# Patient Record
Sex: Female | Born: 1935
Health system: Southern US, Community
[De-identification: ages and names within clinical notes are randomized; demographics above are authoritative.]

## PROBLEM LIST (undated history)

## (undated) ENCOUNTER — Emergency Department (HOSPITAL_COMMUNITY): Admission: EM | Payer: Medicare Other | Source: Home / Self Care

## (undated) DIAGNOSIS — M810 Age-related osteoporosis without current pathological fracture: Secondary | ICD-10-CM

## (undated) DIAGNOSIS — I4891 Unspecified atrial fibrillation: Secondary | ICD-10-CM

## (undated) DIAGNOSIS — C50919 Malignant neoplasm of unspecified site of unspecified female breast: Secondary | ICD-10-CM

## (undated) DIAGNOSIS — R011 Cardiac murmur, unspecified: Secondary | ICD-10-CM

## (undated) DIAGNOSIS — I10 Essential (primary) hypertension: Secondary | ICD-10-CM

## (undated) DIAGNOSIS — Z9289 Personal history of other medical treatment: Secondary | ICD-10-CM

## (undated) DIAGNOSIS — M1990 Unspecified osteoarthritis, unspecified site: Secondary | ICD-10-CM

## (undated) DIAGNOSIS — J449 Chronic obstructive pulmonary disease, unspecified: Secondary | ICD-10-CM

## (undated) DIAGNOSIS — M4302 Spondylolysis, cervical region: Secondary | ICD-10-CM

## (undated) DIAGNOSIS — I341 Nonrheumatic mitral (valve) prolapse: Secondary | ICD-10-CM

## (undated) DIAGNOSIS — IMO0002 Reserved for concepts with insufficient information to code with codable children: Secondary | ICD-10-CM

## (undated) DIAGNOSIS — M419 Scoliosis, unspecified: Secondary | ICD-10-CM

## (undated) DIAGNOSIS — M545 Low back pain, unspecified: Secondary | ICD-10-CM

## (undated) DIAGNOSIS — E785 Hyperlipidemia, unspecified: Secondary | ICD-10-CM

## (undated) DIAGNOSIS — R0602 Shortness of breath: Secondary | ICD-10-CM

## (undated) DIAGNOSIS — G8929 Other chronic pain: Secondary | ICD-10-CM

## (undated) HISTORY — PX: HERNIA REPAIR: SHX51

## (undated) HISTORY — PX: BREAST BIOPSY: SHX20

## (undated) SURGERY — Surgical Case
Anesthesia: *Unknown

---

## 1998-09-23 HISTORY — PX: CATARACT EXTRACTION W/ INTRAOCULAR LENS IMPLANT: SHX1309

## 1998-11-23 HISTORY — PX: CARDIAC CATHETERIZATION: SHX172

## 1998-12-14 ENCOUNTER — Encounter: Payer: Self-pay | Admitting: Cardiovascular Disease

## 1998-12-14 ENCOUNTER — Inpatient Hospital Stay (HOSPITAL_COMMUNITY): Admission: AD | Admit: 1998-12-14 | Discharge: 1998-12-16 | Payer: Self-pay | Admitting: Cardiovascular Disease

## 2002-02-25 ENCOUNTER — Emergency Department (HOSPITAL_COMMUNITY): Admission: EM | Admit: 2002-02-25 | Discharge: 2002-02-26 | Payer: Self-pay | Admitting: Emergency Medicine

## 2004-03-26 ENCOUNTER — Encounter: Admission: RE | Admit: 2004-03-26 | Discharge: 2004-03-26 | Payer: Self-pay | Admitting: Internal Medicine

## 2004-03-29 ENCOUNTER — Emergency Department (HOSPITAL_COMMUNITY): Admission: EM | Admit: 2004-03-29 | Discharge: 2004-03-29 | Payer: Self-pay | Admitting: Unknown Physician Specialty

## 2004-04-20 ENCOUNTER — Encounter: Admission: RE | Admit: 2004-04-20 | Discharge: 2004-04-20 | Payer: Self-pay | Admitting: Orthopaedic Surgery

## 2004-05-04 ENCOUNTER — Encounter: Admission: RE | Admit: 2004-05-04 | Discharge: 2004-05-04 | Payer: Self-pay | Admitting: Orthopaedic Surgery

## 2004-05-05 ENCOUNTER — Encounter: Admission: RE | Admit: 2004-05-05 | Discharge: 2004-05-05 | Payer: Self-pay | Admitting: Orthopaedic Surgery

## 2004-05-29 ENCOUNTER — Encounter: Admission: RE | Admit: 2004-05-29 | Discharge: 2004-05-29 | Payer: Self-pay | Admitting: Orthopaedic Surgery

## 2004-05-31 ENCOUNTER — Encounter: Admission: RE | Admit: 2004-05-31 | Discharge: 2004-06-23 | Payer: Self-pay | Admitting: Orthopaedic Surgery

## 2004-06-13 ENCOUNTER — Encounter: Admission: RE | Admit: 2004-06-13 | Discharge: 2004-06-13 | Payer: Self-pay | Admitting: Orthopaedic Surgery

## 2004-07-05 ENCOUNTER — Encounter: Admission: RE | Admit: 2004-07-05 | Discharge: 2004-07-05 | Payer: Self-pay | Admitting: Orthopaedic Surgery

## 2005-02-16 ENCOUNTER — Encounter: Admission: RE | Admit: 2005-02-16 | Discharge: 2005-02-16 | Payer: Self-pay | Admitting: Internal Medicine

## 2005-03-07 ENCOUNTER — Encounter (INDEPENDENT_AMBULATORY_CARE_PROVIDER_SITE_OTHER): Payer: Self-pay | Admitting: Diagnostic Radiology

## 2005-03-07 ENCOUNTER — Encounter (INDEPENDENT_AMBULATORY_CARE_PROVIDER_SITE_OTHER): Payer: Self-pay | Admitting: *Deleted

## 2005-03-07 ENCOUNTER — Encounter: Admission: RE | Admit: 2005-03-07 | Discharge: 2005-03-07 | Payer: Self-pay | Admitting: Internal Medicine

## 2005-03-21 ENCOUNTER — Encounter: Admission: RE | Admit: 2005-03-21 | Discharge: 2005-03-21 | Payer: Self-pay | Admitting: Surgery

## 2005-03-23 ENCOUNTER — Ambulatory Visit: Payer: Self-pay | Admitting: Oncology

## 2005-05-10 ENCOUNTER — Ambulatory Visit (HOSPITAL_COMMUNITY): Admission: RE | Admit: 2005-05-10 | Discharge: 2005-05-10 | Payer: Self-pay | Admitting: Oncology

## 2005-05-16 ENCOUNTER — Ambulatory Visit: Payer: Self-pay | Admitting: Oncology

## 2005-05-17 LAB — COMPREHENSIVE METABOLIC PANEL
AST: 18 U/L (ref 0–37)
Albumin: 4.4 g/dL (ref 3.5–5.2)
Alkaline Phosphatase: 51 U/L (ref 39–117)
BUN: 18 mg/dL (ref 6–23)
Creatinine, Ser: 0.9 mg/dL (ref 0.4–1.2)
Glucose, Bld: 97 mg/dL (ref 70–99)
Potassium: 4.4 mEq/L (ref 3.5–5.3)
Total Bilirubin: 0.5 mg/dL (ref 0.3–1.2)

## 2005-05-17 LAB — CBC WITH DIFFERENTIAL/PLATELET
Basophils Absolute: 0.1 10*3/uL (ref 0.0–0.1)
EOS%: 5.9 % (ref 0.0–7.0)
Eosinophils Absolute: 0.3 10*3/uL (ref 0.0–0.5)
HCT: 37.5 % (ref 34.8–46.6)
HGB: 12.7 g/dL (ref 11.6–15.9)
LYMPH%: 21.1 % (ref 14.0–48.0)
MCH: 33.8 pg (ref 26.0–34.0)
MCV: 99.8 fL (ref 81.0–101.0)
MONO%: 9.8 % (ref 0.0–13.0)
NEUT#: 3.1 10*3/uL (ref 1.5–6.5)
NEUT%: 61.9 % (ref 39.6–76.8)
Platelets: 270 10*3/uL (ref 145–400)

## 2005-05-17 LAB — CANCER ANTIGEN 27.29: CA 27.29: 9 U/mL (ref 0–39)

## 2005-07-10 ENCOUNTER — Ambulatory Visit: Payer: Self-pay | Admitting: Oncology

## 2005-07-12 LAB — CBC WITH DIFFERENTIAL/PLATELET
BASO%: 0.8 % (ref 0.0–2.0)
Eosinophils Absolute: 0.2 10*3/uL (ref 0.0–0.5)
MCHC: 34.5 g/dL (ref 32.0–36.0)
MONO#: 0.5 10*3/uL (ref 0.1–0.9)
NEUT#: 3.3 10*3/uL (ref 1.5–6.5)
RBC: 3.68 10*6/uL — ABNORMAL LOW (ref 3.70–5.32)
RDW: 13.1 % (ref 11.3–14.5)
WBC: 5.1 10*3/uL (ref 3.9–10.0)
lymph#: 0.9 10*3/uL (ref 0.9–3.3)

## 2005-07-12 LAB — COMPREHENSIVE METABOLIC PANEL
ALT: 12 U/L (ref 0–40)
Albumin: 4.5 g/dL (ref 3.5–5.2)
CO2: 30 mEq/L (ref 19–32)
Calcium: 9.6 mg/dL (ref 8.4–10.5)
Chloride: 100 mEq/L (ref 96–112)
Glucose, Bld: 95 mg/dL (ref 70–99)
Potassium: 4.5 mEq/L (ref 3.5–5.3)
Sodium: 138 mEq/L (ref 135–145)
Total Bilirubin: 0.6 mg/dL (ref 0.3–1.2)
Total Protein: 6.8 g/dL (ref 6.0–8.3)

## 2005-07-12 LAB — LACTATE DEHYDROGENASE: LDH: 159 U/L (ref 94–250)

## 2005-07-19 ENCOUNTER — Encounter: Admission: RE | Admit: 2005-07-19 | Discharge: 2005-07-19 | Payer: Self-pay | Admitting: General Surgery

## 2005-10-09 ENCOUNTER — Ambulatory Visit: Payer: Self-pay | Admitting: Oncology

## 2005-10-11 LAB — COMPREHENSIVE METABOLIC PANEL
Albumin: 4.7 g/dL (ref 3.5–5.2)
Alkaline Phosphatase: 53 U/L (ref 39–117)
BUN: 16 mg/dL (ref 6–23)
CO2: 29 mEq/L (ref 19–32)
Glucose, Bld: 102 mg/dL — ABNORMAL HIGH (ref 70–99)
Potassium: 4.6 mEq/L (ref 3.5–5.3)
Sodium: 138 mEq/L (ref 135–145)
Total Bilirubin: 0.5 mg/dL (ref 0.3–1.2)
Total Protein: 6.8 g/dL (ref 6.0–8.3)

## 2005-10-11 LAB — CBC WITH DIFFERENTIAL/PLATELET
BASO%: 1.1 % (ref 0.0–2.0)
EOS%: 5.9 % (ref 0.0–7.0)
HCT: 37.3 % (ref 34.8–46.6)
LYMPH%: 19.4 % (ref 14.0–48.0)
MCH: 34.2 pg — ABNORMAL HIGH (ref 26.0–34.0)
MCHC: 34.3 g/dL (ref 32.0–36.0)
MONO#: 0.4 10*3/uL (ref 0.1–0.9)
MONO%: 8.2 % (ref 0.0–13.0)
NEUT%: 65.4 % (ref 39.6–76.8)
Platelets: 283 10*3/uL (ref 145–400)
RBC: 3.74 10*6/uL (ref 3.70–5.32)
WBC: 5.4 10*3/uL (ref 3.9–10.0)

## 2005-10-11 LAB — CANCER ANTIGEN 27.29: CA 27.29: <4 U/mL (ref 0–39)

## 2005-10-11 LAB — LACTATE DEHYDROGENASE: LDH: 155 U/L (ref 94–250)

## 2006-01-01 ENCOUNTER — Ambulatory Visit: Payer: Self-pay | Admitting: Oncology

## 2006-01-03 LAB — CBC WITH DIFFERENTIAL/PLATELET
Basophils Absolute: 0 10*3/uL (ref 0.0–0.1)
Eosinophils Absolute: 0.1 10*3/uL (ref 0.0–0.5)
HGB: 12.4 g/dL (ref 11.6–15.9)
NEUT#: 3.7 10*3/uL (ref 1.5–6.5)
RBC: 3.69 10*6/uL — ABNORMAL LOW (ref 3.70–5.32)
RDW: 13 % (ref 11.3–14.5)
WBC: 5.8 10*3/uL (ref 3.9–10.0)
lymph#: 1.4 10*3/uL (ref 0.9–3.3)

## 2006-01-03 LAB — COMPREHENSIVE METABOLIC PANEL
AST: 18 U/L (ref 0–37)
Albumin: 4.7 g/dL (ref 3.5–5.2)
Alkaline Phosphatase: 68 U/L (ref 39–117)
BUN: 18 mg/dL (ref 6–23)
Calcium: 9.4 mg/dL (ref 8.4–10.5)
Chloride: 102 mEq/L (ref 96–112)
Glucose, Bld: 91 mg/dL (ref 70–99)
Potassium: 4.4 mEq/L (ref 3.5–5.3)
Sodium: 140 mEq/L (ref 135–145)
Total Protein: 6.7 g/dL (ref 6.0–8.3)

## 2006-02-18 ENCOUNTER — Encounter: Admission: RE | Admit: 2006-02-18 | Discharge: 2006-02-18 | Payer: Self-pay | Admitting: Oncology

## 2006-04-01 ENCOUNTER — Ambulatory Visit: Payer: Self-pay | Admitting: Oncology

## 2006-04-03 LAB — CBC WITH DIFFERENTIAL/PLATELET
BASO%: 0.7 % (ref 0.0–2.0)
Eosinophils Absolute: 0 10*3/uL (ref 0.0–0.5)
HCT: 36.2 % (ref 34.8–46.6)
MCHC: 34.7 g/dL (ref 32.0–36.0)
MONO#: 0.5 10*3/uL (ref 0.1–0.9)
NEUT#: 3 10*3/uL (ref 1.5–6.5)
NEUT%: 66.3 % (ref 39.6–76.8)
Platelets: 212 10*3/uL (ref 145–400)
WBC: 4.5 10*3/uL (ref 3.9–10.0)
lymph#: 0.9 10*3/uL (ref 0.9–3.3)

## 2006-04-05 LAB — COMPREHENSIVE METABOLIC PANEL
ALT: 13 U/L (ref 0–35)
CO2: 30 mEq/L (ref 19–32)
Calcium: 9.3 mg/dL (ref 8.4–10.5)
Chloride: 103 mEq/L (ref 96–112)
Creatinine, Ser: 0.85 mg/dL (ref 0.40–1.20)
Glucose, Bld: 93 mg/dL (ref 70–99)
Sodium: 142 mEq/L (ref 135–145)
Total Protein: 7 g/dL (ref 6.0–8.3)

## 2006-08-05 ENCOUNTER — Ambulatory Visit: Payer: Self-pay | Admitting: Oncology

## 2006-08-07 LAB — CBC WITH DIFFERENTIAL/PLATELET
BASO%: 0.3 % (ref 0.0–2.0)
Basophils Absolute: 0 10*3/uL (ref 0.0–0.1)
EOS%: 1.5 % (ref 0.0–7.0)
HCT: 36.6 % (ref 34.8–46.6)
LYMPH%: 20.6 % (ref 14.0–48.0)
MCH: 34.3 pg — ABNORMAL HIGH (ref 26.0–34.0)
MCHC: 35.3 g/dL (ref 32.0–36.0)
NEUT%: 70 % (ref 39.6–76.8)
Platelets: 219 10*3/uL (ref 145–400)

## 2006-08-07 LAB — LACTATE DEHYDROGENASE: LDH: 167 U/L (ref 94–250)

## 2006-08-07 LAB — COMPREHENSIVE METABOLIC PANEL
AST: 17 U/L (ref 0–37)
Alkaline Phosphatase: 55 U/L (ref 39–117)
BUN: 14 mg/dL (ref 6–23)
Calcium: 9.6 mg/dL (ref 8.4–10.5)
Chloride: 96 mEq/L (ref 96–112)
Creatinine, Ser: 0.85 mg/dL (ref 0.40–1.20)
Glucose, Bld: 91 mg/dL (ref 70–99)

## 2006-09-24 ENCOUNTER — Encounter: Admission: RE | Admit: 2006-09-24 | Discharge: 2006-09-24 | Payer: Self-pay | Admitting: Internal Medicine

## 2006-11-27 ENCOUNTER — Ambulatory Visit: Payer: Self-pay | Admitting: Oncology

## 2006-12-09 ENCOUNTER — Inpatient Hospital Stay (HOSPITAL_COMMUNITY): Admission: EM | Admit: 2006-12-09 | Discharge: 2006-12-16 | Payer: Self-pay | Admitting: Emergency Medicine

## 2006-12-10 ENCOUNTER — Encounter (INDEPENDENT_AMBULATORY_CARE_PROVIDER_SITE_OTHER): Payer: Self-pay | Admitting: Gastroenterology

## 2006-12-26 LAB — COMPREHENSIVE METABOLIC PANEL
ALT: 10 U/L (ref 0–35)
AST: 16 U/L (ref 0–37)
Albumin: 4.3 g/dL (ref 3.5–5.2)
Calcium: 10 mg/dL (ref 8.4–10.5)
Chloride: 100 mEq/L (ref 96–112)
Potassium: 4.1 mEq/L (ref 3.5–5.3)
Total Protein: 6.8 g/dL (ref 6.0–8.3)

## 2006-12-26 LAB — CBC WITH DIFFERENTIAL/PLATELET
BASO%: 0.4 % (ref 0.0–2.0)
EOS%: 1.7 % (ref 0.0–7.0)
HGB: 12.3 g/dL (ref 11.6–15.9)
MCH: 33.7 pg (ref 26.0–34.0)
MCHC: 34.4 g/dL (ref 32.0–36.0)
MONO#: 0.5 10*3/uL (ref 0.1–0.9)
RDW: 14.1 % (ref 11.3–14.5)
WBC: 6.1 10*3/uL (ref 3.9–10.0)
lymph#: 0.9 10*3/uL (ref 0.9–3.3)

## 2007-02-20 ENCOUNTER — Encounter: Admission: RE | Admit: 2007-02-20 | Discharge: 2007-02-20 | Payer: Self-pay | Admitting: Oncology

## 2007-02-25 ENCOUNTER — Ambulatory Visit: Payer: Self-pay | Admitting: Oncology

## 2007-02-27 LAB — CBC WITH DIFFERENTIAL/PLATELET
Basophils Absolute: 0.1 10*3/uL (ref 0.0–0.1)
EOS%: 2.7 % (ref 0.0–7.0)
HCT: 37.5 % (ref 34.8–46.6)
HGB: 13.1 g/dL (ref 11.6–15.9)
MCH: 33.8 pg (ref 26.0–34.0)
MCV: 96.6 fL (ref 81.0–101.0)
MONO%: 10.2 % (ref 0.0–13.0)
NEUT%: 62.8 % (ref 39.6–76.8)
Platelets: 238 10*3/uL (ref 145–400)
lymph#: 1.3 10*3/uL (ref 0.9–3.3)

## 2007-02-28 LAB — COMPREHENSIVE METABOLIC PANEL
ALT: 9 U/L (ref 0–35)
AST: 16 U/L (ref 0–37)
Albumin: 5 g/dL (ref 3.5–5.2)
Alkaline Phosphatase: 71 U/L (ref 39–117)
Potassium: 3.9 mEq/L (ref 3.5–5.3)
Sodium: 136 mEq/L (ref 135–145)
Total Protein: 7.3 g/dL (ref 6.0–8.3)

## 2007-02-28 LAB — VITAMIN D 25 HYDROXY (VIT D DEFICIENCY, FRACTURES): Vit D, 25-Hydroxy: 40 ng/mL (ref 30–89)

## 2007-05-19 ENCOUNTER — Ambulatory Visit: Payer: Self-pay | Admitting: Oncology

## 2007-05-21 LAB — CBC WITH DIFFERENTIAL/PLATELET
EOS%: 4.9 % (ref 0.0–7.0)
Eosinophils Absolute: 0.3 10*3/uL (ref 0.0–0.5)
MCH: 33.4 pg (ref 26.0–34.0)
MCV: 95.8 fL (ref 81.0–101.0)
MONO%: 10.4 % (ref 0.0–13.0)
NEUT#: 3.1 10*3/uL (ref 1.5–6.5)
RBC: 3.65 10*6/uL — ABNORMAL LOW (ref 3.70–5.32)
RDW: 13.5 % (ref 11.3–14.5)

## 2007-05-22 LAB — COMPREHENSIVE METABOLIC PANEL
ALT: 11 U/L (ref 0–35)
AST: 18 U/L (ref 0–37)
Albumin: 4.3 g/dL (ref 3.5–5.2)
Alkaline Phosphatase: 76 U/L (ref 39–117)
Potassium: 4.2 mEq/L (ref 3.5–5.3)
Sodium: 139 mEq/L (ref 135–145)
Total Protein: 6.8 g/dL (ref 6.0–8.3)

## 2007-10-10 ENCOUNTER — Ambulatory Visit: Payer: Self-pay | Admitting: Oncology

## 2007-10-14 LAB — CBC WITH DIFFERENTIAL/PLATELET
BASO%: 0.6 % (ref 0.0–2.0)
EOS%: 3.6 % (ref 0.0–7.0)
HCT: 38.9 % (ref 34.8–46.6)
LYMPH%: 17.7 % (ref 14.0–48.0)
MCH: 34.7 pg — ABNORMAL HIGH (ref 26.0–34.0)
MCHC: 34.4 g/dL (ref 32.0–36.0)
MONO#: 0.4 10*3/uL (ref 0.1–0.9)
NEUT%: 70.3 % (ref 39.6–76.8)
Platelets: 225 10*3/uL (ref 145–400)
RBC: 3.86 10*6/uL (ref 3.70–5.32)
WBC: 5.7 10*3/uL (ref 3.9–10.0)

## 2007-10-15 LAB — COMPREHENSIVE METABOLIC PANEL
ALT: 13 U/L (ref 0–35)
AST: 17 U/L (ref 0–37)
Alkaline Phosphatase: 79 U/L (ref 39–117)
CO2: 28 mEq/L (ref 19–32)
Creatinine, Ser: 0.85 mg/dL (ref 0.40–1.20)
Sodium: 138 mEq/L (ref 135–145)
Total Bilirubin: 0.8 mg/dL (ref 0.3–1.2)
Total Protein: 7.3 g/dL (ref 6.0–8.3)

## 2008-02-23 ENCOUNTER — Ambulatory Visit: Payer: Self-pay | Admitting: Oncology

## 2008-02-25 LAB — CBC WITH DIFFERENTIAL/PLATELET
Basophils Absolute: 0 10*3/uL (ref 0.0–0.1)
Eosinophils Absolute: 0.3 10*3/uL (ref 0.0–0.5)
HCT: 36.6 % (ref 34.8–46.6)
HGB: 12.6 g/dL (ref 11.6–15.9)
MONO#: 0.4 10*3/uL (ref 0.1–0.9)
NEUT#: 3.4 10*3/uL (ref 1.5–6.5)
NEUT%: 67.1 % (ref 39.6–76.8)
RDW: 13.2 % (ref 11.3–14.5)
WBC: 5 10*3/uL (ref 3.9–10.0)
lymph#: 0.9 10*3/uL (ref 0.9–3.3)

## 2008-02-26 ENCOUNTER — Encounter: Admission: RE | Admit: 2008-02-26 | Discharge: 2008-02-26 | Payer: Self-pay | Admitting: Oncology

## 2008-02-26 LAB — COMPREHENSIVE METABOLIC PANEL
ALT: 14 U/L (ref 0–35)
AST: 18 U/L (ref 0–37)
Albumin: 4.6 g/dL (ref 3.5–5.2)
BUN: 21 mg/dL (ref 6–23)
CO2: 26 mEq/L (ref 19–32)
Calcium: 9.9 mg/dL (ref 8.4–10.5)
Chloride: 100 mEq/L (ref 96–112)
Creatinine, Ser: 0.97 mg/dL (ref 0.40–1.20)
Potassium: 4 mEq/L (ref 3.5–5.3)

## 2008-02-26 LAB — VITAMIN D 25 HYDROXY (VIT D DEFICIENCY, FRACTURES): Vit D, 25-Hydroxy: 29 ng/mL — ABNORMAL LOW (ref 30–89)

## 2008-02-26 LAB — CANCER ANTIGEN 27.29: CA 27.29: 15 U/mL (ref 0–39)

## 2008-09-08 ENCOUNTER — Ambulatory Visit: Payer: Self-pay | Admitting: Oncology

## 2008-09-10 LAB — CBC WITH DIFFERENTIAL/PLATELET
Basophils Absolute: 0.1 10*3/uL (ref 0.0–0.1)
EOS%: 6.4 % (ref 0.0–7.0)
HGB: 12.1 g/dL (ref 11.6–15.9)
LYMPH%: 19.6 % (ref 14.0–49.7)
MCH: 34.7 pg — ABNORMAL HIGH (ref 25.1–34.0)
MCV: 102.2 fL — ABNORMAL HIGH (ref 79.5–101.0)
MONO%: 9.8 % (ref 0.0–14.0)
Platelets: 181 10*3/uL (ref 145–400)
RDW: 13.1 % (ref 11.2–14.5)

## 2008-09-14 LAB — COMPREHENSIVE METABOLIC PANEL
AST: 22 U/L (ref 0–37)
Albumin: 4.6 g/dL (ref 3.5–5.2)
Alkaline Phosphatase: 64 U/L (ref 39–117)
BUN: 17 mg/dL (ref 6–23)
Creatinine, Ser: 1.01 mg/dL (ref 0.40–1.20)
Potassium: 3.7 mEq/L (ref 3.5–5.3)
Total Bilirubin: 0.6 mg/dL (ref 0.3–1.2)

## 2008-09-14 LAB — VITAMIN D 25 HYDROXY (VIT D DEFICIENCY, FRACTURES): Vit D, 25-Hydroxy: 18 ng/mL — ABNORMAL LOW (ref 30–89)

## 2008-09-14 LAB — CANCER ANTIGEN 27.29: CA 27.29: 13 U/mL (ref 0–39)

## 2009-02-28 ENCOUNTER — Encounter: Admission: RE | Admit: 2009-02-28 | Discharge: 2009-02-28 | Payer: Self-pay | Admitting: Oncology

## 2009-03-15 ENCOUNTER — Ambulatory Visit: Payer: Self-pay | Admitting: Oncology

## 2009-03-17 LAB — CBC WITH DIFFERENTIAL/PLATELET
BASO%: 0.9 % (ref 0.0–2.0)
Eosinophils Absolute: 0.4 10*3/uL (ref 0.0–0.5)
MCHC: 34 g/dL (ref 31.5–36.0)
MONO#: 0.5 10*3/uL (ref 0.1–0.9)
NEUT#: 3.6 10*3/uL (ref 1.5–6.5)
RBC: 3.67 10*6/uL — ABNORMAL LOW (ref 3.70–5.45)
RDW: 13.4 % (ref 11.2–14.5)
WBC: 5.6 10*3/uL (ref 3.9–10.3)
lymph#: 1.1 10*3/uL (ref 0.9–3.3)

## 2009-03-17 LAB — COMPREHENSIVE METABOLIC PANEL
ALT: 14 U/L (ref 0–35)
Albumin: 4.9 g/dL (ref 3.5–5.2)
CO2: 28 mEq/L (ref 19–32)
Chloride: 98 mEq/L (ref 96–112)
Glucose, Bld: 99 mg/dL (ref 70–99)
Potassium: 4.4 mEq/L (ref 3.5–5.3)
Sodium: 136 mEq/L (ref 135–145)
Total Bilirubin: 0.7 mg/dL (ref 0.3–1.2)
Total Protein: 7.3 g/dL (ref 6.0–8.3)

## 2009-03-17 LAB — LACTATE DEHYDROGENASE: LDH: 165 U/L (ref 94–250)

## 2009-04-20 ENCOUNTER — Emergency Department (HOSPITAL_COMMUNITY): Admission: EM | Admit: 2009-04-20 | Discharge: 2009-04-20 | Payer: Self-pay | Admitting: Emergency Medicine

## 2009-09-22 ENCOUNTER — Ambulatory Visit: Payer: Self-pay | Admitting: Oncology

## 2009-09-27 LAB — COMPREHENSIVE METABOLIC PANEL
AST: 22 U/L (ref 0–37)
Albumin: 4.5 g/dL (ref 3.5–5.2)
BUN: 15 mg/dL (ref 6–23)
Calcium: 9.9 mg/dL (ref 8.4–10.5)
Chloride: 92 mEq/L — ABNORMAL LOW (ref 96–112)
Creatinine, Ser: 1.02 mg/dL (ref 0.40–1.20)
Glucose, Bld: 93 mg/dL (ref 70–99)
Potassium: 3.9 mEq/L (ref 3.5–5.3)

## 2009-09-27 LAB — CBC WITH DIFFERENTIAL/PLATELET
Basophils Absolute: 0 10*3/uL (ref 0.0–0.1)
EOS%: 2 % (ref 0.0–7.0)
Eosinophils Absolute: 0.1 10*3/uL (ref 0.0–0.5)
HCT: 38.5 % (ref 34.8–46.6)
HGB: 13 g/dL (ref 11.6–15.9)
MCH: 34 pg (ref 25.1–34.0)
MCV: 100.7 fL (ref 79.5–101.0)
MONO%: 9.1 % (ref 0.0–14.0)
NEUT#: 4.4 10*3/uL (ref 1.5–6.5)
NEUT%: 70 % (ref 38.4–76.8)
RDW: 14 % (ref 11.2–14.5)
lymph#: 1.2 10*3/uL (ref 0.9–3.3)

## 2009-09-27 LAB — VITAMIN D 25 HYDROXY (VIT D DEFICIENCY, FRACTURES): Vit D, 25-Hydroxy: 50 ng/mL (ref 30–89)

## 2009-10-03 ENCOUNTER — Encounter: Admission: RE | Admit: 2009-10-03 | Discharge: 2009-10-03 | Payer: Self-pay | Admitting: Oncology

## 2010-01-10 ENCOUNTER — Observation Stay (HOSPITAL_COMMUNITY)
Admission: EM | Admit: 2010-01-10 | Discharge: 2010-01-11 | Payer: Self-pay | Source: Home / Self Care | Attending: Internal Medicine | Admitting: Internal Medicine

## 2010-02-11 ENCOUNTER — Other Ambulatory Visit: Payer: Self-pay | Admitting: Oncology

## 2010-02-11 DIAGNOSIS — Z9889 Other specified postprocedural states: Secondary | ICD-10-CM

## 2010-02-11 DIAGNOSIS — N6320 Unspecified lump in the left breast, unspecified quadrant: Secondary | ICD-10-CM

## 2010-03-01 ENCOUNTER — Other Ambulatory Visit: Payer: Self-pay

## 2010-03-01 ENCOUNTER — Ambulatory Visit
Admission: RE | Admit: 2010-03-01 | Discharge: 2010-03-01 | Disposition: A | Discharge status: Home / Self Care | Payer: Medicare Other | Source: Ambulatory Visit | Attending: Oncology | Admitting: Oncology

## 2010-03-01 DIAGNOSIS — N6320 Unspecified lump in the left breast, unspecified quadrant: Secondary | ICD-10-CM

## 2010-03-01 DIAGNOSIS — Z9889 Other specified postprocedural states: Secondary | ICD-10-CM

## 2010-04-03 LAB — URINE MICROSCOPIC-ADD ON

## 2010-04-03 LAB — DIFFERENTIAL
Basophils Absolute: 0 10*3/uL (ref 0.0–0.1)
Lymphocytes Relative: 13 % (ref 12–46)
Neutro Abs: 4.7 10*3/uL (ref 1.7–7.7)

## 2010-04-03 LAB — CK TOTAL AND CKMB (NOT AT ARMC)
CK, MB: 2.1 ng/mL (ref 0.3–4.0)
Total CK: 54 U/L (ref 7–177)

## 2010-04-03 LAB — BASIC METABOLIC PANEL
Calcium: 10.8 mg/dL — ABNORMAL HIGH (ref 8.4–10.5)
Calcium: 8.9 mg/dL (ref 8.4–10.5)
Chloride: 89 mEq/L — ABNORMAL LOW (ref 96–112)
Creatinine, Ser: 0.89 mg/dL (ref 0.4–1.2)
Creatinine, Ser: 0.92 mg/dL (ref 0.4–1.2)
Sodium: 131 mEq/L — ABNORMAL LOW (ref 135–145)

## 2010-04-03 LAB — URINALYSIS, ROUTINE W REFLEX MICROSCOPIC
Bilirubin Urine: NEGATIVE
Glucose, UA: NEGATIVE mg/dL
Protein, ur: NEGATIVE mg/dL

## 2010-04-03 LAB — CBC
MCV: 99.7 fL (ref 78.0–100.0)
Platelets: 166 10*3/uL (ref 150–400)
Platelets: 213 10*3/uL (ref 150–400)
RDW: 12.3 % (ref 11.5–15.5)
RDW: 12.3 % (ref 11.5–15.5)
WBC: 4.3 10*3/uL (ref 4.0–10.5)
WBC: 6.1 10*3/uL (ref 4.0–10.5)

## 2010-04-03 LAB — TROPONIN I

## 2010-04-14 ENCOUNTER — Other Ambulatory Visit: Payer: Self-pay | Admitting: Oncology

## 2010-04-14 ENCOUNTER — Encounter (HOSPITAL_BASED_OUTPATIENT_CLINIC_OR_DEPARTMENT_OTHER): Payer: Medicare Other | Admitting: Oncology

## 2010-04-14 DIAGNOSIS — Z853 Personal history of malignant neoplasm of breast: Secondary | ICD-10-CM

## 2010-04-14 DIAGNOSIS — C50419 Malignant neoplasm of upper-outer quadrant of unspecified female breast: Secondary | ICD-10-CM

## 2010-04-14 LAB — CBC WITH DIFFERENTIAL/PLATELET
BASO%: 0.5 % (ref 0.0–2.0)
Basophils Absolute: 0 10*3/uL (ref 0.0–0.1)
EOS%: 2.5 % (ref 0.0–7.0)
HGB: 12.7 g/dL (ref 11.6–15.9)
MCH: 33.5 pg (ref 25.1–34.0)
NEUT#: 4.6 10*3/uL (ref 1.5–6.5)
RDW: 13.6 % (ref 11.2–14.5)
lymph#: 1.3 10*3/uL (ref 0.9–3.3)

## 2010-04-15 LAB — COMPREHENSIVE METABOLIC PANEL
ALT: 14 U/L (ref 0–35)
AST: 24 U/L (ref 0–37)
Albumin: 4.7 g/dL (ref 3.5–5.2)
BUN: 15 mg/dL (ref 6–23)
Calcium: 10.1 mg/dL (ref 8.4–10.5)
Chloride: 96 mEq/L (ref 96–112)
Potassium: 3.7 mEq/L (ref 3.5–5.3)
Sodium: 137 mEq/L (ref 135–145)
Total Protein: 7.3 g/dL (ref 6.0–8.3)

## 2010-04-16 LAB — CBC
Hemoglobin: 13.1 g/dL (ref 12.0–15.0)
MCHC: 34 g/dL (ref 30.0–36.0)
RDW: 13.4 % (ref 11.5–15.5)

## 2010-04-16 LAB — DIFFERENTIAL
Lymphs Abs: 0.9 10*3/uL (ref 0.7–4.0)
Monocytes Relative: 9 % (ref 3–12)
Neutro Abs: 9 10*3/uL — ABNORMAL HIGH (ref 1.7–7.7)
Neutrophils Relative %: 82 % — ABNORMAL HIGH (ref 43–77)

## 2010-04-16 LAB — URINALYSIS, ROUTINE W REFLEX MICROSCOPIC
Glucose, UA: NEGATIVE mg/dL
Specific Gravity, Urine: 1.012 (ref 1.005–1.030)
pH: 7.5 (ref 5.0–8.0)

## 2010-04-16 LAB — COMPREHENSIVE METABOLIC PANEL
ALT: 17 U/L (ref 0–35)
BUN: 13 mg/dL (ref 6–23)
Calcium: 9.7 mg/dL (ref 8.4–10.5)
Glucose, Bld: 125 mg/dL — ABNORMAL HIGH (ref 70–99)
Sodium: 132 mEq/L — ABNORMAL LOW (ref 135–145)
Total Protein: 7.5 g/dL (ref 6.0–8.3)

## 2010-04-16 LAB — CULTURE, BLOOD (ROUTINE X 2)
Culture: NO GROWTH
Culture: NO GROWTH

## 2010-04-16 LAB — URINE CULTURE

## 2010-04-16 LAB — URINE MICROSCOPIC-ADD ON

## 2010-06-06 NOTE — H&P (Signed)
Holly Weaver, GLASBY NO.:  [id number]   MEDICAL RECORD NO.:  [id number]          PATIENT TYPE:  EMS   LOCATION:  ED                           FACILITY:  [facility]   PHYSICIAN:  Janalyn Rouse, M.D.  DATE OF BIRTH:  1923-09-27   DATE OF ADMISSION:  12/09/2006  DATE OF DISCHARGE:                              HISTORY & PHYSICAL   CHIEF COMPLAINT:  Bright red blood per rectum.   HISTORY OF PRESENT ILLNESS:  Holly Weaver is an 77 year old white female  with a history of breast cancer on Femara therapy, osteoporosis with  multiple vertebral fractures resulting in chronic back pain who  presented to the emergency department today with a complaint of bright  red blood per rectum for less than 12 hours.  The patient states that  she has had increasing back pain recently due to compression fractures  and has been treated with increasing Darvocet usage.  This morning,  after awakening, she had crampy lower abdominal pain and constipation.  She took milk of magnesia with resulting hard stool for which she had to  strain.  This was followed by an even larger loose stool and then  several loose bowel movements with bloody clots throughout the day.  She  called our office and presented to the emergency department where she  was found to have heme-positive stool with bright red blood.  She does  not have any prior history of GI bleed and has not had any recent  significant GI issues.  Of note, she has never had a colonoscopy despite  her family history of colon cancer due to her recent issues including  her back pain and breast cancer.  She has not felt that she could lay  flat for this exam.   REVIEW OF SYSTEMS:  All systems were reviewed with the patient and were  negative except in the HPI with the following exceptions:  She has lost  a good bit of weight recently, but this has stabilized since September  of 2008.  Prior to that, she lost about 12 pounds.  No fevers, chills  or  sweats.   PAST MEDICAL HISTORY:  1. Osteoporosis with multiple compression fractures including T11,      T12, and L1 in 2006 followed by T11 vertebroplasty.  A recent T10      compression fracture (September of 2008).  2. Chronic back pain secondary to #1.  3. Hypertension.  4. Renal artery stenosis.  5. Hyperlipidemia.  6. Breast cancer (February of 2007) on Femara therapy.  Has not had      surgery or radiation.  7. Moderate pulmonary hypertension.  8. Nonobstructive coronary artery disease (Cath in 2000).   MEDICATIONS:  1. Altace 20 mg daily.  2. Lipitor 20 mg daily.  3. Metoprolol 50 mg b.i.d.  4. Hydrochlorothiazide 25 mg daily.  5. Prevacid 30 mg daily.  6. Forte 20 micrograms daily.  7. Ergocalciferol 50,000 units weekly.  8. Femara 2.5 mg daily.  9. Norvasc 5 mg daily.  10.Celebrex 200 mg daily.  11.Darvocet-N 100 p.r.n.   ALLERGIES:  1. PENICILLIN.  2. CODEINE.  3. SOMA.  4. STADOL.   SOCIAL HISTORY:  She is divorced and lives alone.  Her daughter helps  significantly at the house.  She is retired from a job as Interior and spatial designer since 1986.  No tobacco, alcohol or drug use.   FAMILY HISTORY:  Mother died of colon cancer at 42.  She also had a  sister with lung cancer.   PHYSICAL EXAMINATION:  VITAL SIGNS:  Temperature 96.6, blood pressure  114/63, pulse 78, respirations 18, oxygen saturation 93% on room air.  GENERAL:  Pleasant, in no acute distress.  HEENT:  Oropharynx is moist.  No scleral icterus.  NECK:  Supple without lymphadenopathy, JVD or carotid bruits.  HEART:  Regular rate and rhythm with occasional ectopy.  LUNGS:  Clear to auscultation bilaterally.  ABDOMEN:  Soft, nondistended with normoactive bowel sounds.  She does  have left lower quadrant tenderness to palpation.  No rebound or  guarding.  RECTAL:  Small hemorrhoids which are not bleeding.  She has grossly  bloody stool of jelly consistency on gloved finger exam proximal to the   rectum.  No fissure seen.  NEUROLOGIC:  Alert and oriented x4.   LABORATORY DATA:  CBC shows a white count of 14.0, hemoglobin 14.3,  platelets 232.  BMET significant for sodium 135, potassium 3.5, chloride  96, bicarbonate 29, BUN 25, creatinine 1.0, glucose 161.  Urinalysis  shows 3-6 white blood cells.  Liver function tests were normal.   STUDIES:  Acute abdominal series were personally reviewed.  She has no  acute cardiopulmonary disease with no significant abnormalities.   ASSESSMENT AND PLAN:  1. Lower gastrointestinal bleed - the findings on exam are concerning      for lower gastrointestinal bleed (diverticulosis or polyp being the      most likely.  Less likely hemorrhoidal due to constipation and hard      stool, as the bleeding source seems to be proximal to the rectum.      Doubt ischemia).  Will admit for serial hemoglobins, hematocrits      and gentle hydration.  GI consult to consider inpatient evaluation      with a flexible sigmoidoscopy versus colonoscopy versus outpatient      colonoscopy if her hemoglobin remains stable.  I discussed this      with Dr. Cristina Gong who plans to see her in the morning or sooner if      her condition changes.  2. Chronic back pain - will continue Darvocet-N 100.  Hold Celebrex      secondary to #1.  Will help with constipation symptoms once her      symptoms have stabilized.  3. Breast cancer - continue Femara.  4. Hypertension - will continue her home medications with a decrease      in her Altace dose for now.  Will hold for      blood pressure less than 100/50.  Monitor blood pressure closely      bleeding.  5. Fluids, electrolytes, and nutrition - full liquid diet due to      possible evaluation tomorrow.  6. Deep vein thrombosis prophylaxis - SCDs.      Janalyn Rouse, M.D.  Electronically Signed     WS/MEDQ  D:  12/09/2006  T:  12/10/2006  Job:  [id number]   cc:   Delfino Lovett A. Rollene Fare, M.D.  Fax: [place].  Rolla Flatten., M.D.  Fax: [phone]

## 2010-06-06 NOTE — Consult Note (Signed)
Holly Weaver, Holly Weaver NO.:  [id number]   MEDICAL RECORD NO.:  [id number]          PATIENT TYPE:  INP   LOCATION:  80                         FACILITY:  [facility]   PHYSICIAN:  Holly Weaver, M.D.   DATE OF BIRTH:  08/27/1923   DATE OF CONSULTATION:  DATE OF DISCHARGE:                                 CONSULTATION   GASTROENTEROLOGY CONSULTATION   Dr. Brigitte Pulse asked me see this 77 year old female, because of rectal  bleeding.   Holly Weaver is known to my partner, Dr. Oletta Lamas, who apparently did an  office sigmoidoscopy on her many years ago.  She has never had a full  colonoscopy.  There is a family history of colon cancer in her younger  brother.  With that background, she has never had any rectal bleeding in  the past, but yesterday, was straining at stool due to constipation-  related to chronic pain medications, because of osteoporotic back pain.  She passed numerous pellet-like hard stools that were nonbloody, then  had subsequent to that another bowel movement, and finally on the third  bowel movement (having taken some Milk of Magnesia antecedently), had a  blow out of liquid stool that was brown in color but admixed with  blood.  Since then, she has had a several additional bloody bowel  movements, the most recent having been at 9:30 last night.  She was  brought into the hospital but has not had further bleeding through the  night.  Her baseline hemoglobin is 14, and on admission here it was  stable at 14.3, but overnight with IV hydration it has fallen to 12.1.  BUN on admission was 25 and has fallen to 18.  The patient did not have  perianal pain, in association with this rectal bleeding.   PAST MEDICAL HISTORY:  MULTIPLE MEDICATION ALLERGIES INCLUDING SOMA,  CODEINE, PERCOCET, STADOL AND PROCARDIA.   Holly Weaver:  Include Norvasc, Femara, Lopressor,  Protonix, Altace and morphine.  Apparently, she uses Darvocet at home  for pain  control, as well as Celebrex.   OPERATIONS:  Hernia repair.   MEDICAL ILLNESSES:  Include osteoporosis with multiple compression  fractures, status post vertebroplasty, history of breast cancer on  Femara, chronic back pain, and scoliosis.   FAMILY HISTORY:  Pertinent for colon cancer in her brother.   SOCIAL HISTORY:  Divorced, lives alone, previously worked at [employer]  Daughter, Holly Weaver, is at the bedside.   REVIEW OF SYSTEMS:  Tendency toward constipation, as noted above.  In  the past, had used fiber supplements, but eventually had been able to  get by without them.  Her appetite has been somewhat poor recently,  although she states she is hungry today.   PHYSICAL EXAM:  GENERAL:  A pleasant, chipper rather petite, elderly  Caucasian female, in no acute distress, anicteric.  No frank pallor.  CHEST:  Clear.  HEART:  Normal.  ABDOMEN:  Minimally distended and moderately tympanitic, soft and a  little bit touchy, but not frankly tender.  RECTAL:  Exam was not repeated; last night, by Dr. Brigitte Pulse, it showed  [facility]  blood.   LABS:  See above.  White count on admission was 14,000, this morning it  is 13,000, 92 polys, 3 lymphs, platelets normal at 202,000.  INR normal  at 1.0.  Liver chemistries normal.   IMPRESSION:  The overall picture, primarily by history, is most  compatible with rectal outlet bleeding of hemorrhoidal origin, in  association with chronic constipation, related to medications and  relative immobility.   PLAN:  1. Sigmoidoscopic evaluation today.  Petra Kuba, purpose and risks      reviewed.  2. Consideration for long-term maintenance therapy with MiraLax, to      help prevent constipation.           ______________________________  Holly Weaver, M.D.     RB/MEDQ  D:  12/10/2006  T:  12/10/2006  Job:  [id number]   cc:   Holly Weaver, M.D.  Fax: [phone]

## 2010-06-06 NOTE — Discharge Summary (Signed)
NAMEPRANAVI, Holly Weaver           ACCOUNT NO.:  [id number]   MEDICAL RECORD NO.:  [id number]          PATIENT TYPE:  INP   LOCATION:  [id number]                         [facility]ACILITY:  [facility]   PHYSICIAN:  Janalyn Rouse, M.D.  DATE OF BIRTH:  03-Aug-1923   DATE OF ADMISSION:  12/09/2006  DATE OF DISCHARGE:  12/16/2006                               DISCHARGE SUMMARY   DISCHARGE DIAGNOSES:  1. Lower gastrointestinal bleed secondary to ischemic colitis.  2. Abdominal pain secondary to #1.  3. Anemia secondary to #1.  4. Hypertension.  5. Chronic constipation.  6. Breast cancer on marrow therapy (diagnosed February 2007) with no      surgery or radiation.  7. Osteoporosis with a history of multiple vertebral compression      fractures, status post T11 vertebroplasty (May 2006).  8. History of renal artery stenosis.  9. Hyperlipidemia.  10.Moderate pulmonary hypertension.  11.Nonobstructive coronary artery disease (cardiac catheterization      2000).   DISCHARGE MEDICATIONS:  1. Norvasc 5 mg daily.  2. Altace 5 mg twice daily, which is a lower dose.  3. Femara 2.5 mg daily.  4. Lopressor 50 mg b.i.d.  5. Darvocet N 100 q.6 h p.r.n. pain.  6. Lipitor 20 mg daily.  7. Prevacid 30 mg daily.  8. Forteo 20 mcg daily.  9. Ergocalciferol 50,000 units weekly.  10.Levsin 0.125 mg q.6 h p.r.n. cramps.  11.GlycoLax 17 g daily as needed for constipation.   HOSPITAL PROCEDURES:  1. Flexible sigmoidoscopy (December 10, 2006 by Dr. Cristina Gong):      Findings consistent with segmental colitis of the descending colon      consistent with moderately severe ischemic colitis.  2. CT angiogram of the abdomen and pelvis (November 20):  No      significant mesenteric artery occlusive disease by CT angiography.      Arteries are well depicted and demonstrate only mild roughly 30%      stenosis at the origin of both the celiac axis and superior      mesenteric artery.  Inferior mesenteric artery shows no  significant      disease and is open.  Moderate range bilateral renal artery      stenosis, which are not critical by CT angiography.  Evidence of      colitis of the descending colon with diffuse thickening and      surrounding inflammatory change, but no associated visible focal      abscess, perforation or obstruction.  Moderate free fluid in the      pelvis with inflammatory changes of the descending colon and to the      sigmoid colon.Marland Kitchen   HOSPITAL CONSULT:  Gastroenterology, Dr. Ronald Lobo.   HISTORY OF PRESENT ILLNESS:  For full details, please see dictated  history and physical.  Briefly, Holly Weaver is a very pleasant 77-year-  old white female with a history of breast cancer on Femara therapy,  osteoporosis resulting in chronic back pain due to multiple compression  fractures, who presented to the emergency department on November 17 with  bright red blood per rectum for  less than 12 hours.  She has been  experiencing increasing back pain and has been using more Darvocet.  She  had crampy lower abdominal pain and constipation on the morning of  presentation followed by a large bowel movement with bright red blood  per rectum.  She also noted some bloody clots.  She presented to the  emergency department where her vital signs were stable with a blood  pressure of 114/68 and a heart rate of 78.  Hemoglobin was 14.3.  Rectal  exam showed jelly consistency bloody stool proximal to the rectum and  she was admitted for further management.   HOSPITAL COURSE:  The patient was admitted to a medical bed.  Serial  hemoglobins were stable.  She was gently hydrated.  Gastroenterology  consult was obtained and the patient was seen by Dr. Cristina Gong on November  18.  He performed an unprepped flexible sigmoidoscopy on the afternoon  of November 18 that showed findings consistent with segmental ischemic  colitis.  The patient was treated supportively for this and had waxing  and waning  abdominal pain over the next several days.  Her hemoglobin  remained stable.  A CT of the abdomen and pelvis was obtained to rule  out a focal stenosis with results as above.  With ongoing supportive  therapy, gentle hydration, and adjustment of her blood pressure  medications, the patient's abdominal pain gradually improved to the  point that she was tolerating p.o.'s.  She was felt stable for discharge  home on November 24.  She did not have any significant fever, peritoneal  signs on exam, or leukocytosis to suggest any complications from her  bowel ischemia.   DISCHARGE LABS:  On the day of discharge CBC shows a white count of 6.4,  hemoglobin 10.7, platelets 256.  BMET significant for sodium 139,  potassium 4.1, chloride 102, bicarb 32, BUN 7, creatinine 0.8, glucose  101.   DISCHARGE INSTRUCTIONS:  The patient was instructed to call if she has  increasing abdominal pain, bleeding, or fever.   DISCHARGE DIET:  Low-residue diet.  Advance as tolerated to a heart  healthy diet.   HOSPITAL FOLLOW-UP:  She will follow up with Dr. Cristina Gong on December 5  at 3:45.  She will follow up with Dr. Brigitte Pulse in 7-10 days for repeat CBC.  She was instructed to stop taking her Celebrex, aspirin, and  hydrochlorothiazide.   DISPOSITION:  To home.      Janalyn Rouse, M.D.  Electronically Signed     WS/MEDQ  D:  12/27/2006  T:  12/27/2006  Job:  [id number]

## 2010-06-06 NOTE — Op Note (Signed)
Holly Weaver, Holly Weaver           ACCOUNT NO.:  [id number]   MEDICAL RECORD NO.:  [id number]          PATIENT TYPE:  INP   LOCATION:  88                         [facility]ACILITY:  [facility]   PHYSICIAN:  Ronald Lobo, M.D.   DATE OF BIRTH:  05/03/1923   DATE OF PROCEDURE:  12/10/2006  DATE OF DISCHARGE:                               OPERATIVE REPORT   PROCEDURE:  Colonoscopy (limited) with biopsies.   INDICATIONS:  An 77 year old female who presented to the hospital with  rectal bleeding yesterday and has remained stable since.  There was an  element of diarrhea present but that was after taking some milk of  magnesia.   FINDINGS:  Ischemic colitis of the descending colon.   PROCEDURE:  The nature, purpose, risks of flexible sigmoidoscopy were  discussed with the patient who provided written consent and was brought  from her hospital room to the endoscopy unit following a Fleet's enema  prep.  Sedation for this procedure was fentanyl 25 mcg and Versed 3 mg  IV without arrhythmias or clinical instability.  The Pentax pediatric  video colonoscope was inserted and advanced to the distal transverse  colon and pullback was then performed.   On the way in, serosanguineous fluid was noted in the colonic lumen.  The mucosa of the rectum in the distal rectosigmoid looked normal but  beginning at 25 cm, there was circumferential colitis characterized by  complete loss of vascularity, mucosal edema, dusky appearing mucosa, and  superficial mucosal necrosis with exudate.  These abnormalities extended  in a confluent fashion up to the region of the splenic flexure, with the  distal transverse colon looking normal.  Biopsies from the inflamed  segment were obtained.   I did not see any polyps, masses, pseudomembranes or diverticular  disease on this exam.  Retroflexion in the rectum was normal.   The patient tolerated the procedure well and there no apparent  complications.  I elected to advance  the scope beyond the sigmoid area  because I wanted to get above the area of abnormality if possible, on  the other hand, I elected not to continue advancing the scope beyond  that point to avoid significant mechanical stress on an inflamed and  possibly weakened colonic wall to reduce the risk of complications,  especially since the diagnostic objective of the procedure had been  achieved.   IMPRESSION:  Segmental colitis of the region of the descending colon  consistent with moderately severe ischemic colitis as described above.   PLAN:  1. Await pathology results.  2. Continue supportive care.  I do not feel that antibiotic therapy is      necessary unless the patient becomes febrile      or clinically toxic.  3. Continue to monitor the white count and the patient's symptoms and      exam, with consideration for surgical consultation if she worsens      clinically.           ______________________________  Ronald Lobo, M.D.     RB/MEDQ  D:  12/10/2006  T:  12/11/2006  Job:  [id number]  cc:   Jeneen Rinks L. Rolla Flatten., M.D.  [phone]   Janalyn Rouse, M.D.  Fax: [phone]

## 2010-10-31 LAB — BASIC METABOLIC PANEL
BUN: 18
BUN: 5 — ABNORMAL LOW
BUN: 6
BUN: 8
CO2: 26
CO2: 28
CO2: 32
Calcium: 8.1 — ABNORMAL LOW
Calcium: 8.3 — ABNORMAL LOW
Calcium: 8.9
Calcium: 9.1
Chloride: 101
Chloride: 107
Creatinine, Ser: 0.63
Creatinine, Ser: 0.7
Creatinine, Ser: 0.71
Creatinine, Ser: 0.72
GFR calc Af Amer: >60
GFR calc Af Amer: >60
GFR calc Af Amer: >60
GFR calc non Af Amer: >60
GFR calc non Af Amer: >60
GFR calc non Af Amer: >60
Glucose, Bld: 100 — ABNORMAL HIGH
Glucose, Bld: 100 — ABNORMAL HIGH
Glucose, Bld: 101 — ABNORMAL HIGH
Glucose, Bld: 147 — ABNORMAL HIGH
Potassium: 3.2 — ABNORMAL LOW
Potassium: 3.6
Potassium: 3.7
Potassium: 3.9
Sodium: 134 — ABNORMAL LOW
Sodium: 137
Sodium: 139
Sodium: 139
Sodium: 141

## 2010-10-31 LAB — COMPREHENSIVE METABOLIC PANEL
Albumin: 4.3
Alkaline Phosphatase: 60
BUN: 25 — ABNORMAL HIGH
Chloride: 96
Creatinine, Ser: 0.99
Glucose, Bld: 161 — ABNORMAL HIGH
Total Bilirubin: 1.1
Total Protein: 6.5

## 2010-10-31 LAB — CBC
HCT: 28.7 — ABNORMAL LOW
HCT: 29.8 — ABNORMAL LOW
HCT: 30.4 — ABNORMAL LOW
HCT: 34.5 — ABNORMAL LOW
HCT: 40.9
Hemoglobin: 10.2 — ABNORMAL LOW
Hemoglobin: 10.3 — ABNORMAL LOW
Hemoglobin: 10.7 — ABNORMAL LOW
Hemoglobin: 12.1
Hemoglobin: 14.3
MCHC: 35
MCHC: 35.1
MCHC: 35.4
MCV: 98.1
MCV: 98.4
MCV: 99.2
Platelets: 149 — ABNORMAL LOW
Platelets: 151
Platelets: 201
Platelets: 232
Platelets: 256
RBC: 2.92 — ABNORMAL LOW
RBC: 2.99 — ABNORMAL LOW
RBC: 3.01 — ABNORMAL LOW
RDW: 13.5
RDW: 13.5
RDW: 13.8
RDW: 14
RDW: 14.4
WBC: 12.5 — ABNORMAL HIGH
WBC: 7.7
WBC: 7.9

## 2010-10-31 LAB — DIFFERENTIAL
Basophils Absolute: 0
Basophils Relative: 0
Lymphocytes Relative: 3 — ABNORMAL LOW
Monocytes Absolute: 0.7
Neutro Abs: 12.9 — ABNORMAL HIGH
Neutrophils Relative %: 92 — ABNORMAL HIGH

## 2010-10-31 LAB — OCCULT BLOOD X 1 CARD TO LAB, STOOL: Fecal Occult Bld: POSITIVE

## 2010-10-31 LAB — URINALYSIS, ROUTINE W REFLEX MICROSCOPIC
Glucose, UA: NEGATIVE
Nitrite: NEGATIVE
Urobilinogen, UA: 1

## 2010-10-31 LAB — ABO/RH: ABO/RH(D): A POS

## 2010-10-31 LAB — HEMOGLOBIN AND HEMATOCRIT, BLOOD: HCT: 31.6 — ABNORMAL LOW

## 2010-10-31 LAB — PROTIME-INR: Prothrombin Time: 13.5

## 2010-10-31 LAB — TYPE AND SCREEN: Antibody Screen: NEGATIVE

## 2011-02-06 ENCOUNTER — Other Ambulatory Visit: Payer: Self-pay | Admitting: Oncology

## 2011-02-06 DIAGNOSIS — C50919 Malignant neoplasm of unspecified site of unspecified female breast: Secondary | ICD-10-CM

## 2011-03-05 ENCOUNTER — Ambulatory Visit
Admission: RE | Admit: 2011-03-05 | Discharge: 2011-03-05 | Disposition: A | Discharge status: Home / Self Care | Payer: Medicare Other | Source: Ambulatory Visit | Attending: Oncology | Admitting: Oncology

## 2011-03-05 DIAGNOSIS — Z853 Personal history of malignant neoplasm of breast: Secondary | ICD-10-CM

## 2011-03-05 DIAGNOSIS — C50419 Malignant neoplasm of upper-outer quadrant of unspecified female breast: Secondary | ICD-10-CM | POA: Diagnosis not present

## 2011-03-15 ENCOUNTER — Telehealth: Payer: Self-pay | Admitting: *Deleted

## 2011-03-15 ENCOUNTER — Ambulatory Visit (HOSPITAL_BASED_OUTPATIENT_CLINIC_OR_DEPARTMENT_OTHER): Payer: Medicare Other | Admitting: Lab

## 2011-03-15 ENCOUNTER — Ambulatory Visit (HOSPITAL_BASED_OUTPATIENT_CLINIC_OR_DEPARTMENT_OTHER): Payer: Medicare Other | Admitting: Oncology

## 2011-03-15 VITALS — BP 147/65 | HR 75 | Temp 97.6°F | Ht 61.0 in | Wt 119.7 lb

## 2011-03-15 DIAGNOSIS — C50919 Malignant neoplasm of unspecified site of unspecified female breast: Secondary | ICD-10-CM

## 2011-03-15 DIAGNOSIS — C50419 Malignant neoplasm of upper-outer quadrant of unspecified female breast: Secondary | ICD-10-CM

## 2011-03-15 DIAGNOSIS — E559 Vitamin D deficiency, unspecified: Secondary | ICD-10-CM

## 2011-03-15 DIAGNOSIS — M810 Age-related osteoporosis without current pathological fracture: Secondary | ICD-10-CM | POA: Diagnosis not present

## 2011-03-15 LAB — CBC WITH DIFFERENTIAL/PLATELET
Basophils Absolute: 0.1 10*3/uL (ref 0.0–0.1)
EOS%: 1.7 % (ref 0.0–7.0)
HCT: 38.6 % (ref 34.8–46.6)
HGB: 13.1 g/dL (ref 11.6–15.9)
MCH: 34 pg (ref 25.1–34.0)
MCV: 100.4 fL (ref 79.5–101.0)
NEUT%: 75.2 % (ref 38.4–76.8)
lymph#: 1 10*3/uL (ref 0.9–3.3)

## 2011-03-15 NOTE — Progress Notes (Signed)
Hematology and Oncology Follow Up Visit  Holly Weaver [id number] 1923/03/18 78 y.o. 03/15/2011 12:03 PM PCP [name] Principle Diagnosis: Locally advanced breast cancer on Femara since March 07, no surgery  Interim History:  There have been no intercurrent illness, hospitalizations or medication changes. She continues to do well on Femara. She has not had any surgical followup. Despite this she is content with going ahead with the Femara without any surgical intervention. She is on a large number of other medications vitamins and changes in her antihypertensive medications. Overall performance status is good and she feels well.  Medications: I have reviewed the patient's current medications.  Allergies: No Known Allergies  Past Medical History, Surgical history, Social history, and Family History were reviewed and updated.  Review of Systems: Constitutional:  Negative for fever, chills, night sweats, anorexia, weight loss, pain. Cardiovascular: no chest pain or dyspnea on exertion Respiratory: no cough, shortness of breath, or wheezing Neurological: negative Dermatological: negative ENT: negative Skin Gastrointestinal: negative Genito-Urinary: negative Hematological and Lymphatic: negative Breast: negative Musculoskeletal: negative Remaining ROS negative.  Physical Exam: Blood pressure 147/65, pulse 75, temperature 97.6 F (36.4 C), height 5\' 1"  (1.549 m), weight 119 lb 11.2 oz (54.296 kg). ECOG:  General appearance: alert, cooperative and appears stated age Head: Normocephalic, without obvious abnormality, atraumatic Neck: no adenopathy, no carotid bruit, no JVD, supple, symmetrical, trachea midline and thyroid not enlarged, symmetric, no tenderness/mass/nodules Lymph nodes: Cervical, supraclavicular, and axillary nodes normal. Cardiac : regular rate and rhythm, no murmurs or gallops Pulmonary:clear to auscultation bilaterally and normal percussion  bilaterally Breasts: inspection negative, no nipple discharge or bleeding, no masses or nodularity palpable Abdomen:soft, non-tender; bowel sounds normal; no masses,  no organomegaly Extremities negative Neuro: alert, oriented, normal speech, no focal findings or movement disorder noted  Lab Results: Lab Results  Component Value Date   WBC 6.4 03/15/2011   HGB 13.1 03/15/2011   HCT 38.6 03/15/2011   MCV 100.4 03/15/2011   PLT 179 03/15/2011     Chemistry      Component Value Date/Time   NA 137 04/14/2010 1103   NA 137 04/14/2010 1103   K 3.7 04/14/2010 1103   K 3.7 04/14/2010 1103   CL 96 04/14/2010 1103   CL 96 04/14/2010 1103   CO2 26 04/14/2010 1103   CO2 26 04/14/2010 1103   BUN 15 04/14/2010 1103   BUN 15 04/14/2010 1103   CREATININE 1.10 04/14/2010 1103   CREATININE 1.10 04/14/2010 1103      Component Value Date/Time   CALCIUM 10.1 04/14/2010 1103   CALCIUM 10.1 04/14/2010 1103   ALKPHOS 63 04/14/2010 1103   ALKPHOS 63 04/14/2010 1103   AST 24 04/14/2010 1103   AST 24 04/14/2010 1103   ALT 14 04/14/2010 1103   ALT 14 04/14/2010 1103   BILITOT 1.0 04/14/2010 1103   BILITOT 1.0 04/14/2010 1103      .pathology. Radiological Studies: chest X-ray n/a Mammogram 03/05/11- stable spiculated density seen in the breast which is stable and has not changed. Bone density n/a  Impression and Plan: 78 year old woman history of breast cancer stable on Femara, no evidence of progression.. I will see her in 6 months time for followup. She seems to be doing well with this approach to treating her breast cancer.  More than 50% of the visit was spent in patient-related counselling   Eston Esters, MD 2/21/201312:03 PM

## 2011-03-15 NOTE — Telephone Encounter (Signed)
gave patient appointment for 08-2011 with labs the same day printed out calendar and gave to the patient

## 2011-03-16 ENCOUNTER — Other Ambulatory Visit: Payer: Medicare Other | Admitting: Lab

## 2011-03-16 LAB — COMPREHENSIVE METABOLIC PANEL
AST: 24 U/L (ref 0–37)
BUN: 22 mg/dL (ref 6–23)
Calcium: 10.7 mg/dL — ABNORMAL HIGH (ref 8.4–10.5)
Chloride: 97 mEq/L (ref 96–112)
Creatinine, Ser: 1.18 mg/dL — ABNORMAL HIGH (ref 0.50–1.10)

## 2011-03-16 LAB — VITAMIN D 25 HYDROXY (VIT D DEFICIENCY, FRACTURES): Vit D, 25-Hydroxy: 48 ng/mL (ref 30–89)

## 2011-03-26 ENCOUNTER — Telehealth: Payer: Self-pay | Admitting: Oncology

## 2011-03-26 NOTE — Telephone Encounter (Signed)
Pending appts for march deleted due to pt seen in feb and orders given for aug f/u.

## 2011-05-03 ENCOUNTER — Other Ambulatory Visit: Payer: Self-pay | Admitting: Oncology

## 2011-05-03 DIAGNOSIS — C50419 Malignant neoplasm of upper-outer quadrant of unspecified female breast: Secondary | ICD-10-CM

## 2011-05-31 DIAGNOSIS — E782 Mixed hyperlipidemia: Secondary | ICD-10-CM | POA: Diagnosis not present

## 2011-05-31 DIAGNOSIS — I2510 Atherosclerotic heart disease of native coronary artery without angina pectoris: Secondary | ICD-10-CM | POA: Diagnosis not present

## 2011-05-31 DIAGNOSIS — I10 Essential (primary) hypertension: Secondary | ICD-10-CM | POA: Diagnosis not present

## 2011-06-25 DIAGNOSIS — E785 Hyperlipidemia, unspecified: Secondary | ICD-10-CM | POA: Diagnosis not present

## 2011-06-25 DIAGNOSIS — M810 Age-related osteoporosis without current pathological fracture: Secondary | ICD-10-CM | POA: Diagnosis not present

## 2011-06-25 DIAGNOSIS — I2789 Other specified pulmonary heart diseases: Secondary | ICD-10-CM | POA: Diagnosis not present

## 2011-06-25 DIAGNOSIS — R7301 Impaired fasting glucose: Secondary | ICD-10-CM | POA: Diagnosis not present

## 2011-09-13 ENCOUNTER — Other Ambulatory Visit (HOSPITAL_BASED_OUTPATIENT_CLINIC_OR_DEPARTMENT_OTHER): Payer: Medicare Other | Admitting: Lab

## 2011-09-13 ENCOUNTER — Ambulatory Visit (HOSPITAL_BASED_OUTPATIENT_CLINIC_OR_DEPARTMENT_OTHER): Payer: Medicare Other | Admitting: Oncology

## 2011-09-13 VITALS — BP 115/77 | HR 123 | Temp 98.3°F | Resp 20 | Ht 61.0 in | Wt 120.5 lb

## 2011-09-13 DIAGNOSIS — C50919 Malignant neoplasm of unspecified site of unspecified female breast: Secondary | ICD-10-CM | POA: Diagnosis not present

## 2011-09-13 DIAGNOSIS — E559 Vitamin D deficiency, unspecified: Secondary | ICD-10-CM

## 2011-09-13 LAB — CBC WITH DIFFERENTIAL/PLATELET
BASO%: 1.3 % (ref 0.0–2.0)
EOS%: 2.5 % (ref 0.0–7.0)
HCT: 41.4 % (ref 34.8–46.6)
LYMPH%: 20.3 % (ref 14.0–49.7)
MCH: 34.4 pg — ABNORMAL HIGH (ref 25.1–34.0)
MCHC: 34.3 g/dL (ref 31.5–36.0)
MONO%: 8.8 % (ref 0.0–14.0)
NEUT%: 67.1 % (ref 38.4–76.8)
Platelets: 206 10*3/uL (ref 145–400)
RBC: 4.13 10*6/uL (ref 3.70–5.45)

## 2011-09-13 NOTE — Progress Notes (Signed)
Hematology and Oncology Follow Up Visit  Holly Weaver [id number] 1923-07-18 78 y.o. 09/13/2011 2:17 PM PCP [name] Principle Diagnosis: Locally advanced breast cancer on Femara since March 07, no surgery  Interim History:  There have been no intercurrent illness, hospitalizations or medication changes. She continues to do well on Femara. She has not had any surgical followup. Despite this she is content with going ahead with the Femara without any surgical intervention. She is on a large number of other medications vitamins and changes in her antihypertensive medications. Overall performance status is good and she feels well.  Medications: I have reviewed the patient's current medications.  Allergies: No Known Allergies  Past Medical History, Surgical history, Social history, and Family History were reviewed and updated.  Review of Systems: Constitutional:  Negative for fever, chills, night sweats, anorexia, weight loss, pain. Cardiovascular: no chest pain or dyspnea on exertion Respiratory: no cough, shortness of breath, or wheezing Neurological: negative Dermatological: negative ENT: negative Skin Gastrointestinal: negative Genito-Urinary: negative Hematological and Lymphatic: negative Breast: negative Musculoskeletal: negative Remaining ROS negative.  Physical Exam: Blood pressure 115/77, pulse 123, temperature 98.3 F (36.8 C), temperature source Oral, resp. rate 20, height 5\' 1"  (1.549 m), weight 120 lb 8 oz (54.658 kg). ECOG:  General appearance: alert, cooperative and appears stated age Head: Normocephalic, without obvious abnormality, atraumatic Neck: no adenopathy, no carotid bruit, no JVD, supple, symmetrical, trachea midline and thyroid not enlarged, symmetric, no tenderness/mass/nodules Lymph nodes: Cervical, supraclavicular, and axillary nodes normal. Cardiac : regular rate and rhythm, no murmurs or gallops Pulmonary:clear to auscultation bilaterally  and normal percussion bilaterally Breasts: inspection negative, no nipple discharge or bleeding, no masses or nodularity palpable Abdomen:soft, non-tender; bowel sounds normal; no masses,  no organomegaly Extremities negative Neuro: alert, oriented, normal speech, no focal findings or movement disorder noted  Lab Results: Lab Results  Component Value Date   WBC 5.1 09/13/2011   HGB 14.2 09/13/2011   HCT 41.4 09/13/2011   MCV 100.2 09/13/2011   PLT 206 09/13/2011     Chemistry      Component Value Date/Time   NA 136 03/15/2011 1101   K 3.7 03/15/2011 1101   CL 97 03/15/2011 1101   CO2 29 03/15/2011 1101   BUN 22 03/15/2011 1101   CREATININE 1.18* 03/15/2011 1101      Component Value Date/Time   CALCIUM 10.7* 03/15/2011 1101   ALKPHOS 48 03/15/2011 1101   AST 24 03/15/2011 1101   ALT 15 03/15/2011 1101   BILITOT 0.8 03/15/2011 1101      .pathology. Radiological Studies: chest X-ray n/a Mammogram 03/05/11- stable spiculated density seen in the breast which is stable and has not changed. Bone density n/a  Impression and Plan: 78 year old woman history of breast cancer stable on Femara, no evidence of progression.. I will see her in 6 months time for followup. She seems to be doing well with this approach to treating her breast cancer.  More than 50% of the visit was spent in patient-related counselling   Eston Esters, MD 8/22/20132:17 PM

## 2011-09-13 NOTE — Patient Instructions (Signed)
YOU ARE DOING WELL, WE WILL SEE YOU IN 6 MONTHS WITH A FOLLOW UP MAMMOGRAM AND ULTRASOUND OF THE LT BREAST.

## 2011-09-14 ENCOUNTER — Telehealth: Payer: Self-pay | Admitting: *Deleted

## 2011-09-14 LAB — COMPREHENSIVE METABOLIC PANEL
ALT: 31 U/L (ref 0–35)
AST: 35 U/L (ref 0–37)
Alkaline Phosphatase: 48 U/L (ref 39–117)
CO2: 31 mEq/L (ref 19–32)
Creatinine, Ser: 1.25 mg/dL — ABNORMAL HIGH (ref 0.50–1.10)
Total Bilirubin: 1.1 mg/dL (ref 0.3–1.2)

## 2011-09-14 LAB — VITAMIN D 25 HYDROXY (VIT D DEFICIENCY, FRACTURES): Vit D, 25-Hydroxy: 51 ng/mL (ref 30–89)

## 2011-09-14 NOTE — Telephone Encounter (Signed)
03-18-2012 gave patient appointment for 03-18-2012 starting at 2:00pm

## 2011-10-23 DIAGNOSIS — H2510 Age-related nuclear cataract, unspecified eye: Secondary | ICD-10-CM | POA: Diagnosis not present

## 2011-10-23 DIAGNOSIS — H0230 Blepharochalasis unspecified eye, unspecified eyelid: Secondary | ICD-10-CM | POA: Diagnosis not present

## 2011-10-23 DIAGNOSIS — Z961 Presence of intraocular lens: Secondary | ICD-10-CM | POA: Diagnosis not present

## 2011-10-23 DIAGNOSIS — H04129 Dry eye syndrome of unspecified lacrimal gland: Secondary | ICD-10-CM | POA: Diagnosis not present

## 2011-11-01 ENCOUNTER — Other Ambulatory Visit: Payer: Self-pay | Admitting: Oncology

## 2011-12-12 DIAGNOSIS — I495 Sick sinus syndrome: Secondary | ICD-10-CM | POA: Diagnosis not present

## 2011-12-12 DIAGNOSIS — I4891 Unspecified atrial fibrillation: Secondary | ICD-10-CM | POA: Diagnosis not present

## 2011-12-12 DIAGNOSIS — I2510 Atherosclerotic heart disease of native coronary artery without angina pectoris: Secondary | ICD-10-CM | POA: Diagnosis not present

## 2011-12-12 DIAGNOSIS — R5381 Other malaise: Secondary | ICD-10-CM | POA: Diagnosis not present

## 2011-12-12 DIAGNOSIS — I10 Essential (primary) hypertension: Secondary | ICD-10-CM | POA: Diagnosis not present

## 2011-12-12 DIAGNOSIS — R6889 Other general symptoms and signs: Secondary | ICD-10-CM | POA: Diagnosis not present

## 2011-12-14 ENCOUNTER — Other Ambulatory Visit (HOSPITAL_COMMUNITY): Payer: Self-pay | Admitting: Cardiovascular Disease

## 2011-12-14 DIAGNOSIS — I4891 Unspecified atrial fibrillation: Secondary | ICD-10-CM

## 2011-12-19 ENCOUNTER — Ambulatory Visit (HOSPITAL_COMMUNITY): Payer: Medicare Other

## 2011-12-26 DIAGNOSIS — R7301 Impaired fasting glucose: Secondary | ICD-10-CM | POA: Diagnosis not present

## 2011-12-26 DIAGNOSIS — E785 Hyperlipidemia, unspecified: Secondary | ICD-10-CM | POA: Diagnosis not present

## 2011-12-26 DIAGNOSIS — I10 Essential (primary) hypertension: Secondary | ICD-10-CM | POA: Diagnosis not present

## 2011-12-26 DIAGNOSIS — Z23 Encounter for immunization: Secondary | ICD-10-CM | POA: Diagnosis not present

## 2011-12-26 DIAGNOSIS — M159 Polyosteoarthritis, unspecified: Secondary | ICD-10-CM | POA: Diagnosis not present

## 2011-12-26 DIAGNOSIS — I2510 Atherosclerotic heart disease of native coronary artery without angina pectoris: Secondary | ICD-10-CM | POA: Diagnosis not present

## 2011-12-26 DIAGNOSIS — I4891 Unspecified atrial fibrillation: Secondary | ICD-10-CM | POA: Diagnosis not present

## 2012-01-01 ENCOUNTER — Ambulatory Visit (HOSPITAL_COMMUNITY)
Admission: RE | Admit: 2012-01-01 | Discharge: 2012-01-01 | Disposition: A | Discharge status: Home / Self Care | Payer: Medicare Other | Source: Ambulatory Visit | Attending: Cardiovascular Disease | Admitting: Cardiovascular Disease

## 2012-01-01 DIAGNOSIS — I359 Nonrheumatic aortic valve disorder, unspecified: Secondary | ICD-10-CM | POA: Diagnosis not present

## 2012-01-01 DIAGNOSIS — I10 Essential (primary) hypertension: Secondary | ICD-10-CM | POA: Insufficient documentation

## 2012-01-01 DIAGNOSIS — Z7901 Long term (current) use of anticoagulants: Secondary | ICD-10-CM | POA: Diagnosis not present

## 2012-01-01 DIAGNOSIS — I059 Rheumatic mitral valve disease, unspecified: Secondary | ICD-10-CM | POA: Diagnosis not present

## 2012-01-01 DIAGNOSIS — I4891 Unspecified atrial fibrillation: Secondary | ICD-10-CM | POA: Diagnosis not present

## 2012-01-01 DIAGNOSIS — I2510 Atherosclerotic heart disease of native coronary artery without angina pectoris: Secondary | ICD-10-CM | POA: Insufficient documentation

## 2012-01-01 DIAGNOSIS — I517 Cardiomegaly: Secondary | ICD-10-CM | POA: Insufficient documentation

## 2012-01-01 NOTE — Progress Notes (Signed)
[facility]   2D echo completed 01/01/2012.   Jamison Neighbor, RDCS

## 2012-01-08 DIAGNOSIS — I4891 Unspecified atrial fibrillation: Secondary | ICD-10-CM | POA: Diagnosis not present

## 2012-01-08 DIAGNOSIS — I2510 Atherosclerotic heart disease of native coronary artery without angina pectoris: Secondary | ICD-10-CM | POA: Diagnosis not present

## 2012-01-08 DIAGNOSIS — I495 Sick sinus syndrome: Secondary | ICD-10-CM | POA: Diagnosis not present

## 2012-01-08 DIAGNOSIS — Z7901 Long term (current) use of anticoagulants: Secondary | ICD-10-CM | POA: Diagnosis not present

## 2012-01-08 DIAGNOSIS — I10 Essential (primary) hypertension: Secondary | ICD-10-CM | POA: Diagnosis not present

## 2012-01-22 DIAGNOSIS — I4891 Unspecified atrial fibrillation: Secondary | ICD-10-CM | POA: Diagnosis not present

## 2012-01-22 DIAGNOSIS — Z7901 Long term (current) use of anticoagulants: Secondary | ICD-10-CM | POA: Diagnosis not present

## 2012-02-12 DIAGNOSIS — R6889 Other general symptoms and signs: Secondary | ICD-10-CM | POA: Diagnosis not present

## 2012-02-12 DIAGNOSIS — I495 Sick sinus syndrome: Secondary | ICD-10-CM | POA: Diagnosis not present

## 2012-02-12 DIAGNOSIS — I4891 Unspecified atrial fibrillation: Secondary | ICD-10-CM | POA: Diagnosis not present

## 2012-02-12 DIAGNOSIS — R5383 Other fatigue: Secondary | ICD-10-CM | POA: Diagnosis not present

## 2012-02-12 DIAGNOSIS — I10 Essential (primary) hypertension: Secondary | ICD-10-CM | POA: Diagnosis not present

## 2012-02-12 DIAGNOSIS — R5381 Other malaise: Secondary | ICD-10-CM | POA: Diagnosis not present

## 2012-02-22 ENCOUNTER — Encounter: Payer: Self-pay | Admitting: Oncology

## 2012-02-22 ENCOUNTER — Telehealth: Payer: Self-pay | Admitting: *Deleted

## 2012-02-22 NOTE — Telephone Encounter (Signed)
Called and spoke with patient about rescheduling her appt. Confirmed appt. With Ivan Croft, NP on 03/18/12 at 215.  Then will become Dr.Magrinat.

## 2012-02-27 DIAGNOSIS — Z7901 Long term (current) use of anticoagulants: Secondary | ICD-10-CM | POA: Diagnosis not present

## 2012-02-27 DIAGNOSIS — I4891 Unspecified atrial fibrillation: Secondary | ICD-10-CM | POA: Diagnosis not present

## 2012-02-27 DIAGNOSIS — Z79899 Other long term (current) drug therapy: Secondary | ICD-10-CM | POA: Diagnosis not present

## 2012-03-12 DIAGNOSIS — Z7901 Long term (current) use of anticoagulants: Secondary | ICD-10-CM | POA: Diagnosis not present

## 2012-03-12 DIAGNOSIS — I4891 Unspecified atrial fibrillation: Secondary | ICD-10-CM | POA: Diagnosis not present

## 2012-03-18 ENCOUNTER — Telehealth: Payer: Self-pay | Admitting: Oncology

## 2012-03-18 ENCOUNTER — Ambulatory Visit (HOSPITAL_BASED_OUTPATIENT_CLINIC_OR_DEPARTMENT_OTHER): Payer: Medicare Other | Admitting: Nurse Practitioner

## 2012-03-18 ENCOUNTER — Other Ambulatory Visit: Payer: Medicare Other | Admitting: Lab

## 2012-03-18 ENCOUNTER — Ambulatory Visit: Payer: Medicare Other | Admitting: Oncology

## 2012-03-18 VITALS — BP 126/67 | HR 65 | Temp 97.4°F | Resp 20 | Ht 61.0 in | Wt 116.1 lb

## 2012-03-18 DIAGNOSIS — C50912 Malignant neoplasm of unspecified site of left female breast: Secondary | ICD-10-CM

## 2012-03-18 DIAGNOSIS — C50919 Malignant neoplasm of unspecified site of unspecified female breast: Secondary | ICD-10-CM

## 2012-03-18 NOTE — Progress Notes (Signed)
[place], MD  DIAGNOSIS: Locally advanced LEFT breast cancer on Femara since March 07, no surgery. Original biopsy was 03/07/2005 showing invasive mammary carcinoma, ER + 95%, PR+ 13%, Her2 neu non-amplified   PAST THERAPY:  She was deemed to not be a good surgical candidate due to numerous comorbidities; she has been managed with Femara since her original diagnosis in March 2007.   CURRENT THERAPY: Femara 2.5 mg  INTERVAL HISTORY: Holly Weaver 79 y.o. female returns for routine follow up of her known left breast cancer which has been managed for 7 years now solely with hormonal therapy. The cancer is not palpable any more and has remained stable on annual mammograms.   MEDICAL HISTORY: osteoporosis, arthritis, hypertension, mitral valve prolapsehypercholesterolemia with mild coronary disease,  scoliosis, SSS, and most recently atrial fibrillation - she also has remote history of incident of pulmonary hypertension  SURGICAL HISTORY: hernia repair and remote retinal surgery  MEDICATIONS: Current Outpatient Prescriptions  Medication Sig Dispense Refill  . acetaminophen (TYLENOL) 325 MG tablet Take 650 mg by mouth every 4 (four) hours as needed.      Marland Kitchen alendronate (FOSAMAX) 70 MG tablet Take 70 mg by mouth every 7 (seven) days. Take with a full glass of water on an empty stomach.      . ALPRAZolam (XANAX) 0.5 MG tablet Take 0.5 mg by mouth every 8 (eight) hours as needed for sleep or anxiety.      Marland Kitchen amiodarone (PACERONE) 200 MG tablet Take 200 mg by mouth daily.      Marland Kitchen amLODipine (NORVASC) 5 MG tablet Take 5 mg by mouth daily.      Marland Kitchen atorvastatin (LIPITOR) 40 MG tablet Take 40 mg by mouth daily.      . calcium-vitamin D (OSCAL WITH D) 500-200 MG-UNIT per tablet Take 1 tablet by mouth daily.      Marland Kitchen letrozole (FEMARA) 2.5 MG tablet TAKE 1 TABLET BY MOUTH EVERY DAY  90 tablet  1  . metoprolol (LOPRESSOR) 50 MG tablet Take 75 mg by  mouth 2 (two) times daily.      Marland Kitchen olmesartan-hydrochlorothiazide (BENICAR HCT) 20-12.5 MG per tablet Take 1 tablet by mouth daily. Pt. Not sure of dose      . omeprazole (PRILOSEC) 20 MG capsule Take 20 mg by mouth daily.      . ondansetron (ZOFRAN) 4 MG tablet Take 4 mg by mouth every 4 (four) hours as needed.      . polyethylene glycol (MIRALAX / GLYCOLAX) packet Take 17 g by mouth daily.      . traMADol (ULTRAM) 50 MG tablet Take 50 mg by mouth every 8 (eight) hours as needed.      . Vitamin D, Ergocalciferol, (DRISDOL) 50000 UNITS CAPS Take 50,000 Units by mouth every 7 (seven) days.      Marland Kitchen warfarin (COUMADIN) 2.5 MG tablet Take 2.5 mg by mouth daily.      . celecoxib (CELEBREX) 200 MG capsule Take 200 mg by mouth daily.       No current facility-administered medications for this visit.    ALLERGIES:  has No Known Allergies.  REVIEW OF SYSTEMS: General: denies unexplained weight loss, fatigue, night sweats, fevers, chills HEENT: denies headaches, blurred vision, dizziness, loss of balance Cardiac: denies chest pain, pressure or palpitations Lungs: denies wheezing, shortness of breath or productive cough Abd: denies nausea, vomiting, constipation, diarrhea, indigestion, blood in stool or urine Extremities: arthritic pain  noted  The rest of the 14-point review of system was negative.   Filed Vitals:   03/18/12 1406  BP: 126/67  Pulse: 65  Temp: 97.4 F (36.3 C)  Resp: 20   Wt Readings from Last 3 Encounters:  03/18/12 116 lb 1.6 oz (52.663 kg)  09/13/11 120 lb 8 oz (54.658 kg)  03/15/11 119 lb 11.2 oz (54.296 kg)   ECOG Performance status: 1  PHYSICAL EXAMINATION: 79 yr old thin, frail- appearing caucasian female who appears her stated age   General:  Underweight female in no acute distress.   Eyes:  no scleral icterus.  EOMI ENT:  There were no oropharyngeal lesions.  Neck was without thyromegaly.  Lymphatics:  Negative cervical, supraclavicular, axillary, or inguinal  adenopathy.  Respiratory: lungs were clear bilaterally without wheezing or crackles.  Cardiovascular:  Regular rate and rhythm, S1/S2, without murmur, rub or gallop.  There was no pedal edema.   Breast: no nipple retraction, no nipple discharge, no unusual masses or thickening, negative axillae - no mass noted in either breast GI:  abdomen was soft, flat, nontender, nondistended, without organomegaly.  Musculoskeletal:  no spinal tenderness of palpation of vertebral spine.   Skin exam was without echymosis, petichae.   Neuro exam was nonfocal.  Cranial nerves II- XII grossly intact Patient was able to get on and off exam table with assistance.  Patient was alerted and oriented.  Attention was good.   Language was appropriate.  Mood was normal without depression.  Speech was not pressured.  Thought content was not tangential.     LABORATORY/RADIOLOGY DATA:  Lab Results  Component Value Date   WBC 5.1 09/13/2011   HGB 14.2 09/13/2011   HCT 41.4 09/13/2011   PLT 206 09/13/2011   GLUCOSE 101* 09/13/2011   ALT 31 09/13/2011   AST 35 09/13/2011   NA 136 09/13/2011   K 3.5 09/13/2011   CL 96 09/13/2011   CREATININE 1.25* 09/13/2011   BUN 19 09/13/2011   CO2 31 09/13/2011   INR 1.0 12/09/2006    ASSESSMENT AND PLAN: 79 yr old female with known stable left breast invasive ductal carcinoma, managed with Femara. She tolerates this well. She is requesting we extend her visits to annually as she sees "so many physicians all the time and this isn't changing." I certainly think this is a reasonable request. We will see her back in 1 year with labs at that time.  She will see either Dr. Jana Hakim or Launa Grill, NP-C at that time. She knows to contact us in the interim with any questions or concerns. She is due for her annual mammogram (which has been stable for past 7 years). We will schedule that for her today.     Burns Spain, AOCNP, NP-C

## 2012-03-18 NOTE — Telephone Encounter (Signed)
gv pt appt schedule for February 2015 and mammo/US for 3/7 @ [facility].

## 2012-03-21 DIAGNOSIS — I495 Sick sinus syndrome: Secondary | ICD-10-CM | POA: Diagnosis not present

## 2012-03-21 DIAGNOSIS — E782 Mixed hyperlipidemia: Secondary | ICD-10-CM | POA: Diagnosis not present

## 2012-03-21 DIAGNOSIS — I4891 Unspecified atrial fibrillation: Secondary | ICD-10-CM | POA: Diagnosis not present

## 2012-03-21 DIAGNOSIS — I10 Essential (primary) hypertension: Secondary | ICD-10-CM | POA: Diagnosis not present

## 2012-03-21 DIAGNOSIS — R5381 Other malaise: Secondary | ICD-10-CM | POA: Diagnosis not present

## 2012-03-28 ENCOUNTER — Other Ambulatory Visit: Payer: Medicare Other

## 2012-04-02 ENCOUNTER — Other Ambulatory Visit: Payer: Self-pay | Admitting: *Deleted

## 2012-04-02 MED ORDER — LETROZOLE 2.5 MG PO TABS: ORAL_TABLET | ORAL | Status: DC

## 2012-04-10 DIAGNOSIS — I4891 Unspecified atrial fibrillation: Secondary | ICD-10-CM | POA: Diagnosis not present

## 2012-04-10 DIAGNOSIS — R7301 Impaired fasting glucose: Secondary | ICD-10-CM | POA: Diagnosis not present

## 2012-04-10 DIAGNOSIS — M810 Age-related osteoporosis without current pathological fracture: Secondary | ICD-10-CM | POA: Diagnosis not present

## 2012-04-10 DIAGNOSIS — I10 Essential (primary) hypertension: Secondary | ICD-10-CM | POA: Diagnosis not present

## 2012-05-01 ENCOUNTER — Ambulatory Visit
Admission: RE | Admit: 2012-05-01 | Discharge: 2012-05-01 | Disposition: A | Discharge status: Home / Self Care | Payer: Medicare Other | Source: Ambulatory Visit | Attending: Oncology | Admitting: Oncology

## 2012-05-01 DIAGNOSIS — C50919 Malignant neoplasm of unspecified site of unspecified female breast: Secondary | ICD-10-CM

## 2012-05-01 DIAGNOSIS — E559 Vitamin D deficiency, unspecified: Secondary | ICD-10-CM

## 2012-05-10 ENCOUNTER — Encounter: Payer: Self-pay | Admitting: Pharmacist Clinician (PhC)/ Clinical Pharmacy Specialist

## 2012-05-10 DIAGNOSIS — I4891 Unspecified atrial fibrillation: Secondary | ICD-10-CM

## 2012-05-10 DIAGNOSIS — Z7901 Long term (current) use of anticoagulants: Secondary | ICD-10-CM | POA: Insufficient documentation

## 2012-05-10 DIAGNOSIS — I480 Paroxysmal atrial fibrillation: Secondary | ICD-10-CM | POA: Insufficient documentation

## 2012-05-13 DIAGNOSIS — Z7901 Long term (current) use of anticoagulants: Secondary | ICD-10-CM | POA: Diagnosis not present

## 2012-05-13 DIAGNOSIS — I4891 Unspecified atrial fibrillation: Secondary | ICD-10-CM | POA: Diagnosis not present

## 2012-06-12 DIAGNOSIS — Z7901 Long term (current) use of anticoagulants: Secondary | ICD-10-CM | POA: Diagnosis not present

## 2012-06-12 DIAGNOSIS — I4891 Unspecified atrial fibrillation: Secondary | ICD-10-CM | POA: Diagnosis not present

## 2012-07-06 ENCOUNTER — Encounter (HOSPITAL_COMMUNITY): Payer: Self-pay | Admitting: Emergency Medicine

## 2012-07-06 ENCOUNTER — Emergency Department (HOSPITAL_COMMUNITY): Payer: Medicare Other

## 2012-07-06 ENCOUNTER — Emergency Department (HOSPITAL_COMMUNITY)
Admission: EM | Admit: 2012-07-06 | Discharge: 2012-07-07 | Disposition: A | Discharge status: Home / Self Care | Payer: Medicare Other | Attending: Emergency Medicine | Admitting: Emergency Medicine

## 2012-07-06 DIAGNOSIS — S1093XA Contusion of unspecified part of neck, initial encounter: Secondary | ICD-10-CM | POA: Insufficient documentation

## 2012-07-06 DIAGNOSIS — W1809XA Striking against other object with subsequent fall, initial encounter: Secondary | ICD-10-CM | POA: Insufficient documentation

## 2012-07-06 DIAGNOSIS — Z79899 Other long term (current) drug therapy: Secondary | ICD-10-CM | POA: Diagnosis not present

## 2012-07-06 DIAGNOSIS — S0010XA Contusion of unspecified eyelid and periocular area, initial encounter: Secondary | ICD-10-CM | POA: Diagnosis not present

## 2012-07-06 DIAGNOSIS — Z853 Personal history of malignant neoplasm of breast: Secondary | ICD-10-CM | POA: Diagnosis not present

## 2012-07-06 DIAGNOSIS — R040 Epistaxis: Secondary | ICD-10-CM | POA: Diagnosis not present

## 2012-07-06 DIAGNOSIS — S0003XA Contusion of scalp, initial encounter: Secondary | ICD-10-CM | POA: Diagnosis not present

## 2012-07-06 DIAGNOSIS — S0990XA Unspecified injury of head, initial encounter: Secondary | ICD-10-CM | POA: Diagnosis not present

## 2012-07-06 DIAGNOSIS — Z8739 Personal history of other diseases of the musculoskeletal system and connective tissue: Secondary | ICD-10-CM | POA: Diagnosis not present

## 2012-07-06 DIAGNOSIS — Z7982 Long term (current) use of aspirin: Secondary | ICD-10-CM | POA: Insufficient documentation

## 2012-07-06 DIAGNOSIS — I10 Essential (primary) hypertension: Secondary | ICD-10-CM | POA: Insufficient documentation

## 2012-07-06 DIAGNOSIS — IMO0002 Reserved for concepts with insufficient information to code with codable children: Secondary | ICD-10-CM | POA: Diagnosis not present

## 2012-07-06 DIAGNOSIS — W19XXXA Unspecified fall, initial encounter: Secondary | ICD-10-CM

## 2012-07-06 DIAGNOSIS — Z7901 Long term (current) use of anticoagulants: Secondary | ICD-10-CM | POA: Insufficient documentation

## 2012-07-06 DIAGNOSIS — S0993XA Unspecified injury of face, initial encounter: Secondary | ICD-10-CM | POA: Diagnosis not present

## 2012-07-06 DIAGNOSIS — S298XXA Other specified injuries of thorax, initial encounter: Secondary | ICD-10-CM | POA: Diagnosis not present

## 2012-07-06 DIAGNOSIS — S0083XA Contusion of other part of head, initial encounter: Secondary | ICD-10-CM

## 2012-07-06 DIAGNOSIS — S0033XA Contusion of nose, initial encounter: Secondary | ICD-10-CM

## 2012-07-06 DIAGNOSIS — Y9301 Activity, walking, marching and hiking: Secondary | ICD-10-CM | POA: Insufficient documentation

## 2012-07-06 DIAGNOSIS — Y92009 Unspecified place in unspecified non-institutional (private) residence as the place of occurrence of the external cause: Secondary | ICD-10-CM | POA: Insufficient documentation

## 2012-07-06 DIAGNOSIS — M810 Age-related osteoporosis without current pathological fracture: Secondary | ICD-10-CM | POA: Insufficient documentation

## 2012-07-06 DIAGNOSIS — R079 Chest pain, unspecified: Secondary | ICD-10-CM | POA: Diagnosis not present

## 2012-07-06 DIAGNOSIS — I4891 Unspecified atrial fibrillation: Secondary | ICD-10-CM | POA: Insufficient documentation

## 2012-07-06 HISTORY — DX: Essential (primary) hypertension: I10

## 2012-07-06 HISTORY — DX: Malignant neoplasm of unspecified site of unspecified female breast: C50.919

## 2012-07-06 HISTORY — DX: Age-related osteoporosis without current pathological fracture: M81.0

## 2012-07-06 HISTORY — DX: Unspecified atrial fibrillation: I48.91

## 2012-07-06 HISTORY — DX: Scoliosis, unspecified: M41.9

## 2012-07-06 LAB — PROTIME-INR: INR: 2.9 — ABNORMAL HIGH (ref 0.00–1.49)

## 2012-07-06 NOTE — ED Notes (Signed)
Patient transported to CT 

## 2012-07-06 NOTE — ED Notes (Signed)
Pt states she was walking in her hallway and fell  Pt is c/o pain to her face and knees and states she has pain under her right shoulder blade  Pt has bruising and swelling noted to her nose and lower lip   Pt has swelling and bruising noted to her left hand  Pt states she is on coumadin  Pt denies LOC

## 2012-07-06 NOTE — ED Notes (Signed)
MD at bedside. 

## 2012-07-07 DIAGNOSIS — J3489 Other specified disorders of nose and nasal sinuses: Secondary | ICD-10-CM | POA: Diagnosis not present

## 2012-07-07 DIAGNOSIS — R040 Epistaxis: Secondary | ICD-10-CM | POA: Diagnosis not present

## 2012-07-07 DIAGNOSIS — S1093XA Contusion of unspecified part of neck, initial encounter: Secondary | ICD-10-CM | POA: Diagnosis not present

## 2012-07-07 NOTE — ED Provider Notes (Signed)
History     CSN: [id number]  Arrival date & time 07/06/12  2021   First MD Initiated Contact with Patient 07/06/12 2054      Chief Complaint  Patient presents with  . Fall    (Consider location/radiation/quality/duration/timing/severity/associated sxs/prior treatment) Patient is a 79 y.o. female presenting with fall. The history is provided by the patient.  Fall This is a new problem. Pertinent negatives include no chest pain, no abdominal pain, no headaches and no shortness of breath.   patient's mechanical fall. No loss consciousness. She hit her face. She has swelling to her face has had some minimal nose bleed. She has bilateral black eyes. She also states she has a little bit of pain in her right midback. No numbness or weakness. No confusion. No headache. No abdominal pain. No difficulty walking. No knee pain. Past Medical History  Diagnosis Date  . Breast cancer   . Hypertension   . Osteoporosis   . Scoliosis   . Atrial fibrillation     Past Surgical History  Procedure Laterality Date  . Hernia repair      Family History  Problem Relation Age of Onset  . Cancer Mother   . CAD Father   . Cancer Other     History  Substance Use Topics  . Smoking status: Never Smoker   . Smokeless tobacco: Not on file  . Alcohol Use: No    OB History   Grav Para Term Preterm Abortions TAB SAB Ect Mult Living                  Review of Systems  Constitutional: Negative for activity change and appetite change.  HENT: Positive for nosebleeds and facial swelling. Negative for neck pain, neck stiffness and voice change.   Eyes: Negative for pain.  Respiratory: Negative for chest tightness and shortness of breath.   Cardiovascular: Negative for chest pain and leg swelling.  Gastrointestinal: Negative for nausea, vomiting, abdominal pain and diarrhea.  Genitourinary: Negative for flank pain.  Musculoskeletal: Negative for back pain.  Skin: Positive for wound. Negative for  rash.  Neurological: Negative for weakness, numbness and headaches.  Hematological: Bruises/bleeds easily.  Psychiatric/Behavioral: Negative for behavioral problems.    Allergies  Codeine  Home Medications   Current Outpatient Rx  Name  Route  Sig  Dispense  Refill  . alendronate (FOSAMAX) 70 MG tablet   Oral   Take 70 mg by mouth every 7 (seven) days. Take with a full glass of water on an empty stomach.         . ALPRAZolam (XANAX) 0.5 MG tablet   Oral   Take 0.5 mg by mouth every 8 (eight) hours as needed for sleep or anxiety.         Marland Kitchen amiodarone (PACERONE) 200 MG tablet   Oral   Take 200 mg by mouth daily.         Marland Kitchen amLODipine (NORVASC) 5 MG tablet   Oral   Take 5 mg by mouth daily.         Marland Kitchen aspirin EC 81 MG tablet   Oral   Take 81 mg by mouth daily.         Marland Kitchen atorvastatin (LIPITOR) 40 MG tablet   Oral   Take 40 mg by mouth daily.         . calcium-vitamin D (OSCAL WITH D) 500-200 MG-UNIT per tablet   Oral   Take 1 tablet by mouth daily.         Marland Kitchen  letrozole (FEMARA) 2.5 MG tablet      TAKE 1 TABLET BY MOUTH EVERY DAY   90 tablet   3   . metoprolol (LOPRESSOR) 50 MG tablet   Oral   Take 50 mg by mouth 2 (two) times daily.          . Multiple Vitamin (MULTIVITAMIN WITH MINERALS) TABS   Oral   Take 1 tablet by mouth daily. Centrum 50+         . olmesartan-hydrochlorothiazide (BENICAR HCT) 20-12.5 MG per tablet   Oral   Take 1 tablet by mouth daily. Pt. Not sure of dose         . omeprazole (PRILOSEC) 20 MG capsule   Oral   Take 20 mg by mouth daily as needed (for acid reduction).          . ondansetron (ZOFRAN) 4 MG tablet   Oral   Take 4 mg by mouth every 4 (four) hours as needed.         . polyethylene glycol (MIRALAX / GLYCOLAX) packet   Oral   Take 17 g by mouth daily as needed (constipation).          . traMADol (ULTRAM) 50 MG tablet   Oral   Take 50 mg by mouth every 8 (eight) hours as needed.         .  Vitamin D, Ergocalciferol, (DRISDOL) 50000 UNITS CAPS   Oral   Take 50,000 Units by mouth every 7 (seven) days.         Marland Kitchen warfarin (COUMADIN) 2.5 MG tablet   Oral   Take 2.5-3.75 mg by mouth daily. Take 1.5 tabs on mon, and 1 tab on all other days           BP 141/51  Pulse 54  Temp(Src) 97.8 F (36.6 C) (Oral)  Resp 20  SpO2 99%  Physical Exam  Nursing note and vitals reviewed. Constitutional: She is oriented to person, place, and time. She appears well-developed and well-nourished.  HENT:  Head: Normocephalic.  Bilateral periorbital ecchymosis. Swelling of bridge of nose and body of nose. Septal hematoma on left. Trace of blood on left TM. No effusion  Eyes: EOM are normal. Pupils are equal, round, and reactive to light.  Neck: Normal range of motion. Neck supple.  Cardiovascular: Normal rate, regular rhythm and normal heart sounds.   No murmur heard. Pulmonary/Chest: Effort normal and breath sounds normal. No respiratory distress. She has no wheezes. She has no rales.  Abdominal: Soft. Bowel sounds are normal. She exhibits no distension. There is no tenderness. There is no rebound and no guarding.  Musculoskeletal: Normal range of motion.  Neurological: She is alert and oriented to person, place, and time. No cranial nerve deficit.  Skin: Skin is warm and dry.  Psychiatric: She has a normal mood and affect. Her speech is normal.    ED Course  Procedures (including critical care time)  Labs Reviewed  PROTIME-INR - Abnormal; Notable for the following:    Prothrombin Time 28.8 (*)    INR 2.90 (*)    All other components within normal limits   Dg Ribs Unilateral W/chest Right  07/06/2012   *RADIOLOGY REPORT*  Clinical Data: Fall tonight.  Pain under right scapula.  RIGHT RIBS AND CHEST - 3+ VIEW  Comparison: Chest radiograph 01/10/2010 and 04/20/2009  Findings: Cardiomegaly and slight prominence of the right hilum appears similar to the chest radiograph of 2011.  There  is a  moderate-sized hiatal hernia contour.  Thoracic aorta contour is stable.  The lungs are clear.  No airspace disease, effusion, or pneumothorax is identified.  The bones are diffusely osteopenic, and the patient has had to prior vertebroplasties near the thoracolumbar junction.  There is a compression fracture in a lower thoracic spine vertebral body (favored to be T10) that appears stable compared to prior chest radiograph.  There is a slight focal inward curvature of the lateral arc of the right seventh rib.  This appears similar to prior chest radiograph of 2011 and is favored to be a remote healed fracture.  No focal lucencies or cortical displacement is seen to definitely suggest an acute rib fracture.  IMPRESSION:  1.  No acute cardiopulmonary disease. Stable cardiomegaly. 2.  No definite acute right rib fracture is identified.  Probable remote right seventh rib fracture appears stable compared to prior chest radiograph.  The bones are osteopenic. 3.  At least three compression fractures are visualized as described above, two of which have had prior vertebroplasty therapy. 4.  Moderate hiatal hernia.   Original Report Authenticated By: Curlene Dolphin, M.D.   Ct Head Wo Contrast  07/06/2012   *RADIOLOGY REPORT*  Clinical Data:  History of trauma from a fall.  CT HEAD WITHOUT CONTRAST CT MAXILLOFACIAL WITHOUT CONTRAST CT CERVICAL SPINE WITHOUT CONTRAST  Technique:  Multidetector CT imaging of the head, cervical spine, and maxillofacial structures were performed using the standard protocol without intravenous contrast. Multiplanar CT image reconstructions of the cervical spine and maxillofacial structures were also generated.  Comparison:   None  CT HEAD  Findings: Mild cerebral and cerebellar atrophy.  Mild physiologic calcifications in the basal ganglia bilaterally (left greater than right).  Patchy and confluent areas of decreased attenuation throughout the deep and periventricular white matter of the  cerebral hemispheres bilaterally, compatible with chronic microvascular ischemic disease.  Tiny focus of calcification in the right parietal lobe (image 14 of series 17), presumably related to prior infection or trauma.  No acute displaced skull fractures are identified.  No acute intracranial abnormality.  Specifically, no evidence of acute post-traumatic intracranial hemorrhage, no definite regions of acute/subacute cerebral ischemia, no focal mass, mass effect, hydrocephalus or abnormal intra or extra-axial fluid collections. Mastoids are well pneumatized.  There is multi focal mucosal thickening throughout the left ethmoid and sphenoid sinuses, with the posterior left sphenoid sinus completely opacified with high attenuation material (likely inspissated secretions).  IMPRESSION: 1.  No acute displaced skull fractures or acute intracranial abnormalities. 2. Mild cerebral and cerebellar atrophy with chronic microvascular ischemic changes in the cerebral white matter, as above. 3.  Paranasal sinus disease, as above.  CT MAXILLOFACIAL  Findings:  Paranasal sinus disease as discussed above.  No acute displaced facial bone fractures.  Pterygoid plates are intact. Mandibular condyles are located bilaterally.  Advanced degenerative changes are noted in the right mandibular head and condylar process which appears fragmented (this is likely chronic).  There is an area of severe soft tissue swelling overlying the bridge of the nose predominately on the right side, and in particular in the anterior aspect of the nose where there is a 2.5 x 1.6 cm collection of high attenuation material which likely represents a hematoma.  IMPRESSION: 1.  Large hematoma in the distal aspect of the nose, with some soft tissue swelling overlying the nasal bridge, particularly on the right side.  No underlying acute displaced facial bone fractures are identified. 2. Sclerosis and fragmentation of the right mandibular  head and condylar process  which appears to be chronic and degenerative. 3.  Paranasal sinus disease as previously described.  CT CERVICAL SPINE  Findings:   No definite acute displaced fractures of the cervical spine.  There are multilevel advanced degenerative changes, which results in some reversal of the normal cervical lordosis, centered at the level of C5, however, this is favored to be chronic. Specifically, there is advanced degenerative disc disease at multiple levels, most severe at C4-C5, C5-C6 and C6-C7.  Chronic compression fracture of C5 with approximately 20% loss of anterior vertebral body height.  3 mm of anterolisthesis of C3 upon C4.  6 mm of anterolisthesis of C4 on C5.  A 3 mm retrolisthesis of C5 upon C6 and 3 mm of retrolisthesis of C6 upon C7.  Prevertebral soft tissues are normal.  Multilevel facet arthropathy is also noted.  Advanced degenerative changes at the atlantoaxial joint. Visualized portions of the thorax around.  IMPRESSION: 1.  No definite evidence of significant acute traumatic injury to the cervical spine. 2.  Severe multilevel degenerative disc disease and cervical spondylosis with chronic malalignment of the cervical spine, as above.   Original Report Authenticated By: Vinnie Langton, M.D.   Ct Cervical Spine Wo Contrast  07/06/2012   *RADIOLOGY REPORT*  Clinical Data:  History of trauma from a fall.  CT HEAD WITHOUT CONTRAST CT MAXILLOFACIAL WITHOUT CONTRAST CT CERVICAL SPINE WITHOUT CONTRAST  Technique:  Multidetector CT imaging of the head, cervical spine, and maxillofacial structures were performed using the standard protocol without intravenous contrast. Multiplanar CT image reconstructions of the cervical spine and maxillofacial structures were also generated.  Comparison:   None  CT HEAD  Findings: Mild cerebral and cerebellar atrophy.  Mild physiologic calcifications in the basal ganglia bilaterally (left greater than right).  Patchy and confluent areas of decreased attenuation throughout  the deep and periventricular white matter of the cerebral hemispheres bilaterally, compatible with chronic microvascular ischemic disease.  Tiny focus of calcification in the right parietal lobe (image 14 of series 17), presumably related to prior infection or trauma.  No acute displaced skull fractures are identified.  No acute intracranial abnormality.  Specifically, no evidence of acute post-traumatic intracranial hemorrhage, no definite regions of acute/subacute cerebral ischemia, no focal mass, mass effect, hydrocephalus or abnormal intra or extra-axial fluid collections. Mastoids are well pneumatized.  There is multi focal mucosal thickening throughout the left ethmoid and sphenoid sinuses, with the posterior left sphenoid sinus completely opacified with high attenuation material (likely inspissated secretions).  IMPRESSION: 1.  No acute displaced skull fractures or acute intracranial abnormalities. 2. Mild cerebral and cerebellar atrophy with chronic microvascular ischemic changes in the cerebral white matter, as above. 3.  Paranasal sinus disease, as above.  CT MAXILLOFACIAL  Findings:  Paranasal sinus disease as discussed above.  No acute displaced facial bone fractures.  Pterygoid plates are intact. Mandibular condyles are located bilaterally.  Advanced degenerative changes are noted in the right mandibular head and condylar process which appears fragmented (this is likely chronic).  There is an area of severe soft tissue swelling overlying the bridge of the nose predominately on the right side, and in particular in the anterior aspect of the nose where there is a 2.5 x 1.6 cm collection of high attenuation material which likely represents a hematoma.  IMPRESSION: 1.  Large hematoma in the distal aspect of the nose, with some soft tissue swelling overlying the nasal bridge, particularly on the right side.  No underlying acute  displaced facial bone fractures are identified. 2. Sclerosis and fragmentation of  the right mandibular head and condylar process which appears to be chronic and degenerative. 3.  Paranasal sinus disease as previously described.  CT CERVICAL SPINE  Findings:   No definite acute displaced fractures of the cervical spine.  There are multilevel advanced degenerative changes, which results in some reversal of the normal cervical lordosis, centered at the level of C5, however, this is favored to be chronic. Specifically, there is advanced degenerative disc disease at multiple levels, most severe at C4-C5, C5-C6 and C6-C7.  Chronic compression fracture of C5 with approximately 20% loss of anterior vertebral body height.  3 mm of anterolisthesis of C3 upon C4.  6 mm of anterolisthesis of C4 on C5.  A 3 mm retrolisthesis of C5 upon C6 and 3 mm of retrolisthesis of C6 upon C7.  Prevertebral soft tissues are normal.  Multilevel facet arthropathy is also noted.  Advanced degenerative changes at the atlantoaxial joint. Visualized portions of the thorax around.  IMPRESSION: 1.  No definite evidence of significant acute traumatic injury to the cervical spine. 2.  Severe multilevel degenerative disc disease and cervical spondylosis with chronic malalignment of the cervical spine, as above.   Original Report Authenticated By: Vinnie Langton, M.D.   Ct Maxillofacial Wo Cm  07/06/2012   *RADIOLOGY REPORT*  Clinical Data:  History of trauma from a fall.  CT HEAD WITHOUT CONTRAST CT MAXILLOFACIAL WITHOUT CONTRAST CT CERVICAL SPINE WITHOUT CONTRAST  Technique:  Multidetector CT imaging of the head, cervical spine, and maxillofacial structures were performed using the standard protocol without intravenous contrast. Multiplanar CT image reconstructions of the cervical spine and maxillofacial structures were also generated.  Comparison:   None  CT HEAD  Findings: Mild cerebral and cerebellar atrophy.  Mild physiologic calcifications in the basal ganglia bilaterally (left greater than right).  Patchy and confluent  areas of decreased attenuation throughout the deep and periventricular white matter of the cerebral hemispheres bilaterally, compatible with chronic microvascular ischemic disease.  Tiny focus of calcification in the right parietal lobe (image 14 of series 17), presumably related to prior infection or trauma.  No acute displaced skull fractures are identified.  No acute intracranial abnormality.  Specifically, no evidence of acute post-traumatic intracranial hemorrhage, no definite regions of acute/subacute cerebral ischemia, no focal mass, mass effect, hydrocephalus or abnormal intra or extra-axial fluid collections. Mastoids are well pneumatized.  There is multi focal mucosal thickening throughout the left ethmoid and sphenoid sinuses, with the posterior left sphenoid sinus completely opacified with high attenuation material (likely inspissated secretions).  IMPRESSION: 1.  No acute displaced skull fractures or acute intracranial abnormalities. 2. Mild cerebral and cerebellar atrophy with chronic microvascular ischemic changes in the cerebral white matter, as above. 3.  Paranasal sinus disease, as above.  CT MAXILLOFACIAL  Findings:  Paranasal sinus disease as discussed above.  No acute displaced facial bone fractures.  Pterygoid plates are intact. Mandibular condyles are located bilaterally.  Advanced degenerative changes are noted in the right mandibular head and condylar process which appears fragmented (this is likely chronic).  There is an area of severe soft tissue swelling overlying the bridge of the nose predominately on the right side, and in particular in the anterior aspect of the nose where there is a 2.5 x 1.6 cm collection of high attenuation material which likely represents a hematoma.  IMPRESSION: 1.  Large hematoma in the distal aspect of the nose, with some soft tissue swelling  overlying the nasal bridge, particularly on the right side.  No underlying acute displaced facial bone fractures are  identified. 2. Sclerosis and fragmentation of the right mandibular head and condylar process which appears to be chronic and degenerative. 3.  Paranasal sinus disease as previously described.  CT CERVICAL SPINE  Findings:   No definite acute displaced fractures of the cervical spine.  There are multilevel advanced degenerative changes, which results in some reversal of the normal cervical lordosis, centered at the level of C5, however, this is favored to be chronic. Specifically, there is advanced degenerative disc disease at multiple levels, most severe at C4-C5, C5-C6 and C6-C7.  Chronic compression fracture of C5 with approximately 20% loss of anterior vertebral body height.  3 mm of anterolisthesis of C3 upon C4.  6 mm of anterolisthesis of C4 on C5.  A 3 mm retrolisthesis of C5 upon C6 and 3 mm of retrolisthesis of C6 upon C7.  Prevertebral soft tissues are normal.  Multilevel facet arthropathy is also noted.  Advanced degenerative changes at the atlantoaxial joint. Visualized portions of the thorax around.  IMPRESSION: 1.  No definite evidence of significant acute traumatic injury to the cervical spine. 2.  Severe multilevel degenerative disc disease and cervical spondylosis with chronic malalignment of the cervical spine, as above.   Original Report Authenticated By: Vinnie Langton, M.D.     1. Fall, initial encounter   2. Nasal septal hematoma, initial encounter   3. Hematoma of face, initial encounter       MDM  Patient with fall on Coumadin. Swelling of nose. Septal hematoma was drained with a 11 blade. no fractures on CT. After discussion with ENT and he'll follow up with her in the office this week.    Jasper Riling. Alvino Chapel, MD 07/07/12 [id number]

## 2012-07-21 DIAGNOSIS — I10 Essential (primary) hypertension: Secondary | ICD-10-CM | POA: Diagnosis not present

## 2012-07-21 DIAGNOSIS — R0609 Other forms of dyspnea: Secondary | ICD-10-CM | POA: Diagnosis not present

## 2012-07-21 DIAGNOSIS — R609 Edema, unspecified: Secondary | ICD-10-CM | POA: Diagnosis not present

## 2012-07-21 DIAGNOSIS — Z1331 Encounter for screening for depression: Secondary | ICD-10-CM | POA: Diagnosis not present

## 2012-07-21 DIAGNOSIS — M810 Age-related osteoporosis without current pathological fracture: Secondary | ICD-10-CM | POA: Diagnosis not present

## 2012-07-21 DIAGNOSIS — R7301 Impaired fasting glucose: Secondary | ICD-10-CM | POA: Diagnosis not present

## 2012-07-21 DIAGNOSIS — T148XXA Other injury of unspecified body region, initial encounter: Secondary | ICD-10-CM | POA: Diagnosis not present

## 2012-07-21 DIAGNOSIS — J069 Acute upper respiratory infection, unspecified: Secondary | ICD-10-CM | POA: Diagnosis not present

## 2012-07-21 DIAGNOSIS — R0989 Other specified symptoms and signs involving the circulatory and respiratory systems: Secondary | ICD-10-CM | POA: Diagnosis not present

## 2012-07-21 DIAGNOSIS — I4891 Unspecified atrial fibrillation: Secondary | ICD-10-CM | POA: Diagnosis not present

## 2012-07-22 DIAGNOSIS — R040 Epistaxis: Secondary | ICD-10-CM | POA: Diagnosis not present

## 2012-07-22 DIAGNOSIS — J3489 Other specified disorders of nose and nasal sinuses: Secondary | ICD-10-CM | POA: Diagnosis not present

## 2012-07-24 DIAGNOSIS — T792XXA Traumatic secondary and recurrent hemorrhage and seroma, initial encounter: Secondary | ICD-10-CM | POA: Diagnosis not present

## 2012-07-24 DIAGNOSIS — J3489 Other specified disorders of nose and nasal sinuses: Secondary | ICD-10-CM | POA: Diagnosis not present

## 2012-07-28 DIAGNOSIS — I4891 Unspecified atrial fibrillation: Secondary | ICD-10-CM | POA: Diagnosis not present

## 2012-07-28 DIAGNOSIS — Z7901 Long term (current) use of anticoagulants: Secondary | ICD-10-CM | POA: Diagnosis not present

## 2012-08-11 DIAGNOSIS — R7989 Other specified abnormal findings of blood chemistry: Secondary | ICD-10-CM | POA: Diagnosis not present

## 2012-08-11 DIAGNOSIS — R609 Edema, unspecified: Secondary | ICD-10-CM | POA: Diagnosis not present

## 2012-08-11 DIAGNOSIS — Z7901 Long term (current) use of anticoagulants: Secondary | ICD-10-CM | POA: Diagnosis not present

## 2012-08-11 DIAGNOSIS — I4891 Unspecified atrial fibrillation: Secondary | ICD-10-CM | POA: Diagnosis not present

## 2012-08-19 DIAGNOSIS — R609 Edema, unspecified: Secondary | ICD-10-CM | POA: Diagnosis not present

## 2012-08-19 DIAGNOSIS — E871 Hypo-osmolality and hyponatremia: Secondary | ICD-10-CM | POA: Diagnosis not present

## 2012-08-19 DIAGNOSIS — Z6826 Body mass index (BMI) 26.0-26.9, adult: Secondary | ICD-10-CM | POA: Diagnosis not present

## 2012-08-19 DIAGNOSIS — I10 Essential (primary) hypertension: Secondary | ICD-10-CM | POA: Diagnosis not present

## 2012-09-03 DIAGNOSIS — M950 Acquired deformity of nose: Secondary | ICD-10-CM | POA: Diagnosis not present

## 2012-09-03 DIAGNOSIS — J3489 Other specified disorders of nose and nasal sinuses: Secondary | ICD-10-CM | POA: Diagnosis not present

## 2012-09-08 DIAGNOSIS — I4891 Unspecified atrial fibrillation: Secondary | ICD-10-CM | POA: Diagnosis not present

## 2012-09-08 DIAGNOSIS — Z7901 Long term (current) use of anticoagulants: Secondary | ICD-10-CM | POA: Diagnosis not present

## 2012-09-21 ENCOUNTER — Emergency Department (HOSPITAL_COMMUNITY): Payer: Medicare Other

## 2012-09-21 ENCOUNTER — Inpatient Hospital Stay (HOSPITAL_COMMUNITY): Payer: Medicare Other

## 2012-09-21 ENCOUNTER — Inpatient Hospital Stay (HOSPITAL_COMMUNITY): Payer: Medicare Other | Admitting: Certified Registered Nurse Anesthetist

## 2012-09-21 ENCOUNTER — Encounter (HOSPITAL_COMMUNITY): Payer: Self-pay | Admitting: Certified Registered Nurse Anesthetist

## 2012-09-21 ENCOUNTER — Inpatient Hospital Stay (HOSPITAL_COMMUNITY)
Admission: EM | Admit: 2012-09-21 | Discharge: 2012-09-24 | DRG: 481 | Disposition: A | Discharge status: Skilled Nursing Facility | Payer: Medicare Other | Attending: Internal Medicine | Admitting: Internal Medicine

## 2012-09-21 ENCOUNTER — Encounter (HOSPITAL_COMMUNITY): Payer: Self-pay | Admitting: Internal Medicine

## 2012-09-21 ENCOUNTER — Encounter (HOSPITAL_COMMUNITY)
Admission: EM | Disposition: A | Discharge status: Skilled Nursing Facility | Payer: Self-pay | Source: Home / Self Care | Attending: Internal Medicine

## 2012-09-21 DIAGNOSIS — I341 Nonrheumatic mitral (valve) prolapse: Secondary | ICD-10-CM | POA: Diagnosis present

## 2012-09-21 DIAGNOSIS — R262 Difficulty in walking, not elsewhere classified: Secondary | ICD-10-CM | POA: Diagnosis not present

## 2012-09-21 DIAGNOSIS — E785 Hyperlipidemia, unspecified: Secondary | ICD-10-CM | POA: Diagnosis present

## 2012-09-21 DIAGNOSIS — M4120 Other idiopathic scoliosis, site unspecified: Secondary | ICD-10-CM | POA: Diagnosis present

## 2012-09-21 DIAGNOSIS — IMO0002 Reserved for concepts with insufficient information to code with codable children: Secondary | ICD-10-CM | POA: Diagnosis not present

## 2012-09-21 DIAGNOSIS — M6281 Muscle weakness (generalized): Secondary | ICD-10-CM | POA: Diagnosis not present

## 2012-09-21 DIAGNOSIS — M549 Dorsalgia, unspecified: Secondary | ICD-10-CM | POA: Diagnosis present

## 2012-09-21 DIAGNOSIS — M810 Age-related osteoporosis without current pathological fracture: Secondary | ICD-10-CM | POA: Diagnosis present

## 2012-09-21 DIAGNOSIS — R279 Unspecified lack of coordination: Secondary | ICD-10-CM | POA: Diagnosis not present

## 2012-09-21 DIAGNOSIS — Z7982 Long term (current) use of aspirin: Secondary | ICD-10-CM | POA: Diagnosis not present

## 2012-09-21 DIAGNOSIS — M1990 Unspecified osteoarthritis, unspecified site: Secondary | ICD-10-CM | POA: Diagnosis present

## 2012-09-21 DIAGNOSIS — M4302 Spondylolysis, cervical region: Secondary | ICD-10-CM

## 2012-09-21 DIAGNOSIS — S72009A Fracture of unspecified part of neck of unspecified femur, initial encounter for closed fracture: Secondary | ICD-10-CM | POA: Diagnosis not present

## 2012-09-21 DIAGNOSIS — W06XXXA Fall from bed, initial encounter: Secondary | ICD-10-CM | POA: Diagnosis not present

## 2012-09-21 DIAGNOSIS — I059 Rheumatic mitral valve disease, unspecified: Secondary | ICD-10-CM | POA: Diagnosis present

## 2012-09-21 DIAGNOSIS — I495 Sick sinus syndrome: Secondary | ICD-10-CM | POA: Diagnosis not present

## 2012-09-21 DIAGNOSIS — I10 Essential (primary) hypertension: Secondary | ICD-10-CM | POA: Diagnosis present

## 2012-09-21 DIAGNOSIS — D62 Acute posthemorrhagic anemia: Secondary | ICD-10-CM | POA: Diagnosis not present

## 2012-09-21 DIAGNOSIS — T45515A Adverse effect of anticoagulants, initial encounter: Secondary | ICD-10-CM | POA: Diagnosis present

## 2012-09-21 DIAGNOSIS — Z9181 History of falling: Secondary | ICD-10-CM | POA: Diagnosis not present

## 2012-09-21 DIAGNOSIS — Z853 Personal history of malignant neoplasm of breast: Secondary | ICD-10-CM | POA: Diagnosis not present

## 2012-09-21 DIAGNOSIS — R791 Abnormal coagulation profile: Secondary | ICD-10-CM | POA: Diagnosis not present

## 2012-09-21 DIAGNOSIS — S72143A Displaced intertrochanteric fracture of unspecified femur, initial encounter for closed fracture: Secondary | ICD-10-CM | POA: Diagnosis not present

## 2012-09-21 DIAGNOSIS — F411 Generalized anxiety disorder: Secondary | ICD-10-CM | POA: Diagnosis not present

## 2012-09-21 DIAGNOSIS — E559 Vitamin D deficiency, unspecified: Secondary | ICD-10-CM

## 2012-09-21 DIAGNOSIS — Z7901 Long term (current) use of anticoagulants: Secondary | ICD-10-CM | POA: Diagnosis not present

## 2012-09-21 DIAGNOSIS — Z01818 Encounter for other preprocedural examination: Secondary | ICD-10-CM | POA: Diagnosis not present

## 2012-09-21 DIAGNOSIS — G8929 Other chronic pain: Secondary | ICD-10-CM | POA: Diagnosis present

## 2012-09-21 DIAGNOSIS — D72829 Elevated white blood cell count, unspecified: Secondary | ICD-10-CM | POA: Diagnosis not present

## 2012-09-21 DIAGNOSIS — I4891 Unspecified atrial fibrillation: Secondary | ICD-10-CM | POA: Diagnosis not present

## 2012-09-21 DIAGNOSIS — Q7649 Other congenital malformations of spine, not associated with scoliosis: Secondary | ICD-10-CM | POA: Diagnosis not present

## 2012-09-21 DIAGNOSIS — I2510 Atherosclerotic heart disease of native coronary artery without angina pectoris: Secondary | ICD-10-CM | POA: Diagnosis present

## 2012-09-21 DIAGNOSIS — S72002A Fracture of unspecified part of neck of left femur, initial encounter for closed fracture: Secondary | ICD-10-CM

## 2012-09-21 DIAGNOSIS — D68318 Other hemorrhagic disorder due to intrinsic circulating anticoagulants, antibodies, or inhibitors: Secondary | ICD-10-CM | POA: Diagnosis not present

## 2012-09-21 DIAGNOSIS — R1312 Dysphagia, oropharyngeal phase: Secondary | ICD-10-CM | POA: Diagnosis not present

## 2012-09-21 DIAGNOSIS — C50919 Malignant neoplasm of unspecified site of unspecified female breast: Secondary | ICD-10-CM

## 2012-09-21 DIAGNOSIS — T148XXA Other injury of unspecified body region, initial encounter: Secondary | ICD-10-CM | POA: Diagnosis not present

## 2012-09-21 DIAGNOSIS — R0902 Hypoxemia: Secondary | ICD-10-CM | POA: Diagnosis present

## 2012-09-21 DIAGNOSIS — J189 Pneumonia, unspecified organism: Secondary | ICD-10-CM | POA: Diagnosis not present

## 2012-09-21 DIAGNOSIS — I480 Paroxysmal atrial fibrillation: Secondary | ICD-10-CM | POA: Diagnosis present

## 2012-09-21 DIAGNOSIS — I498 Other specified cardiac arrhythmias: Secondary | ICD-10-CM | POA: Diagnosis not present

## 2012-09-21 DIAGNOSIS — E871 Hypo-osmolality and hyponatremia: Secondary | ICD-10-CM | POA: Diagnosis not present

## 2012-09-21 DIAGNOSIS — M25559 Pain in unspecified hip: Secondary | ICD-10-CM | POA: Diagnosis not present

## 2012-09-21 DIAGNOSIS — S73006A Unspecified dislocation of unspecified hip, initial encounter: Secondary | ICD-10-CM | POA: Diagnosis not present

## 2012-09-21 DIAGNOSIS — S72002K Fracture of unspecified part of neck of left femur, subsequent encounter for closed fracture with nonunion: Secondary | ICD-10-CM

## 2012-09-21 DIAGNOSIS — S72009D Fracture of unspecified part of neck of unspecified femur, subsequent encounter for closed fracture with routine healing: Secondary | ICD-10-CM | POA: Diagnosis not present

## 2012-09-21 DIAGNOSIS — M47812 Spondylosis without myelopathy or radiculopathy, cervical region: Secondary | ICD-10-CM | POA: Diagnosis not present

## 2012-09-21 HISTORY — DX: Spondylolysis, cervical region: M43.02

## 2012-09-21 HISTORY — DX: Hyperlipidemia, unspecified: E78.5

## 2012-09-21 HISTORY — DX: Unspecified osteoarthritis, unspecified site: M19.90

## 2012-09-21 HISTORY — PX: FEMUR IM NAIL: SHX1597

## 2012-09-21 HISTORY — DX: Reserved for concepts with insufficient information to code with codable children: IMO0002

## 2012-09-21 HISTORY — DX: Nonrheumatic mitral (valve) prolapse: I34.1

## 2012-09-21 LAB — URINALYSIS, ROUTINE W REFLEX MICROSCOPIC
Glucose, UA: NEGATIVE mg/dL
Ketones, ur: NEGATIVE mg/dL
Nitrite: NEGATIVE
Protein, ur: NEGATIVE mg/dL
pH: 7 (ref 5.0–8.0)

## 2012-09-21 LAB — CBC WITH DIFFERENTIAL/PLATELET
Eosinophils Absolute: 0.1 10*3/uL (ref 0.0–0.7)
Eosinophils Relative: 1 % (ref 0–5)
HCT: 34.1 % — ABNORMAL LOW (ref 36.0–46.0)
Lymphocytes Relative: 7 % — ABNORMAL LOW (ref 12–46)
Lymphs Abs: 0.9 10*3/uL (ref 0.7–4.0)
MCH: 34.1 pg — ABNORMAL HIGH (ref 26.0–34.0)
MCV: 100.3 fL — ABNORMAL HIGH (ref 78.0–100.0)
Monocytes Absolute: 0.9 10*3/uL (ref 0.1–1.0)
Monocytes Relative: 7 % (ref 3–12)
RBC: 3.4 MIL/uL — ABNORMAL LOW (ref 3.87–5.11)
WBC: 12.8 10*3/uL — ABNORMAL HIGH (ref 4.0–10.5)

## 2012-09-21 LAB — URINE MICROSCOPIC-ADD ON

## 2012-09-21 LAB — BASIC METABOLIC PANEL
CO2: 29 mEq/L (ref 19–32)
Calcium: 9 mg/dL (ref 8.4–10.5)
Chloride: 98 mEq/L (ref 96–112)
Glucose, Bld: 131 mg/dL — ABNORMAL HIGH (ref 70–99)
Sodium: 134 mEq/L — ABNORMAL LOW (ref 135–145)

## 2012-09-21 LAB — PROTIME-INR
INR: 2.02 — ABNORMAL HIGH (ref 0.00–1.49)
Prothrombin Time: 22.2 seconds — ABNORMAL HIGH (ref 11.6–15.2)

## 2012-09-21 LAB — SURGICAL PCR SCREEN
MRSA, PCR: NEGATIVE
Staphylococcus aureus: NEGATIVE

## 2012-09-21 SURGERY — INSERTION, INTRAMEDULLARY ROD, FEMUR
Anesthesia: General | Laterality: Left | Wound class: Clean

## 2012-09-21 MED ORDER — OXYCODONE-ACETAMINOPHEN 5-325 MG PO TABS
1.0000 | ORAL_TABLET | ORAL | Status: DC | PRN
  Filled 2012-09-21: qty 2

## 2012-09-21 MED ORDER — 0.9 % SODIUM CHLORIDE (POUR BTL) OPTIME
TOPICAL | Status: DC | PRN
  Administered 2012-09-21: 1000 mL

## 2012-09-21 MED ORDER — PROMETHAZINE HCL 25 MG/ML IJ SOLN: 6.2500 mg | INTRAMUSCULAR | Status: DC | PRN

## 2012-09-21 MED ORDER — ONDANSETRON HCL 4 MG/2ML IJ SOLN
4.0000 mg | Freq: Once | INTRAMUSCULAR | Status: AC
  Administered 2012-09-21: 4 mg via INTRAVENOUS
  Filled 2012-09-21: qty 2

## 2012-09-21 MED ORDER — EPHEDRINE SULFATE 50 MG/ML IJ SOLN
INTRAMUSCULAR | Status: DC | PRN
  Administered 2012-09-21: 10 mg via INTRAVENOUS

## 2012-09-21 MED ORDER — CEFAZOLIN SODIUM 1-5 GM-% IV SOLN
1.0000 g | Freq: Four times a day (QID) | INTRAVENOUS | Status: AC
  Administered 2012-09-21 – 2012-09-22 (×3): 1 g via INTRAVENOUS
  Filled 2012-09-21 (×3): qty 50

## 2012-09-21 MED ORDER — FENTANYL CITRATE 0.05 MG/ML IJ SOLN
INTRAMUSCULAR | Status: DC | PRN
  Administered 2012-09-21 (×2): 25 ug via INTRAVENOUS

## 2012-09-21 MED ORDER — ALPRAZOLAM 0.5 MG PO TABS
0.5000 mg | ORAL_TABLET | Freq: Three times a day (TID) | ORAL | Status: DC | PRN
  Administered 2012-09-22: 22:00:00 0.5 mg via ORAL
  Filled 2012-09-21: qty 1

## 2012-09-21 MED ORDER — PANTOPRAZOLE SODIUM 40 MG PO TBEC
40.0000 mg | DELAYED_RELEASE_TABLET | Freq: Every day | ORAL | Status: DC
  Administered 2012-09-22 – 2012-09-24 (×3): 40 mg via ORAL
  Filled 2012-09-21 (×3): qty 1

## 2012-09-21 MED ORDER — SODIUM CHLORIDE 0.9 % IV SOLN
INTRAVENOUS | Status: DC
  Administered 2012-09-21: 17:00:00 via INTRAVENOUS

## 2012-09-21 MED ORDER — WARFARIN SODIUM 4 MG PO TABS
4.0000 mg | ORAL_TABLET | Freq: Once | ORAL | Status: DC
  Filled 2012-09-21: qty 1

## 2012-09-21 MED ORDER — METOCLOPRAMIDE HCL 5 MG/ML IJ SOLN: 5.0000 mg | Freq: Three times a day (TID) | INTRAMUSCULAR | Status: DC | PRN

## 2012-09-21 MED ORDER — DEXAMETHASONE SODIUM PHOSPHATE 10 MG/ML IJ SOLN
INTRAMUSCULAR | Status: DC | PRN
  Administered 2012-09-21: 10 mg via INTRAVENOUS

## 2012-09-21 MED ORDER — METOCLOPRAMIDE HCL 10 MG PO TABS: 5.0000 mg | ORAL_TABLET | Freq: Three times a day (TID) | ORAL | Status: DC | PRN

## 2012-09-21 MED ORDER — FENTANYL CITRATE 0.05 MG/ML IJ SOLN
50.0000 ug | Freq: Once | INTRAMUSCULAR | Status: AC
  Administered 2012-09-21: 50 ug via INTRAVENOUS
  Filled 2012-09-21: qty 2

## 2012-09-21 MED ORDER — BIOTENE DRY MOUTH MT LIQD
15.0000 mL | Freq: Two times a day (BID) | OROMUCOSAL | Status: DC
  Administered 2012-09-21 – 2012-09-24 (×7): 15 mL via OROMUCOSAL

## 2012-09-21 MED ORDER — ASPIRIN EC 81 MG PO TBEC
81.0000 mg | DELAYED_RELEASE_TABLET | Freq: Every morning | ORAL | Status: DC
Start: 2012-09-22 — End: 2012-09-24
  Administered 2012-09-23 – 2012-09-24 (×2): 81 mg via ORAL
  Filled 2012-09-21 (×3): qty 1

## 2012-09-21 MED ORDER — OXYCODONE HCL 5 MG PO TABS
5.0000 mg | ORAL_TABLET | ORAL | Status: DC | PRN
  Administered 2012-09-23: 5 mg via ORAL
  Filled 2012-09-21: qty 1

## 2012-09-21 MED ORDER — VITAMIN K1 10 MG/ML IJ SOLN
5.0000 mg | Freq: Once | INTRAMUSCULAR | Status: DC
  Filled 2012-09-21: qty 0.5

## 2012-09-21 MED ORDER — FENTANYL CITRATE 0.05 MG/ML IJ SOLN: 25.0000 ug | INTRAMUSCULAR | Status: DC | PRN

## 2012-09-21 MED ORDER — ONDANSETRON HCL 4 MG/2ML IJ SOLN: 4.0000 mg | Freq: Three times a day (TID) | INTRAMUSCULAR | Status: AC | PRN

## 2012-09-21 MED ORDER — FERROUS SULFATE 325 (65 FE) MG PO TABS
325.0000 mg | ORAL_TABLET | Freq: Three times a day (TID) | ORAL | Status: DC
  Administered 2012-09-21 – 2012-09-24 (×8): 325 mg via ORAL
  Filled 2012-09-21 (×13): qty 1

## 2012-09-21 MED ORDER — AMIODARONE HCL 200 MG PO TABS
200.0000 mg | ORAL_TABLET | Freq: Every morning | ORAL | Status: DC
  Administered 2012-09-22 – 2012-09-24 (×3): 200 mg via ORAL
  Filled 2012-09-21 (×4): qty 1

## 2012-09-21 MED ORDER — ATORVASTATIN CALCIUM 40 MG PO TABS
40.0000 mg | ORAL_TABLET | Freq: Every evening | ORAL | Status: DC
  Administered 2012-09-21 – 2012-09-23 (×3): 40 mg via ORAL
  Filled 2012-09-21 (×4): qty 1

## 2012-09-21 MED ORDER — PHYTONADIONE 5 MG PO TABS
5.0000 mg | ORAL_TABLET | Freq: Once | ORAL | Status: AC
  Administered 2012-09-21: 14:00:00 5 mg via ORAL
  Filled 2012-09-21: qty 1

## 2012-09-21 MED ORDER — GLYCOPYRROLATE 0.2 MG/ML IJ SOLN
INTRAMUSCULAR | Status: DC | PRN
  Administered 2012-09-21: 0.2 mg via INTRAVENOUS

## 2012-09-21 MED ORDER — METOPROLOL TARTRATE 50 MG PO TABS
75.0000 mg | ORAL_TABLET | Freq: Two times a day (BID) | ORAL | Status: DC
  Administered 2012-09-21 – 2012-09-24 (×6): 75 mg via ORAL
  Filled 2012-09-21 (×8): qty 1

## 2012-09-21 MED ORDER — LACTATED RINGERS IV SOLN
INTRAVENOUS | Status: DC | PRN
  Administered 2012-09-21: 15:00:00 via INTRAVENOUS

## 2012-09-21 MED ORDER — FENTANYL CITRATE 0.05 MG/ML IJ SOLN
50.0000 ug | INTRAMUSCULAR | Status: AC | PRN
  Administered 2012-09-23 (×2): 50 ug via INTRAVENOUS

## 2012-09-21 MED ORDER — CEFAZOLIN SODIUM-DEXTROSE 2-3 GM-% IV SOLR
INTRAVENOUS | Status: AC
  Filled 2012-09-21: qty 50

## 2012-09-21 MED ORDER — ONDANSETRON HCL 4 MG/2ML IJ SOLN
INTRAMUSCULAR | Status: DC | PRN
  Administered 2012-09-21: 4 mg via INTRAVENOUS

## 2012-09-21 MED ORDER — ONDANSETRON HCL 4 MG/2ML IJ SOLN: 4.0000 mg | Freq: Four times a day (QID) | INTRAMUSCULAR | Status: DC | PRN

## 2012-09-21 MED ORDER — FENTANYL CITRATE 0.05 MG/ML IJ SOLN
50.0000 ug | INTRAMUSCULAR | Status: DC | PRN
  Administered 2012-09-21: 50 ug via INTRAVENOUS
  Filled 2012-09-21 (×3): qty 2

## 2012-09-21 MED ORDER — POLYETHYLENE GLYCOL 3350 17 G PO PACK
17.0000 g | PACK | Freq: Every morning | ORAL | Status: DC
  Administered 2012-09-22 – 2012-09-24 (×3): 17 g via ORAL
  Filled 2012-09-21 (×4): qty 1

## 2012-09-21 MED ORDER — WARFARIN - PHARMACIST DOSING INPATIENT: Freq: Every day | Status: DC

## 2012-09-21 MED ORDER — LIDOCAINE HCL (CARDIAC) 20 MG/ML IV SOLN
INTRAVENOUS | Status: DC | PRN
  Administered 2012-09-21: 100 mg via INTRAVENOUS

## 2012-09-21 MED ORDER — CEFAZOLIN SODIUM-DEXTROSE 2-3 GM-% IV SOLR
2.0000 g | INTRAVENOUS | Status: DC
  Filled 2012-09-21: qty 50

## 2012-09-21 MED ORDER — MORPHINE SULFATE 2 MG/ML IJ SOLN
0.5000 mg | INTRAMUSCULAR | Status: DC | PRN
  Administered 2012-09-21 – 2012-09-23 (×6): 0.5 mg via INTRAVENOUS
  Filled 2012-09-21 (×6): qty 1

## 2012-09-21 MED ORDER — PROPOFOL 10 MG/ML IV BOLUS
INTRAVENOUS | Status: DC | PRN
  Administered 2012-09-21: 80 mg via INTRAVENOUS

## 2012-09-21 MED ORDER — SUCCINYLCHOLINE CHLORIDE 20 MG/ML IJ SOLN
INTRAMUSCULAR | Status: DC | PRN
  Administered 2012-09-21: 70 mg via INTRAVENOUS

## 2012-09-21 MED ORDER — ONDANSETRON HCL 4 MG/2ML IJ SOLN
4.0000 mg | Freq: Four times a day (QID) | INTRAMUSCULAR | Status: DC | PRN
  Administered 2012-09-21: 4 mg via INTRAVENOUS
  Filled 2012-09-21: qty 2

## 2012-09-21 MED ORDER — TRAMADOL HCL 50 MG PO TABS
50.0000 mg | ORAL_TABLET | Freq: Three times a day (TID) | ORAL | Status: DC | PRN
  Administered 2012-09-22 – 2012-09-24 (×2): 50 mg via ORAL
  Filled 2012-09-21 (×2): qty 1

## 2012-09-21 MED ORDER — VITAMIN K1 10 MG/ML IJ SOLN
10.0000 mg | Freq: Once | INTRAVENOUS | Status: AC
  Administered 2012-09-21: 10 mg via INTRAVENOUS
  Filled 2012-09-21: qty 1

## 2012-09-21 MED ORDER — ONDANSETRON HCL 4 MG PO TABS: 4.0000 mg | ORAL_TABLET | Freq: Four times a day (QID) | ORAL | Status: DC | PRN

## 2012-09-21 SURGICAL SUPPLY — 38 items
BAG SPEC THK2 15X12 ZIP CLS (MISCELLANEOUS)
BAG ZIPLOCK 12X15 (MISCELLANEOUS) ×1 IMPLANT
BANDAGE ELASTIC 6 VELCRO ST LF (GAUZE/BANDAGES/DRESSINGS) ×1 IMPLANT
BIT DRILL CANN LG 4.3MM (BIT) IMPLANT
BLADE SURG 15 STRL LF DISP TIS (BLADE) ×1 IMPLANT
BLADE SURG 15 STRL SS (BLADE) ×2
BNDG COHESIVE 4X5 TAN STRL (GAUZE/BANDAGES/DRESSINGS) ×2 IMPLANT
BNDG COHESIVE 6X5 TAN STRL LF (GAUZE/BANDAGES/DRESSINGS) ×2 IMPLANT
CLOTH BEACON ORANGE TIMEOUT ST (SAFETY) ×2 IMPLANT
COVER SURGICAL LIGHT HANDLE (MISCELLANEOUS) ×1 IMPLANT
DRAPE LG THREE QUARTER DISP (DRAPES) ×4 IMPLANT
DRAPE STERI IOBAN 125X83 (DRAPES) ×2 IMPLANT
DRILL BIT CANN LG 4.3MM (BIT) ×2
DRSG ADAPTIC 3X8 NADH LF (GAUZE/BANDAGES/DRESSINGS) ×1 IMPLANT
DRSG MEPILEX BORDER 4X8 (GAUZE/BANDAGES/DRESSINGS) ×2 IMPLANT
DRSG PAD ABDOMINAL 8X10 ST (GAUZE/BANDAGES/DRESSINGS) ×1 IMPLANT
DURAPREP 26ML APPLICATOR (WOUND CARE) ×2 IMPLANT
ELECT REM PT RETURN 9FT ADLT (ELECTROSURGICAL)
ELECTRODE REM PT RTRN 9FT ADLT (ELECTROSURGICAL) ×1 IMPLANT
EVACUATOR 1/8 PVC DRAIN (DRAIN) ×1 IMPLANT
EVACUATOR SILICONE 100CC (DRAIN) ×1 IMPLANT
GLOVE BIOGEL PI IND STRL 8.5 (GLOVE) ×1 IMPLANT
GLOVE BIOGEL PI INDICATOR 8.5 (GLOVE) ×1
GLOVE SURG ORTHO 9.0 STRL STRW (GLOVE) ×2 IMPLANT
GUIDEPIN 3.2X17.5 THRD DISP (PIN) ×1 IMPLANT
KIT BASIN OR (CUSTOM PROCEDURE TRAY) ×2 IMPLANT
MANIFOLD NEPTUNE II (INSTRUMENTS) ×1 IMPLANT
NAIL HIP FRACT 130D 11X180 (Screw) ×1 IMPLANT
PACK GENERAL/GYN (CUSTOM PROCEDURE TRAY) ×2 IMPLANT
PAD CAST 4YDX4 CTTN HI CHSV (CAST SUPPLIES) ×2 IMPLANT
PADDING CAST COTTON 4X4 STRL (CAST SUPPLIES) ×4
POSITIONER SURGICAL ARM (MISCELLANEOUS) ×3 IMPLANT
SCREW BONE CORTICAL 5.0X3 (Screw) ×1 IMPLANT
SCREW LAG HIP NAIL 10.5X95 (Screw) ×1 IMPLANT
SPONGE GAUZE 4X4 12PLY (GAUZE/BANDAGES/DRESSINGS) ×1 IMPLANT
STAPLER VISISTAT 35W (STAPLE) ×2 IMPLANT
SUT VIC AB 2-0 CT1 27 (SUTURE) ×2
SUT VIC AB 2-0 CT1 TAPERPNT 27 (SUTURE) ×1 IMPLANT

## 2012-09-21 NOTE — Care Management Note (Signed)
    Page 1 of 1   09/21/2012     2:30:04 PM   CARE MANAGEMENT NOTE 09/21/2012  Patient:  Holly Weaver, Holly Weaver   Account Number:  [id number]  Date Initiated:  09/21/2012  Documentation initiated by:  Dessa Phi  Subjective/Objective Assessment:   79Y/O F ADMITTED W/FALL.L HIP FX.     Action/Plan:   FROM HOME.   Anticipated DC Date:  09/23/2012   Anticipated DC Plan:  [place]  CM consult      Choice offered to / List presented to:             Status of service:  In process, will continue to follow Medicare Important Message given?   (If response is "NO", the following Medicare IM given date fields will be blank) Date Medicare IM given:   Date Additional Medicare IM given:    Discharge Disposition:    Per UR Regulation:  Reviewed for med. necessity/level of care/duration of stay  If discussed at [place] of Stay Meetings, dates discussed:    Comments:  09/21/12 Jaimes Eckert RN,BSN NCM WEEKEND [place].WILL AWAIT PT/OT RECOMMENDATIONS POST SX.

## 2012-09-21 NOTE — ED Provider Notes (Addendum)
CSN: [id number]     Arrival date & time 09/21/12  0059 History   First MD Initiated Contact with Patient 09/21/12 0123     Chief Complaint  Patient presents with  . Fall  . Hip Pain    left hip   (Consider location/radiation/quality/duration/timing/severity/associated sxs/prior Treatment) HPI 79 yo female presents to the ER from home via EMS with complaint of fall, left hip pain and left lower leg deformity.  Pt reports she rolled off her bed in her sleep.  She also has bruising to her left upper arm.  Pt is on coumadin for afib, has h/o HTN, osteoporosis, scoliosis.  No prior orthopedic surgeries.  No head injury, no LOC  Past Medical History  Diagnosis Date  . Breast cancer   . Hypertension   . Osteoporosis   . Scoliosis   . Atrial fibrillation   . Compression fracture 09/21/2012  . Cervical spondylolysis 09/21/2012  . Osteoarthritis 09/21/2012  . Dyslipidemia 09/21/2012   Past Surgical History  Procedure Laterality Date  . Hernia repair     Family History  Problem Relation Age of Onset  . Cancer Mother   . CAD Father   . Cancer Other    History  Substance Use Topics  . Smoking status: Never Smoker   . Smokeless tobacco: Not on file  . Alcohol Use: No   OB History   Grav Para Term Preterm Abortions TAB SAB Ect Mult Living                 Review of Systems  All other systems reviewed and are negative.    Allergies  Codeine  Home Medications   Current Outpatient Rx  Name  Route  Sig  Dispense  Refill  . acetaminophen (TYLENOL) 325 MG tablet   Oral   Take 325 mg by mouth every 4 (four) hours as needed for pain.         Marland Kitchen alendronate (FOSAMAX) 70 MG tablet   Oral   Take 70 mg by mouth every 7 (seven) days. Take with a full glass of water on an empty stomach.         . ALPRAZolam (XANAX) 0.5 MG tablet   Oral   Take 0.5 mg by mouth every 8 (eight) hours as needed for anxiety.          Marland Kitchen amiodarone (PACERONE) 200 MG tablet   Oral   Take 200 mg by  mouth every morning.          Marland Kitchen amLODipine (NORVASC) 5 MG tablet   Oral   Take 5 mg by mouth every morning.          Marland Kitchen aspirin EC 81 MG tablet   Oral   Take 81 mg by mouth every morning.          Marland Kitchen atorvastatin (LIPITOR) 40 MG tablet   Oral   Take 40 mg by mouth every evening.          . calcium-vitamin D (OSCAL WITH D) 500-200 MG-UNIT per tablet   Oral   Take 1 tablet by mouth every morning.          Marland Kitchen letrozole (FEMARA) 2.5 MG tablet   Oral   Take 2.5 mg by mouth every morning.         . metoprolol (LOPRESSOR) 100 MG tablet   Oral   Take 100 mg by mouth 2 (two) times daily.         Marland Kitchen  Multiple Vitamin (MULTIVITAMIN WITH MINERALS) TABS   Oral   Take 1 tablet by mouth every morning. Centrum 50+         . olmesartan (BENICAR) 40 MG tablet   Oral   Take 40 mg by mouth daily.         Marland Kitchen omeprazole (PRILOSEC) 20 MG capsule   Oral   Take 20 mg by mouth daily as needed (for acid reduction).          . polyethylene glycol (MIRALAX / GLYCOLAX) packet   Oral   Take 17 g by mouth every morning.          . traMADol (ULTRAM) 50 MG tablet   Oral   Take 50 mg by mouth every 8 (eight) hours as needed for pain.          Marland Kitchen triamterene-hydrochlorothiazide (DYAZIDE) 37.5-25 MG per capsule   Oral   Take 1 capsule by mouth every morning.         . Vitamin D, Ergocalciferol, (DRISDOL) 50000 UNITS CAPS   Oral   Take 50,000 Units by mouth every 7 (seven) days.         Marland Kitchen warfarin (COUMADIN) 2.5 MG tablet   Oral   Take 2.5-3.75 mg by mouth every evening. Take 1.5 tabs on mon, and 1 tab on all other days          BP 157/119  Pulse 55  Temp(Src) 97.6 F (36.4 C) (Oral)  Resp 18  SpO2 99% Physical Exam  Nursing note and vitals reviewed. Constitutional: She is oriented to person, place, and time. She appears well-developed and well-nourished.  HENT:  Head: Normocephalic and atraumatic.  Nose: Nose normal.  Mouth/Throat: Oropharynx is clear and  moist.  Eyes: Conjunctivae and EOM are normal. Pupils are equal, round, and reactive to light.  Neck: Normal range of motion. Neck supple. No JVD present. No tracheal deviation present. No thyromegaly present.  Cardiovascular: Normal rate, regular rhythm, normal heart sounds and intact distal pulses.  Exam reveals no gallop and no friction rub.   No murmur heard. Pulmonary/Chest: Effort normal and breath sounds normal. No stridor. No respiratory distress. She has no wheezes. She has no rales. She exhibits no tenderness.  Abdominal: Soft. Bowel sounds are normal. She exhibits no distension and no mass. There is no tenderness. There is no rebound and no guarding.  Musculoskeletal: Normal range of motion. She exhibits no edema and no tenderness.  Left leg shortening, external rotation, pain with attempted ROM. Distally NVI.  Bruising noted to left upper lateral arm with abrasion, no active bleeding.  Normal ROM of elbow, shoulder  Lymphadenopathy:    She has no cervical adenopathy.  Neurological: She is oriented to person, place, and time. She has normal reflexes. No cranial nerve deficit. She exhibits normal muscle tone. Coordination normal.  Skin: Skin is dry. No rash noted. No erythema. No pallor.  Psychiatric: She has a normal mood and affect. Her behavior is normal. Judgment and thought content normal.    ED Course  Procedures (including critical care time) Labs Review Labs Reviewed  BASIC METABOLIC PANEL - Abnormal; Notable for the following:    Sodium 134 (*)    Glucose, Bld 131 (*)    GFR calc non Af Amer 53 (*)    GFR calc Af Amer 61 (*)    All other components within normal limits  CBC WITH DIFFERENTIAL - Abnormal; Notable for the following:    WBC 12.8 (*)  RBC 3.40 (*)    Hemoglobin 11.6 (*)    HCT 34.1 (*)    MCV 100.3 (*)    MCH 34.1 (*)    Neutrophils Relative % 85 (*)    Neutro Abs 10.9 (*)    Lymphocytes Relative 7 (*)    All other components within normal limits   PROTIME-INR - Abnormal; Notable for the following:    Prothrombin Time 30.5 (*)    INR 3.06 (*)    All other components within normal limits  URINALYSIS, ROUTINE W REFLEX MICROSCOPIC - Abnormal; Notable for the following:    APPearance CLOUDY (*)    Leukocytes, UA SMALL (*)    All other components within normal limits  URINE MICROSCOPIC-ADD ON  TYPE AND SCREEN   Imaging Review Dg Chest 1 View  09/21/2012   *RADIOLOGY REPORT*  Clinical Data: Fall with hip pain.  Preoperative chest x-ray.  CHEST - 1 VIEW  Comparison: 07/06/2012.  Findings: Chronic cardiomegaly.  Unchanged upper mediastinal contours.  Hiatal hernia, at least moderate in size.  Aortic atherosclerosis.  Hyperinflated lungs.  No acute infiltrate, edema, effusion, pneumothorax.  Osteopenia.  S-shaped scoliosis.  Two previous lower thoracic vertebral body compression fractures with bone augmentation.  IMPRESSION: 1. No evidence of acute cardiopulmonary disease. 2.  Chronic cardiomegaly. 3.  Probable COPD. 4.  Moderate hiatal hernia.   Original Report Authenticated By: Jorje Guild   Dg Hip Complete Left  09/21/2012   *RADIOLOGY REPORT*  Clinical Data: Fall with hip pain.  LEFT HIP - COMPLETE 2+ VIEW  Comparison: None.  Findings: Intertrochanteric left femur fracture with varus angulation.  The femoral head remains located.  As permitted by overlapping bowel gas, no acute pelvic ring fracture identified.  Severe osteopenia.  Lower lumbar degenerative disc disease.  IMPRESSION: Acute intertrochanteric left femur fracture with varus angulation.   Original Report Authenticated By: Jorje Guild    Date: 09/21/2012  Rate: 52  Rhythm: sinus bradycardia  QRS Axis: normal  Intervals: normal  ST/T Wave abnormalities: normal  Conduction Disutrbances:none  Narrative Interpretation:   Old EKG Reviewed: none available    MDM   1. Hip fracture, left, closed, initial encounter   2. Warfarin anticoagulation    79 yo female with left  hip fracture, elevated INR due to coumadin use.  D/w on call orthopedics, Dr Sharol Given, and also with hospitalist, Dr Posey Pronto.  Family and patient updated on findings and plan.        Kalman Drape, MD 09/21/12 ED:[id number]  Kalman Drape, MD 09/21/12 [phone]

## 2012-09-21 NOTE — ED Notes (Signed)
Patient is alert and oriented x3.  She is complaining of left hip pain post fall a few hours ago.

## 2012-09-21 NOTE — Op Note (Signed)
OPERATIVE REPORT  DATE OF SURGERY: 09/21/2012  PATIENT:  Holly Weaver,  78 y.o. female  PRE-OPERATIVE DIAGNOSIS:  left hip fracture intertrochanteric  POST-OPERATIVE DIAGNOSIS:  left hip fracture Intertrochanteric  PROCEDURE:  Procedure(s): INTRAMEDULLARY (IM) NAIL FEMORAL Biomet nail locked distally with a 34 mm screw with a proximal 95 mm barrel  SURGEON:  Surgeon(s): Newt Minion, MD  ANESTHESIA:   general  EBL:  Minimal ML  SPECIMEN:  No Specimen  TOURNIQUET:  * No tourniquets in log *  PROCEDURE DETAILS: Patient is an 79 year old woman who states that she fell out of bed onto her left hip sustaining a left intertrochanteric hip fracture. Patient was evaluated her INR was elevated 3 she received vitamin K this was brought down to 2 and she presents at this time for intramedullary nailing of the left hip fracture. Risks and benefits were discussed with the patient and her daughter including infection neurovascular injury pain DVT pulmonary embolus failure of fixation need for additional surgery. Patient daughter state they understand and wish to proceed at this time. Description of procedure patient brought to the operating room and underwent a general anesthetic. After adequate levels and anesthesia were obtained patient's left foot was placed in the boot traction and the right lower extremity was placed in the dorsolithotomy position the left hip was then prepped using DuraPrep and draped into a sterile field. A incision was made just proximal greater trochanter. A guidewire was inserted from the greater trochanter down the shaft this was then overdrilled the nail was inserted C-arm fluoroscopy verified reduction the guide pin was then placed center center in the anterior posterior position however the guidewire and slightly superior to do to impaction of the distal nail and the femoral canal. This was then overdrilled and a 95 mm barrel was inserted this was secured proximally.  Distally a 34 mm locking screw was placed. C-arm fluoroscopy verified reduction. The wounds were irrigated the subcutaneous is closed in 2-0 Vicryl the skin was closed using staples. Mepilex and ABDs dressing were applied patient was extubated taken to the PACU in stable condition.  PLAN OF CARE: Admit to inpatient   PATIENT DISPOSITION:  PACU - hemodynamically stable.   Newt Minion, MD 09/21/2012 3:58 PM

## 2012-09-21 NOTE — Progress Notes (Signed)
I have seen and assessed patient and agree with Dr Serita Grit assessment and plan.

## 2012-09-21 NOTE — Preoperative (Signed)
Beta Blockers   Reason not to administer Beta Blockers:Not Applicable Per request of Dr Rose-Pt to hold Beta Blocker this morning due to potential hemodynamic instability intra-op. Patient took Beta Blocker last night.

## 2012-09-21 NOTE — Consult Note (Signed)
Reason for Consult: Left intertrochanteric hip fracture Referring Physician: ED physician  [name] is an 79 y.o. female.  HPI: Patient is a 79 year old woman who states she fell out of bed last night on her left hip. Denies any head trauma or other injuries.  Past Medical History  Diagnosis Date  . Breast cancer   . Hypertension   . Osteoporosis   . Scoliosis   . Atrial fibrillation   . Compression fracture 09/21/2012  . Cervical spondylolysis 09/21/2012  . Osteoarthritis 09/21/2012  . Dyslipidemia 09/21/2012    Past Surgical History  Procedure Laterality Date  . Hernia repair      Family History  Problem Relation Age of Onset  . Cancer Mother   . CAD Father   . Cancer Other     Social History:  reports that she has never smoked. She does not have any smokeless tobacco history on file. She reports that she does not drink alcohol or use illicit drugs.  Allergies:  Allergies  Allergen Reactions  . Codeine Nausea And Vomiting    Medications: I have reviewed the patient's current medications.  Results for orders placed during the hospital encounter of 09/21/12 (from the past 48 hour(s))  URINALYSIS, ROUTINE W REFLEX MICROSCOPIC     Status: Abnormal   Collection Time    09/21/12  2:09 AM      Result Value Range   Color, Urine YELLOW  YELLOW   APPearance CLOUDY (*) CLEAR   Specific Gravity, Urine 1.016  1.005 - 1.030   pH 7.0  5.0 - 8.0   Glucose, UA NEGATIVE  NEGATIVE mg/dL   Hgb urine dipstick NEGATIVE  NEGATIVE   Bilirubin Urine NEGATIVE  NEGATIVE   Ketones, ur NEGATIVE  NEGATIVE mg/dL   Protein, ur NEGATIVE  NEGATIVE mg/dL   Urobilinogen, UA 1.0  0.0 - 1.0 mg/dL   Nitrite NEGATIVE  NEGATIVE   Leukocytes, UA SMALL (*) NEGATIVE  URINE MICROSCOPIC-ADD ON     Status: None   Collection Time    09/21/12  2:09 AM      Result Value Range   Squamous Epithelial / LPF RARE  RARE   WBC, UA 0-2  <3 WBC/hpf   RBC / HPF 0-2  <3 RBC/hpf   Bacteria, UA RARE  RARE    Urine-Other AMORPHOUS URATES/PHOSPHATES    BASIC METABOLIC PANEL     Status: Abnormal   Collection Time    09/21/12  2:33 AM      Result Value Range   Sodium 134 (*) 135 - 145 mEq/L   Potassium 3.9  3.5 - 5.1 mEq/L   Chloride 98  96 - 112 mEq/L   CO2 29  19 - 32 mEq/L   Glucose, Bld 131 (*) 70 - 99 mg/dL   BUN 14  6 - 23 mg/dL   Creatinine, Ser 0.94  0.50 - 1.10 mg/dL   Calcium 9.0  8.4 - 10.5 mg/dL   GFR calc non Af Amer 53 (*) >90 mL/min   GFR calc Af Amer 61 (*) >90 mL/min   Comment: (NOTE)     The eGFR has been calculated using the CKD EPI equation.     This calculation has not been validated in all clinical situations.     eGFR's persistently <90 mL/min signify possible Chronic Kidney     Disease.  CBC WITH DIFFERENTIAL     Status: Abnormal   Collection Time    09/21/12  2:33 AM  Result Value Range   WBC 12.8 (*) 4.0 - 10.5 K/uL   RBC 3.40 (*) 3.87 - 5.11 MIL/uL   Hemoglobin 11.6 (*) 12.0 - 15.0 g/dL   HCT 34.1 (*) 36.0 - 46.0 %   MCV 100.3 (*) 78.0 - 100.0 fL   MCH 34.1 (*) 26.0 - 34.0 pg   MCHC 34.0  30.0 - 36.0 g/dL   RDW 14.7  11.5 - 15.5 %   Platelets 177  150 - 400 K/uL   Neutrophils Relative % 85 (*) 43 - 77 %   Neutro Abs 10.9 (*) 1.7 - 7.7 K/uL   Lymphocytes Relative 7 (*) 12 - 46 %   Lymphs Abs 0.9  0.7 - 4.0 K/uL   Monocytes Relative 7  3 - 12 %   Monocytes Absolute 0.9  0.1 - 1.0 K/uL   Eosinophils Relative 1  0 - 5 %   Eosinophils Absolute 0.1  0.0 - 0.7 K/uL   Basophils Relative 0  0 - 1 %   Basophils Absolute 0.0  0.0 - 0.1 K/uL  PROTIME-INR     Status: Abnormal   Collection Time    09/21/12  2:33 AM      Result Value Range   Prothrombin Time 30.5 (*) 11.6 - 15.2 seconds   INR 3.06 (*) 0.00 - 1.49  TYPE AND SCREEN     Status: None   Collection Time    09/21/12  2:33 AM      Result Value Range   ABO/RH(Holly) A POS     Antibody Screen NEG     Sample Expiration 09/24/2012      Dg Chest 1 View  09/21/2012   *RADIOLOGY REPORT*  Clinical  Data: Fall with hip pain.  Preoperative chest x-ray.  CHEST - 1 VIEW  Comparison: 07/06/2012.  Findings: Chronic cardiomegaly.  Unchanged upper mediastinal contours.  Hiatal hernia, at least moderate in size.  Aortic atherosclerosis.  Hyperinflated lungs.  No acute infiltrate, edema, effusion, pneumothorax.  Osteopenia.  S-shaped scoliosis.  Two previous lower thoracic vertebral body compression fractures with bone augmentation.  IMPRESSION: 1. No evidence of acute cardiopulmonary disease. 2.  Chronic cardiomegaly. 3.  Probable COPD. 4.  Moderate hiatal hernia.   Original Report Authenticated By: Jorje Guild   Dg Hip Complete Left  09/21/2012   *RADIOLOGY REPORT*  Clinical Data: Fall with hip pain.  LEFT HIP - COMPLETE 2+ VIEW  Comparison: None.  Findings: Intertrochanteric left femur fracture with varus angulation.  The femoral head remains located.  As permitted by overlapping bowel gas, no acute pelvic ring fracture identified.  Severe osteopenia.  Lower lumbar degenerative disc disease.  IMPRESSION: Acute intertrochanteric left femur fracture with varus angulation.   Original Report Authenticated By: Jorje Guild    Review of Systems  All other systems reviewed and are negative.   Blood pressure 157/119, pulse 55, temperature 97.6 F (36.4 C), temperature source Oral, resp. rate 18, SpO2 99.00%. Physical Exam On examination patient's left lower extremity is shortened and externally rotated. She has a good dorsalis pedis pulse. Review of the radiographs shows an intertrochanteric left hip fracture. Assessment/Plan: Intertrochanteric left hip fracture.  Plan: Discussed with the patient and her daughter recommendation for internal fixation with an IM nail. Risks and benefits were discussed including infection neurovascular injury pain DVT need for additional surgery. Patient and her daughter state understand was pursued this time. Patient admission INR was 3.0 she has received vitamin K and  we'll recheck her  INR level this afternoon. Anticipate surgery this afternoon.  HollyMARCUS Weaver 09/21/2012, 7:16 AM

## 2012-09-21 NOTE — Transfer of Care (Signed)
Immediate Anesthesia Transfer of Care Note  Patient: Holly Weaver  Procedure(s) Performed: Procedure(s) (LRB): INTRAMEDULLARY (IM) NAIL FEMORAL (Left)  Patient Location: PACU  Anesthesia Type: General  Level of Consciousness: sedated, patient cooperative and responds to stimulaton  Airway & Oxygen Therapy: Patient Spontanous Breathing and Patient connected to face mask oxgen  Post-op Assessment: Report given to PACU RN and Post -op Vital signs reviewed and stable  Post vital signs: Reviewed and stable  Complications: No apparent anesthesia complications

## 2012-09-21 NOTE — Progress Notes (Addendum)
ANTICOAGULATION CONSULT NOTE - Initial Consult  Pharmacy Consult for Warfarin Indication: atrial fibrillation  Allergies  Allergen Reactions  . Codeine Nausea And Vomiting   Patient Measurements: Height: 5\' 5"  (165.1 cm) Weight: 114 lb 9.6 oz (51.982 kg) IBW/kg (Calculated) : 57  Vital Signs: Temp: 98.1 F (36.7 C) (08/31 1700) Temp src: Oral (08/31 1700) BP: 136/46 mmHg (08/31 1700) Pulse Rate: 63 (08/31 1700)  Labs:  Recent Labs  09/21/12 0233 09/21/12 1010  HGB 11.6*  --   HCT 34.1*  --   PLT 177  --   LABPROT 30.5* 22.2*  INR 3.06* 2.02*  CREATININE 0.94  --    Estimated Creatinine Clearance: 34.6 ml/min (by C-G formula based on Cr of 0.94).  Medical History: Past Medical History  Diagnosis Date  . Breast cancer   . Hypertension   . Osteoporosis   . Scoliosis   . Atrial fibrillation   . Compression fracture 09/21/2012  . Cervical spondylolysis 09/21/2012  . Osteoarthritis 09/21/2012  . Dyslipidemia 09/21/2012   Medications:  Scheduled:  . amiodarone  200 mg Oral q morning - 10a  . antiseptic oral rinse  15 mL Mouth Rinse BID  . [START ON 09/22/2012] aspirin EC  81 mg Oral q morning - 10a  . atorvastatin  40 mg Oral QPM  .  ceFAZolin (ANCEF) IV  1 g Intravenous Q6H  . ferrous sulfate  325 mg Oral TID PC  . metoprolol  75 mg Oral BID  . pantoprazole  40 mg Oral Daily  . polyethylene glycol  17 g Oral q morning - 10a   Assessment: 89 yoF on chronic Warfarin for Afib s/p Hip Fx repair. Admit INR was 3.06, received Vit K IV 10mg  x1, 5mg  po x1.  Warfarin dose 2.5mg  daily except 3.75mg  on Monday with the last dose 8/30  Resume Warfarin, dose per Pharmacy  Goal of Therapy:  INR 2-3   Plan:   Warfarin 4mg  today at 1800  Daily PT/INR  Minda Ditto PharmD Pager [phone] 09/21/2012, 5:29 PM

## 2012-09-21 NOTE — H&P (Signed)
[facility] History and Physical  Patient: Holly Weaver  [id number]  DOB: [birth year]  DOA: 09/21/2012  Referring physician: Dr Sharol Given PCP: Janalyn Rouse, MD  Specialists: Dr. Sharol Given  Chief Complaint: Fall  HPI: Holly Weaver is a 79 y.o. female with Past medical history of osteoporosis, osteoarthritis, compression fracture of lumbar spine, cervical spondylosis with chronic malalignment of the cervical spine, atrial fibrillation on Coumadin, hypertension. She presented today with the complaint of fall. She was sleeping at the edge of the bed, and she rolled over on the ground. When she tried to stand she couldn't because of the pain in the left hip and had another fall. After that she asked for help and was brought to the ER. She denied any complaint of chest pain, dizziness, headache, neck pain, focal neurological deficit, nausea, vomiting, abdominal pain, urinary symptoms, diarrhea, active bleeding. She mentions she was at her baseline when she went to sleep. She mentions there is no change in her medication. She denies any tongue bite or incontinence of bowel or bladder.  Review of Systems: as mentioned in the history of present illness.  A Comprehensive review of the other systems is negative.  Past Medical History  Diagnosis Date  . Breast cancer   . Hypertension   . Osteoporosis   . Scoliosis   . Atrial fibrillation   . Compression fracture 09/21/2012  . Cervical spondylolysis 09/21/2012  . Osteoarthritis 09/21/2012  . Dyslipidemia 09/21/2012   Past Surgical History  Procedure Laterality Date  . Hernia repair     Social History:  reports that she has never smoked. She does not have any smokeless tobacco history on file. She reports that she does not drink alcohol or use illicit drugs. Patient is coming from home. Independent for most of her  ADL.  Allergies  Allergen Reactions  . Codeine Nausea And Vomiting    Family History  Problem Relation Age of  Onset  . Cancer Mother   . CAD Father   . Cancer Other     Prior to Admission medications   Medication Sig Start Date End Date Taking? Authorizing Provider  acetaminophen (TYLENOL) 325 MG tablet Take 325 mg by mouth every 4 (four) hours as needed for pain.   Yes Historical Provider, MD  alendronate (FOSAMAX) 70 MG tablet Take 70 mg by mouth every 7 (seven) days. Take with a full glass of water on an empty stomach.   Yes Historical Provider, MD  ALPRAZolam Duanne Moron) 0.5 MG tablet Take 0.5 mg by mouth every 8 (eight) hours as needed for anxiety.    Yes Historical Provider, MD  amiodarone (PACERONE) 200 MG tablet Take 200 mg by mouth every morning.  12/06/11  Yes Kristin Alvstad, RPH-CPP  amLODipine (NORVASC) 5 MG tablet Take 5 mg by mouth every morning.    Yes Historical Provider, MD  aspirin EC 81 MG tablet Take 81 mg by mouth every morning.    Yes Historical Provider, MD  atorvastatin (LIPITOR) 40 MG tablet Take 40 mg by mouth every evening.    Yes Historical Provider, MD  calcium-vitamin D (OSCAL WITH D) 500-200 MG-UNIT per tablet Take 1 tablet by mouth every morning.    Yes Historical Provider, MD  letrozole (FEMARA) 2.5 MG tablet Take 2.5 mg by mouth every morning.   Yes Historical Provider, MD  metoprolol (LOPRESSOR) 100 MG tablet Take 100 mg by mouth 2 (two) times daily.   Yes Historical Provider, MD  Multiple Vitamin (MULTIVITAMIN WITH MINERALS) TABS  Take 1 tablet by mouth every morning. Centrum 50+   Yes Historical Provider, MD  olmesartan (BENICAR) 40 MG tablet Take 40 mg by mouth daily.   Yes Historical Provider, MD  omeprazole (PRILOSEC) 20 MG capsule Take 20 mg by mouth daily as needed (for acid reduction).    Yes Historical Provider, MD  polyethylene glycol (MIRALAX / GLYCOLAX) packet Take 17 g by mouth every morning.    Yes Historical Provider, MD  traMADol (ULTRAM) 50 MG tablet Take 50 mg by mouth every 8 (eight) hours as needed for pain.    Yes Historical Provider, MD   triamterene-hydrochlorothiazide (DYAZIDE) 37.5-25 MG per capsule Take 1 capsule by mouth every morning.   Yes Historical Provider, MD  Vitamin D, Ergocalciferol, (DRISDOL) 50000 UNITS CAPS Take 50,000 Units by mouth every 7 (seven) days.   Yes Historical Provider, MD  warfarin (COUMADIN) 2.5 MG tablet Take 2.5-3.75 mg by mouth every evening. Take 1.5 tabs on mon, and 1 tab on all other days 02/14/12  Yes Tommy Medal, RPH-CPP    Physical Exam: Filed Vitals:   09/21/12 0059 09/21/12 0100 09/21/12 0305  BP: 147/57  157/119  Pulse: 53  55  Temp: 97.6 F (36.4 C)    TempSrc: Oral    Resp: 15  18  SpO2: 91% 97% 99%    General: Alert, Awake and Oriented to Time, Place and Person. Appear in marked distress Eyes: PERRL ENT: Oral Mucosa clear moist. Neck: no JVD, no Carotid Bruits  Cardiovascular: S1 and S2 Present, systolic aortic Murmur, Peripheral Pulses Present Respiratory: Bilateral Air entry equal and Decreased, Clear to Auscultation,  no Crackles,no wheezes Abdomen: Bowel Sound Present, Soft and Non tender Skin: no Rash Extremities: Trace Pedal edema, no calf tenderness, left leg shortened, externally rotated, bruise present on the left arm and left hip.  Neurologic: Grossly Unremarkable.  Labs on Admission:  CBC:  Recent Labs Lab 09/21/12 0233  WBC 12.8*  NEUTROABS 10.9*  HGB 11.6*  HCT 34.1*  MCV 100.3*  PLT 177    CMP     Component Value Date/Time   NA 134* 09/21/2012 0233   K 3.9 09/21/2012 0233   CL 98 09/21/2012 0233   CO2 29 09/21/2012 0233   GLUCOSE 131* 09/21/2012 0233   BUN 14 09/21/2012 0233   CREATININE 0.94 09/21/2012 0233   CALCIUM 9.0 09/21/2012 0233   PROT 6.9 09/13/2011 1252   ALBUMIN 4.5 09/13/2011 1252   AST 35 09/13/2011 1252   ALT 31 09/13/2011 1252   ALKPHOS 48 09/13/2011 1252   BILITOT 1.1 09/13/2011 1252   GFRNONAA 53* 09/21/2012 0233   GFRAA 61* 09/21/2012 0233    No results found for this basename: LIPASE, AMYLASE,  in the last 168 hours No  results found for this basename: AMMONIA,  in the last 168 hours  Cardiac Enzymes: No results found for this basename: CKTOTAL, CKMB, CKMBINDEX, TROPONINI,  in the last 168 hours  BNP (last 3 results) No results found for this basename: PROBNP,  in the last 8760 hours  Radiological Exams on Admission: Dg Chest 1 View  09/21/2012   *RADIOLOGY REPORT*  Clinical Data: Fall with hip pain.  Preoperative chest x-ray.  CHEST - 1 VIEW  Comparison: 07/06/2012.  Findings: Chronic cardiomegaly.  Unchanged upper mediastinal contours.  Hiatal hernia, at least moderate in size.  Aortic atherosclerosis.  Hyperinflated lungs.  No acute infiltrate, edema, effusion, pneumothorax.  Osteopenia.  S-shaped scoliosis.  Two previous lower thoracic vertebral body  compression fractures with bone augmentation.  IMPRESSION: 1. No evidence of acute cardiopulmonary disease. 2.  Chronic cardiomegaly. 3.  Probable COPD. 4.  Moderate hiatal hernia.   Original Report Authenticated By: Jorje Guild   Dg Hip Complete Left  09/21/2012   *RADIOLOGY REPORT*  Clinical Data: Fall with hip pain.  LEFT HIP - COMPLETE 2+ VIEW  Comparison: None.  Findings: Intertrochanteric left femur fracture with varus angulation.  The femoral head remains located.  As permitted by overlapping bowel gas, no acute pelvic ring fracture identified.  Severe osteopenia.  Lower lumbar degenerative disc disease.  IMPRESSION: Acute intertrochanteric left femur fracture with varus angulation.   Original Report Authenticated By: Jorje Guild    EKG: Independently reviewed. nonspecific ST and T waves changes.  Assessment/Plan Principal Problem:   Closed left hip fracture Active Problems:   Atrial fibrillation   Long term (current) use of anticoagulants   Osteoarthritis   Cervical spondylolysis   Compression fracture   Osteoporosis   Dyslipidemia   Hypertension   1. Closed left hip fracture The patient has intertrochanteric fracture of the tip of the  left leg. She has history of osteoporosis and is on Fosamax.  Currently orthopedics has been consulted and will be evaluating the patient for possible surgery. At present I will keep the patient n.p.o.. IV hydration, and IV pain management with morphine.  2. Atrial fibrillation on Coumadin INR is 3. She does not appear to have any head trauma or neck trauma. She denies any focal neurological deficit. She has chronic back pain which does not appear to be new for her.  At present considering the potential surgery evidently was the INR with vitamin K, as the indication is only atrial fibrillation. I would hold off on using FFP as she is not actively bleeding, and timing of the surgery is not decided to. Continue monitoring on telemetry, she appears in sinus rhythm.  3. Hypertension Accelerated do to pain. Continue home antihypertensive, other than holding Benicar and diuretics  DVT Prophylaxis: mechanical compression device Nutrition: N.p.o.  Code Status: Full  Family Communication: Daughter was at bedside, all questions were answered.   Author: Berle Mull, MD [facility] Pager: [phone] 09/21/2012, 6:18 AM    If 7PM-7AM, please contact night-coverage [email or web address] Password [email or web address]

## 2012-09-21 NOTE — ED Notes (Signed)
Patient states that she was in bed asleep and rolled out of bed on the floor.  She attempted to get up and walk and fell again.  She has notable deformity of the left hip with shortening and outward rotation of the left leg.  Good sensation, warmth and pulses to the left leg.  Currently she rates her pain 10 of 10 with movement

## 2012-09-21 NOTE — Progress Notes (Signed)
Pt has small amount bright red bleeding to Left hip dressing.  Coumadin 4mg  ordered.  Called MD to verify to give dose.  Instructed by Dr. Erlinda Hong to hold dose tonight and resume tomorrow. Andre Lefort

## 2012-09-21 NOTE — Anesthesia Preprocedure Evaluation (Addendum)
Anesthesia Evaluation  Patient identified by MRN, date of birth, ID band Patient awake    Reviewed: Allergy & Precautions, H&P , NPO status , Patient's Chart, lab work & pertinent test results  Airway Mallampati: II TM Distance: >3 FB Neck ROM: Limited    Dental no notable dental hx.    Pulmonary neg pulmonary ROS,  breath sounds clear to auscultation  Pulmonary exam normal       Cardiovascular hypertension, Pt. on medications + dysrhythmias Atrial Fibrillation Rhythm:Irregular Rate:Normal  Left ventricle:  The cavity size was moderately reduced. Wall thickness was increased in a pattern of mild LVH. There was mild focal basal and mild concentric hypertrophy of the septum. Systolic function was normal. The estimated ejection fraction was in the range of 50% to 55%. Regional wall motion abnormalities cannot be excluded   Neuro/Psych negative neurological ROS  negative psych ROS   GI/Hepatic negative GI ROS, Neg liver ROS,   Endo/Other  negative endocrine ROS  Renal/GU negative Renal ROS  negative genitourinary   Musculoskeletal negative musculoskeletal ROS (+)   Abdominal   Peds negative pediatric ROS (+)  Hematology negative hematology ROS (+)   Anesthesia Other Findings   Reproductive/Obstetrics negative OB ROS                           Anesthesia Physical Anesthesia Plan  ASA: III and emergent  Anesthesia Plan: General   Post-op Pain Management:    Induction: Intravenous  Airway Management Planned: Oral ETT  Additional Equipment:   Intra-op Plan:   Post-operative Plan: Extubation in OR  Informed Consent: I have reviewed the patients History and Physical, chart, labs and discussed the procedure including the risks, benefits and alternatives for the proposed anesthesia with the patient or authorized representative who has indicated his/her understanding and acceptance.    Dental advisory given  Plan Discussed with: CRNA and Surgeon  Anesthesia Plan Comments: (Patient anti-coagulated. Transfuse 2 units FFP stat, will bring for surgery post transfusion)       Anesthesia Quick Evaluation

## 2012-09-22 ENCOUNTER — Inpatient Hospital Stay (HOSPITAL_COMMUNITY): Payer: Medicare Other

## 2012-09-22 DIAGNOSIS — I10 Essential (primary) hypertension: Secondary | ICD-10-CM

## 2012-09-22 DIAGNOSIS — I4891 Unspecified atrial fibrillation: Secondary | ICD-10-CM

## 2012-09-22 DIAGNOSIS — D62 Acute posthemorrhagic anemia: Secondary | ICD-10-CM | POA: Diagnosis not present

## 2012-09-22 DIAGNOSIS — S72009A Fracture of unspecified part of neck of unspecified femur, initial encounter for closed fracture: Secondary | ICD-10-CM

## 2012-09-22 LAB — URINALYSIS, ROUTINE W REFLEX MICROSCOPIC
Bilirubin Urine: NEGATIVE
Glucose, UA: 500 mg/dL — AB
Specific Gravity, Urine: 1.021 (ref 1.005–1.030)
Urobilinogen, UA: 0.2 mg/dL (ref 0.0–1.0)
pH: 6 (ref 5.0–8.0)

## 2012-09-22 LAB — CBC WITH DIFFERENTIAL/PLATELET
Basophils Absolute: 0 10*3/uL (ref 0.0–0.1)
Basophils Relative: 0 % (ref 0–1)
Basophils Relative: 0 % (ref 0–1)
Eosinophils Relative: 0 % (ref 0–5)
HCT: 17 % — ABNORMAL LOW (ref 36.0–46.0)
HCT: 18.2 % — ABNORMAL LOW (ref 36.0–46.0)
Hemoglobin: 5.9 g/dL — CL (ref 12.0–15.0)
Hemoglobin: 6.3 g/dL — CL (ref 12.0–15.0)
Lymphocytes Relative: 6 % — ABNORMAL LOW (ref 12–46)
Lymphs Abs: 0.6 10*3/uL — ABNORMAL LOW (ref 0.7–4.0)
MCHC: 34.7 g/dL (ref 30.0–36.0)
MCHC: 34.6 g/dL (ref 30.0–36.0)
MCV: 98.8 fL (ref 78.0–100.0)
Monocytes Absolute: 0.8 10*3/uL (ref 0.1–1.0)
Monocytes Absolute: 0.9 10*3/uL (ref 0.1–1.0)
Monocytes Relative: 7 % (ref 3–12)
Monocytes Relative: 7 % (ref 3–12)
Neutro Abs: 11.4 10*3/uL — ABNORMAL HIGH (ref 1.7–7.7)
Neutrophils Relative %: 89 % — ABNORMAL HIGH (ref 43–77)
RBC: 1.82 MIL/uL — ABNORMAL LOW (ref 3.87–5.11)
RDW: 14.3 % (ref 11.5–15.5)

## 2012-09-22 LAB — CBC
MCH: 33.7 pg (ref 26.0–34.0)
MCHC: 35.7 g/dL (ref 30.0–36.0)
MCV: 94.3 fL (ref 78.0–100.0)
Platelets: 126 10*3/uL — ABNORMAL LOW (ref 150–400)
RBC: 2.97 MIL/uL — ABNORMAL LOW (ref 3.87–5.11)
RDW: 16 % — ABNORMAL HIGH (ref 11.5–15.5)

## 2012-09-22 LAB — COMPREHENSIVE METABOLIC PANEL
ALT: 26 U/L (ref 0–35)
Albumin: 2.9 g/dL — ABNORMAL LOW (ref 3.5–5.2)
Alkaline Phosphatase: 45 U/L (ref 39–117)
BUN: 15 mg/dL (ref 6–23)
CO2: 26 mEq/L (ref 19–32)
Chloride: 93 mEq/L — ABNORMAL LOW (ref 96–112)
Glucose, Bld: 187 mg/dL — ABNORMAL HIGH (ref 70–99)
Total Protein: 5.2 g/dL — ABNORMAL LOW (ref 6.0–8.3)

## 2012-09-22 LAB — PREPARE FRESH FROZEN PLASMA: Unit division: 0

## 2012-09-22 LAB — URINE MICROSCOPIC-ADD ON

## 2012-09-22 LAB — OSMOLALITY, URINE: Osmolality, Ur: 482 mOsm/kg (ref 390–1090)

## 2012-09-22 MED ORDER — FUROSEMIDE 10 MG/ML IJ SOLN
20.0000 mg | Freq: Once | INTRAMUSCULAR | Status: AC
  Administered 2012-09-22: 14:00:00 20 mg via INTRAVENOUS
  Filled 2012-09-22: qty 2

## 2012-09-22 MED ORDER — ADULT MULTIVITAMIN W/MINERALS CH
1.0000 | ORAL_TABLET | Freq: Every day | ORAL | Status: DC
  Administered 2012-09-22 – 2012-09-24 (×3): 1 via ORAL
  Filled 2012-09-22 (×3): qty 1

## 2012-09-22 MED ORDER — SODIUM CHLORIDE 0.9 % IV BOLUS (SEPSIS)
500.0000 mL | Freq: Once | INTRAVENOUS | Status: AC
  Administered 2012-09-22: 500 mL via INTRAVENOUS

## 2012-09-22 MED ORDER — SODIUM CHLORIDE 0.9 % IV BOLUS (SEPSIS): 1000.0000 mL | Freq: Once | INTRAVENOUS | Status: DC

## 2012-09-22 MED ORDER — DIPHENHYDRAMINE HCL 25 MG PO CAPS
25.0000 mg | ORAL_CAPSULE | Freq: Once | ORAL | Status: AC
  Administered 2012-09-22: 11:00:00 25 mg via ORAL
  Filled 2012-09-22: qty 1

## 2012-09-22 MED ORDER — ENSURE COMPLETE PO LIQD
237.0000 mL | Freq: Two times a day (BID) | ORAL | Status: DC
  Administered 2012-09-22 – 2012-09-24 (×4): 237 mL via ORAL

## 2012-09-22 MED ORDER — WARFARIN SODIUM 4 MG PO TABS
4.0000 mg | ORAL_TABLET | Freq: Once | ORAL | Status: DC
  Filled 2012-09-22: qty 1

## 2012-09-22 MED ORDER — SODIUM CHLORIDE 0.9 % IV SOLN
INTRAVENOUS | Status: DC
  Administered 2012-09-22 – 2012-09-23 (×2): via INTRAVENOUS

## 2012-09-22 MED ORDER — ACETAMINOPHEN 325 MG PO TABS
650.0000 mg | ORAL_TABLET | Freq: Once | ORAL | Status: AC
  Administered 2012-09-22: 11:00:00 650 mg via ORAL
  Filled 2012-09-22: qty 2

## 2012-09-22 NOTE — Progress Notes (Signed)
CRITICAL VALUE ALERT  Critical value received:  Hemoglobin 5.9  Date of notification:  09/22/2012  Time of notification:  0834  Critical value read back:yes  Nurse who received alert:  Dorian Pod   MD notified (1st page):  Dr. Grandville Silos  Time of first page:  [phone]  MD notified (2nd page):  Time of second page:  Responding MD:  Dr. Grandville Silos  Time MD responded:  [phone]

## 2012-09-22 NOTE — Progress Notes (Signed)
INITIAL NUTRITION ASSESSMENT  DOCUMENTATION CODES Per approved criteria  -Not Applicable   INTERVENTION: Provide Ensure Complete BID Provide Gatorade once daily Provide Multivitamin with minerals daily  NUTRITION DIAGNOSIS: Inadequate oral intake related to poor appetite as evidenced by pt's report and 5% unintended wt loss.  Goal: Pt to meet >/= 90% of their estimated nutrition needs   Monitor:  Po intake Weight Labs  Reason for Assessment: Malnutrition Screening Tool, score of 2  79 y.o. female  Admitting Dx: Closed left hip fracture  ASSESSMENT: 79 y.o. female with past medical history of osteoporosis, osteoarthritis, compression fracture of lumbar spine, cervical spondylosis with chronic malalignment of the cervical spine, atrial fibrillation on Coumadin, hypertension. She presented today with the complaint of fall.  Pt reports that she has had a poor appetite since starting Coumadin over 1 year ago. She states she eats 3 meals but, eats very little. She also complains that food no longer tastes good. Pt states that she used to weigh 120 lbs and recently weighed 109 lbs. She states she drinks Boost/Ensure once per day some days. Encouraged pt to eat >50% of 3 meals daily and to drink Ensure twice daily to help pt increase nutrient and protein intake. Encouraged pt to incorporate protein-rich foods at each meal; reviewed protein-rich foods.  Height: Ht Readings from Last 1 Encounters:  09/21/12 5\' 5"  (1.651 m)    Weight: Wt Readings from Last 1 Encounters:  09/21/12 114 lb 9.6 oz (51.982 kg)    Ideal Body Weight: 125 lbs  % Ideal Body Weight: 91%  Wt Readings from Last 10 Encounters:  09/21/12 114 lb 9.6 oz (51.982 kg)  09/21/12 114 lb 9.6 oz (51.982 kg)  03/18/12 116 lb 1.6 oz (52.663 kg)  09/13/11 120 lb 8 oz (54.658 kg)  03/15/11 119 lb 11.2 oz (54.296 kg)    Usual Body Weight: 120 lbs  % Usual Body Weight: 95%  BMI:  Body mass index is 19.07  kg/(m^2).  Estimated Nutritional Needs: Kcal: 1300-1460 Protein: 55-65 grams Fluid: >/=1/5 L  Skin: +2 RLE and LLE edema; incision on left hip. Skin tear on left arm  Diet Order: General  EDUCATION NEEDS: -No education needs identified at this time   Intake/Output Summary (Last 24 hours) at 09/22/12 1330 Last data filed at 09/22/12 1120  Gross per 24 hour  Intake 2513.34 ml  Output   1580 ml  Net 933.34 ml    Last BM: PTA  Labs:   Recent Labs Lab 09/21/12 0233 09/22/12 0415  NA 134* 128*  K 3.9 4.0  CL 98 93*  CO2 29 26  BUN 14 15  CREATININE 0.94 0.80  CALCIUM 9.0 8.2*  GLUCOSE 131* 187*    CBG (last 3)  No results found for this basename: GLUCAP,  in the last 72 hours  Scheduled Meds: . amiodarone  200 mg Oral q morning - 10a  . antiseptic oral rinse  15 mL Mouth Rinse BID  . aspirin EC  81 mg Oral q morning - 10a  . atorvastatin  40 mg Oral QPM  . ferrous sulfate  325 mg Oral TID PC  . furosemide  20 mg Intravenous Once  . metoprolol  75 mg Oral BID  . pantoprazole  40 mg Oral Daily  . polyethylene glycol  17 g Oral q morning - 10a  . Warfarin - Pharmacist Dosing Inpatient   Does not apply q1800    Continuous Infusions: . sodium chloride 100 mL/hr at  09/22/12 [id number]    Past Medical History  Diagnosis Date  . Breast cancer   . Hypertension   . Osteoporosis   . Scoliosis   . Atrial fibrillation   . Compression fracture 09/21/2012  . Cervical spondylolysis 09/21/2012  . Osteoarthritis 09/21/2012  . Dyslipidemia 09/21/2012    Past Surgical History  Procedure Laterality Date  . Hernia repair      Pryor Ochoa RD, LDN Inpatient Clinical Dietitian Pager: [phone] After Hours Pager: [phone]

## 2012-09-22 NOTE — Progress Notes (Signed)
CRITICAL VALUE ALERT  Critical value received:  Hemoglobin 6.3  Date of notification:  09/22/2012  Time of notification:  0908  Critical value read back:yes  Nurse who received alert:  Faith Rogue, RN   MD notified (1st page): Dr. Grandville Silos   Time of first page:  0909  MD notified (2nd page):  Time of second page:  Responding MD:  Dr. Grandville Silos   Time MD responded:  [phone]

## 2012-09-22 NOTE — Progress Notes (Signed)
Patient ID: Holly Weaver, female   DOB: [birth year], 79 y.o.   MRN: [id number] Patient with acute blood loss  anemia most likely do to her elevated INR. Patient is comfortable from her hip standpoint. Okay for discharge to skilled nursing facility. I will followup in the office in 2 weeks.

## 2012-09-22 NOTE — Progress Notes (Addendum)
ANTICOAGULATION CONSULT NOTE - Follow-up Consult  Pharmacy Consult for Warfarin Indication: atrial fibrillation  Allergies  Allergen Reactions  . Codeine Nausea And Vomiting   Patient Measurements: Height: 5\' 5"  (165.1 cm) Weight: 114 lb 9.6 oz (51.982 kg) IBW/kg (Calculated) : 57  Vital Signs: Temp: 98 F (36.7 C) (09/01 0531) Temp src: Oral (09/01 0531) BP: 109/54 mmHg (09/01 0531) Pulse Rate: 60 (09/01 0531)  Labs:  Recent Labs  09/21/12 0233 09/21/12 1010 09/22/12 0415  HGB 11.6*  --   --   HCT 34.1*  --   --   PLT 177  --   --   LABPROT 30.5* 22.2* 14.9  INR 3.06* 2.02* 1.20  CREATININE 0.94  --  0.80   Estimated Creatinine Clearance: 40.7 ml/min (by C-G formula based on Cr of 0.8).  Medical History: Past Medical History  Diagnosis Date  . Breast cancer   . Hypertension   . Osteoporosis   . Scoliosis   . Atrial fibrillation   . Compression fracture 09/21/2012  . Cervical spondylolysis 09/21/2012  . Osteoarthritis 09/21/2012  . Dyslipidemia 09/21/2012   Medications:  Scheduled:  . amiodarone  200 mg Oral q morning - 10a  . antiseptic oral rinse  15 mL Mouth Rinse BID  . aspirin EC  81 mg Oral q morning - 10a  . atorvastatin  40 mg Oral QPM  . ferrous sulfate  325 mg Oral TID PC  . metoprolol  75 mg Oral BID  . pantoprazole  40 mg Oral Daily  . polyethylene glycol  17 g Oral q morning - 10a  . warfarin  4 mg Oral ONCE-1800  . Warfarin - Pharmacist Dosing Inpatient   Does not apply q1800   Assessment: 69 yoF on chronic warfarin for Afib s/p Hip Fx repair 8/31. Admit INR was 3.06, received Vit K IV 10mg  x1, 5mg  po x1, 2 units FFP prior to surgery.  Warfarin per pharmacy resumed post-op.    Home regimen: Warfarin 2.5mg  daily except 3.75mg  on Monday with the last dose 8/30  Warfarin 4mg  ordered 8/31 however dose not given per MD orders due to small amount of bright red bleeding to left hip dressing.  Warfarin ok to be resumed 9/1.  INR = 1.20  -subtherapeutic as expected after 2 doses vitamin K  CBC from 8/31: Hgb 11.6, plts 177  Pt also on amiodarone and asa 81mg .  Regular diet ordered.  SCDs documented "on."    Goal of Therapy:  INR 2-3   Plan:   Warfarin 4mg  today at 1800  Daily PT/INR  Ralene Bathe, PharmD 09/22/2012, 7:24 AM  Pager: [phone]  Addendum:  RN called pharmacy concerned about left arm hematoma presumably from when patient fell.  CBC from this morning also resulted as 5.9, then 6.3.  Spoke with Dr. Grandville Silos regarding warfarin and if dose should be held tonight.  MD not able to see patient immediately, but will evaluate this afternoon.   Plan: 1.  D/c'd order for tonight.  Currently no order in [facility].  2.  Await Dr. Biagio Borg decision regarding anticoagulation therapy tonight.    Ralene Bathe, PharmD 09/22/2012, 1:21 PM  Pager: [phone]

## 2012-09-22 NOTE — Progress Notes (Signed)
Clinical Social Work Department BRIEF PSYCHOSOCIAL ASSESSMENT 09/22/2012  Patient:  Holly Weaver, Holly Weaver     Account Number:  [id number]     Admit date:  09/21/2012  Clinical Social Worker:  Renold Genta  Date/Time:  09/22/2012 03:14 PM  Referred by:  Physician  Date Referred:  09/22/2012 Referred for  SNF Placement   Other Referral:   Interview type:  Patient Other interview type:    PSYCHOSOCIAL DATA Living Status:  ALONE Admitted from facility:   Level of care:   Primary support name:  Carlyle Lipa (daughter) h#: [phone] c#: [phone] Primary support relationship to patient:  CHILD, ADULT Degree of support available:   good    CURRENT CONCERNS Current Concerns  Post-Acute Placement   Other Concerns:    SOCIAL WORK ASSESSMENT / PLAN CSW received consult for SNF placement, note PT recommended SNF for patient @ discharge.   Assessment/plan status:  Information/Referral to [employer] Other assessment/ plan:   Information/referral to community resources:   CSW completed FL2 and faxed information out to [facility] - provided list of facilities, confirmed with Florentina Jenny @ [facility] that they would be able to offer a bed - per patient/daughter's request.    PATIENT'S/FAMILY'S RESPONSE TO PLAN OF CARE: Patient is agreeable with plan for SNF at discharge, she has not been to a SNF in the past. Anticipating discharge Wednesday.       Winfred Leeds, [facility] Clinical Social Worker cell #: [phone]

## 2012-09-22 NOTE — Progress Notes (Signed)
Clinical Social Work Department CLINICAL SOCIAL WORK PLACEMENT NOTE 09/22/2012  Patient:  MARTI, MCCALLIE  Account Number:  [id number] [place] date:  09/21/2012  Clinical Social Worker:  Renold Genta  Date/time:  09/22/2012 03:19 PM  Clinical Social Work is seeking post-discharge placement for this patient at the following level of care:   SKILLED NURSING   (*CSW will update this form in Epic as items are completed)   09/22/2012  Patient/family provided with [place] Department of Clinical Social Work's list of facilities offering this level of care within the geographic area requested by the patient (or if unable, by the patient's family).  09/22/2012  Patient/family informed of their freedom to choose among providers that offer the needed level of care, that participate in Medicare, Medicaid or managed care program needed by the patient, have an available bed and are willing to accept the patient.  09/22/2012  Patient/family informed of [facility]' ownership interest in [facility], as well as of the fact that they are under no obligation to receive care at this facility.  PASARR submitted to EDS on 09/22/2012 PASARR number received from EDS on 09/22/2012  FL2 transmitted to all facilities in geographic area requested by pt/family on  09/22/2012 FL2 transmitted to all facilities within larger geographic area on   Patient informed that his/her managed care company has contracts with or will negotiate with  certain facilities, including the following:     Patient/family informed of bed offers received:  09/22/2012 Patient chooses bed at [facility] Physician recommends and patient chooses bed at    Patient to be transferred to [facility] on   Patient to be transferred to facility by   The following physician request were entered in Epic:   Additional Comments: Winfred Leeds, [place] Worker cell #:  [phone]

## 2012-09-22 NOTE — Progress Notes (Signed)
[facility] PROGRESS NOTE  Holly Weaver [id number] DOB: 02-05-1923 DOA: 09/21/2012 PCP: Janalyn Rouse, MD  Assessment/Plan: #1 left hip fracture intertrochanteric Secondary to mechanical fall. Patient is status post IM nail per Dr. Sharol Given 09/21/2012. PT/OT. Per orthopedics.  #2 acute blood loss anemia Likely secondary to surgery an elevated INR. No overt GI bleed. Hemoglobin today is 6.3 from 11.6 on admission. Will transfuse 2 units packed red blood cells. Follow H&H. Monitor.  #3 atrial fibrillation On admission patient's INR was supratherapeutic. Per daughter this was patient's second fall. Patient's INR was reversed with FFP and vitamin K. Continue amiodarone and Lopressor for rate control. Will resume Coumadin in the morning. Patient will likely need to followup with cardiologist as outpatient to determine whether patient needs to continue to be on Coumadin long-term.  #4 hypertension Stable. Lopressor.  #5 prophylaxis SCDs for DVT prophylaxis. PPI for GI prophylaxis.  Code Status: Full Family Communication: Updated patient and daughter at bedside. Disposition Plan: SNF when medically stable   Consultants:  Orthopedics: Dr. Sharol Given 09/21/2012  Procedures:  Chest x-ray 09/22/2002 2, 09/22/2012  X-ray of the left hip 09/21/2012  IM nail and femoral 09/21/2012 per Dr. Sharol Given  2 units packed red blood cells 09/22/2012  2 units FFP 09/21/2012  Antibiotics:  None  HPI/Subjective: Patient with no complaints. Patient denies any overt bleeding.  Objective: Filed Vitals:   09/22/12 1320  BP: 146/56  Pulse: 67  Temp: 97.8 F (36.6 C)  Resp: 18    Intake/Output Summary (Last 24 hours) at 09/22/12 1359 Last data filed at 09/22/12 1120  Gross per 24 hour  Intake 2513.34 ml  Output   1580 ml  Net 933.34 ml   Filed Weights   09/21/12 0803  Weight: 51.982 kg (114 lb 9.6 oz)    Exam:   General:  NAD  Cardiovascular: RRR  Respiratory:  CTAB  Abdomen: sOFT/nt/nd/+bs  Musculoskeletal: No c/c/e  Data Reviewed: Basic Metabolic Panel:  Recent Labs Lab 09/21/12 0233 09/22/12 0415  NA 134* 128*  K 3.9 4.0  CL 98 93*  CO2 29 26  GLUCOSE 131* 187*  BUN 14 15  CREATININE 0.94 0.80  CALCIUM 9.0 8.2*   Liver Function Tests:  Recent Labs Lab 09/22/12 0415  AST 35  ALT 26  ALKPHOS 45  BILITOT 0.7  PROT 5.2*  ALBUMIN 2.9*   No results found for this basename: LIPASE, AMYLASE,  in the last 168 hours No results found for this basename: AMMONIA,  in the last 168 hours CBC:  Recent Labs Lab 09/21/12 0233 09/22/12 0805 09/22/12 0900  WBC 12.8* 11.1* 12.9*  NEUTROABS 10.9* 9.8* 11.4*  HGB 11.6* 5.9* 6.3*  HCT 34.1* 17.0* 18.2*  MCV 100.3* 98.8 100.0  PLT 177 128* 132*   Cardiac Enzymes: No results found for this basename: CKTOTAL, CKMB, CKMBINDEX, TROPONINI,  in the last 168 hours BNP (last 3 results) No results found for this basename: PROBNP,  in the last 8760 hours CBG: No results found for this basename: GLUCAP,  in the last 168 hours  Recent Results (from the past 240 hour(s))  SURGICAL PCR SCREEN     Status: None   Collection Time    09/21/12  2:07 PM      Result Value Range Status   MRSA, PCR NEGATIVE  NEGATIVE Final   Staphylococcus aureus NEGATIVE  NEGATIVE Final   Comment:            The Xpert SA Assay (FDA  approved for NASAL specimens     in patients over 77 years of age),     is one component of     a comprehensive surveillance     program.  Test performance has     been validated by [employer] for patients greater     than or equal to 77 year old.     It is not intended     to diagnose infection nor to     guide or monitor treatment.     Studies: Dg Chest 1 View  09/21/2012   *RADIOLOGY REPORT*  Clinical Data: Fall with hip pain.  Preoperative chest x-ray.  CHEST - 1 VIEW  Comparison: 07/06/2012.  Findings: Chronic cardiomegaly.  Unchanged upper mediastinal contours.   Hiatal hernia, at least moderate in size.  Aortic atherosclerosis.  Hyperinflated lungs.  No acute infiltrate, edema, effusion, pneumothorax.  Osteopenia.  S-shaped scoliosis.  Two previous lower thoracic vertebral body compression fractures with bone augmentation.  IMPRESSION: 1. No evidence of acute cardiopulmonary disease. 2.  Chronic cardiomegaly. 3.  Probable COPD. 4.  Moderate hiatal hernia.   Original Report Authenticated By: Jorje Guild   Dg Hip Complete Left  09/21/2012   *RADIOLOGY REPORT*  Clinical Data: Fall with hip pain.  LEFT HIP - COMPLETE 2+ VIEW  Comparison: None.  Findings: Intertrochanteric left femur fracture with varus angulation.  The femoral head remains located.  As permitted by overlapping bowel gas, no acute pelvic ring fracture identified.  Severe osteopenia.  Lower lumbar degenerative disc disease.  IMPRESSION: Acute intertrochanteric left femur fracture with varus angulation.   Original Report Authenticated By: Jorje Guild   Dg Chest Port 1 View  09/22/2012   *RADIOLOGY REPORT*  Clinical Data: Leukocytosis.  PORTABLE CHEST - 1 VIEW  Comparison: Portable chest 09/21/2012 and 01/10/2010.  Findings: The patient is rotated on the study.  Small focus airspace opacity is seen in the left lung base.  The right lung is clear.  There is cardiomegaly.  Hiatal hernia is identified.  IMPRESSION:  1.  Small focus of left basilar airspace opacity has an appearance most compatible with atelectasis rather than pneumonia. 2.  Cardiomegaly without edema. 3.  Hiatal hernia.   Original Report Authenticated By: Orlean Patten, M.D.   Dg C-arm 1-60 Min-no Report  09/21/2012   CLINICAL DATA: LEFT HIP FRACTURE   C-ARM 1-60 MINUTES  Fluoroscopy was utilized by the requesting physician.  No radiographic  interpretation.     Scheduled Meds: . amiodarone  200 mg Oral q morning - 10a  . antiseptic oral rinse  15 mL Mouth Rinse BID  . aspirin EC  81 mg Oral q morning - 10a  . atorvastatin  40  mg Oral QPM  . feeding supplement  237 mL Oral BID BM  . ferrous sulfate  325 mg Oral TID PC  . furosemide  20 mg Intravenous Once  . metoprolol  75 mg Oral BID  . multivitamin with minerals  1 tablet Oral Daily  . pantoprazole  40 mg Oral Daily  . polyethylene glycol  17 g Oral q morning - 10a  . Warfarin - Pharmacist Dosing Inpatient   Does not apply q1800   Continuous Infusions: . sodium chloride 100 mL/hr at 09/22/12 [id number]    Principal Problem:   Closed left hip fracture Active Problems:   Atrial fibrillation   Long term (current) use of anticoagulants   Osteoarthritis   Cervical spondylolysis  Compression fracture   Osteoporosis   Dyslipidemia   Hypertension   Acute blood loss anemia    Time spent: > 35 mins    [facility] Pager [phone]. If 7PM-7AM, please contact night-coverage at [email or web address], password [email or web address] 09/22/2012, 1:59 PM  LOS: 1 day

## 2012-09-22 NOTE — Progress Notes (Signed)
Subjective:  Patient reports pain as moderate.  No events  Objective:   VITALS:   Filed Vitals:   09/21/12 1700 09/21/12 2127 09/22/12 0219 09/22/12 0531  BP: 136/46 122/51 112/51 109/54  Pulse: 63 60 61 60  Temp: 98.1 F (36.7 C) 97.7 F (36.5 C) 98.2 F (36.8 C) 98 F (36.7 C)  TempSrc: Oral Oral Oral Oral  Resp: 18 19 19 18   Height:      Weight:      SpO2: 98% 100% 100% 100%    Neurologically intact Neurovascular intact Sensation intact distally Intact pulses distally Dorsiflexion/Plantar flexion intact Incision: dressing C/D/I and no drainage No cellulitis present Compartment soft   Lab Results  Component Value Date   WBC 12.9* 09/22/2012   HGB 6.3* 09/22/2012   HCT 18.2* 09/22/2012   MCV 100.0 09/22/2012   PLT 132* 09/22/2012     Assessment/Plan: 1 Day Post-Op   Principal Problem:   Closed left hip fracture Active Problems:   Atrial fibrillation   Long term (current) use of anticoagulants   Osteoarthritis   Cervical spondylolysis   Compression fracture   Osteoporosis   Dyslipidemia   Hypertension   Advance diet Up with therapy DVT ppx Pain control Discharge planning   Holly Weaver 09/22/2012, 10:13 AM

## 2012-09-22 NOTE — Progress Notes (Signed)
PHARMACY CONSULT NOTE - COUMADIN  See previous pharmacy notes for full details. Spoke with Dr. Grandville Silos, orders received to hold coumadin tonight and restart tomorrow.  Peggyann Juba, PharmD, BCPS 09/22/2012 6:03 PM

## 2012-09-22 NOTE — Progress Notes (Signed)
PT Cancellation Note  Patient Details Name: Holly Weaver MRN: [id number] DOB: 1923/08/18   Cancelled Treatment:    Reason Eval/Treat Not Completed: Medical issues which prohibited therapy (Hgb 6.3). Will follow.   Holly Weaver 09/22/2012, 12:00 PM [phone]

## 2012-09-23 ENCOUNTER — Encounter (HOSPITAL_COMMUNITY): Payer: Self-pay | Admitting: Cardiology

## 2012-09-23 DIAGNOSIS — I341 Nonrheumatic mitral (valve) prolapse: Secondary | ICD-10-CM

## 2012-09-23 DIAGNOSIS — Z7901 Long term (current) use of anticoagulants: Secondary | ICD-10-CM

## 2012-09-23 DIAGNOSIS — I2510 Atherosclerotic heart disease of native coronary artery without angina pectoris: Secondary | ICD-10-CM | POA: Diagnosis present

## 2012-09-23 HISTORY — DX: Nonrheumatic mitral (valve) prolapse: I34.1

## 2012-09-23 LAB — COMPREHENSIVE METABOLIC PANEL
ALT: 22 U/L (ref 0–35)
AST: 43 U/L — ABNORMAL HIGH (ref 0–37)
Albumin: 2.9 g/dL — ABNORMAL LOW (ref 3.5–5.2)
Alkaline Phosphatase: 48 U/L (ref 39–117)
CO2: 30 mEq/L (ref 19–32)
Chloride: 99 mEq/L (ref 96–112)
GFR calc non Af Amer: 62 mL/min — ABNORMAL LOW (ref >90)
Potassium: 4.1 mEq/L (ref 3.5–5.1)
Sodium: 133 mEq/L — ABNORMAL LOW (ref 135–145)
Total Bilirubin: 0.9 mg/dL (ref 0.3–1.2)

## 2012-09-23 LAB — TYPE AND SCREEN
Antibody Screen: NEGATIVE
Unit division: 0

## 2012-09-23 LAB — URINE CULTURE
Colony Count: NO GROWTH
Culture: NO GROWTH

## 2012-09-23 LAB — PROTIME-INR
INR: 1.07 (ref 0.00–1.49)
Prothrombin Time: 13.7 seconds (ref 11.6–15.2)

## 2012-09-23 LAB — CBC
HCT: 24.8 % — ABNORMAL LOW (ref 36.0–46.0)
MCHC: 35.5 g/dL (ref 30.0–36.0)
Platelets: 104 10*3/uL — ABNORMAL LOW (ref 150–400)
RDW: 16.5 % — ABNORMAL HIGH (ref 11.5–15.5)

## 2012-09-23 MED ORDER — ENSURE COMPLETE PO LIQD: 237.0000 mL | Freq: Two times a day (BID) | ORAL | Status: DC

## 2012-09-23 MED ORDER — LETROZOLE 2.5 MG PO TABS
2.5000 mg | ORAL_TABLET | Freq: Every morning | ORAL | Status: DC
  Administered 2012-09-23 – 2012-09-24 (×2): 2.5 mg via ORAL
  Filled 2012-09-23 (×2): qty 1

## 2012-09-23 MED ORDER — OXYCODONE-ACETAMINOPHEN 5-325 MG PO TABS: 1.0000 | ORAL_TABLET | ORAL | Status: DC | PRN

## 2012-09-23 MED ORDER — SODIUM CHLORIDE 0.9 % IJ SOLN
3.0000 mL | Freq: Two times a day (BID) | INTRAMUSCULAR | Status: DC
  Administered 2012-09-23 – 2012-09-24 (×3): 3 mL via INTRAVENOUS

## 2012-09-23 NOTE — Evaluation (Signed)
Physical Therapy Evaluation Patient Details Name: Holly Weaver MRN: [id number] DOB: 05/29/1923 Today's Date: 09/23/2012 Time: 1130-1200 PT Time Calculation (min): 30 min  PT Assessment / Plan / Recommendation History of Present Illness  Holly Weaver is a 79 y.o. female admitted 09/21/12 after fall at home.with Past medical history of osteoporosis, osteoarthritis, compression fracture of lumbar spine, cervical spondylosis with chronic malalignment of the cervical spine, atrial fibrillation on Coumadin, hypertension.S/P ORIFof L hip fracture on 09/21/12., post op ABLA, s/p 2 units of blood.  Clinical Impression  Pt had much difficulty attempting to stand at [facility]. Pt requiring 2 person assist for transfers. Pt will benefit from PT while in acute care. Recommend SNF.    PT Assessment  Patient needs continued PT services    Follow Up Recommendations  SNF    Does the patient have the potential to tolerate intense rehabilitation      Barriers to Discharge Decreased caregiver support      Equipment Recommendations  None recommended by PT    Recommendations for Other Services     Frequency Min 3X/week    Precautions / Restrictions Precautions Precautions: Fall Restrictions Weight Bearing Restrictions: Yes LLE Weight Bearing: Touchdown weight bearing   Pertinent Vitals/Pain sats low 90'2 0n RA. Replaced to 2 l. Holly Weaver  c/o L hip pain when moving. Had been premedicated. Ice applied.      Mobility  Bed Mobility Bed Mobility: Supine to Sit Supine to Sit: 2: Max assist Details for Bed Mobility Assistance: assistance for LLE support. bed pad used to slide pt to edge. Transfers Transfers: Sit to Stand;Stand to Sit;Stand Pivot Transfers Sit to Stand: 1: +2 Total assist;From bed;From chair/3-in-1;From elevated surface Sit to Stand: Patient Percentage: 30% Stand to Sit: To chair/3-in-1;With upper extremity assist;1: +2 Total assist Stand to Sit: Patient Percentage: 40% Stand  Pivot Transfers: 1: +2 Total assist Stand Pivot Transfers: Patient Percentage: 30% Details for Transfer Assistance: Pt had much difficulty utilizing the RW for standing. Cues for TDWB, pt was not really able to adhere as she could not stand erect. Ambulation/Gait Ambulation/Gait Assistance: Not tested (comment)    Exercises     PT Diagnosis: Difficulty walking;Generalized weakness;Acute pain  PT Problem List: Decreased strength;Decreased range of motion;Decreased activity tolerance;Decreased balance;Decreased mobility;Decreased knowledge of use of DME;Decreased safety awareness;Decreased skin integrity;Pain;Decreased knowledge of precautions PT Treatment Interventions: DME instruction;Functional mobility training;Therapeutic activities;Therapeutic exercise;Patient/family education     PT Goals(Current goals can be found in the care plan section) Acute Rehab PT Goals Patient Stated Goal: I want to get up. PT Goal Formulation: With patient Time For Goal Achievement: 10/07/12 Potential to Achieve Goals: Fair  Visit Information  Last PT Received On: 09/23/12 Assistance Needed: +2 History of Present Illness: Holly Weaver is a 79 y.o. female admitted 09/21/12 after fall at home.with Past medical history of osteoporosis, osteoarthritis, compression fracture of lumbar spine, cervical spondylosis with chronic malalignment of the cervical spine, atrial fibrillation on Coumadin, hypertension.S/P ORIF , post op ABLA, s/p 2 units of blood.       Prior Functioning  Home Living Family/patient expects to be discharged to:: Skilled nursing facility Living Arrangements: Alone    Cognition  Cognition Arousal/Alertness: Awake/alert Behavior During Therapy: Anxious Overall Cognitive Status: No family/caregiver present to determine baseline cognitive functioning Memory: Decreased recall of precautions    Extremity/Trunk Assessment Upper Extremity Assessment Upper Extremity Assessment:  Generalized weakness Lower Extremity Assessment Lower Extremity Assessment: LLE deficits/detail LLE Deficits / Details: requires assistance for  moving Leg to ege of bed Cervical / Trunk Assessment Cervical / Trunk Assessment:  (sits with trunk shifted to the Right.)   Balance Balance Balance Assessed: Yes Static Sitting Balance Static Sitting - Balance Support: Bilateral upper extremity supported Static Sitting - Level of Assistance: 5: Stand by assistance;4: Min assist Static Sitting - Comment/# of Minutes: at times pt would shift to the right and lean over the armrest, support provided to trunk.  End of Session PT - End of Session Equipment Utilized During Treatment: Gait belt Activity Tolerance: Patient limited by fatigue;Patient limited by pain Patient left: in chair;with call bell/phone within reach;with chair alarm set Nurse Communication: Mobility status;Need for lift equipment  GP     Claretha Cooper 09/23/2012, 1:24 PM Tresa Endo PT [phone]

## 2012-09-23 NOTE — Progress Notes (Signed)
ANTICOAGULATION CONSULT NOTE - Follow-up Consult  Pharmacy Consult for Warfarin Indication: atrial fibrillation  Allergies  Allergen Reactions  . Codeine Nausea And Vomiting   Patient Measurements: Height: 5\' 5"  (165.1 cm) Weight: 114 lb 9.6 oz (51.982 kg) IBW/kg (Calculated) : 57  Vital Signs: Temp: 98.7 F (37.1 C) (09/02 0438) Temp src: Oral (09/02 0438) BP: 138/61 mmHg (09/02 0438) Pulse Rate: 53 (09/02 0438)  Labs:  Recent Labs  09/21/12 0233 09/21/12 1010 09/22/12 0415  09/22/12 0900 09/22/12 1946 09/23/12 0404  HGB 11.6*  --   --   < > 6.3* 10.0* 8.8*  HCT 34.1*  --   --   < > 18.2* 28.0* 24.8*  PLT 177  --   --   < > 132* 126* 104*  LABPROT 30.5* 22.2* 14.9  --   --   --  13.7  INR 3.06* 2.02* 1.20  --   --   --  1.07  CREATININE 0.94  --  0.80  --   --   --  0.83  < > = values in this interval not displayed. Estimated Creatinine Clearance: 39.2 ml/min (by C-G formula based on Cr of 0.83).  Medications:  Scheduled:  . amiodarone  200 mg Oral q morning - 10a  . antiseptic oral rinse  15 mL Mouth Rinse BID  . aspirin EC  81 mg Oral q morning - 10a  . atorvastatin  40 mg Oral QPM  . feeding supplement  237 mL Oral BID BM  . ferrous sulfate  325 mg Oral TID PC  . metoprolol  75 mg Oral BID  . multivitamin with minerals  1 tablet Oral Daily  . pantoprazole  40 mg Oral Daily  . polyethylene glycol  17 g Oral q morning - 10a  . Warfarin - Pharmacist Dosing Inpatient   Does not apply q1800   Assessment: 58 yoF on chronic warfarin for Afib s/p Hip Fx repair 8/31. Admit INR was 3.06, received Vit K IV 10mg  x1, 5mg  po x1, 2 units FFP prior to surgery.  Warfarin per pharmacy resumed post-op.    Home regimen: Warfarin 2.5mg  daily except 3.75mg  on Monday with the last dose 8/30  Warfarin was ordered to resume 8/31, but was not given per MD d/t a small amount of bright red bleeding to left hip dressing.  Warfarin was again held on 9/1 due to Hgb decrease to 5.9 and  large hematoma on left arm.  INR = 1.07 -subtherapeutic as expected (2 doses vitamin K and warfarin held)  Hgb 8.8 (s/p transfusion 9/1) and Plt 104  Pt also on amiodarone and asa 81mg .  Regular diet ordered.  SCDs documented "on."    Goal of Therapy:  INR 2-3   Plan:   Awaiting cardiology assessment of long term anticoagulation plan.    No warfarin order yet today.  Daily PT/INR  Gretta Arab PharmD, BCPS Pager [phone] 09/23/2012 1:31 PM

## 2012-09-23 NOTE — Progress Notes (Signed)
[facility] PROGRESS NOTE  Holly Weaver [id number] DOB: 07-31-1923 DOA: 09/21/2012 PCP: Janalyn Rouse, MD  Assessment/Plan: #1 left hip fracture intertrochanteric Secondary to mechanical fall. Patient is status post IM nail per Dr. Sharol Given 09/21/2012. PT/OT. Per orthopedics. ASA 81 mg for dvt prophylaxis per ortho. F/U with ortho as outpatient in 2 weeks.  #2 acute blood loss anemia Likely secondary to surgery and elevated INR. No overt GI bleed. Hemoglobin today is 8.8 from 6.3 from 11.6 on admission. Patient is s/p 2 units packed red blood cells. Follow H&H. Monitor.  #3 atrial fibrillation On admission patient's INR was supratherapeutic. Per daughter this was patient's second fall. Patient's INR was reversed with FFP and vitamin K. Continue amiodarone and Lopressor for rate control. Patient has been seen by cardiology and patient deemed no longer a coumadin candidate secondary to falls and memory issues. Per cardiology ASA 81 mg daily for anticoagulation. Will not resume coumadin. Follow up with Dr Rollene Fare as outpatient. Cardiology ff and appreciate input and rxcs.  #4 hypertension Stable. Lopressor.  #5 prophylaxis SCDs  for DVT prophylaxis. PPI for GI prophylaxis.  Code Status: Full Family Communication: Updated patient and daughter at bedside. Disposition Plan: SNF when medically stable   Consultants:  Orthopedics: Dr. Sharol Given 09/21/2012  Cardiology'; Dr Debara Pickett 09/23/12  Procedures:  Chest x-ray 09/22/2002 2, 09/22/2012  X-ray of the left hip 09/21/2012  IM nail and femoral 09/21/2012 per Dr. Sharol Given  2 units packed red blood cells 09/22/2012  2 units FFP 09/21/2012  Antibiotics:  None  HPI/Subjective: Patient with no complaints. Patient denies any overt bleeding.  Objective: Filed Vitals:   09/23/12 1449  BP: 113/89  Pulse: 68  Temp: 98.2 F (36.8 C)  Resp: 18    Intake/Output Summary (Last 24 hours) at 09/23/12 1753 Last data filed at  09/23/12 1752  Gross per 24 hour  Intake 2631.67 ml  Output   2325 ml  Net 306.67 ml   Filed Weights   09/21/12 0803  Weight: 51.982 kg (114 lb 9.6 oz)    Exam:   General:  NAD  Cardiovascular: RRR  Respiratory: CTAB  Abdomen: sOFT/nt/nd/+bs  Musculoskeletal: No c/c/e  Data Reviewed: Basic Metabolic Panel:  Recent Labs Lab 09/21/12 0233 09/22/12 0415 09/23/12 0404  NA 134* 128* 133*  K 3.9 4.0 4.1  CL 98 93* 99  CO2 29 26 30   GLUCOSE 131* 187* 133*  BUN 14 15 14   CREATININE 0.94 0.80 0.83  CALCIUM 9.0 8.2* 7.9*   Liver Function Tests:  Recent Labs Lab 09/22/12 0415 09/23/12 0404  AST 35 43*  ALT 26 22  ALKPHOS 45 48  BILITOT 0.7 0.9  PROT 5.2* 5.2*  ALBUMIN 2.9* 2.9*   No results found for this basename: LIPASE, AMYLASE,  in the last 168 hours No results found for this basename: AMMONIA,  in the last 168 hours CBC:  Recent Labs Lab 09/21/12 0233 09/22/12 0805 09/22/12 0900 09/22/12 1946 09/23/12 0404  WBC 12.8* 11.1* 12.9* 16.3* 13.7*  NEUTROABS 10.9* 9.8* 11.4*  --   --   HGB 11.6* 5.9* 6.3* 10.0* 8.8*  HCT 34.1* 17.0* 18.2* 28.0* 24.8*  MCV 100.3* 98.8 100.0 94.3 93.6  PLT 177 128* 132* 126* 104*   Cardiac Enzymes: No results found for this basename: CKTOTAL, CKMB, CKMBINDEX, TROPONINI,  in the last 168 hours BNP (last 3 results) No results found for this basename: PROBNP,  in the last 8760 hours CBG: No results found for this basename:  GLUCAP,  in the last 168 hours  Recent Results (from the past 240 hour(s))  SURGICAL PCR SCREEN     Status: None   Collection Time    09/21/12  2:07 PM      Result Value Range Status   MRSA, PCR NEGATIVE  NEGATIVE Final   Staphylococcus aureus NEGATIVE  NEGATIVE Final   Comment:            The Xpert SA Assay (FDA     approved for NASAL specimens     in patients over 1 years of age),     is one component of     a comprehensive surveillance     program.  Test performance has     been  validated by [employer] for patients greater     than or equal to 7 year old.     It is not intended     to diagnose infection nor to     guide or monitor treatment.  URINE CULTURE     Status: None   Collection Time    09/22/12 10:12 AM      Result Value Range Status   Specimen Description URINE, CLEAN CATCH   Final   Special Requests NONE   Final   Culture  Setup Time     Final   Value: 09/22/2012 16:14     Performed at [facility]     Final   Value: NO GROWTH     Performed at [facility]   Culture     Final   Value: NO GROWTH     Performed at [facility]   Report Status 09/23/2012 FINAL   Final     Studies: Dg Chest Port 1 View  09/22/2012   *RADIOLOGY REPORT*  Clinical Data: Leukocytosis.  PORTABLE CHEST - 1 VIEW  Comparison: Portable chest 09/21/2012 and 01/10/2010.  Findings: The patient is rotated on the study.  Small focus airspace opacity is seen in the left lung base.  The right lung is clear.  There is cardiomegaly.  Hiatal hernia is identified.  IMPRESSION:  1.  Small focus of left basilar airspace opacity has an appearance most compatible with atelectasis rather than pneumonia. 2.  Cardiomegaly without edema. 3.  Hiatal hernia.   Original Report Authenticated By: Orlean Patten, M.D.    Scheduled Meds: . amiodarone  200 mg Oral q morning - 10a  . antiseptic oral rinse  15 mL Mouth Rinse BID  . aspirin EC  81 mg Oral q morning - 10a  . atorvastatin  40 mg Oral QPM  . feeding supplement  237 mL Oral BID BM  . ferrous sulfate  325 mg Oral TID PC  . letrozole  2.5 mg Oral q morning - 10a  . metoprolol  75 mg Oral BID  . multivitamin with minerals  1 tablet Oral Daily  . pantoprazole  40 mg Oral Daily  . polyethylene glycol  17 g Oral q morning - 10a  . sodium chloride  3 mL Intravenous Q12H   Continuous Infusions:    Principal Problem:   Closed left hip fracture Active Problems:   PAF (paroxysmal atrial  fibrillation), maintaining SR   Long term (current) use of anticoagulants   Osteoarthritis   Cervical spondylolysis   Compression fracture   Osteoporosis   Dyslipidemia   Hypertension   Acute blood loss anemia   CAD in native artery,  non obstructive by cath 2000   MVP (mitral valve prolapse)    Time spent: > 35 mins    [facility] Pager [phone]. If 7PM-7AM, please contact night-coverage at [email or web address], password [facility] 09/23/2012, 5:53 PM  LOS: 2 days

## 2012-09-23 NOTE — Consult Note (Signed)
Pt. Seen and examined. Agree with the NP/PA-C note as written.  Very pleasant 79 yo female patient of Dr. Rollene Fare with a history of PAF on warfarin and amiodarone. She has had very infrequent episodes of a-fib, but increasing problems with memory and 3 falls, the most recent included a femur fracture and significant bruising. She also had acute post-procedural blood loss anemia. I discussed the pro's/con's of ongoing warfarin therapy with her and her daughter and feel that the risks outweigh the benefits. I would therefore recommend low dose ASA 81 mg - the family realizes and accepts that this is less optimal (beneficial) for stroke reduction.  Follow-up with Dr. Rollene Fare or [facility] after rehabilitation in a few weeks to 1 month.  Pixie Casino, MD, [facility] Attending Cardiologist [facility]

## 2012-09-23 NOTE — Consult Note (Signed)
Reason for Consult: evaluate for continued coumadin use.   Referring Physician:  Dr. Patel [id number] DOUGLAS, MD Primary Cardiologist:Dr. London Sheer Holly Weaver is an 79 y.o. female.    Chief Complaint: admitted after fall with closed hip fracture of Lt leg.   HPI: 79 y.o. female with Past medical history of osteoporosis, osteoarthritis, compression fracture of lumbar spine, cervical spondylosis with chronic malalignment of the cervical spine, p.atrial fibrillation on Coumadin, MVP, hx CAD with nonobstructive disease 2000( awaiting office records) and hypertension.  [facility] with EF 50-55%, mild AR, mild to mod. MR, and mod-severe TR,   PA pk pressue 56mmHg.   Last nuc study 05/2002 without ischemia.  EF 80%., mild ant.wall thinning toward the apex.    She presented 09/21/12 with the complaint of fall. She was sleeping at the edge of the bed, and she rolled over onto the ground. When she tried to stand she couldn't because of the pain in the left hip and had another fall. After that she asked for help and was brought to the ER. She denied any complaint of chest pain, dizziness, headache, neck pain, focal neurological deficit, nausea, vomiting, abdominal pain, urinary symptoms, diarrhea, active bleeding. She mentioned she was at her baseline when she went to sleep. She mentioned there is no change in her medication.  She underwent IM nail to Lt femur.  Today is post op Day 2.  She developed acute anemia with drop of Hgb from 11.6 to 5.9.  Coumadin has been stopped.   She has been transfused 2 units of PRBCs.  Hgb up to 10, today at 8.8.  EKG on admit with SR no acute EKG changes.     Past Medical History  Diagnosis Date  . Breast cancer   . Hypertension   . Osteoporosis   . Scoliosis   . Atrial fibrillation   . Compression fracture 09/21/2012  . Cervical spondylolysis 09/21/2012  . Osteoarthritis 09/21/2012  . Dyslipidemia 09/21/2012  . MVP (mitral valve prolapse)  09/23/2012    Past Surgical History  Procedure Laterality Date  . Hernia repair      Family History  Problem Relation Age of Onset  . Cancer Mother   . CAD Father   . Cancer Other    Social History:  reports that she has never smoked. She does not have any smokeless tobacco history on file. She reports that she does not drink alcohol or use illicit drugs.  Allergies:  Allergies  Allergen Reactions  . Codeine Nausea And Vomiting    Medications Prior to Admission  Medication Sig Dispense Refill  . acetaminophen (TYLENOL) 325 MG tablet Take 325 mg by mouth every 4 (four) hours as needed for pain.      Marland Kitchen alendronate (FOSAMAX) 70 MG tablet Take 70 mg by mouth every 7 (seven) days. Take with a full glass of water on an empty stomach.      . ALPRAZolam (XANAX) 0.5 MG tablet Take 0.5 mg by mouth every 8 (eight) hours as needed for anxiety.       Marland Kitchen amiodarone (PACERONE) 200 MG tablet Take 200 mg by mouth every morning.       Marland Kitchen amLODipine (NORVASC) 5 MG tablet Take 5 mg by mouth every morning.       Marland Kitchen aspirin EC 81 MG tablet Take 81 mg by mouth every morning.       Marland Kitchen atorvastatin (LIPITOR) 40 MG tablet Take 40  mg by mouth every evening.       . calcium-vitamin D (OSCAL WITH D) 500-200 MG-UNIT per tablet Take 1 tablet by mouth every morning.       Marland Kitchen letrozole (FEMARA) 2.5 MG tablet Take 2.5 mg by mouth every morning.      . metoprolol (LOPRESSOR) 100 MG tablet Take 100 mg by mouth 2 (two) times daily.      . Multiple Vitamin (MULTIVITAMIN WITH MINERALS) TABS Take 1 tablet by mouth every morning. Centrum 50+      . olmesartan (BENICAR) 40 MG tablet Take 40 mg by mouth daily.      Marland Kitchen omeprazole (PRILOSEC) 20 MG capsule Take 20 mg by mouth daily as needed (for acid reduction).       . polyethylene glycol (MIRALAX / GLYCOLAX) packet Take 17 g by mouth every morning.       . traMADol (ULTRAM) 50 MG tablet Take 50 mg by mouth every 8 (eight) hours as needed for pain.       Marland Kitchen  triamterene-hydrochlorothiazide (DYAZIDE) 37.5-25 MG per capsule Take 1 capsule by mouth every morning.      . Vitamin D, Ergocalciferol, (DRISDOL) 50000 UNITS CAPS Take 50,000 Units by mouth every 7 (seven) days.      Marland Kitchen warfarin (COUMADIN) 2.5 MG tablet Take 2.5-3.75 mg by mouth every evening. Take 1.5 tabs on mon, and 1 tab on all other days        Results for orders placed during the hospital encounter of 09/21/12 (from the past 48 hour(s))  PROTIME-INR     Status: None   Collection Time    09/22/12  4:15 AM      Result Value Range   Prothrombin Time 14.9  11.6 - 15.2 seconds   INR 1.20  0.00 - 1.49  COMPREHENSIVE METABOLIC PANEL     Status: Abnormal   Collection Time    09/22/12  4:15 AM      Result Value Range   Sodium 128 (*) 135 - 145 mEq/L   Potassium 4.0  3.5 - 5.1 mEq/L   Chloride 93 (*) 96 - 112 mEq/L   CO2 26  19 - 32 mEq/L   Glucose, Bld 187 (*) 70 - 99 mg/dL   BUN 15  6 - 23 mg/dL   Creatinine, Ser 0.80  0.50 - 1.10 mg/dL   Calcium 8.2 (*) 8.4 - 10.5 mg/dL   Total Protein 5.2 (*) 6.0 - 8.3 g/dL   Albumin 2.9 (*) 3.5 - 5.2 g/dL   AST 35  0 - 37 U/L   ALT 26  0 - 35 U/L   Alkaline Phosphatase 45  39 - 117 U/L   Total Bilirubin 0.7  0.3 - 1.2 mg/dL   GFR calc non Af Amer 64 (*) >90 mL/min   GFR calc Af Amer 75 (*) >90 mL/min   Comment: (NOTE)     The eGFR has been calculated using the CKD EPI equation.     This calculation has not been validated in all clinical situations.     eGFR's persistently <90 mL/min signify possible Chronic Kidney     Disease.  CBC WITH DIFFERENTIAL     Status: Abnormal   Collection Time    09/22/12  8:05 AM      Result Value Range   WBC 11.1 (*) 4.0 - 10.5 K/uL   RBC 1.72 (*) 3.87 - 5.11 MIL/uL   Hemoglobin 5.9 (*) 12.0 - 15.0 g/dL  Comment: DELTA CHECK NOTED     REPEATED TO VERIFY     CRITICAL RESULT CALLED TO, READ BACK BY AND VERIFIED WITH:     E.REYNOLDS RN AT [id number] ON [id number] BY C.BONGEL   HCT 17.0 (*) 36.0 - 46.0 %   MCV 98.8   78.0 - 100.0 fL   MCH 34.3 (*) 26.0 - 34.0 pg   MCHC 34.7  30.0 - 36.0 g/dL   RDW 14.3  11.5 - 15.5 %   Platelets 128 (*) 150 - 400 K/uL   Comment: DELTA CHECK NOTED     SPECIMEN CHECKED FOR CLOTS     REPEATED TO VERIFY   Neutrophils Relative % 88 (*) 43 - 77 %   Neutro Abs 9.8 (*) 1.7 - 7.7 K/uL   Lymphocytes Relative 6 (*) 12 - 46 %   Lymphs Abs 0.6 (*) 0.7 - 4.0 K/uL   Monocytes Relative 7  3 - 12 %   Monocytes Absolute 0.8  0.1 - 1.0 K/uL   Eosinophils Relative 0  0 - 5 %   Eosinophils Absolute 0.0  0.0 - 0.7 K/uL   Basophils Relative 0  0 - 1 %   Basophils Absolute 0.0  0.0 - 0.1 K/uL  OSMOLALITY     Status: None   Collection Time    09/22/12  8:05 AM      Result Value Range   Osmolality 276  275 - 300 mOsm/kg   Comment: Performed at [facility]  CBC WITH DIFFERENTIAL     Status: Abnormal   Collection Time    09/22/12  9:00 AM      Result Value Range   WBC 12.9 (*) 4.0 - 10.5 K/uL   RBC 1.82 (*) 3.87 - 5.11 MIL/uL   Hemoglobin 6.3 (*) 12.0 - 15.0 g/dL   Comment: RESULTS VERIFIED VIA RECOLLECT     CRITICAL VALUE NOTED.  VALUE IS CONSISTENT WITH PREVIOUSLY REPORTED AND CALLED VALUE.   HCT 18.2 (*) 36.0 - 46.0 %   MCV 100.0  78.0 - 100.0 fL   MCH 34.6 (*) 26.0 - 34.0 pg   MCHC 34.6  30.0 - 36.0 g/dL   RDW 14.5  11.5 - 15.5 %   Platelets 132 (*) 150 - 400 K/uL   Neutrophils Relative % 89 (*) 43 - 77 %   Neutro Abs 11.4 (*) 1.7 - 7.7 K/uL   Lymphocytes Relative 5 (*) 12 - 46 %   Lymphs Abs 0.6 (*) 0.7 - 4.0 K/uL   Monocytes Relative 7  3 - 12 %   Monocytes Absolute 0.9  0.1 - 1.0 K/uL   Eosinophils Relative 0  0 - 5 %   Eosinophils Absolute 0.0  0.0 - 0.7 K/uL   Basophils Relative 0  0 - 1 %   Basophils Absolute 0.0  0.0 - 0.1 K/uL  PREPARE RBC (CROSSMATCH)     Status: None   Collection Time    09/22/12 10:00 AM      Result Value Range   Order Confirmation ORDER PROCESSED BY BLOOD BANK    SODIUM, URINE, RANDOM     Status: None   Collection Time     09/22/12 10:12 AM      Result Value Range   Sodium, Ur 39     Comment: Performed at [place], URINE, RANDOM     Status: None   Collection Time    09/22/12 10:12 AM  Result Value Range   Creatinine, Urine 88.1     Comment: Performed at [place], URINE     Status: None   Collection Time    09/22/12 10:12 AM      Result Value Range   Osmolality, Ur 482  390 - 1090 mOsm/kg   Comment: Performed at [place]     Status: Abnormal   Collection Time    09/22/12 10:12 AM      Result Value Range   Color, Urine YELLOW  YELLOW   APPearance CLEAR  CLEAR   Specific Gravity, Urine 1.021  1.005 - 1.030   pH 6.0  5.0 - 8.0   Glucose, UA 500 (*) NEGATIVE mg/dL   Hgb urine dipstick TRACE (*) NEGATIVE   Bilirubin Urine NEGATIVE  NEGATIVE   Ketones, ur NEGATIVE  NEGATIVE mg/dL   Protein, ur NEGATIVE  NEGATIVE mg/dL   Urobilinogen, UA 0.2  0.0 - 1.0 mg/dL   Nitrite NEGATIVE  NEGATIVE   Leukocytes, UA NEGATIVE  NEGATIVE  URINE CULTURE     Status: None   Collection Time    09/22/12 10:12 AM      Result Value Range   Specimen Description URINE, CLEAN CATCH     Special Requests NONE     Culture  Setup Time       Value: 09/22/2012 16:14     Performed at [facility]       Value: NO GROWTH     Performed at [facility]   Culture       Value: NO GROWTH     Performed at [facility]   Report Status 09/23/2012 FINAL    URINE MICROSCOPIC-ADD ON     Status: None   Collection Time    09/22/12 10:12 AM      Result Value Range   WBC, UA 0-2  <3 WBC/hpf   RBC / HPF 0-2  <3 RBC/hpf  CBC     Status: Abnormal   Collection Time    09/22/12  7:46 PM      Result Value Range   WBC 16.3 (*) 4.0 - 10.5 K/uL   RBC 2.97 (*) 3.87 - 5.11 MIL/uL   Hemoglobin 10.0 (*) 12.0 - 15.0 g/dL   Comment: DELTA CHECK NOTED     REPEATED TO VERIFY     POST TRANSFUSION SPECIMEN    HCT 28.0 (*) 36.0 - 46.0 %   MCV 94.3  78.0 - 100.0 fL   MCH 33.7  26.0 - 34.0 pg   MCHC 35.7  30.0 - 36.0 g/dL   RDW 16.0 (*) 11.5 - 15.5 %   Platelets 126 (*) 150 - 400 K/uL  PROTIME-INR     Status: None   Collection Time    09/23/12  4:04 AM      Result Value Range   Prothrombin Time 13.7  11.6 - 15.2 seconds   INR 1.07  0.00 - 1.49  COMPREHENSIVE METABOLIC PANEL     Status: Abnormal   Collection Time    09/23/12  4:04 AM      Result Value Range   Sodium 133 (*) 135 - 145 mEq/L   Potassium 4.1  3.5 - 5.1 mEq/L   Chloride 99  96 - 112 mEq/L   CO2 30  19 - 32 mEq/L   Glucose, Bld 133 (*) 70 - 99 mg/dL  BUN 14  6 - 23 mg/dL   Creatinine, Ser 0.83  0.50 - 1.10 mg/dL   Calcium 7.9 (*) 8.4 - 10.5 mg/dL   Total Protein 5.2 (*) 6.0 - 8.3 g/dL   Albumin 2.9 (*) 3.5 - 5.2 g/dL   AST 43 (*) 0 - 37 U/L   ALT 22  0 - 35 U/L   Alkaline Phosphatase 48  39 - 117 U/L   Total Bilirubin 0.9  0.3 - 1.2 mg/dL   GFR calc non Af Amer 62 (*) >90 mL/min   GFR calc Af Amer 71 (*) >90 mL/min   Comment: (NOTE)     The eGFR has been calculated using the CKD EPI equation.     This calculation has not been validated in all clinical situations.     eGFR's persistently <90 mL/min signify possible Chronic Kidney     Disease.  CBC     Status: Abnormal   Collection Time    09/23/12  4:04 AM      Result Value Range   WBC 13.7 (*) 4.0 - 10.5 K/uL   RBC 2.65 (*) 3.87 - 5.11 MIL/uL   Hemoglobin 8.8 (*) 12.0 - 15.0 g/dL   HCT 24.8 (*) 36.0 - 46.0 %   MCV 93.6  78.0 - 100.0 fL   MCH 33.2  26.0 - 34.0 pg   MCHC 35.5  30.0 - 36.0 g/dL   RDW 16.5 (*) 11.5 - 15.5 %   Platelets 104 (*) 150 - 400 K/uL   Comment: SPECIMEN CHECKED FOR CLOTS     REPEATED TO VERIFY     PLATELET COUNT CONFIRMED BY SMEAR   Dg Chest Port 1 View  09/22/2012   *RADIOLOGY REPORT*  Clinical Data: Leukocytosis.  PORTABLE CHEST - 1 VIEW  Comparison: Portable chest 09/21/2012 and 01/10/2010.  Findings: The patient is rotated on the  study.  Small focus airspace opacity is seen in the left lung base.  The right lung is clear.  There is cardiomegaly.  Hiatal hernia is identified.  IMPRESSION:  1.  Small focus of left basilar airspace opacity has an appearance most compatible with atelectasis rather than pneumonia. 2.  Cardiomegaly without edema. 3.  Hiatal hernia.   Original Report Authenticated By: Orlean Patten, M.D.   Dg C-arm 1-60 Min-no Report  09/21/2012   CLINICAL DATA: LEFT HIP FRACTURE   C-ARM 1-60 MINUTES  Fluoroscopy was utilized by the requesting physician.  No radiographic  interpretation.     ROS: General:no colds or fevers, no weight changes Skin:no rashes or ulcers HEENT:no blurred vision, no congestion, this is her second fall previous fall in June with injury to nose and black eye. CV:see HPI no chest pain. Had been having lower ext edema. PUL:see HPI, no SOB GI:no diarrhea constipation or melena, no indigestion GU:no hematuria, no dysuria MS:Lt hip pain, no claudication Neuro:no syncope, no lightheadedness Endo:no diabetes, no thyroid disease   Blood pressure 138/61, pulse 53, temperature 98.7 F (37.1 C), temperature source Oral, resp. rate 18, height 5\' 5"  (1.651 m), weight 114 lb 9.6 oz (51.982 kg), SpO2 99.00%. PE: General:Pleasant affect, NAD Skin:Warm and dry, brisk capillary refill HEENT:normocephalic, sclera clear, mucus membranes moist Neck:supple, no JVD, no bruits  Heart:S1S2 RRR with [id number] systolic murmur, no gallup, rub or click Lungs:clear ant. without rales, rhonchi, or wheezes [id number], non tender, + BS, do not palpate liver spleen or masses Ext:no lower ext edema, 1+ pedal pulses, 2+ radial pulses Neuro:alert and oriented, MAE,  follows commands, + facial symmetry    Assessment/Plan Principal Problem:   Closed left hip fracture Active Problems:   PAF (paroxysmal atrial fibrillation), maintaining SR   Long term (current) use of anticoagulants   Osteoarthritis   Cervical  spondylolysis   Compression fracture   Osteoporosis   Dyslipidemia   Hypertension   Acute blood loss anemia   CAD in native artery, non obstructive by cath 2000   MVP (mitral valve prolapse)  PLAN:  Post op Day 2 s/p IM nailing of Lt hip.  H/H improved after transfusion of 2 units PRBCs.  Coumadin held.  On Last visit with Dr. Rollene Fare he was concerned about her chronic coumadin and advancing age with OBS.  But until June she has had no falls.  This is her second fall this summer.  Dr. Debara Pickett to see.  prob stop coumadin due to risk and continue asprin.  Pt to go to [facility] for rehab.  Will schedule appt near end on month with Dr. Rollene Fare for follow up.  [place] Practitioner Certified [facility] Pager [phone] 09/23/2012, 2:57 PM

## 2012-09-24 ENCOUNTER — Encounter (HOSPITAL_COMMUNITY): Payer: Self-pay | Admitting: Orthopedic Surgery

## 2012-09-24 DIAGNOSIS — S72009D Fracture of unspecified part of neck of unspecified femur, subsequent encounter for closed fracture with routine healing: Secondary | ICD-10-CM | POA: Diagnosis not present

## 2012-09-24 DIAGNOSIS — S72009A Fracture of unspecified part of neck of unspecified femur, initial encounter for closed fracture: Secondary | ICD-10-CM | POA: Diagnosis not present

## 2012-09-24 DIAGNOSIS — R627 Adult failure to thrive: Secondary | ICD-10-CM | POA: Diagnosis not present

## 2012-09-24 DIAGNOSIS — D62 Acute posthemorrhagic anemia: Secondary | ICD-10-CM | POA: Diagnosis not present

## 2012-09-24 DIAGNOSIS — Z7982 Long term (current) use of aspirin: Secondary | ICD-10-CM | POA: Diagnosis not present

## 2012-09-24 DIAGNOSIS — N390 Urinary tract infection, site not specified: Secondary | ICD-10-CM | POA: Diagnosis not present

## 2012-09-24 DIAGNOSIS — E871 Hypo-osmolality and hyponatremia: Secondary | ICD-10-CM | POA: Diagnosis present

## 2012-09-24 DIAGNOSIS — M6281 Muscle weakness (generalized): Secondary | ICD-10-CM | POA: Diagnosis not present

## 2012-09-24 DIAGNOSIS — S72143A Displaced intertrochanteric fracture of unspecified femur, initial encounter for closed fracture: Secondary | ICD-10-CM | POA: Diagnosis not present

## 2012-09-24 DIAGNOSIS — J189 Pneumonia, unspecified organism: Secondary | ICD-10-CM | POA: Diagnosis not present

## 2012-09-24 DIAGNOSIS — M1990 Unspecified osteoarthritis, unspecified site: Secondary | ICD-10-CM | POA: Diagnosis present

## 2012-09-24 DIAGNOSIS — IMO0002 Reserved for concepts with insufficient information to code with codable children: Secondary | ICD-10-CM

## 2012-09-24 DIAGNOSIS — R1312 Dysphagia, oropharyngeal phase: Secondary | ICD-10-CM | POA: Diagnosis not present

## 2012-09-24 DIAGNOSIS — S72009S Fracture of unspecified part of neck of unspecified femur, sequela: Secondary | ICD-10-CM | POA: Diagnosis not present

## 2012-09-24 DIAGNOSIS — I059 Rheumatic mitral valve disease, unspecified: Secondary | ICD-10-CM | POA: Diagnosis present

## 2012-09-24 DIAGNOSIS — T8189XA Other complications of procedures, not elsewhere classified, initial encounter: Secondary | ICD-10-CM | POA: Diagnosis not present

## 2012-09-24 DIAGNOSIS — C50919 Malignant neoplasm of unspecified site of unspecified female breast: Secondary | ICD-10-CM | POA: Diagnosis present

## 2012-09-24 DIAGNOSIS — E873 Alkalosis: Secondary | ICD-10-CM | POA: Diagnosis not present

## 2012-09-24 DIAGNOSIS — Z79899 Other long term (current) drug therapy: Secondary | ICD-10-CM | POA: Diagnosis not present

## 2012-09-24 DIAGNOSIS — I509 Heart failure, unspecified: Secondary | ICD-10-CM | POA: Diagnosis present

## 2012-09-24 DIAGNOSIS — M47812 Spondylosis without myelopathy or radiculopathy, cervical region: Secondary | ICD-10-CM | POA: Diagnosis not present

## 2012-09-24 DIAGNOSIS — M810 Age-related osteoporosis without current pathological fracture: Secondary | ICD-10-CM | POA: Diagnosis not present

## 2012-09-24 DIAGNOSIS — R262 Difficulty in walking, not elsewhere classified: Secondary | ICD-10-CM | POA: Diagnosis not present

## 2012-09-24 DIAGNOSIS — D72829 Elevated white blood cell count, unspecified: Secondary | ICD-10-CM | POA: Diagnosis present

## 2012-09-24 DIAGNOSIS — R0902 Hypoxemia: Secondary | ICD-10-CM | POA: Diagnosis not present

## 2012-09-24 DIAGNOSIS — M25559 Pain in unspecified hip: Secondary | ICD-10-CM | POA: Diagnosis not present

## 2012-09-24 DIAGNOSIS — E559 Vitamin D deficiency, unspecified: Secondary | ICD-10-CM | POA: Diagnosis not present

## 2012-09-24 DIAGNOSIS — D500 Iron deficiency anemia secondary to blood loss (chronic): Secondary | ICD-10-CM | POA: Diagnosis not present

## 2012-09-24 DIAGNOSIS — A4902 Methicillin resistant Staphylococcus aureus infection, unspecified site: Secondary | ICD-10-CM | POA: Diagnosis present

## 2012-09-24 DIAGNOSIS — E43 Unspecified severe protein-calorie malnutrition: Secondary | ICD-10-CM | POA: Diagnosis not present

## 2012-09-24 DIAGNOSIS — J90 Pleural effusion, not elsewhere classified: Secondary | ICD-10-CM | POA: Diagnosis present

## 2012-09-24 DIAGNOSIS — T889XXS Complication of surgical and medical care, unspecified, sequela: Secondary | ICD-10-CM | POA: Diagnosis not present

## 2012-09-24 DIAGNOSIS — F411 Generalized anxiety disorder: Secondary | ICD-10-CM | POA: Diagnosis not present

## 2012-09-24 DIAGNOSIS — T8140XA Infection following a procedure, unspecified, initial encounter: Secondary | ICD-10-CM | POA: Diagnosis not present

## 2012-09-24 DIAGNOSIS — J438 Other emphysema: Secondary | ICD-10-CM | POA: Diagnosis not present

## 2012-09-24 DIAGNOSIS — D649 Anemia, unspecified: Secondary | ICD-10-CM | POA: Diagnosis not present

## 2012-09-24 DIAGNOSIS — E785 Hyperlipidemia, unspecified: Secondary | ICD-10-CM | POA: Diagnosis not present

## 2012-09-24 DIAGNOSIS — I2510 Atherosclerotic heart disease of native coronary artery without angina pectoris: Secondary | ICD-10-CM | POA: Diagnosis not present

## 2012-09-24 DIAGNOSIS — R279 Unspecified lack of coordination: Secondary | ICD-10-CM | POA: Diagnosis not present

## 2012-09-24 DIAGNOSIS — Z9181 History of falling: Secondary | ICD-10-CM | POA: Diagnosis not present

## 2012-09-24 DIAGNOSIS — I4891 Unspecified atrial fibrillation: Secondary | ICD-10-CM | POA: Diagnosis not present

## 2012-09-24 DIAGNOSIS — L02419 Cutaneous abscess of limb, unspecified: Secondary | ICD-10-CM | POA: Diagnosis present

## 2012-09-24 DIAGNOSIS — I10 Essential (primary) hypertension: Secondary | ICD-10-CM | POA: Diagnosis not present

## 2012-09-24 LAB — COMPREHENSIVE METABOLIC PANEL
AST: 41 U/L — ABNORMAL HIGH (ref 0–37)
Albumin: 2.6 g/dL — ABNORMAL LOW (ref 3.5–5.2)
Alkaline Phosphatase: 55 U/L (ref 39–117)
BUN: 14 mg/dL (ref 6–23)
Potassium: 3.9 mEq/L (ref 3.5–5.1)
Total Protein: 5.1 g/dL — ABNORMAL LOW (ref 6.0–8.3)

## 2012-09-24 LAB — CBC
HCT: 25.8 % — ABNORMAL LOW (ref 36.0–46.0)
MCV: 97.7 fL (ref 78.0–100.0)
RBC: 2.64 MIL/uL — ABNORMAL LOW (ref 3.87–5.11)
RDW: 17.1 % — ABNORMAL HIGH (ref 11.5–15.5)
WBC: 10.1 10*3/uL (ref 4.0–10.5)

## 2012-09-24 MED ORDER — TRAMADOL HCL 50 MG PO TABS: 50.0000 mg | ORAL_TABLET | Freq: Three times a day (TID) | ORAL | Status: DC | PRN

## 2012-09-24 MED ORDER — ALPRAZOLAM 0.5 MG PO TABS: 0.5000 mg | ORAL_TABLET | Freq: Three times a day (TID) | ORAL | Status: DC | PRN

## 2012-09-24 NOTE — Progress Notes (Signed)
Report called to [place] at [facility].  Ambulance has been called by social work for transport . Andre Lefort

## 2012-09-24 NOTE — Progress Notes (Signed)
Patient is set to discharge to [facility] today. Patient & daughter aware. Discharge packet in [place] with phone number for RN to call report. [facility] will be called for transport once daughter is at bedside.   Clinical Social Work Department CLINICAL SOCIAL WORK PLACEMENT NOTE 09/24/2012  Patient:  Holly Weaver, Holly Weaver  Account Number:  [id number] Admit date:  09/21/2012  Clinical Social Worker:  Renold Genta  Date/time:  09/22/2012 03:19 PM  Clinical Social Work is seeking post-discharge placement for this patient at the following level of care:   SKILLED NURSING   (*CSW will update this form in Epic as items are completed)   09/22/2012  Patient/family provided with [place] Department of Clinical Social Work's list of facilities offering this level of care within the geographic area requested by the patient (or if unable, by the patient's family).  09/22/2012  Patient/family informed of their freedom to choose among providers that offer the needed level of care, that participate in Medicare, Medicaid or managed care program needed by the patient, have an available bed and are willing to accept the patient.  09/22/2012  Patient/family informed of [facility]' ownership interest in [facility], as well as of the fact that they are under no obligation to receive care at this facility.  PASARR submitted to EDS on 09/22/2012 PASARR number received from EDS on 09/22/2012  FL2 transmitted to all facilities in geographic area requested by pt/family on  09/22/2012 FL2 transmitted to all facilities within larger geographic area on   Patient informed that his/her managed care company has contracts with or will negotiate with  certain facilities, including the following:     Patient/family informed of bed offers received:  09/22/2012 Patient chooses bed at [facility] Physician recommends and patient chooses bed at    Patient to be transferred to [facility] on   09/24/2012 Patient to be transferred to facility by [facility]  The following physician request were entered in Epic:   Additional Comments:   Winfred Leeds, [place] Worker cell #: [phone]

## 2012-09-24 NOTE — Discharge Summary (Signed)
Physician Discharge Summary  Holly Weaver [id number] DOB: 1923/08/01 DOA: 09/21/2012  PCP: Janalyn Rouse, MD  Admit date: 09/21/2012 Discharge date: 09/24/2012  Recommendations for Outpatient Follow-up:  1. Pt will need to follow up with PCP in 2 weeks post discharge 2. Please obtain BMP to evaluate electrolytes and kidney function 3. Please also check CBC to evaluate Hg and Hct levels   Discharge Diagnoses:  Principal Problem:   Closed left hip fracture Active Problems:   PAF (paroxysmal atrial fibrillation), maintaining SR   Long term (current) use of anticoagulants   Osteoarthritis   Cervical spondylolysis   Compression fracture   Osteoporosis   Dyslipidemia   Hypertension   Acute blood loss anemia   CAD in native artery, non obstructive by cath 2000   MVP (mitral valve prolapse) #1 left hip fracture intertrochanteric  Secondary to mechanical fall. Patient is status post IM nail per Dr. Sharol Given 09/21/2012.  Per orthopedics. ASA 81 mg for dvt prophylaxis per ortho. F/U with ortho (Dr. Sharol Given) as outpatient in 2 weeks.  -Continue physical therapy -continue tramadol prn pain #2 acute blood loss anemia  Likely secondary to surgery and elevated INR. No overt GI bleed. Hemoglobin on the day of discharge 8.6  -Hemoglobin nadir 5.9 on 09/22/2012 Patient is s/p 2 units packed red blood cells. Follow H&H. Monitor.  #3 atrial fibrillation  On admission patient's INR was supratherapeutic. Per daughter this was patient's second fall. Patient's INR was reversed with FFP and vitamin K. Continue amiodarone and Lopressor for rate control. Patient has been seen by cardiology and patient deemed no longer a coumadin candidate secondary to falls and memory issues. Per cardiology ASA 81 mg daily for anticoagulation. Will not resume coumadin. Follow up with Dr Rollene Fare as outpatient. Cardiology ff and appreciate input and rxcs.   #4 hypertension  Stable. -Continue metoprolol tartrate 75 mg  twice a day -Discontinue Dyazide due to hyponatremia -Patient was on amlodipine and Benicar prior to hospitalization--these medications will not be restarted as the patient's blood pressure has remained controlled on metoprolol tartrate -Her blood pressure will need to be monitored for continued adjustment or reinstitution of her amlodipine and or Benicar #5 prophylaxis  SCDs for DVT prophylaxis. PPI for GI prophylaxis.  Hypoxemia -likely atelectasis -Oxygen saturation 95-97% on 1 L -Wean oxygen as tolerated -Chest x-ray negative for infiltrate, suggestive of atelectasis Code Status: Full  Family Communication: Updated patient and daughter     Discharge Condition: Stable  Disposition: Skilled nursing facility Follow-up Information   Follow up with [name],[name], MD In 2 weeks.   Specialty:  Orthopedic Surgery   Contact information:   [place] [address] [phone]       Follow up with Rebecca Eaton, MD On 10/15/2012. (at 10:30 AM)    Specialty:  Cardiology   Contact information:   [address] [place] [address] [phone]       Diet: Cardiac Wt Readings from Last 3 Encounters:  09/21/12 51.982 kg (114 lb 9.6 oz)  09/21/12 51.982 kg (114 lb 9.6 oz)  03/18/12 52.663 kg (116 lb 1.6 oz)    History of present illness:  79 y.o. female with Past medical history of osteoporosis, osteoarthritis, compression fracture of lumbar spine, cervical spondylosis with chronic malalignment of the cervical spine, atrial fibrillation on Coumadin, hypertension.  She presented 09/21/12 with the complaint of fall. She was sleeping at the edge of the bed, and she rolled over on the  ground. When she tried to stand she couldn't because of the pain in the left hip and had another fall. After that she asked for help and was brought to the ER. She denied any complaint of chest pain, dizziness, headache, neck pain, focal neurological deficit, nausea,  vomiting, abdominal pain, urinary symptoms, diarrhea, active bleeding. She mentions she was at her baseline when she went to sleep. She mentions there is no change in her medication. She denies any tongue bite or incontinence of bowel or bladder.  X-ray of her left hip revealed acute intertrochanteric left femur fracture. Orthopedics, Dr. Sharol Given was consulted.     Consultants: Ortho-Dr. Sharol Given Cardio--Dr. Hilty  Discharge Exam: Filed Vitals:   09/24/12 0440  BP: 153/55  Pulse: 69  Temp: 98.3 F (36.8 C)  Resp: 20   Filed Vitals:   09/23/12 0438 09/23/12 1449 09/23/12 2126 09/24/12 0440  BP: 138/61 113/89 115/43 153/55  Pulse: 53 68 67 69  Temp: 98.7 F (37.1 C) 98.2 F (36.8 C) 98.8 F (37.1 C) 98.3 F (36.8 C)  TempSrc: Oral Oral Oral Oral  Resp: 16 18 20 20   Height:      Weight:      SpO2: 99% 98% 93% 94%   General: A&O x 3, NAD, pleasant, cooperative Cardiovascular: RRR, no rub, no gallop, no S3 Respiratory: CTAB, no wheeze, no rhonchi Abdomen:soft, nontender, nondistended, positive bowel sounds Extremities: No edema, No lymphangitis, no petechiae  Discharge Instructions      Discharge Orders   Future Appointments Provider Department Dept Phone   03/17/2013 2:30 PM [place] [phone]   03/17/2013 3:00 PM Vivien Rota, NP [facility] [phone]   Future Orders Complete By Expires   Diet - low sodium heart healthy  As directed    Increase activity slowly  As directed    Weight bearing as tolerated  As directed        Medication List    STOP taking these medications       amLODipine 5 MG tablet  Commonly known as:  NORVASC     olmesartan 40 MG tablet  Commonly known as:  BENICAR     olmesartan-hydrochlorothiazide 40-25 MG per tablet  Commonly known as:  BENICAR HCT     triamterene-hydrochlorothiazide 37.5-25 MG per capsule  Commonly known as:  DYAZIDE      warfarin 2.5 MG tablet  Commonly known as:  COUMADIN      TAKE these medications       acetaminophen 325 MG tablet  Commonly known as:  TYLENOL  Take 325 mg by mouth every 4 (four) hours as needed for pain.     alendronate 70 MG tablet  Commonly known as:  FOSAMAX  Take 70 mg by mouth every 7 (seven) days. Take with a full glass of water on an empty stomach.     ALPRAZolam 0.5 MG tablet  Commonly known as:  XANAX  Take 1 tablet (0.5 mg total) by mouth every 8 (eight) hours as needed for anxiety.     amiodarone 200 MG tablet  Commonly known as:  PACERONE  Take 200 mg by mouth every morning.     aspirin EC 81 MG tablet  Take 81 mg by mouth every morning.     atorvastatin 40 MG tablet  Commonly known as:  LIPITOR  Take 40 mg by mouth every evening.     calcium-vitamin D 500-200 MG-UNIT per  tablet  Commonly known as:  OSCAL WITH D  Take 1 tablet by mouth every morning.     feeding supplement Liqd  Take 237 mLs by mouth 2 (two) times daily between meals.     letrozole 2.5 MG tablet  Commonly known as:  FEMARA  Take 2.5 mg by mouth every morning.     metoprolol 100 MG tablet  Commonly known as:  LOPRESSOR  Take 100 mg by mouth 2 (two) times daily.     multivitamin with minerals Tabs tablet  Take 1 tablet by mouth every morning. Centrum 50+     omeprazole 20 MG capsule  Commonly known as:  PRILOSEC  Take 20 mg by mouth daily as needed (for acid reduction).     polyethylene glycol packet  Commonly known as:  MIRALAX / GLYCOLAX  Take 17 g by mouth every morning.     traMADol 50 MG tablet  Commonly known as:  ULTRAM  Take 1 tablet (50 mg total) by mouth every 8 (eight) hours as needed for pain.     Vitamin D (Ergocalciferol) 50000 UNITS Caps capsule  Commonly known as:  DRISDOL  Take 50,000 Units by mouth every 7 (seven) days.         The results of significant diagnostics from this hospitalization (including imaging, microbiology, ancillary and laboratory)  are listed below for reference.    Significant Diagnostic Studies: Dg Chest 1 View  09/21/2012   *RADIOLOGY REPORT*  Clinical Data: Fall with hip pain.  Preoperative chest x-ray.  CHEST - 1 VIEW  Comparison: 07/06/2012.  Findings: Chronic cardiomegaly.  Unchanged upper mediastinal contours.  Hiatal hernia, at least moderate in size.  Aortic atherosclerosis.  Hyperinflated lungs.  No acute infiltrate, edema, effusion, pneumothorax.  Osteopenia.  S-shaped scoliosis.  Two previous lower thoracic vertebral body compression fractures with bone augmentation.  IMPRESSION: 1. No evidence of acute cardiopulmonary disease. 2.  Chronic cardiomegaly. 3.  Probable COPD. 4.  Moderate hiatal hernia.   Original Report Authenticated By: Jorje Guild   Dg Hip Complete Left  09/21/2012   *RADIOLOGY REPORT*  Clinical Data: Fall with hip pain.  LEFT HIP - COMPLETE 2+ VIEW  Comparison: None.  Findings: Intertrochanteric left femur fracture with varus angulation.  The femoral head remains located.  As permitted by overlapping bowel gas, no acute pelvic ring fracture identified.  Severe osteopenia.  Lower lumbar degenerative disc disease.  IMPRESSION: Acute intertrochanteric left femur fracture with varus angulation.   Original Report Authenticated By: Jorje Guild   Dg Chest Port 1 View  09/22/2012   *RADIOLOGY REPORT*  Clinical Data: Leukocytosis.  PORTABLE CHEST - 1 VIEW  Comparison: Portable chest 09/21/2012 and 01/10/2010.  Findings: The patient is rotated on the study.  Small focus airspace opacity is seen in the left lung base.  The right lung is clear.  There is cardiomegaly.  Hiatal hernia is identified.  IMPRESSION:  1.  Small focus of left basilar airspace opacity has an appearance most compatible with atelectasis rather than pneumonia. 2.  Cardiomegaly without edema. 3.  Hiatal hernia.   Original Report Authenticated By: Orlean Patten, M.D.   Dg C-arm 1-60 Min-no Report  09/21/2012   CLINICAL DATA: LEFT HIP  FRACTURE   C-ARM 1-60 MINUTES  Fluoroscopy was utilized by the requesting physician.  No radiographic  interpretation.      Microbiology: Recent Results (from the past 240 hour(s))  SURGICAL PCR SCREEN     Status: None   Collection Time  09/21/12  2:07 PM      Result Value Range Status   MRSA, PCR NEGATIVE  NEGATIVE Final   Staphylococcus aureus NEGATIVE  NEGATIVE Final   Comment:            The Xpert SA Assay (FDA     approved for NASAL specimens     in patients over 27 years of age),     is one component of     a comprehensive surveillance     program.  Test performance has     been validated by [employer] for patients greater     than or equal to 55 year old.     It is not intended     to diagnose infection nor to     guide or monitor treatment.  URINE CULTURE     Status: None   Collection Time    09/22/12 10:12 AM      Result Value Range Status   Specimen Description URINE, CLEAN CATCH   Final   Special Requests NONE   Final   Culture  Setup Time     Final   Value: 09/22/2012 16:14     Performed at [facility]     Final   Value: NO GROWTH     Performed at [facility]   Culture     Final   Value: NO GROWTH     Performed at [facility]   Report Status 09/23/2012 FINAL   Final     Labs: Basic Metabolic Panel:  Recent Labs Lab 09/21/12 0233 09/22/12 0415 09/23/12 0404 09/24/12 0436  NA 134* 128* 133* 131*  K 3.9 4.0 4.1 3.9  CL 98 93* 99 98  CO2 29 26 30 31   GLUCOSE 131* 187* 133* 112*  BUN 14 15 14 14   CREATININE 0.94 0.80 0.83 0.69  CALCIUM 9.0 8.2* 7.9* 7.9*   Liver Function Tests:  Recent Labs Lab 09/22/12 0415 09/23/12 0404 09/24/12 0436  AST 35 43* 41*  ALT 26 22 25   ALKPHOS 45 48 55  BILITOT 0.7 0.9 0.9  PROT 5.2* 5.2* 5.1*  ALBUMIN 2.9* 2.9* 2.6*   No results found for this basename: LIPASE, AMYLASE,  in the last 168 hours No results found for this basename: AMMONIA,  in the last 168  hours CBC:  Recent Labs Lab 09/21/12 0233 09/22/12 0805 09/22/12 0900 09/22/12 1946 09/23/12 0404 09/24/12 0436  WBC 12.8* 11.1* 12.9* 16.3* 13.7* 10.1  NEUTROABS 10.9* 9.8* 11.4*  --   --   --   HGB 11.6* 5.9* 6.3* 10.0* 8.8* 8.6*  HCT 34.1* 17.0* 18.2* 28.0* 24.8* 25.8*  MCV 100.3* 98.8 100.0 94.3 93.6 97.7  PLT 177 128* 132* 126* 104* 123*   Cardiac Enzymes: No results found for this basename: CKTOTAL, CKMB, CKMBINDEX, TROPONINI,  in the last 168 hours BNP: No components found with this basename: POCBNP,  CBG: No results found for this basename: GLUCAP,  in the last 168 hours  Time coordinating discharge:  Greater than 30 minutes  Signed:  Nickolaus Bordelon, DO [facility] Pager: [phone] 09/24/2012, 10:01 AM

## 2012-09-24 NOTE — Anesthesia Postprocedure Evaluation (Signed)
  Anesthesia Post-op Note  Patient: Holly Weaver  Procedure(s) Performed: Procedure(s) (LRB): INTRAMEDULLARY (IM) NAIL FEMORAL (Left)  Patient Location: PACU  Anesthesia Type: General  Level of Consciousness: awake and alert   Airway and Oxygen Therapy: Patient Spontanous Breathing  Post-op Pain: mild  Post-op Assessment: Post-op Vital signs reviewed, Patient's Cardiovascular Status Stable, Respiratory Function Stable, Patent Airway and No signs of Nausea or vomiting  Last Vitals:  Filed Vitals:   09/24/12 1041  BP: 142/49  Pulse: 68  Temp: 36.7 C  Resp: 20    Post-op Vital Signs: stable   Complications: No apparent anesthesia complications

## 2012-09-24 NOTE — Plan of Care (Signed)
Problem: Phase III Progression Outcomes Goal: Anticoagulant follow-up in place Outcome: Not Applicable Date Met:  [id number] Pt on hold with anticoagulants per MD; are following at [facility]

## 2012-09-24 NOTE — Progress Notes (Signed)
Daughter, [name] at bedside. [facility] scheduled for transport (Service Request Id: [id number]).   Winfred Leeds, [facility] Clinical Social Worker cell #: [phone]

## 2012-09-25 ENCOUNTER — Encounter: Payer: Self-pay | Admitting: Internal Medicine

## 2012-09-25 ENCOUNTER — Non-Acute Institutional Stay (SKILLED_NURSING_FACILITY): Payer: Medicare Other | Admitting: Internal Medicine

## 2012-09-25 DIAGNOSIS — S72002S Fracture of unspecified part of neck of left femur, sequela: Secondary | ICD-10-CM

## 2012-09-25 DIAGNOSIS — K219 Gastro-esophageal reflux disease without esophagitis: Secondary | ICD-10-CM | POA: Insufficient documentation

## 2012-09-25 DIAGNOSIS — F411 Generalized anxiety disorder: Secondary | ICD-10-CM | POA: Diagnosis not present

## 2012-09-25 DIAGNOSIS — D62 Acute posthemorrhagic anemia: Secondary | ICD-10-CM | POA: Diagnosis not present

## 2012-09-25 DIAGNOSIS — I4891 Unspecified atrial fibrillation: Secondary | ICD-10-CM

## 2012-09-25 DIAGNOSIS — S72009S Fracture of unspecified part of neck of unspecified femur, sequela: Secondary | ICD-10-CM | POA: Diagnosis not present

## 2012-09-25 DIAGNOSIS — I480 Paroxysmal atrial fibrillation: Secondary | ICD-10-CM

## 2012-09-25 DIAGNOSIS — K5900 Constipation, unspecified: Secondary | ICD-10-CM

## 2012-09-25 DIAGNOSIS — M810 Age-related osteoporosis without current pathological fracture: Secondary | ICD-10-CM

## 2012-09-25 NOTE — Progress Notes (Signed)
Patient ID: Holly Weaver, female   DOB: 02/27/1923, 79 y.o.   MRN: [id number]  Holly Weaver place    PCP: Holly Rouse, MD  Code Status: full code  Allergies  Allergen Reactions  . Codeine Nausea And Vomiting    Chief Complaint: new admit  HPI:  79 y/o female patient is here post mechanical fall and left hip fracture for STR. She was in the hospital and underwent IM nailing by Dr Sharol Given on 09/21/12. Her pain is under control with tramadol. She has been weak and was dizzy this am and had drop in her o2 sat to mid 80s. She was put on oxygen and her o2 sat improved. She also had acute blood loss anemia in hospita and required 2 u prbc. Her coumadin was held there and she also received vitamin k and FFP. Her rate controlling agent for afib were continued. Goal is for her to be able to return home  Review of Systems:  Denies fever or chills Denies nausea or vomiting or abdominal pain Denies chest pain or SOB Denies blurry vision Denies headache Regular bowel movement No urinary complaints  Past Medical History  Diagnosis Date  . Breast cancer   . Hypertension   . Osteoporosis   . Scoliosis   . Atrial fibrillation   . Compression fracture 09/21/2012  . Cervical spondylolysis 09/21/2012  . Osteoarthritis 09/21/2012  . Dyslipidemia 09/21/2012  . MVP (mitral valve prolapse) 09/23/2012   Past Surgical History  Procedure Laterality Date  . Hernia repair    . Femur im nail Left 09/21/2012    Procedure: INTRAMEDULLARY (IM) NAIL FEMORAL;  Surgeon: Newt Minion, MD;  Location: [facility];  Service: Orthopedics;  Laterality: Left;   Social History:   reports that she has never smoked. She does not have any smokeless tobacco history on file. She reports that she does not drink alcohol or use illicit drugs.  Family History  Problem Relation Age of Onset  . Cancer Mother   . CAD Father   . Cancer Other     Medications: Patient's Medications  New Prescriptions   No medications on file   Previous Medications   ACETAMINOPHEN (TYLENOL) 325 MG TABLET    Take 325 mg by mouth every 4 (four) hours as needed for pain.   ALENDRONATE (FOSAMAX) 70 MG TABLET    Take 70 mg by mouth every 7 (seven) days. Take with a full glass of water on an empty stomach.   ALPRAZOLAM (XANAX) 0.5 MG TABLET    Take 1 tablet (0.5 mg total) by mouth every 8 (eight) hours as needed for anxiety.   AMIODARONE (PACERONE) 200 MG TABLET    Take 200 mg by mouth every morning.    ASPIRIN EC 81 MG TABLET    Take 81 mg by mouth every morning.    ATORVASTATIN (LIPITOR) 40 MG TABLET    Take 40 mg by mouth every evening.    CALCIUM-VITAMIN D (OSCAL WITH D) 500-200 MG-UNIT PER TABLET    Take 1 tablet by mouth every morning.    FEEDING SUPPLEMENT (ENSURE COMPLETE) LIQD    Take 237 mLs by mouth 2 (two) times daily between meals.   LETROZOLE (FEMARA) 2.5 MG TABLET    Take 2.5 mg by mouth every morning.   METOPROLOL (LOPRESSOR) 100 MG TABLET    Take 100 mg by mouth 2 (two) times daily.   MULTIPLE VITAMIN (MULTIVITAMIN WITH MINERALS) TABS    Take 1 tablet by mouth every  morning. Centrum 50+   OMEPRAZOLE (PRILOSEC) 20 MG CAPSULE    Take 20 mg by mouth daily as needed (for acid reduction).    POLYETHYLENE GLYCOL (MIRALAX / GLYCOLAX) PACKET    Take 17 g by mouth every morning.    TRAMADOL (ULTRAM) 50 MG TABLET    Take 1 tablet (50 mg total) by mouth every 8 (eight) hours as needed for pain.   VITAMIN D, ERGOCALCIFEROL, (DRISDOL) 50000 UNITS CAPS    Take 50,000 Units by mouth every 7 (seven) days.  Modified Medications   No medications on file  Discontinued Medications   No medications on file     Physical Exam: Filed Vitals:   09/25/12 1355  BP: 130/55  Pulse: 64  Temp: 98.7 F (37.1 C)  Resp: 16  SpO2: 94%   gen- elderly frail lady in NAD HEENT- mild pallor, no icterus, no LAD, MMM, PERRLA, eomi CVS- irregular heart rate, no new murmur, no rubs/ gallop respi- CTAB, no wheeze or rhonchi abdo- bs+, soft, non  tender Ext- able to move all 4, limited mobility in left hip, staples in place on left thigh, dressing clean, incision healing well, left arm ecchymosis and resolving bruise, good radial pulses Neuro- aao x 3, no focal deficit Psych- mood and affect appropriate  Labs reviewed: Basic Metabolic Panel:  Recent Labs  09/22/12 0415 09/23/12 0404 09/24/12 0436  NA 128* 133* 131*  K 4.0 4.1 3.9  CL 93* 99 98  CO2 26 30 31   GLUCOSE 187* 133* 112*  BUN 15 14 14   CREATININE 0.80 0.83 0.69  CALCIUM 8.2* 7.9* 7.9*   Liver Function Tests:  Recent Labs  09/22/12 0415 09/23/12 0404 09/24/12 0436  AST 35 43* 41*  ALT 26 22 25   ALKPHOS 45 48 55  BILITOT 0.7 0.9 0.9  PROT 5.2* 5.2* 5.1*  ALBUMIN 2.9* 2.9* 2.6*   CBC:  Recent Labs  09/21/12 0233 09/22/12 0805 09/22/12 0900 09/22/12 1946 09/23/12 0404 09/24/12 0436  WBC 12.8* 11.1* 12.9* 16.3* 13.7* 10.1  NEUTROABS 10.9* 9.8* 11.4*  --   --   --   HGB 11.6* 5.9* 6.3* 10.0* 8.8* 8.6*  HCT 34.1* 17.0* 18.2* 28.0* 24.8* 25.8*  MCV 100.3* 98.8 100.0 94.3 93.6 97.7  PLT 177 128* 132* 126* 104* 123*    Radiological Exams: Dg Chest 1 View  09/21/2012   *RADIOLOGY REPORT*  Clinical Data: Fall with hip pain.  Preoperative chest x-ray.  CHEST - 1 VIEW  Comparison: 07/06/2012.  Findings: Chronic cardiomegaly.  Unchanged upper mediastinal contours.  Hiatal hernia, at least moderate in size.  Aortic atherosclerosis.  Hyperinflated lungs.  No acute infiltrate, edema, effusion, pneumothorax.  Osteopenia.  S-shaped scoliosis.  Two previous lower thoracic vertebral body compression fractures with bone augmentation.  IMPRESSION: 1. No evidence of acute cardiopulmonary disease. 2.  Chronic cardiomegaly. 3.  Probable COPD. 4.  Moderate hiatal hernia.   Original Report Authenticated By: Jorje Guild   Dg Hip Complete Left  09/21/2012   *RADIOLOGY REPORT*  Clinical Data: Fall with hip pain.  LEFT HIP - COMPLETE 2+ VIEW  Comparison: None.   Findings: Intertrochanteric left femur fracture with varus angulation.  The femoral head remains located.  As permitted by overlapping bowel gas, no acute pelvic ring fracture identified.  Severe osteopenia.  Lower lumbar degenerative disc disease.  IMPRESSION: Acute intertrochanteric left femur fracture with varus angulation.   Original Report Authenticated By: Jorje Guild   Dg Chest [facility]  09/22/2012   *RADIOLOGY REPORT*  Clinical Data: Leukocytosis.  PORTABLE CHEST - 1 VIEW  Comparison: Portable chest 09/21/2012 and 01/10/2010.  Findings: The patient is rotated on the study.  Small focus airspace opacity is seen in the left lung base.  The right lung is clear.  There is cardiomegaly.  Hiatal hernia is identified.  IMPRESSION:  1.  Small focus of left basilar airspace opacity has an appearance most compatible with atelectasis rather than pneumonia. 2.  Cardiomegaly without edema. 3.  Hiatal hernia.   Original Report Authenticated By: Orlean Patten, M.D.       Assessment/Plan  Left hip fracture- s/p IM nailing, staples in place. Wound care to be continued. Continue tramadol for pain. Fall precautions. To work with PT and OT for gait training. To follow with Dr Sharol Given. Continue osteoporosis treatment. On baby aspirin for dvt prophylaxis  Hypoxemia- had in the hospital as well. Normal air entry on lung exam. Recent cxr showed atelectasis. Will have her on o2 for now. Check cbc to assess for bleed. If remains hypoxic, consider ct chest vs V/Q scan to assess further if clinically indicated to rule out PE  afib- rate controlled, continue amiodarone and lopressor. Continue aspirin for now  GERD- continue PPI, monitor cbc  Anemia- blood loss anemia and s/p 2 u prbc transfusion. Check cbc  Osteoporosis- continue fosamax, fall precautions. Also continue ca-vit d   Constipation- will continue miralax for now  Anxiety- continue alprazolam prn   Family/ staff Communication: reviewed care plan  with patient and nursing   Goals of care: to return home   Labs/tests ordered: cbc, cmp

## 2012-09-30 ENCOUNTER — Other Ambulatory Visit: Payer: Self-pay | Admitting: *Deleted

## 2012-09-30 MED ORDER — ALPRAZOLAM 0.25 MG PO TABS: ORAL_TABLET | ORAL | Status: DC

## 2012-10-07 ENCOUNTER — Non-Acute Institutional Stay (SKILLED_NURSING_FACILITY): Payer: Medicare Other | Admitting: Nurse Practitioner

## 2012-10-07 DIAGNOSIS — T889XXS Complication of surgical and medical care, unspecified, sequela: Secondary | ICD-10-CM | POA: Diagnosis not present

## 2012-10-07 DIAGNOSIS — S72009S Fracture of unspecified part of neck of unspecified femur, sequela: Secondary | ICD-10-CM

## 2012-10-07 DIAGNOSIS — T8131XS Disruption of external operation (surgical) wound, not elsewhere classified, sequela: Secondary | ICD-10-CM

## 2012-10-07 DIAGNOSIS — S72002S Fracture of unspecified part of neck of left femur, sequela: Secondary | ICD-10-CM

## 2012-10-08 ENCOUNTER — Inpatient Hospital Stay (HOSPITAL_COMMUNITY)
Admission: EM | Admit: 2012-10-08 | Discharge: 2012-10-14 | DRG: 862 | Disposition: A | Discharge status: Skilled Nursing Facility | Payer: Medicare Other | Attending: Internal Medicine | Admitting: Internal Medicine

## 2012-10-08 ENCOUNTER — Emergency Department (HOSPITAL_COMMUNITY): Payer: Medicare Other

## 2012-10-08 ENCOUNTER — Encounter (HOSPITAL_COMMUNITY): Payer: Self-pay | Admitting: *Deleted

## 2012-10-08 DIAGNOSIS — I480 Paroxysmal atrial fibrillation: Secondary | ICD-10-CM | POA: Diagnosis present

## 2012-10-08 DIAGNOSIS — D649 Anemia, unspecified: Secondary | ICD-10-CM | POA: Diagnosis not present

## 2012-10-08 DIAGNOSIS — D500 Iron deficiency anemia secondary to blood loss (chronic): Secondary | ICD-10-CM | POA: Diagnosis not present

## 2012-10-08 DIAGNOSIS — E43 Unspecified severe protein-calorie malnutrition: Secondary | ICD-10-CM | POA: Diagnosis not present

## 2012-10-08 DIAGNOSIS — E873 Alkalosis: Secondary | ICD-10-CM | POA: Diagnosis not present

## 2012-10-08 DIAGNOSIS — E871 Hypo-osmolality and hyponatremia: Secondary | ICD-10-CM | POA: Diagnosis present

## 2012-10-08 DIAGNOSIS — Z4789 Encounter for other orthopedic aftercare: Secondary | ICD-10-CM | POA: Diagnosis not present

## 2012-10-08 DIAGNOSIS — D72829 Elevated white blood cell count, unspecified: Secondary | ICD-10-CM | POA: Diagnosis present

## 2012-10-08 DIAGNOSIS — I4891 Unspecified atrial fibrillation: Secondary | ICD-10-CM | POA: Diagnosis present

## 2012-10-08 DIAGNOSIS — Z79899 Other long term (current) drug therapy: Secondary | ICD-10-CM

## 2012-10-08 DIAGNOSIS — I4901 Ventricular fibrillation: Secondary | ICD-10-CM | POA: Diagnosis not present

## 2012-10-08 DIAGNOSIS — R0600 Dyspnea, unspecified: Secondary | ICD-10-CM | POA: Diagnosis not present

## 2012-10-08 DIAGNOSIS — R627 Adult failure to thrive: Secondary | ICD-10-CM | POA: Diagnosis present

## 2012-10-08 DIAGNOSIS — A4902 Methicillin resistant Staphylococcus aureus infection, unspecified site: Secondary | ICD-10-CM | POA: Diagnosis present

## 2012-10-08 DIAGNOSIS — I509 Heart failure, unspecified: Secondary | ICD-10-CM | POA: Diagnosis present

## 2012-10-08 DIAGNOSIS — I341 Nonrheumatic mitral (valve) prolapse: Secondary | ICD-10-CM | POA: Diagnosis present

## 2012-10-08 DIAGNOSIS — T8189XA Other complications of procedures, not elsewhere classified, initial encounter: Secondary | ICD-10-CM | POA: Diagnosis not present

## 2012-10-08 DIAGNOSIS — J438 Other emphysema: Secondary | ICD-10-CM | POA: Diagnosis not present

## 2012-10-08 DIAGNOSIS — I10 Essential (primary) hypertension: Secondary | ICD-10-CM | POA: Diagnosis present

## 2012-10-08 DIAGNOSIS — J189 Pneumonia, unspecified organism: Secondary | ICD-10-CM | POA: Diagnosis present

## 2012-10-08 DIAGNOSIS — C50919 Malignant neoplasm of unspecified site of unspecified female breast: Secondary | ICD-10-CM | POA: Diagnosis present

## 2012-10-08 DIAGNOSIS — M810 Age-related osteoporosis without current pathological fracture: Secondary | ICD-10-CM | POA: Diagnosis present

## 2012-10-08 DIAGNOSIS — M1990 Unspecified osteoarthritis, unspecified site: Secondary | ICD-10-CM | POA: Diagnosis present

## 2012-10-08 DIAGNOSIS — L02419 Cutaneous abscess of limb, unspecified: Secondary | ICD-10-CM | POA: Diagnosis present

## 2012-10-08 DIAGNOSIS — N390 Urinary tract infection, site not specified: Secondary | ICD-10-CM | POA: Diagnosis present

## 2012-10-08 DIAGNOSIS — M25559 Pain in unspecified hip: Secondary | ICD-10-CM | POA: Diagnosis not present

## 2012-10-08 DIAGNOSIS — T8140XA Infection following a procedure, unspecified, initial encounter: Principal | ICD-10-CM | POA: Diagnosis present

## 2012-10-08 DIAGNOSIS — L0291 Cutaneous abscess, unspecified: Secondary | ICD-10-CM | POA: Diagnosis present

## 2012-10-08 DIAGNOSIS — I059 Rheumatic mitral valve disease, unspecified: Secondary | ICD-10-CM | POA: Diagnosis present

## 2012-10-08 DIAGNOSIS — Z5189 Encounter for other specified aftercare: Secondary | ICD-10-CM | POA: Diagnosis not present

## 2012-10-08 DIAGNOSIS — R0602 Shortness of breath: Secondary | ICD-10-CM | POA: Diagnosis not present

## 2012-10-08 DIAGNOSIS — E785 Hyperlipidemia, unspecified: Secondary | ICD-10-CM | POA: Diagnosis present

## 2012-10-08 DIAGNOSIS — E46 Unspecified protein-calorie malnutrition: Secondary | ICD-10-CM | POA: Diagnosis present

## 2012-10-08 DIAGNOSIS — Z7982 Long term (current) use of aspirin: Secondary | ICD-10-CM

## 2012-10-08 DIAGNOSIS — I2510 Atherosclerotic heart disease of native coronary artery without angina pectoris: Secondary | ICD-10-CM | POA: Diagnosis present

## 2012-10-08 DIAGNOSIS — S72009A Fracture of unspecified part of neck of unspecified femur, initial encounter for closed fracture: Secondary | ICD-10-CM | POA: Diagnosis not present

## 2012-10-08 DIAGNOSIS — M6281 Muscle weakness (generalized): Secondary | ICD-10-CM | POA: Diagnosis not present

## 2012-10-08 DIAGNOSIS — J90 Pleural effusion, not elsewhere classified: Secondary | ICD-10-CM | POA: Diagnosis present

## 2012-10-08 DIAGNOSIS — S72143A Displaced intertrochanteric fracture of unspecified femur, initial encounter for closed fracture: Secondary | ICD-10-CM | POA: Diagnosis not present

## 2012-10-08 DIAGNOSIS — IMO0002 Reserved for concepts with insufficient information to code with codable children: Secondary | ICD-10-CM | POA: Diagnosis not present

## 2012-10-08 DIAGNOSIS — Y831 Surgical operation with implant of artificial internal device as the cause of abnormal reaction of the patient, or of later complication, without mention of misadventure at the time of the procedure: Secondary | ICD-10-CM | POA: Diagnosis present

## 2012-10-08 HISTORY — DX: Personal history of other medical treatment: Z92.89

## 2012-10-08 HISTORY — DX: Shortness of breath: R06.02

## 2012-10-08 HISTORY — DX: Low back pain, unspecified: M54.50

## 2012-10-08 HISTORY — DX: Low back pain: M54.5

## 2012-10-08 HISTORY — DX: Other chronic pain: G89.29

## 2012-10-08 HISTORY — DX: Cardiac murmur, unspecified: R01.1

## 2012-10-08 HISTORY — DX: Unspecified osteoarthritis, unspecified site: M19.90

## 2012-10-08 LAB — BASIC METABOLIC PANEL
BUN: 18 mg/dL (ref 6–23)
CO2: 31 mEq/L (ref 19–32)
Glucose, Bld: 100 mg/dL — ABNORMAL HIGH (ref 70–99)
Potassium: 3.8 mEq/L (ref 3.5–5.1)
Sodium: 130 mEq/L — ABNORMAL LOW (ref 135–145)

## 2012-10-08 LAB — CBC WITH DIFFERENTIAL/PLATELET
Basophils Relative: 0 % (ref 0–1)
Eosinophils Absolute: 0 10*3/uL (ref 0.0–0.7)
Eosinophils Relative: 1 % (ref 0–5)
Hemoglobin: 11 g/dL — ABNORMAL LOW (ref 12.0–15.0)
Lymphocytes Relative: 15 % (ref 12–46)
Lymphs Abs: 1 10*3/uL (ref 0.7–4.0)
MCH: 34.7 pg — ABNORMAL HIGH (ref 26.0–34.0)
MCV: 101.9 fL — ABNORMAL HIGH (ref 78.0–100.0)
Monocytes Relative: 15 % — ABNORMAL HIGH (ref 3–12)
Neutrophils Relative %: 70 % (ref 43–77)
Platelets: 273 10*3/uL (ref 150–400)
RBC: 3.17 MIL/uL — ABNORMAL LOW (ref 3.87–5.11)
WBC: 7 10*3/uL (ref 4.0–10.5)

## 2012-10-08 LAB — URINE MICROSCOPIC-ADD ON

## 2012-10-08 LAB — URINALYSIS, ROUTINE W REFLEX MICROSCOPIC
Hgb urine dipstick: NEGATIVE
Specific Gravity, Urine: 1.026 (ref 1.005–1.030)
Urobilinogen, UA: 1 mg/dL (ref 0.0–1.0)
pH: 6 (ref 5.0–8.0)

## 2012-10-08 MED ORDER — ATORVASTATIN CALCIUM 40 MG PO TABS
40.0000 mg | ORAL_TABLET | Freq: Every evening | ORAL | Status: DC
  Administered 2012-10-09 – 2012-10-13 (×5): 40 mg via ORAL
  Filled 2012-10-08 (×7): qty 1

## 2012-10-08 MED ORDER — ADULT MULTIVITAMIN W/MINERALS CH
1.0000 | ORAL_TABLET | Freq: Every morning | ORAL | Status: DC
  Administered 2012-10-09 – 2012-10-14 (×6): 1 via ORAL
  Filled 2012-10-08 (×6): qty 1

## 2012-10-08 MED ORDER — SENNOSIDES-DOCUSATE SODIUM 8.6-50 MG PO TABS
1.0000 | ORAL_TABLET | Freq: Two times a day (BID) | ORAL | Status: DC
  Administered 2012-10-09 – 2012-10-14 (×10): 1 via ORAL
  Filled 2012-10-08 (×10): qty 1

## 2012-10-08 MED ORDER — SODIUM CHLORIDE 0.9 % IV BOLUS (SEPSIS)
500.0000 mL | Freq: Once | INTRAVENOUS | Status: AC
  Administered 2012-10-08: 500 mL via INTRAVENOUS

## 2012-10-08 MED ORDER — ENOXAPARIN SODIUM 40 MG/0.4ML ~~LOC~~ SOLN
40.0000 mg | SUBCUTANEOUS | Status: DC
  Administered 2012-10-09 – 2012-10-13 (×6): 40 mg via SUBCUTANEOUS
  Filled 2012-10-08 (×7): qty 0.4

## 2012-10-08 MED ORDER — POLYETHYLENE GLYCOL 3350 17 G PO PACK
17.0000 g | PACK | Freq: Every morning | ORAL | Status: DC
  Administered 2012-10-09 – 2012-10-14 (×4): 17 g via ORAL
  Filled 2012-10-08 (×6): qty 1

## 2012-10-08 MED ORDER — TRAZODONE HCL 50 MG PO TABS
50.0000 mg | ORAL_TABLET | Freq: Two times a day (BID) | ORAL | Status: DC | PRN
  Administered 2012-10-11 – 2012-10-12 (×3): 50 mg via ORAL
  Filled 2012-10-08 (×3): qty 1

## 2012-10-08 MED ORDER — ONDANSETRON HCL 4 MG PO TABS: 4.0000 mg | ORAL_TABLET | Freq: Four times a day (QID) | ORAL | Status: DC | PRN

## 2012-10-08 MED ORDER — AMIODARONE HCL 200 MG PO TABS
200.0000 mg | ORAL_TABLET | Freq: Every morning | ORAL | Status: DC
  Administered 2012-10-09 – 2012-10-14 (×6): 200 mg via ORAL
  Filled 2012-10-08 (×6): qty 1

## 2012-10-08 MED ORDER — ZOLPIDEM TARTRATE 5 MG PO TABS
5.0000 mg | ORAL_TABLET | Freq: Every evening | ORAL | Status: DC | PRN
  Administered 2012-10-09: 5 mg via ORAL
  Filled 2012-10-08: qty 1

## 2012-10-08 MED ORDER — PANTOPRAZOLE SODIUM 40 MG PO TBEC
40.0000 mg | DELAYED_RELEASE_TABLET | Freq: Every day | ORAL | Status: DC
  Administered 2012-10-09 – 2012-10-14 (×6): 40 mg via ORAL
  Filled 2012-10-08 (×6): qty 1

## 2012-10-08 MED ORDER — DEXTROSE 5 % IV SOLN
1.0000 g | Freq: Once | INTRAVENOUS | Status: AC
  Administered 2012-10-08: 1 g via INTRAVENOUS
  Filled 2012-10-08: qty 10

## 2012-10-08 MED ORDER — ACETAMINOPHEN 325 MG PO TABS
325.0000 mg | ORAL_TABLET | ORAL | Status: DC | PRN
  Administered 2012-10-14: 325 mg via ORAL
  Filled 2012-10-08: qty 1

## 2012-10-08 MED ORDER — FERROUS GLUCONATE 324 (38 FE) MG PO TABS
325.0000 mg | ORAL_TABLET | Freq: Two times a day (BID) | ORAL | Status: DC
  Administered 2012-10-09: 324 mg via ORAL
  Administered 2012-10-09 – 2012-10-10 (×3): 325 mg via ORAL
  Administered 2012-10-10: 324 mg via ORAL
  Administered 2012-10-11: 325 mg via ORAL
  Administered 2012-10-11 – 2012-10-12 (×2): 324 mg via ORAL
  Administered 2012-10-12: 325 mg via ORAL
  Administered 2012-10-13: 324 mg via ORAL
  Administered 2012-10-13 – 2012-10-14 (×2): 325 mg via ORAL
  Filled 2012-10-08 (×13): qty 1

## 2012-10-08 MED ORDER — ONDANSETRON HCL 4 MG/2ML IJ SOLN
4.0000 mg | Freq: Four times a day (QID) | INTRAMUSCULAR | Status: DC | PRN
  Administered 2012-10-12: 4 mg via INTRAVENOUS
  Filled 2012-10-08: qty 2

## 2012-10-08 MED ORDER — ALPRAZOLAM 0.25 MG PO TABS
0.2500 mg | ORAL_TABLET | Freq: Two times a day (BID) | ORAL | Status: DC | PRN
  Administered 2012-10-09: 0.25 mg via ORAL
  Filled 2012-10-08: qty 1

## 2012-10-08 MED ORDER — ENSURE COMPLETE PO LIQD
237.0000 mL | Freq: Two times a day (BID) | ORAL | Status: DC
  Administered 2012-10-09 – 2012-10-14 (×9): 237 mL via ORAL

## 2012-10-08 MED ORDER — ONDANSETRON HCL 4 MG/2ML IJ SOLN
4.0000 mg | Freq: Once | INTRAMUSCULAR | Status: AC
Start: 2012-10-08 — End: 2012-10-08
  Administered 2012-10-08: 4 mg via INTRAVENOUS
  Filled 2012-10-08: qty 2

## 2012-10-08 MED ORDER — VANCOMYCIN HCL IN DEXTROSE 750-5 MG/150ML-% IV SOLN
750.0000 mg | INTRAVENOUS | Status: DC
  Administered 2012-10-09 – 2012-10-10 (×3): 750 mg via INTRAVENOUS
  Filled 2012-10-08 (×4): qty 150

## 2012-10-08 MED ORDER — HYDROMORPHONE HCL PF 1 MG/ML IJ SOLN
1.0000 mg | INTRAMUSCULAR | Status: DC | PRN
  Administered 2012-10-08 – 2012-10-13 (×7): 1 mg via INTRAVENOUS
  Filled 2012-10-08 (×7): qty 1

## 2012-10-08 MED ORDER — VITAMIN D (ERGOCALCIFEROL) 1.25 MG (50000 UNIT) PO CAPS
50000.0000 [IU] | ORAL_CAPSULE | ORAL | Status: DC
  Administered 2012-10-13: 50000 [IU] via ORAL
  Filled 2012-10-08: qty 1

## 2012-10-08 MED ORDER — PROMETHAZINE HCL 25 MG PO TABS: 25.0000 mg | ORAL_TABLET | Freq: Four times a day (QID) | ORAL | Status: DC | PRN

## 2012-10-08 MED ORDER — LETROZOLE 2.5 MG PO TABS
2.5000 mg | ORAL_TABLET | Freq: Every morning | ORAL | Status: DC
  Administered 2012-10-09 – 2012-10-14 (×6): 2.5 mg via ORAL
  Filled 2012-10-08 (×6): qty 1

## 2012-10-08 MED ORDER — METOPROLOL TARTRATE 100 MG PO TABS
100.0000 mg | ORAL_TABLET | Freq: Two times a day (BID) | ORAL | Status: DC
  Administered 2012-10-09 – 2012-10-14 (×12): 100 mg via ORAL
  Filled 2012-10-08 (×14): qty 1

## 2012-10-08 MED ORDER — POTASSIUM CHLORIDE IN NACL 20-0.9 MEQ/L-% IV SOLN
INTRAVENOUS | Status: DC
  Administered 2012-10-08 – 2012-10-10 (×3): via INTRAVENOUS
  Filled 2012-10-08 (×4): qty 1000

## 2012-10-08 MED ORDER — MORPHINE SULFATE 4 MG/ML IJ SOLN
4.0000 mg | Freq: Once | INTRAMUSCULAR | Status: AC
  Administered 2012-10-08: 4 mg via INTRAVENOUS
  Filled 2012-10-08: qty 1

## 2012-10-08 NOTE — Progress Notes (Signed)
ANTIBIOTIC CONSULT NOTE - INITIAL  Pharmacy Consult for vancomycin Indication: wound infection (cellulitis vs abscess)  Allergies  Allergen Reactions  . Codeine Nausea And Vomiting    Patient Measurements: Height: 5\' 3"  (160 cm) Weight: 117 lb 8 oz (53.298 kg) IBW/kg (Calculated) : 52.4  Vital Signs: Temp: 98.2 F (36.8 C) (09/17 2315) Temp src: Oral (09/17 2315) BP: 169/61 mmHg (09/17 2315) Pulse Rate: 66 (09/17 2315)  Labs:  Recent Labs  10/08/12 1845  WBC 7.0  HGB 11.0*  PLT 273  CREATININE 0.77   Estimated Creatinine Clearance: 41 ml/min (by C-G formula based on Cr of 0.77).   Microbiology: Recent Results (from the past 720 hour(s))  SURGICAL PCR SCREEN     Status: None   Collection Time    09/21/12  2:07 PM      Result Value Range Status   MRSA, PCR NEGATIVE  NEGATIVE Final   Staphylococcus aureus NEGATIVE  NEGATIVE Final   Comment:            The Xpert SA Assay (FDA     approved for NASAL specimens     in patients over 74 years of age),     is one component of     a comprehensive surveillance     program.  Test performance has     been validated by [employer] for patients greater     than or equal to 21 year old.     It is not intended     to diagnose infection nor to     guide or monitor treatment.  URINE CULTURE     Status: None   Collection Time    09/22/12 10:12 AM      Result Value Range Status   Specimen Description URINE, CLEAN CATCH   Final   Special Requests NONE   Final   Culture  Setup Time     Final   Value: 09/22/2012 16:14     Performed at [facility]     Final   Value: NO GROWTH     Performed at [facility]   Culture     Final   Value: NO GROWTH     Performed at [facility]   Report Status 09/23/2012 FINAL   Final    Medical History: Past Medical History  Diagnosis Date  . Breast cancer   . Hypertension   . Osteoporosis   . Scoliosis   . Atrial fibrillation   .  Compression fracture 09/21/2012  . Cervical spondylolysis 09/21/2012  . Osteoarthritis 09/21/2012  . Dyslipidemia 09/21/2012  . MVP (mitral valve prolapse) 09/23/2012    Medications:  Prescriptions prior to admission  Medication Sig Dispense Refill  . acetaminophen (TYLENOL) 325 MG tablet Take 325 mg by mouth every 4 (four) hours as needed for pain.      Marland Kitchen alendronate (FOSAMAX) 70 MG tablet Take 70 mg by mouth every 7 (seven) days. Take with a full glass of water on an empty stomach.      . ALPRAZolam (XANAX) 0.25 MG tablet Take one tablet by mouth every 12 hours as needed for severe anxiety  60 tablet  5  . amiodarone (PACERONE) 200 MG tablet Take 200 mg by mouth every morning.       Marland Kitchen aspirin EC 81 MG tablet Take 81 mg by mouth every morning.       Marland Kitchen atorvastatin (LIPITOR) 40 MG  tablet Take 40 mg by mouth every evening.       . calcium-vitamin D (OSCAL WITH D) 500-200 MG-UNIT per tablet Take 1 tablet by mouth every morning.       . feeding supplement (ENSURE COMPLETE) LIQD Take 237 mLs by mouth 2 (two) times daily between meals.      . ferrous gluconate (FERGON) 325 MG tablet Take 325 mg by mouth 2 (two) times daily.      Marland Kitchen HYDROcodone-acetaminophen (NORCO) 7.5-325 MG per tablet Take 1 tablet by mouth every 6 (six) hours as needed for pain.      Marland Kitchen letrozole (FEMARA) 2.5 MG tablet Take 2.5 mg by mouth every morning.      Marland Kitchen levofloxacin (LEVAQUIN) 500 MG tablet Take 500 mg by mouth daily.      . metoprolol (LOPRESSOR) 100 MG tablet Take 100 mg by mouth 2 (two) times daily.      . Multiple Vitamin (MULTIVITAMIN WITH MINERALS) TABS Take 1 tablet by mouth every morning. Centrum 50+      . polyethylene glycol (MIRALAX / GLYCOLAX) packet Take 17 g by mouth every morning.       . promethazine (PHENERGAN) 25 MG tablet Take 25 mg by mouth every 6 (six) hours as needed for nausea.      Marland Kitchen senna-docusate (SENOKOT-S) 8.6-50 MG per tablet Take 1 tablet by mouth 2 (two) times daily.      . traMADol (ULTRAM)  50 MG tablet Take 1 tablet (50 mg total) by mouth every 8 (eight) hours as needed for pain.  50 tablet  0  . traZODone (DESYREL) 50 MG tablet Take 50 mg by mouth every 12 (twelve) hours as needed for sleep.      . Vitamin D, Ergocalciferol, (DRISDOL) 50000 UNITS CAPS Take 50,000 Units by mouth every 7 (seven) days.      Marland Kitchen omeprazole (PRILOSEC) 20 MG capsule Take 20 mg by mouth daily as needed (for acid reduction).        Scheduled:  . [START ON 10/09/2012] amiodarone  200 mg Oral q morning - 10a  . [START ON 10/09/2012] atorvastatin  40 mg Oral QPM  . enoxaparin (LOVENOX) injection  40 mg Subcutaneous Q24H  . [START ON 10/09/2012] feeding supplement  237 mL Oral BID BM  . ferrous gluconate  325 mg Oral BID  . [START ON 10/09/2012] letrozole  2.5 mg Oral q morning - 10a  . metoprolol  100 mg Oral BID  . [START ON 10/09/2012] multivitamin with minerals  1 tablet Oral q morning - 10a  . [START ON 10/09/2012] pantoprazole  40 mg Oral Daily  . [START ON 10/09/2012] polyethylene glycol  17 g Oral q morning - 10a  . senna-docusate  1 tablet Oral BID  . [START ON 10/09/2012] vancomycin  750 mg Intravenous Q24H  . [START ON 10/13/2012] Vitamin D (Ergocalciferol)  50,000 Units Oral Q Mon   Infusions:  . 0.9 % NaCl with KCl 20 mEq / L      Assessment: 79yo female sustained fall-->hip fx that required ORIF, was discharged to SNF where she was tx'd w/ ABX for bibasilar airspace dz and UTI, failure to thrive with increasing erythema of wound, concern for cellulitis vs abscess, to begin IV ABX.  Goal of Therapy:  Vancomycin trough 10-20 (will narrow if abscess r/o)  Plan:  Will begin vancomycin 750mg  IV Q24H and monitor CBC, Cx, levels prn.  Wynona Neat, PharmD, BCPS  10/08/2012,11:19 PM

## 2012-10-08 NOTE — ED Notes (Signed)
She is also c/o rt leg and back pain

## 2012-10-08 NOTE — ED Notes (Signed)
The pt reports that her lt hip is infected she reports that she also has pneumonia and she came from [facility] to be admitted for the same

## 2012-10-08 NOTE — ED Notes (Signed)
Pt fell two wks ago and had hip surgery on left hip. Went to [facility] for rehab. Pt developed a fever this am and staff noticed pus from surgical site. Called orthopedist and was told to bring pt to the ER for admission.

## 2012-10-08 NOTE — ED Notes (Signed)
Patient transported to X-ray 

## 2012-10-08 NOTE — H&P (Addendum)
Holly Weaver is an 79 y.o. female.   Chief Complaint: left hip pain HPI:  The patient is an 79 year old Caucasian woman who suffered a fall with left hip fracture on 09/21/2012 leading to open reduction with internal fixation and discharged to a skilled nursing facility on 09/24/2012. She had no immediate complications. Within a short period after arrival at the skilled nursing facility she had a chest x-ray showing bibasilar airspace disease and evidence of a urinary tract infection, and was started on antibiotics for this. Since her arrival to the skilled nursing facility she has had very poor food and fluid intake had low-grade nausea without vomiting, diarrhea, constipation, dysuria, or frequency. She has not had significant reduction of cough or shortness of breath or chest discomfort. She continues to have mild tenderness in the region of the left hip surgery. She was sent to her orthopedic surgeon today for evaluation of erythema at the incision site. Dr. Meridee Score noted that she did have evidence of a surgical site infection, as well as a failure to thrive that needed inpatient evaluation. She was sent to the emergency room for initial laboratory assessment. Labs are notable for leukocytosis with hyponatremia, and imaging studies show basilar airspace disease with minimal bacteriuria. She was started on Rocephin and is admitted for further evaluation.  Past Medical History  Diagnosis Date  . Breast cancer   . Hypertension   . Osteoporosis   . Scoliosis   . Atrial fibrillation   . Compression fracture 09/21/2012  . Cervical spondylolysis 09/21/2012  . Osteoarthritis 09/21/2012  . Dyslipidemia 09/21/2012  . MVP (mitral valve prolapse) 09/23/2012     (Not in a hospital admission)  ADDITIONAL HOME MEDICATIONS: No additional medications  PHYSICIANS INVOLVED IN CARE: Lang Snow (PCP), Meridee Score (ortho)   Past Surgical History  Procedure Laterality Date  . Hernia repair    . Femur  im nail Left 09/21/2012    Procedure: INTRAMEDULLARY (IM) NAIL FEMORAL;  Surgeon: Newt Minion, MD;  Location: [facility];  Service: Orthopedics;  Laterality: Left;    Family History  Problem Relation Age of Onset  . Cancer Mother   . CAD Father   . Cancer Other      Social History:  reports that she has never smoked. She does not have any smokeless tobacco history on file. She reports that she does not drink alcohol or use illicit drugs.  Allergies:  Allergies  Allergen Reactions  . Codeine Nausea And Vomiting     ROS: anemia, arthritis, cancer/tumor, heart murmur, high blood pressure and anorexia, osteoporosis, breast cancer, paroxysmal atrial fibrillation, chronic low back pain, hyperlipidemia  PHYSICAL EXAM: Blood pressure 167/55, pulse 63, temperature 98.4 F (36.9 C), temperature source Oral, resp. rate 16, SpO2 100.00%. In general, the patient is an elderly white woman who was in no apparent distress while lying at 10 elevation head of bed. HEENT exam was within normal limits, neck was supple without jugular venous distention or carotid bruit, chest was clear to auscultation, heart had a regular rate and rhythm with a midsystolic murmur 2/6 intensity to the axilla, abdomen had normal bowel sounds and no hepatosplenomegaly or tenderness, there was some diffuse tympany. Extremities have bilateral trace ankle edema with bilateral 1+ pedal pulses. The left lateral hip region had about a centimeter of erythema on either side of the incision line that was clean, dry, intact and without fluctuance or significant tenderness. She was alert and well oriented with normal affect and able  to move all extremities somewhat.  Results for orders placed during the hospital encounter of 10/08/12 (from the past 48 hour(s))  URINALYSIS, ROUTINE W REFLEX MICROSCOPIC     Status: Abnormal   Collection Time    10/08/12  6:20 PM      Result Value Range   Color, Urine AMBER (*) YELLOW   Comment:  BIOCHEMICALS MAY BE AFFECTED BY COLOR   APPearance HAZY (*) CLEAR   Specific Gravity, Urine 1.026  1.005 - 1.030   pH 6.0  5.0 - 8.0   Glucose, UA NEGATIVE  NEGATIVE mg/dL   Hgb urine dipstick NEGATIVE  NEGATIVE   Bilirubin Urine SMALL (*) NEGATIVE   Ketones, ur 15 (*) NEGATIVE mg/dL   Protein, ur NEGATIVE  NEGATIVE mg/dL   Urobilinogen, UA 1.0  0.0 - 1.0 mg/dL   Nitrite NEGATIVE  NEGATIVE   Leukocytes, UA MODERATE (*) NEGATIVE  URINE MICROSCOPIC-ADD ON     Status: Abnormal   Collection Time    10/08/12  6:20 PM      Result Value Range   Squamous Epithelial / LPF FEW (*) RARE   WBC, UA 7-10  <3 WBC/hpf   RBC / HPF 0-2  <3 RBC/hpf   Bacteria, UA FEW (*) RARE   Casts HYALINE CASTS (*) NEGATIVE   Crystals CA OXALATE CRYSTALS (*) NEGATIVE   Urine-Other MUCOUS PRESENT    CBC WITH DIFFERENTIAL     Status: Abnormal   Collection Time    10/08/12  6:45 PM      Result Value Range   WBC 7.0  4.0 - 10.5 K/uL   RBC 3.17 (*) 3.87 - 5.11 MIL/uL   Hemoglobin 11.0 (*) 12.0 - 15.0 g/dL   HCT 32.3 (*) 36.0 - 46.0 %   MCV 101.9 (*) 78.0 - 100.0 fL   MCH 34.7 (*) 26.0 - 34.0 pg   MCHC 34.1  30.0 - 36.0 g/dL   RDW 18.2 (*) 11.5 - 15.5 %   Platelets 273  150 - 400 K/uL   Neutrophils Relative % 70  43 - 77 %   Neutro Abs 4.9  1.7 - 7.7 K/uL   Lymphocytes Relative 15  12 - 46 %   Lymphs Abs 1.0  0.7 - 4.0 K/uL   Monocytes Relative 15 (*) 3 - 12 %   Monocytes Absolute 1.0  0.1 - 1.0 K/uL   Eosinophils Relative 1  0 - 5 %   Eosinophils Absolute 0.0  0.0 - 0.7 K/uL   Basophils Relative 0  0 - 1 %   Basophils Absolute 0.0  0.0 - 0.1 K/uL  BASIC METABOLIC PANEL     Status: Abnormal   Collection Time    10/08/12  6:45 PM      Result Value Range   Sodium 130 (*) 135 - 145 mEq/L   Potassium 3.8  3.5 - 5.1 mEq/L   Chloride 91 (*) 96 - 112 mEq/L   CO2 31  19 - 32 mEq/L   Glucose, Bld 100 (*) 70 - 99 mg/dL   BUN 18  6 - 23 mg/dL   Creatinine, Ser 0.77  0.50 - 1.10 mg/dL   Calcium 8.3 (*) 8.4 -  10.5 mg/dL   GFR calc non Af Amer 73 (*) >90 mL/min   GFR calc Af Amer 85 (*) >90 mL/min   Comment: (NOTE)     The eGFR has been calculated using the CKD EPI equation.     This calculation has  not been validated in all clinical situations.     eGFR's persistently <90 mL/min signify possible Chronic Kidney     Disease.   Dg Chest 2 View  10/08/2012   *RADIOLOGY REPORT*  Clinical Data: Cough.  Pneumonia.  CHEST - 2 VIEW  Comparison: 09/22/2012  Findings: Hyperexpansion is consistent with emphysema. The cardiopericardial silhouette is enlarged.  There is bibasilar collapse / consolidation with small bilateral pleural effusions, right greater than left. Bones are diffusely demineralized. Multiple thoracolumbar compression fractures are evident.  IMPRESSION: Emphysema with cardiomegaly and bibasilar collapse / consolidation.  Small bilateral pleural effusions.   Original Report Authenticated By: Misty Stanley, M.D.   Dg Hip Complete Left  10/08/2012   *RADIOLOGY REPORT*  Clinical Data: Left hip pain after fall.  LEFT HIP - COMPLETE 2+ VIEW  Comparison: 09/21/2012  Findings: Frontal pelvis shows diffuse osteopenia.  Dynamic hip screw noted in the proximal left femur for intertrochanteric fracture.  SI joints and symphysis pubis are unremarkable.  IMPRESSION: Status post ORIF for left femoral neck fracture.   Original Report Authenticated By: Misty Stanley, M.D.     Assessment/Plan #1 Left Hip Cellulitis: It is not clear as to the extent of her infection. There is no clear fluctuance and she may just have a cellulitis. Alternatively, there could be a pocket of abscess within the wound. Defer further workup to her orthopedist. We will also add coverage for Staphylococcus aureus with IV vancomycin. I will have her NPO after midnight in case I & D is planned for tomorrow AM. #2 Bacteriuria: Initial urinalysis shows minimal bacteriuria. I doubt that she has A significant urinary tract infection. We will check  results of her urine cultures sent in the emergency room. #3 Paroxysmal Atrial Fibrillation: Stable and she appears to be in a sinus rhythm. We will check a 12-lead EKG. #4 Protein Calorie Malnutrition: Clinically appears severe based on no significant food and fluid intake for the past 2 weeks. We will check a complete metabolic panel in the morning as well as a serum prealbumin level as a baseline. We'll continue nutrition supplements and consider adding an appetite stimulant such as Remeron or Oxandrin. #5 Anemia: mild and asymptomatic #6 Hyponatremia: again mild and stable for her.  Kyanne Rials G 10/08/2012, 9:42 PM

## 2012-10-08 NOTE — ED Provider Notes (Signed)
CSN: [id number]     Arrival date & time 10/08/12  1511 History   First MD Initiated Contact with Patient 10/08/12 1717     Chief Complaint  Patient presents with  . multiple complaints    (Consider location/radiation/quality/duration/timing/severity/associated sxs/prior Treatment) HPI  Holly Weaver is a 79 y.o. female with past medical history significant for atrial fibrillation (not on any anticoagulation) and osteoporosis head slip and fall with hip fracture and repair by Dr. Sharol Given approximately 2 and half weeks ago. She has been in [facility] for rehabilitation since that time, recovery has been complicated by urinary tract infection and pneumonia, she also has a decreased appetite. She sent over from rehabilitation facility for evaluation of low grade fever. There is also erythema and discharge from the surgical incision. Dr. Jess Barters group was consulted and the plan is to admit her to the hospitalist under her [facility]. Dr. Sharol Given has spoken with Verl Blalock who has verbally accepted admission, as per the patient's daughter. She denies nausea vomiting, states that the pain at the site of incision is severe, 8/10. He states that she is still having cough and shortness of breath. Denies dysuria, urinary frequency, chest pain, headache. Today is the last day of a 10 day course of Levaquin to cover both pneumonia and urinary tract infection.   Past Medical History  Diagnosis Date  . Breast cancer   . Hypertension   . Osteoporosis   . Scoliosis   . Atrial fibrillation   . Compression fracture 09/21/2012  . Cervical spondylolysis 09/21/2012  . Osteoarthritis 09/21/2012  . Dyslipidemia 09/21/2012  . MVP (mitral valve prolapse) 09/23/2012   Past Surgical History  Procedure Laterality Date  . Hernia repair    . Femur im nail Left 09/21/2012    Procedure: INTRAMEDULLARY (IM) NAIL FEMORAL;  Surgeon: Newt Minion, MD;  Location: [facility];  Service: Orthopedics;  Laterality:  Left;   Family History  Problem Relation Age of Onset  . Cancer Mother   . CAD Father   . Cancer Other    History  Substance Use Topics  . Smoking status: Never Smoker   . Smokeless tobacco: Not on file  . Alcohol Use: No   OB History   Grav Para Term Preterm Abortions TAB SAB Ect Mult Living                 Review of Systems 10 systems reviewed and found to be negative, except as noted in the HPI   Allergies  Codeine  Home Medications   Current Outpatient Rx  Name  Route  Sig  Dispense  Refill  . acetaminophen (TYLENOL) 325 MG tablet   Oral   Take 325 mg by mouth every 4 (four) hours as needed for pain.         Marland Kitchen alendronate (FOSAMAX) 70 MG tablet   Oral   Take 70 mg by mouth every 7 (seven) days. Take with a full glass of water on an empty stomach.         . ALPRAZolam (XANAX) 0.25 MG tablet      Take one tablet by mouth every 12 hours as needed for severe anxiety   60 tablet   5   . ALPRAZolam (XANAX) 0.5 MG tablet   Oral   Take 1 tablet (0.5 mg total) by mouth every 8 (eight) hours as needed for anxiety.   30 tablet   0   . amiodarone (PACERONE) 200 MG tablet  Oral   Take 200 mg by mouth every morning.          Marland Kitchen aspirin EC 81 MG tablet   Oral   Take 81 mg by mouth every morning.          Marland Kitchen atorvastatin (LIPITOR) 40 MG tablet   Oral   Take 40 mg by mouth every evening.          . calcium-vitamin D (OSCAL WITH D) 500-200 MG-UNIT per tablet   Oral   Take 1 tablet by mouth every morning.          . feeding supplement (ENSURE COMPLETE) LIQD   Oral   Take 237 mLs by mouth 2 (two) times daily between meals.         Marland Kitchen letrozole (FEMARA) 2.5 MG tablet   Oral   Take 2.5 mg by mouth every morning.         . metoprolol (LOPRESSOR) 100 MG tablet   Oral   Take 100 mg by mouth 2 (two) times daily.         . Multiple Vitamin (MULTIVITAMIN WITH MINERALS) TABS   Oral   Take 1 tablet by mouth every morning. Centrum 50+         .  omeprazole (PRILOSEC) 20 MG capsule   Oral   Take 20 mg by mouth daily as needed (for acid reduction).          . polyethylene glycol (MIRALAX / GLYCOLAX) packet   Oral   Take 17 g by mouth every morning.          . traMADol (ULTRAM) 50 MG tablet   Oral   Take 1 tablet (50 mg total) by mouth every 8 (eight) hours as needed for pain.   50 tablet   0   . Vitamin D, Ergocalciferol, (DRISDOL) 50000 UNITS CAPS   Oral   Take 50,000 Units by mouth every 7 (seven) days.          BP 147/55  Pulse 62  Temp(Src) 98.3 F (36.8 C)  Resp 18  SpO2 100% Physical Exam  Nursing note and vitals reviewed. Constitutional: She is oriented to person, place, and time. She appears well-developed and well-nourished. No distress.  Frail, nonseptic appearing  HENT:  Head: Normocephalic.  Mouth/Throat: Oropharynx is clear and moist.  Eyes: Conjunctivae and EOM are normal. Pupils are equal, round, and reactive to light.  Cardiovascular: Normal rate, regular rhythm and intact distal pulses.   Pulmonary/Chest: Effort normal and breath sounds normal. No stridor. No respiratory distress. She has no wheezes. She exhibits no tenderness.  Abdominal: Soft. Bowel sounds are normal. She exhibits no mass. There is no tenderness. There is no rebound and no guarding.  Musculoskeletal: Normal range of motion.  Good range of motion to left hip  Neurological: She is alert and oriented to person, place, and time.  Skin:  6 cm surgical incision to left hip, there is 1 cm of surrounding erythema and induration, there is a scant amount of foul-smelling purulent discharge. +TTP  Psychiatric: She has a normal mood and affect.    ED Course  Procedures (including critical care time) Labs Review Labs Reviewed  URINALYSIS, ROUTINE W REFLEX MICROSCOPIC - Abnormal; Notable for the following:    Color, Urine AMBER (*)    APPearance HAZY (*)    Bilirubin Urine SMALL (*)    Ketones, ur 15 (*)    Leukocytes, UA  MODERATE (*)    All  other components within normal limits  CBC WITH DIFFERENTIAL - Abnormal; Notable for the following:    RBC 3.17 (*)    Hemoglobin 11.0 (*)    HCT 32.3 (*)    MCV 101.9 (*)    MCH 34.7 (*)    RDW 18.2 (*)    Monocytes Relative 15 (*)    All other components within normal limits  BASIC METABOLIC PANEL - Abnormal; Notable for the following:    Sodium 130 (*)    Chloride 91 (*)    Glucose, Bld 100 (*)    Calcium 8.3 (*)    GFR calc non Af Amer 73 (*)    GFR calc Af Amer 85 (*)    All other components within normal limits  URINE MICROSCOPIC-ADD ON - Abnormal; Notable for the following:    Squamous Epithelial / LPF FEW (*)    Bacteria, UA FEW (*)    Casts HYALINE CASTS (*)    Crystals CA OXALATE CRYSTALS (*)    All other components within normal limits  URINE CULTURE   Imaging Review Dg Chest 2 View  10/08/2012   *RADIOLOGY REPORT*  Clinical Data: Cough.  Pneumonia.  CHEST - 2 VIEW  Comparison: 09/22/2012  Findings: Hyperexpansion is consistent with emphysema. The cardiopericardial silhouette is enlarged.  There is bibasilar collapse / consolidation with small bilateral pleural effusions, right greater than left. Bones are diffusely demineralized. Multiple thoracolumbar compression fractures are evident.  IMPRESSION: Emphysema with cardiomegaly and bibasilar collapse / consolidation.  Small bilateral pleural effusions.   Original Report Authenticated By: Misty Stanley, M.D.   Dg Hip Complete Left  10/08/2012   *RADIOLOGY REPORT*  Clinical Data: Left hip pain after fall.  LEFT HIP - COMPLETE 2+ VIEW  Comparison: 09/21/2012  Findings: Frontal pelvis shows diffuse osteopenia.  Dynamic hip screw noted in the proximal left femur for intertrochanteric fracture.  SI joints and symphysis pubis are unremarkable.  IMPRESSION: Status post ORIF for left femoral neck fracture.   Original Report Authenticated By: Misty Stanley, M.D.    MDM   1. Surgical wound infection, initial  encounter   2. Urinary tract infection   3. Adult failure to thrive     Filed Vitals:   10/08/12 1800 10/08/12 1815 10/08/12 1830 10/08/12 1845  BP: 152/53 158/51 164/63 164/64  Pulse: 62 61 62 62  Temp:      Resp:      SpO2: 100% 100% 100% 100%     FAIGY HIBBEN is a 79 y.o. female status post left hip ORIF by Dr. Sharol Given approximately 2 and half weeks ago. Patient has been in a nursing home and has had several complications including pneumonia and urinary tract infection with failure to thrive and decreased by mouth intake. Patient appears clinically dehydrated, the surgical incision is erythematous and has purulent drainage. Wound culture obtained the patient will be started on Rocephin to cover both the incision site infection and UTI.  Patient will be [facility] to [facility], Dr. Sharlett Iles to see the patient and putting holding orders. I have also consult at [facility] who are aware that she is in the hospital and will come to evaluate her.   Medications  morphine 4 MG/ML injection 4 mg (4 mg Intravenous Given 10/08/12 1847)  ondansetron (ZOFRAN) injection 4 mg (4 mg Intravenous Given 10/08/12 1847)  sodium chloride 0.9 % bolus 500 mL (500 mLs Intravenous New Bag/Given 10/08/12 1849)    Note: Portions of this report may  have been transcribed using voice recognition software. Every effort was made to ensure accuracy; however, inadvertent computerized transcription errors may be present      Monico Blitz, PA-C 10/08/12 2041

## 2012-10-09 ENCOUNTER — Encounter (HOSPITAL_COMMUNITY): Payer: Self-pay | Admitting: General Practice

## 2012-10-09 DIAGNOSIS — S72143A Displaced intertrochanteric fracture of unspecified femur, initial encounter for closed fracture: Secondary | ICD-10-CM | POA: Diagnosis not present

## 2012-10-09 DIAGNOSIS — I4891 Unspecified atrial fibrillation: Secondary | ICD-10-CM | POA: Diagnosis not present

## 2012-10-09 DIAGNOSIS — I10 Essential (primary) hypertension: Secondary | ICD-10-CM | POA: Diagnosis not present

## 2012-10-09 DIAGNOSIS — D500 Iron deficiency anemia secondary to blood loss (chronic): Secondary | ICD-10-CM | POA: Diagnosis not present

## 2012-10-09 LAB — PREALBUMIN: Prealbumin: 11.9 mg/dL — ABNORMAL LOW (ref 17.0–34.0)

## 2012-10-09 LAB — COMPREHENSIVE METABOLIC PANEL
ALT: 44 U/L — ABNORMAL HIGH (ref 0–35)
AST: 49 U/L — ABNORMAL HIGH (ref 0–37)
CO2: 31 mEq/L (ref 19–32)
Calcium: 8.2 mg/dL — ABNORMAL LOW (ref 8.4–10.5)
Chloride: 96 mEq/L (ref 96–112)
GFR calc non Af Amer: 74 mL/min — ABNORMAL LOW (ref >90)
Potassium: 3.5 mEq/L (ref 3.5–5.1)
Sodium: 132 mEq/L — ABNORMAL LOW (ref 135–145)
Total Bilirubin: 0.9 mg/dL (ref 0.3–1.2)

## 2012-10-09 LAB — CBC
Hemoglobin: 10.6 g/dL — ABNORMAL LOW (ref 12.0–15.0)
MCH: 34 pg (ref 26.0–34.0)
MCHC: 32.9 g/dL (ref 30.0–36.0)

## 2012-10-09 MED ORDER — MIRTAZAPINE 15 MG PO TABS
15.0000 mg | ORAL_TABLET | Freq: Every day | ORAL | Status: DC
  Administered 2012-10-09 – 2012-10-13 (×5): 15 mg via ORAL
  Filled 2012-10-09 (×6): qty 1

## 2012-10-09 MED ORDER — DEXTROSE 5 % IV SOLN
1.0000 g | INTRAVENOUS | Status: DC
  Administered 2012-10-09 – 2012-10-13 (×5): 1 g via INTRAVENOUS
  Filled 2012-10-09 (×6): qty 10

## 2012-10-09 MED ORDER — ASPIRIN EC 81 MG PO TBEC
81.0000 mg | DELAYED_RELEASE_TABLET | Freq: Every day | ORAL | Status: DC
  Administered 2012-10-09 – 2012-10-14 (×6): 81 mg via ORAL
  Filled 2012-10-09 (×6): qty 1

## 2012-10-09 MED ORDER — ALBUTEROL SULFATE (5 MG/ML) 0.5% IN NEBU: 2.5000 mg | INHALATION_SOLUTION | RESPIRATORY_TRACT | Status: DC | PRN

## 2012-10-09 NOTE — Progress Notes (Signed)
Clinical Social Work Department BRIEF PSYCHOSOCIAL ASSESSMENT 10/09/2012  Patient:  Holly Weaver, Holly Weaver     Account Number:  [id number]     Admit date:  10/08/2012  Clinical Social Worker:  Pete Pelt, [place]  Date/Time:  10/09/2012 01:13 PM  Referred by:  Physician  Date Referred:  10/09/2012 Referred for  SNF Placement   Other Referral:   Interview type:  Patient Other interview type:    PSYCHOSOCIAL DATA Living Status:  FACILITY Admitted from facility:  [facility] Level of care:  [place] Primary support name:  [name]    [id number] Primary support relationship to patient:  CHILD, ADULT Degree of support available:   Pt has good support from daughter and facility    CURRENT CONCERNS Current Concerns  Post-Acute Placement   Other Concerns:    SOCIAL WORK ASSESSMENT / PLAN CSW and BSW student Myioshi Lennette Bihari  spoke with the Pt at the bedside to discuss d/c planning back to [facility] at time of discharge. Pt stated that she was at the facility for rehab and that she was unable to participate as well because she had increasing symptoms of weakness and inability to eat. Pt was brought back to hospital for furhter evaluation. Pt is agreeable to return to facility if necessary to complete her rehab. CSW spoke with CM to discuss PT/OT re-eval for appropriatness of retrun to prior facility.  CSW to follow for d/c planning.   Assessment/plan status:  Information/Referral to [employer] Other assessment/ plan:   Information/referral to community resources:   No additional information needed at this time.    PATIENT'S/FAMILY'S RESPONSE TO PLAN OF CARE: Pt was appreciative for assistance with d/c planning.       Pete Pelt, [facility]  [id number]

## 2012-10-09 NOTE — Progress Notes (Signed)
CSW met with Pt. Assessment to follow.   Holly Weaver, [facility]  [id number]

## 2012-10-09 NOTE — ED Provider Notes (Signed)
Medical screening examination/treatment/procedure(s) were conducted as a shared visit with non-physician practitioner(s) and myself.  I personally evaluated the patient during the encounter Patient presents from a skilled nursing facility status post a left hip fracture repair by Dr. Sharol Given. She has evidence of wound cellulitis. She also has evidence of a urinary tract infection. She will be admitted by Dr. Sharlett Iles with Dr. Sharol Given consulting.  Malvin Johns, MD 10/09/12 2352

## 2012-10-09 NOTE — Progress Notes (Signed)
INITIAL NUTRITION ASSESSMENT  DOCUMENTATION CODES Per approved criteria  -Not Applicable   INTERVENTION: Diet advancement per MD discretion Provide Ensure Complete BID once diet advanced Encourage PO intake  NUTRITION DIAGNOSIS: Inadequate oral intake related to poor appetite as evidenced by pt's report of eating very little for the past few weeks.   Goal: Pt to meet >/= 90% of their estimated nutrition needs   Monitor:  Diet advancement/PO intake Weight Labs  Reason for Assessment: Malnutrition Screening Tool, score of 2  79 y.o. female  Admitting Dx: Cellulitis and abscess  ASSESSMENT: 79 year old Caucasian woman who suffered a fall with left hip fracture on 09/21/2012 leading to open reduction with internal fixation and discharged to a skilled nursing facility on 09/24/2012. She had no immediate complications. Within a short period after arrival at the skilled nursing facility she had a chest x-ray showing bibasilar airspace disease and evidence of a urinary tract infection, and was started on antibiotics for this.Since her arrival to the skilled nursing facility she has had very poor food and fluid intake had low-grade nausea  Pt reports feeling better after receiving fluids and expresses desire to eat. Pt currently NPO. Pt reports that she has had a very poor appetite for the past few weeks and doesn't remember eating much of anything at the nursing facility. RD met with pt at previous admission- pt reported usual body weight is 120 lbs (one year ago).   Height: Ht Readings from Last 1 Encounters:  10/08/12 5\' 3"  (1.6 m)    Weight: Wt Readings from Last 1 Encounters:  10/09/12 117 lb 8.1 oz (53.3 kg)    Ideal Body Weight: 115 lbs  % Ideal Body Weight: 101%  Wt Readings from Last 10 Encounters:  10/09/12 117 lb 8.1 oz (53.3 kg)  09/21/12 114 lb 9.6 oz (51.982 kg)  09/21/12 114 lb 9.6 oz (51.982 kg)  03/18/12 116 lb 1.6 oz (52.663 kg)  09/13/11 120 lb 8 oz  (54.658 kg)  03/15/11 119 lb 11.2 oz (54.296 kg)    Usual Body Weight: 120 lbs  % Usual Body Weight: 97.5%  BMI:  Body mass index is 20.82 kg/(m^2).  Estimated Nutritional Needs: Kcal: 1300-1500 Protein: 55-65 grams Fluid: 1.3-1.5 L/day  Skin: wound on left hip; +1 RLE edema, non-pitting LLE edema  Diet Order: NPO  EDUCATION NEEDS: -No education needs identified at this time   Intake/Output Summary (Last 24 hours) at 10/09/12 1255 Last data filed at 10/09/12 1200  Gross per 24 hour  Intake      0 ml  Output    700 ml  Net   -700 ml    Last BM: 9/16  Labs:   Recent Labs Lab 10/08/12 1845 10/09/12 0425  NA 130* 132*  K 3.8 3.5  CL 91* 96  CO2 31 31  BUN 18 12  CREATININE 0.77 0.76  CALCIUM 8.3* 8.2*  GLUCOSE 100* 85    CBG (last 3)  No results found for this basename: GLUCAP,  in the last 72 hours  Scheduled Meds: . amiodarone  200 mg Oral q morning - 10a  . aspirin EC  81 mg Oral Daily  . atorvastatin  40 mg Oral QPM  . cefTRIAXone (ROCEPHIN)  IV  1 g Intravenous Q24H  . enoxaparin (LOVENOX) injection  40 mg Subcutaneous Q24H  . feeding supplement  237 mL Oral BID BM  . ferrous gluconate  325 mg Oral BID  . letrozole  2.5 mg Oral q morning -  10a  . metoprolol  100 mg Oral BID  . mirtazapine  15 mg Oral QHS  . multivitamin with minerals  1 tablet Oral q morning - 10a  . pantoprazole  40 mg Oral Daily  . polyethylene glycol  17 g Oral q morning - 10a  . senna-docusate  1 tablet Oral BID  . vancomycin  750 mg Intravenous Q24H  . [START ON 10/13/2012] Vitamin D (Ergocalciferol)  50,000 Units Oral Q Mon    Continuous Infusions: . 0.9 % NaCl with KCl 20 mEq / L 75 mL/hr at 10/08/12 2349    Past Medical History  Diagnosis Date  . Breast cancer   . Hypertension   . Osteoporosis   . Scoliosis   . Atrial fibrillation   . Compression fracture 09/21/2012  . Cervical spondylolysis 09/21/2012  . Osteoarthritis 09/21/2012  . Dyslipidemia 09/21/2012  .  MVP (mitral valve prolapse) 09/23/2012    Past Surgical History  Procedure Laterality Date  . Hernia repair    . Femur im nail Left 09/21/2012    Procedure: INTRAMEDULLARY (IM) NAIL FEMORAL;  Surgeon: Newt Minion, MD;  Location: [facility];  Service: Orthopedics;  Laterality: Left;    Pryor Ochoa RD, LDN Inpatient Clinical Dietitian Pager: [phone] After Hours Pager: [phone]

## 2012-10-09 NOTE — Progress Notes (Signed)
Pt. Arrived to unit alert and in stable condition. No s/s of distress noted. Pt. Denies pain at this time. Pt. Oriented to room and situated in bed. Call light placed in reach. Daughter at bedside. RN will continue to monitor pt. For changes in condition. Holly Weaver, Katherine Roan

## 2012-10-09 NOTE — Progress Notes (Signed)
Subjective: Recent history reviewed with patient.  Has not been progressing well at SNF with poor appetite, recent cough (?pneumonia), and now increased left hip incisional redness and drainage.  Admitted last pm for management.  Objective: Vital signs in last 24 hours: Temp:  [97.9 F (36.6 C)-98.4 F (36.9 C)] 97.9 F (36.6 C) (09/18 0500) Pulse Rate:  [60-66] 65 (09/18 0500) Resp:  [16-18] 18 (09/18 0500) BP: (143-176)/(51-79) 158/53 mmHg (09/18 0500) SpO2:  [95 %-100 %] 95 % (09/18 0500) Weight:  [53.298 kg (117 lb 8 oz)-53.3 kg (117 lb 8.1 oz)] 53.3 kg (117 lb 8.1 oz) (09/18 0500) Weight change:  Last BM Date: 10/07/12  CBG (last 3)  No results found for this basename: GLUCAP,  in the last 72 hours  Intake/Output from previous day: 09/17 0701 - 09/18 0700 In: -  Out: 400 [Urine:400] Intake/Output this shift:    General appearance: alert and no distress Eyes: no scleral icterus Throat: oropharynx moist without erythema Resp: clear to auscultation bilaterally Cardio: regular rate and rhythm and grade 3/6 SEM GI: soft, non-tender; bowel sounds normal; no masses,  no organomegaly Extremities: no clubbing, cyanosis or edema; left hip ecchymosis; incisional erythema with small amount of thick serous drainage   Lab Results:  Recent Labs  10/08/12 1845 10/09/12 0425  NA 130* 132*  K 3.8 3.5  CL 91* 96  CO2 31 31  GLUCOSE 100* 85  BUN 18 12  CREATININE 0.77 0.76  CALCIUM 8.3* 8.2*    Recent Labs  10/09/12 0425  AST 49*  ALT 44*  ALKPHOS 145*  BILITOT 0.9  PROT 5.3*  ALBUMIN 2.8*    Recent Labs  10/08/12 1845 10/09/12 0425  WBC 7.0 4.8  NEUTROABS 4.9  --   HGB 11.0* 10.6*  HCT 32.3* 32.2*  MCV 101.9* 103.2*  PLT 273 275   Lab Results  Component Value Date   INR 1.07 09/23/2012   INR 1.20 09/22/2012   INR 2.02* 09/21/2012    Studies/Results: Dg Chest 2 View  10/08/2012   *RADIOLOGY REPORT*  Clinical Data: Cough.  Pneumonia.  CHEST - 2 VIEW   Comparison: 09/22/2012  Findings: Hyperexpansion is consistent with emphysema. The cardiopericardial silhouette is enlarged.  There is bibasilar collapse / consolidation with small bilateral pleural effusions, right greater than left. Bones are diffusely demineralized. Multiple thoracolumbar compression fractures are evident.  IMPRESSION: Emphysema with cardiomegaly and bibasilar collapse / consolidation.  Small bilateral pleural effusions.   Original Report Authenticated By: Misty Stanley, M.D.   Dg Hip Complete Left  10/08/2012   *RADIOLOGY REPORT*  Clinical Data: Left hip pain after fall.  LEFT HIP - COMPLETE 2+ VIEW  Comparison: 09/21/2012  Findings: Frontal pelvis shows diffuse osteopenia.  Dynamic hip screw noted in the proximal left femur for intertrochanteric fracture.  SI joints and symphysis pubis are unremarkable.  IMPRESSION: Status post ORIF for left femoral neck fracture.   Original Report Authenticated By: Misty Stanley, M.D.     Medications: Scheduled: . amiodarone  200 mg Oral q morning - 10a  . atorvastatin  40 mg Oral QPM  . enoxaparin (LOVENOX) injection  40 mg Subcutaneous Q24H  . feeding supplement  237 mL Oral BID BM  . ferrous gluconate  325 mg Oral BID  . letrozole  2.5 mg Oral q morning - 10a  . metoprolol  100 mg Oral BID  . multivitamin with minerals  1 tablet Oral q morning - 10a  . pantoprazole  40 mg  Oral Daily  . polyethylene glycol  17 g Oral q morning - 10a  . senna-docusate  1 tablet Oral BID  . vancomycin  750 mg Intravenous Q24H  . [START ON 10/13/2012] Vitamin D (Ergocalciferol)  50,000 Units Oral Q Mon   Continuous: . 0.9 % NaCl with KCl 20 mEq / L 75 mL/hr at 10/08/12 2349    Assessment/Plan: Principal Problem: 1. Cellulitis and abscess- will continue Vancomycin and Rocephin pending wound culture data.  Further management (?I&D) per Ortho.  Will place PICC line soon to allow 7-14 days of IV antbiotics.  Active Problems: 2. Failure to thrive- poor po  intake and generalized weakness are contributing.  Will add Remeron to stimulate appetite, mood and continue PT/OT.  No evidence of pneumonia- xray consistent with atelectasis.  Will add IS. 3. Protein calorie malnutrition- poor po intake.  Prealbumin pending.  Will add Remeron and continue Ensure shakes. 4. PAF (paroxysmal atrial fibrillation), maintaining SR- continue Amiodarone though it may be contributing to fatigue/poor appetite.  Check TSH.  Cardiology stopped Coumadin recently due to fall risk.  Consider Eliquis as an alternative given recent data comparing Eliquis to Aspirin. Add ASA. 5. Hypertension- adequate control on Metoprolol. 6. CAD in native artery, non obstructive by cath 2000- continue medical therapy.  Add Aspirin 7. Disposition- anticipate discharge back to SNF on Monday- will place PICC line tomorrow if stable.  Activity per Ortho but will order PT/OT, SW consults to continue therapy. 8. DVT prophylaxis- on Lovenox.    LOS: 1 day   Kennedy Bohanon,W DOUGLAS 10/09/2012, 7:28 AM

## 2012-10-10 ENCOUNTER — Inpatient Hospital Stay (HOSPITAL_COMMUNITY): Payer: Medicare Other

## 2012-10-10 DIAGNOSIS — D500 Iron deficiency anemia secondary to blood loss (chronic): Secondary | ICD-10-CM | POA: Diagnosis not present

## 2012-10-10 DIAGNOSIS — J90 Pleural effusion, not elsewhere classified: Secondary | ICD-10-CM | POA: Diagnosis not present

## 2012-10-10 DIAGNOSIS — I10 Essential (primary) hypertension: Secondary | ICD-10-CM | POA: Diagnosis not present

## 2012-10-10 DIAGNOSIS — S72143A Displaced intertrochanteric fracture of unspecified femur, initial encounter for closed fracture: Secondary | ICD-10-CM | POA: Diagnosis not present

## 2012-10-10 DIAGNOSIS — I4891 Unspecified atrial fibrillation: Secondary | ICD-10-CM | POA: Diagnosis not present

## 2012-10-10 MED ORDER — FUROSEMIDE 10 MG/ML IJ SOLN
40.0000 mg | Freq: Once | INTRAMUSCULAR | Status: AC
  Administered 2012-10-10: 40 mg via INTRAVENOUS
  Filled 2012-10-10 (×2): qty 4

## 2012-10-10 MED ORDER — POTASSIUM CHLORIDE CRYS ER 20 MEQ PO TBCR
40.0000 meq | EXTENDED_RELEASE_TABLET | Freq: Once | ORAL | Status: AC
  Administered 2012-10-10: 40 meq via ORAL
  Filled 2012-10-10: qty 2

## 2012-10-10 MED ORDER — FUROSEMIDE 10 MG/ML IJ SOLN
40.0000 mg | Freq: Two times a day (BID) | INTRAMUSCULAR | Status: DC
  Administered 2012-10-10 – 2012-10-12 (×5): 40 mg via INTRAVENOUS
  Filled 2012-10-10 (×8): qty 4

## 2012-10-10 MED ORDER — IOHEXOL 350 MG/ML SOLN
80.0000 mL | Freq: Once | INTRAVENOUS | Status: AC | PRN
  Administered 2012-10-10: 80 mL via INTRAVENOUS

## 2012-10-10 NOTE — Progress Notes (Signed)
[facility] called and informed this RN that pts hip culture is positive for MRSA

## 2012-10-10 NOTE — Progress Notes (Addendum)
Subjective: Reports increased shortness of breath overnight.  Mild cough.  Mouth is dry.  Complains of feeling poorly.  Hip pain is controlled.    Objective: Vital signs in last 24 hours: Temp:  [98.1 F (36.7 C)-98.3 F (36.8 C)] 98.2 F (36.8 C) (09/19 0603) Pulse Rate:  [62-67] 62 (09/19 0603) Resp:  [18-20] 20 (09/19 0603) BP: (151-170)/(45-59) 155/45 mmHg (09/19 0603) SpO2:  [95 %-97 %] 97 % (09/19 0603) Weight change:  Last BM Date: 10/09/12  CBG (last 3)  No results found for this basename: GLUCAP,  in the last 72 hours  Intake/Output from previous day: 09/18 0701 - 09/19 0700 In: 600 [I.V.:600] Out: 600 [Urine:600] Intake/Output this shift:    General appearance: alert and with mild tachypnea Eyes: no scleral icterus Throat: oropharynx moist without erythema Resp: crackles in bilateral bases and expiratory wheezing throughout Cardio: regular rate and rhythm with grade 2/6 systolic murmur over apex GI: soft, non-tender; bowel sounds normal; no masses,  no organomegaly Extremities: no clubbing, cyanosis or edema; left lateral hip incision with decreased erythema but persistent thick serous drainage   Lab Results:  Recent Labs  10/08/12 1845 10/09/12 0425  NA 130* 132*  K 3.8 3.5  CL 91* 96  CO2 31 31  GLUCOSE 100* 85  BUN 18 12  CREATININE 0.77 0.76  CALCIUM 8.3* 8.2*    Recent Labs  10/09/12 0425  AST 49*  ALT 44*  ALKPHOS 145*  BILITOT 0.9  PROT 5.3*  ALBUMIN 2.8*    Recent Labs  10/08/12 1845 10/09/12 0425  WBC 7.0 4.8  NEUTROABS 4.9  --   HGB 11.0* 10.6*  HCT 32.3* 32.2*  MCV 101.9* 103.2*  PLT 273 275   Lab Results  Component Value Date   INR 1.07 09/23/2012   INR 1.20 09/22/2012   INR 2.02* 09/21/2012   No results found for this basename: CKTOTAL, CKMB, CKMBINDEX, TROPONINI,  in the last 72 hours  Recent Labs  10/09/12 0835  TSH 2.727   No results found for this basename: VITAMINB12, FOLATE, FERRITIN, TIBC, IRON, RETICCTPCT,   in the last 72 hours  Studies/Results: Dg Chest 2 View  10/08/2012   *RADIOLOGY REPORT*  Clinical Data: Cough.  Pneumonia.  CHEST - 2 VIEW  Comparison: 09/22/2012  Findings: Hyperexpansion is consistent with emphysema. The cardiopericardial silhouette is enlarged.  There is bibasilar collapse / consolidation with small bilateral pleural effusions, right greater than left. Bones are diffusely demineralized. Multiple thoracolumbar compression fractures are evident.  IMPRESSION: Emphysema with cardiomegaly and bibasilar collapse / consolidation.  Small bilateral pleural effusions.   Original Report Authenticated By: Misty Stanley, M.D.   Dg Hip Complete Left  10/08/2012   *RADIOLOGY REPORT*  Clinical Data: Left hip pain after fall.  LEFT HIP - COMPLETE 2+ VIEW  Comparison: 09/21/2012  Findings: Frontal pelvis shows diffuse osteopenia.  Dynamic hip screw noted in the proximal left femur for intertrochanteric fracture.  SI joints and symphysis pubis are unremarkable.  IMPRESSION: Status post ORIF for left femoral neck fracture.   Original Report Authenticated By: Misty Stanley, M.D.     Medications: Scheduled: . amiodarone  200 mg Oral q morning - 10a  . aspirin EC  81 mg Oral Daily  . atorvastatin  40 mg Oral QPM  . cefTRIAXone (ROCEPHIN)  IV  1 g Intravenous Q24H  . enoxaparin (LOVENOX) injection  40 mg Subcutaneous Q24H  . feeding supplement  237 mL Oral BID BM  . ferrous gluconate  325 mg Oral BID  . letrozole  2.5 mg Oral q morning - 10a  . metoprolol  100 mg Oral BID  . mirtazapine  15 mg Oral QHS  . multivitamin with minerals  1 tablet Oral q morning - 10a  . pantoprazole  40 mg Oral Daily  . polyethylene glycol  17 g Oral q morning - 10a  . senna-docusate  1 tablet Oral BID  . vancomycin  750 mg Intravenous Q24H  . [START ON 10/13/2012] Vitamin D (Ergocalciferol)  50,000 Units Oral Q Mon   Continuous: . 0.9 % NaCl with KCl 20 mEq / L 75 mL/hr at 10/10/12 0459     Assessment/Plan: Principal Problem: 1. Cellulitis and abscess- continue Vanc/Rocephin.  Awaiting Ortho evaluation to determine if further intervention (?I&D or imaging) is needed to rule out underlying abscess.  Will need PICC line prior to discharge.  Check blood cultures.  Active Problems: 2. Failure to thrive- resume PT/OT.  Weight bearing status per Ortho. 3. Protein calorie malnutrition- continue Remeron, ensure. 4. Dyspnea- crackles may be related to atelectasis.  Will obtain CT angiogram to rule out PE (high risk) or interstitial disease related to Amiodarone.  Add bronchodilators due to wheezing.  Incentive spirometry. 5. PAF (paroxysmal atrial fibrillation), maintaining SR- continue Amiodarone.    Off Coumadin prior to admission per Dr. Rollene Fare due to fall risk.  Consider Eliquis longterm. 6. Hypertension- BP trending up.  Consider ACE inhibitor (avoid diuretics) if BP remains elevated once stable after contrast load with CT. 7. CAD in native artery, non obstructive by cath 2000- continue medical therapy. 8. Disposition- anticipate discharge to SNF early next week.      LOS: 2 days   Christyna Letendre,W DOUGLAS 10/10/2012, 7:40 AM   Chest CT is negative for PE but does show moderate to large bilateral pleural effusions with probable pulmonary edema.  Will give 1 dose of Lasix IV and assess clinical response.  Monitor daily weights and I/Os.  Check BNP- if low or no improvement with Lasix consider thoracentesis and further evaluation for ?Amiodarone related lung toxicity.

## 2012-10-10 NOTE — Evaluation (Signed)
Occupational Therapy Evaluation Patient Details Name: Holly Weaver MRN: [id number] DOB: 28-Dec-1923 Today's Date: 10/10/2012 Time: [id number] OT Time Calculation (min): 21 min  OT Assessment / Plan / Recommendation History of present illness Patient is an 79 y/o female admitted from [facility] wtih FTT and surgical site infection s/p left hip fx and ORIF 8/31.   Clinical Impression   Pt demonstrates decr in oxygen saturations into middle 80s with mobility on 2 L. Pt needed prolonged time and pursed lip breathing to rebound. HR incr 140 with mobility. Pt limited by pain and decr oxygen levels. Recommend return to SNF at this time.    OT Assessment  Patient needs continued OT Services    Follow Up Recommendations  SNF    Barriers to Discharge      Equipment Recommendations  Other (comment) (defer to snf)    Recommendations for Other Services    Frequency  Min 2X/week    Precautions / Restrictions Precautions Precautions: Fall Restrictions Weight Bearing Restrictions: Yes LLE Weight Bearing: Touchdown weight bearing   Pertinent Vitals/Pain Pain with mobility but at rest 3 out 10    ADL  Eating/Feeding: Modified independent Where Assessed - Eating/Feeding: Chair Grooming: Wash/dry face;Wash/dry hands;Modified independent Where Assessed - Grooming: Supported sitting Upper Body Dressing: Minimal assistance Where Assessed - Upper Body Dressing: Supported sitting Lower Body Dressing: +1 Total assistance Where Assessed - Lower Body Dressing: Supported sit to Lobbyist: +2 Total assistance Toilet Transfer: Patient Percentage: 50% Armed forces technical officer Method: Arts development officer: Raised toilet seat with arms (or 3-in-1 over toilet) Toileting - Clothing Manipulation and Hygiene: Maximal assistance Where Assessed - Best boy and Hygiene: Sit to stand from 3-in-1 or toilet Equipment Used: Gait belt;Rolling  walker Transfers/Ambulation Related to ADLs: Pt completed sit<>Stand and stand pivot to the chair. Pt needed (A) to progress the RW ADL Comments: Pt supine in bed on arrival and very pleasant. Pt very grateful for any opportunity for mobility. Pt reports changes in breathing on arrival. Pt did decr saturations during session to middle 80s on 2 L oxgyen. Pt reports awaiting information on pending surgery that was scheduled 10/09/12 and not seeing a MD to discuss the possible surgery. Pt requesting more information.    OT Diagnosis: Generalized weakness;Acute pain  OT Problem List: Decreased strength;Decreased activity tolerance;Impaired balance (sitting and/or standing);Decreased safety awareness;Decreased knowledge of use of DME or AE;Decreased knowledge of precautions;Pain OT Treatment Interventions: Self-care/ADL training;Therapeutic exercise;DME and/or AE instruction;Therapeutic activities;Patient/family education;Balance training   OT Goals(Current goals can be found in the care plan section) Acute Rehab OT Goals Patient Stated Goal: To get around easier OT Goal Formulation: With patient Time For Goal Achievement: 10/24/12 Potential to Achieve Goals: Good ADL Goals Pt Will Transfer to Toilet: with mod assist;bedside commode Additional ADL Goal #1: Pt will complete bed mobilty Min (A) with HOb 15 degrees with rails as needed  Visit Information  Last OT Received On: 10/10/12 Assistance Needed: +2 (for ambulation) History of Present Illness: Patient is an 79 y/o female admitted from [facility] wtih FTT and surgical site infection s/p left hip fx and ORIF 8/31.       Prior Functioning     Home Living Family/patient expects to be discharged to:: Skilled nursing facility Living Arrangements: Alone Prior Function Level of Independence: Independent with assistive device(s) Communication Communication: No difficulties Dominant Hand: Right         Vision/Perception Vision -  History Baseline Vision: Wears glasses all  the time Patient Visual Report: No change from baseline   Cognition  Cognition Arousal/Alertness: Awake/alert Behavior During Therapy: WFL for tasks assessed/performed Overall Cognitive Status: Within Functional Limits for tasks assessed    Extremity/Trunk Assessment Upper Extremity Assessment Upper Extremity Assessment: Overall WFL for tasks assessed Lower Extremity Assessment Lower Extremity Assessment: Defer to PT evaluation RLE Deficits / Details: AROM WFL, strength grossly 4/5 LLE Deficits / Details: AAROM grossly WFL for mobility with pain in hip, strength grossly 3/5 Cervical / Trunk Assessment Cervical / Trunk Assessment: Other exceptions Cervical / Trunk Exceptions: scoliosis     Mobility Bed Mobility Bed Mobility: Supine to Sit;Sitting - Scoot to Edge of Bed Supine to Sit: 3: Mod assist Sitting - Scoot to Edge of Bed: 3: Mod assist Details for Bed Mobility Assistance: assist to scoot hips and lift trunk Transfers Transfers: Sit to Stand;Stand to Sit Sit to Stand: 1: +2 Total assist;From bed Sit to Stand: Patient Percentage: 50% Stand to Sit: To chair/3-in-1;With armrests;1: +2 Total assist Stand to Sit: Patient Percentage: 50% Details for Transfer Assistance: assist to maneuver walker to pivot to sit in chair and to maintain TDWB left LE     Exercise Total Joint Exercises Ankle Circles/Pumps: AROM;Both;5 reps;Supine Heel Slides: AAROM;Left;5 reps;Supine   Balance Static Sitting Balance Static Sitting - Balance Support: Feet supported Static Sitting - Level of Assistance: 5: Stand by assistance   End of Session OT - End of Session Activity Tolerance: Patient tolerated treatment well Patient left: in chair;with call bell/phone within reach Nurse Communication: Mobility status;Precautions  GO     Peri Maris 10/10/2012, 2:06 PM Pager: [phone]

## 2012-10-10 NOTE — Evaluation (Signed)
Physical Therapy Evaluation Patient Details Name: Holly Weaver MRN: [id number] DOB: [birth year] Today's Date: 10/10/2012 Time: [id number] PT Time Calculation (min): 36 min  PT Assessment / Plan / Recommendation History of Present Illness  Patient is an 79 y/o female admitted from [facility] wtih FTT and surgical site infection s/p left hip fx and ORIF 8/31.  Clinical Impression  Patient presents with decreased mobility due to deficits listed below.  She will benefit from skilled PT in the acute setting to maximize independence and allow return to independent following SNF stay.      PT Assessment  Patient needs continued PT services    Follow Up Recommendations  SNF       Barriers to Discharge Decreased caregiver support      Equipment Recommendations  None recommended by PT       Frequency Min 3X/week    Precautions / Restrictions Precautions Precautions: Fall Restrictions Weight Bearing Restrictions: Yes LLE Weight Bearing: Touchdown weight bearing   Pertinent Vitals/Pain C/o pain left hip with mobility (3/10 at rest) SpO2 85% on 2L O2 with dyspnea after up to chair, increased to 95% and maintained with PLB      Mobility  Bed Mobility Bed Mobility: Supine to Sit;Sitting - Scoot to Edge of Bed Supine to Sit: 3: Mod assist Sitting - Scoot to Edge of Bed: 3: Mod assist Details for Bed Mobility Assistance: assist to scoot hips and lift trunk Transfers Transfers: Sit to Stand Sit to Stand: 1: +2 Total assist;From bed Sit to Stand: Patient Percentage: 50% Stand to Sit: To chair/3-in-1;With armrests;1: +2 Total assist Stand to Sit: Patient Percentage: 50% Stand Pivot Transfers: 1: +2 Total assist Stand Pivot Transfers: Patient Percentage: 60% Details for Transfer Assistance: assist to maneuver walker to pivot to sit in chair and to maintain TDWB left LE    Exercises Total Joint Exercises Ankle Circles/Pumps: AROM;Both;5 reps;Supine Heel Slides: AAROM;Left;5  reps;Supine   PT Diagnosis: Difficulty walking;Acute pain;Generalized weakness  PT Problem List: Decreased strength;Decreased range of motion;Decreased activity tolerance;Decreased balance;Decreased mobility;Pain;Decreased knowledge of precautions;Decreased knowledge of use of DME PT Treatment Interventions: DME instruction;Balance training;Gait training;Functional mobility training;Patient/family education;Therapeutic activities;Therapeutic exercise     PT Goals(Current goals can be found in the care plan section) Acute Rehab PT Goals Patient Stated Goal: To get around easier PT Goal Formulation: With patient Time For Goal Achievement: 10/24/12 Potential to Achieve Goals: Good  Visit Information  Last PT Received On: 10/10/12 Assistance Needed: +2 (for ambulation) History of Present Illness: Patient is an 79 y/o female admitted from [facility] wtih FTT and surgical site infection s/p left hip fx and ORIF 8/31.       Prior [place] expects to be discharged to:: Skilled nursing facility Living Arrangements: Alone Prior Function Level of Independence: Independent with assistive device(s) Communication Communication: No difficulties Dominant Hand: Right    Cognition  Cognition Arousal/Alertness: Awake/alert Behavior During Therapy: WFL for tasks assessed/performed Overall Cognitive Status: Within Functional Limits for tasks assessed    Extremity/Trunk Assessment Upper Extremity Assessment Upper Extremity Assessment: Defer to OT evaluation Lower Extremity Assessment Lower Extremity Assessment: RLE deficits/detail;LLE deficits/detail RLE Deficits / Details: AROM WFL, strength grossly 4/5 LLE Deficits / Details: AAROM grossly WFL for mobility with pain in hip, strength grossly 3/5 Cervical / Trunk Assessment Cervical / Trunk Assessment: Other exceptions   Balance Static Sitting Balance Static Sitting - Balance Support: Feet supported Static  Sitting - Level of Assistance: 5: Stand by assistance  End of Session PT - End of Session Equipment Utilized During Treatment: Gait belt Activity Tolerance: Patient limited by fatigue (SOB) Patient left: in chair;with call bell/phone within reach Nurse Communication: Mobility status;Other (comment) (decreased SpO2 with mobility)  GP     Westlynn Fifer,CYNDI 10/10/2012, 1:27 PM [place], [place] 10/10/2012

## 2012-10-11 DIAGNOSIS — S72143A Displaced intertrochanteric fracture of unspecified femur, initial encounter for closed fracture: Secondary | ICD-10-CM | POA: Diagnosis not present

## 2012-10-11 DIAGNOSIS — I10 Essential (primary) hypertension: Secondary | ICD-10-CM | POA: Diagnosis not present

## 2012-10-11 DIAGNOSIS — I4891 Unspecified atrial fibrillation: Secondary | ICD-10-CM | POA: Diagnosis not present

## 2012-10-11 DIAGNOSIS — D500 Iron deficiency anemia secondary to blood loss (chronic): Secondary | ICD-10-CM | POA: Diagnosis not present

## 2012-10-11 LAB — WOUND CULTURE: Gram Stain: NONE SEEN

## 2012-10-11 LAB — BASIC METABOLIC PANEL
BUN: 10 mg/dL (ref 6–23)
Calcium: 8.3 mg/dL — ABNORMAL LOW (ref 8.4–10.5)
GFR calc non Af Amer: 73 mL/min — ABNORMAL LOW (ref >90)
Glucose, Bld: 94 mg/dL (ref 70–99)
Potassium: 3.5 mEq/L (ref 3.5–5.1)

## 2012-10-11 LAB — CBC
Hemoglobin: 10.5 g/dL — ABNORMAL LOW (ref 12.0–15.0)
MCH: 34.4 pg — ABNORMAL HIGH (ref 26.0–34.0)
MCHC: 32.8 g/dL (ref 30.0–36.0)
MCV: 104.9 fL — ABNORMAL HIGH (ref 78.0–100.0)

## 2012-10-11 LAB — URINE CULTURE

## 2012-10-11 NOTE — Progress Notes (Signed)
Subjective: B/c of increased SOB/Cough/Hypoxia a CTPA was done yesterday and showed: Negative for pulmonary embolus.  Moderate to moderately large bilateral pleural effusions. Cardiomegaly with scattered ground-glass attenuation and  interlobular septal thickening suggesting pulmonary edema.  Multiple thoracic compression fractures.  Diuresed  Hip pain is controlled.   Coughing. Looks better than anticipated.  Objective: Vital signs in last 24 hours: Temp:  [97.8 F (36.6 C)-99.1 F (37.3 C)] 98.8 F (37.1 C) (09/20 0647) Pulse Rate:  [64-88] 88 (09/20 0647) Resp:  [18-20] 18 (09/20 0647) BP: (165-178)/(51-55) 178/55 mmHg (09/20 0647) SpO2:  [93 %-97 %] 97 % (09/20 0647) Weight:  [53.6 kg (118 lb 2.7 oz)] 53.6 kg (118 lb 2.7 oz) (09/20 0647) Weight change:  Last BM Date: 10/09/12  CBG (last 3)  No results found for this basename: GLUCAP,  in the last 72 hours  Intake/Output from previous day: 09/19 0701 - 09/20 0700 In: 240 [P.O.:240] Out: 2700 [Urine:2700] Intake/Output this shift:    General appearance: alert and with mild tachypnea.  Cough.  Sitting up. Wearing O2.  Able to talk without strain Eyes: no scleral icterus Throat: oropharynx moist without erythema Resp: crackles in bilateral bases and expiratory wheezing throughout. Decrease BS at bases.  Cough Cardio: regular rate and rhythm with grade 2/6 systolic murmur over apex GI: soft, non-tender; bowel sounds normal; no masses,  no organomegaly Extremities: no clubbing, cyanosis or edema; left lateral hip incision with decreased erythema but persistent thick serous drainage Sig Bruising LLE and LUE   Lab Results:  Recent Labs  10/09/12 0425 10/11/12 0530  NA 132* 134*  K 3.5 3.5  CL 96 92*  CO2 31 33*  GLUCOSE 85 94  BUN 12 10  CREATININE 0.76 0.79  CALCIUM 8.2* 8.3*    Recent Labs  10/09/12 0425  AST 49*  ALT 44*  ALKPHOS 145*  BILITOT 0.9  PROT 5.3*  ALBUMIN 2.8*    Recent Labs   10/08/12 1845 10/09/12 0425 10/11/12 0530  WBC 7.0 4.8 5.2  NEUTROABS 4.9  --   --   HGB 11.0* 10.6* 10.5*  HCT 32.3* 32.2* 32.0*  MCV 101.9* 103.2* 104.9*  PLT 273 275 260   Lab Results  Component Value Date   INR 1.07 09/23/2012   INR 1.20 09/22/2012   INR 2.02* 09/21/2012   No results found for this basename: CKTOTAL, CKMB, CKMBINDEX, TROPONINI,  in the last 72 hours  Recent Labs  10/09/12 0835  TSH 2.727   No results found for this basename: VITAMINB12, FOLATE, FERRITIN, TIBC, IRON, RETICCTPCT,  in the last 72 hours  Studies/Results: Ct Angio Chest Pe W/cm &/or Wo Cm  10/10/2012   CLINICAL DATA:  Dyspnea. History of breast cancer  EXAM: CT ANGIOGRAPHY CHEST WITH CONTRAST  TECHNIQUE: Multidetector CT imaging of the chest was performed using the standard protocol during bolus administration of intravenous contrast. Multiplanar CT image reconstructions including MIPs were obtained to evaluate the vascular anatomy.  CONTRAST:  43mL OMNIPAQUE IOHEXOL 350 MG/ML SOLN  COMPARISON:  PA and lateral chest 10/08/2012.  FINDINGS: No pulmonary embolus is identified. The patient has moderate to moderately large bilateral pleural effusions. No pericardial effusion is seen. There is marked cardiomegaly. A large hiatal hernia is identified. There is no axillary, hilar or mediastinal lymphadenopathy. Lungs demonstrate scattered areas of ground-glass attenuation. Compressive atelectasis from the patient's effusions is also seen. There is some interlobular septal thickening in the apices.  Incidentally imaged upper abdomen shows no focal abnormality.  Bones demonstrate post operative change at T11 and T12 of vertebral augmentation. Compression fracture deformities of T5, T9 and L1 are noted.  Review of the MIP images confirms the above findings.  IMPRESSION: Negative for pulmonary embolus.  Moderate to moderately large bilateral pleural effusions.  Cardiomegaly with scattered ground-glass attenuation and  interlobular septal thickening suggesting pulmonary edema.  Multiple thoracic compression fractures.   Electronically Signed   By: Inge Rise M.D.   On: 10/10/2012 10:58     Medications: Scheduled: . amiodarone  200 mg Oral q morning - 10a  . aspirin EC  81 mg Oral Daily  . atorvastatin  40 mg Oral QPM  . cefTRIAXone (ROCEPHIN)  IV  1 g Intravenous Q24H  . enoxaparin (LOVENOX) injection  40 mg Subcutaneous Q24H  . feeding supplement  237 mL Oral BID BM  . ferrous gluconate  325 mg Oral BID  . furosemide  40 mg Intravenous BID  . letrozole  2.5 mg Oral q morning - 10a  . metoprolol  100 mg Oral BID  . mirtazapine  15 mg Oral QHS  . multivitamin with minerals  1 tablet Oral q morning - 10a  . pantoprazole  40 mg Oral Daily  . polyethylene glycol  17 g Oral q morning - 10a  . senna-docusate  1 tablet Oral BID  . vancomycin  750 mg Intravenous Q24H  . [START ON 10/13/2012] Vitamin D (Ergocalciferol)  50,000 Units Oral Q Mon   Continuous:    Assessment/Plan: Principal Problem: 1. Cellulitis and abscess- continue Vanc/Rocephin.  Still awaiting Ortho evaluation to determine if further intervention (?I&D or imaging) is needed to rule out underlying abscess.  Will need PICC line prior to discharge.  Blood culture P. Will call Dr Jess Barters team and ask them to see her today.  PT/OT recommending SNF  Active Problems: 2. Failure to thrive- resume PT/OT.  Weight bearing status per Ortho. 3. Protein calorie malnutrition- continue Remeron, ensure. 4. Dyspnea c CTPA Showing Sig Pl Effusion - Diurese, bronchodilators, Incentive spirometry and FIO2.  May need thoracentesis.  Last 2D ECHO was 12/2011 and Nml EF, LVH, Multi Valve issues.  Check CXR tomorrow. 5. PAF (paroxysmal atrial fibrillation), maintaining SR- continue Amiodarone.    Off Coumadin prior to admission per Dr. Rollene Fare due to fall risk.  Consider Eliquis longterm. 6. Hypertension- BP trending up.  Consider ACE inhibitor (avoid  diuretics) if BP remains elevated once stable after contrast load with CT. 7. CAD in native artery, non obstructive by cath 2000- continue medical therapy. 8. Disposition- anticipate discharge to SNF early next week.      LOS: 3 days   Vershawn Westrup M 11-May-202014, 8:22 AM

## 2012-10-11 NOTE — Progress Notes (Addendum)
ANTIBIOTIC CONSULT NOTE - FOLLOW UP  Pharmacy Consult for Vanco Indication: Hip surgical site infection  Allergies  Allergen Reactions  . Codeine Nausea And Vomiting    Patient Measurements: Height: 5\' 3"  (160 cm) Weight: 118 lb 2.7 oz (53.6 kg) IBW/kg (Calculated) : 52.4 Adjusted Body Weight:   Vital Signs: Temp: 98.8 F (37.1 C) (09/20 0647) Temp src: Oral (09/20 0647) BP: 178/55 mmHg (09/20 0647) Pulse Rate: 88 (09/20 0647) Intake/Output from previous day: 09/19 0701 - 09/20 0700 In: 240 [P.O.:240] Out: 2700 [Urine:2700] Intake/Output from this shift: Total I/O In: 240 [P.O.:240] Out: 1700 [Urine:1700]  Labs:  Recent Labs  10/08/12 1845 10/09/12 0425 10/11/12 0530  WBC 7.0 4.8 5.2  HGB 11.0* 10.6* 10.5*  PLT 273 275 260  CREATININE 0.77 0.76 0.79   Estimated Creatinine Clearance: 41 ml/min (by C-G formula based on Cr of 0.79). No results found for this basename: VANCOTROUGH, Corlis Leak, VANCORANDOM, GENTTROUGH, GENTPEAK, GENTRANDOM, TOBRATROUGH, TOBRAPEAK, TOBRARND, AMIKACINPEAK, AMIKACINTROU, AMIKACIN,  in the last 72 hours   Microbiology: Recent Results (from the past 720 hour(s))  SURGICAL PCR SCREEN     Status: None   Collection Time    09/21/12  2:07 PM      Result Value Range Status   MRSA, PCR NEGATIVE  NEGATIVE Final   Staphylococcus aureus NEGATIVE  NEGATIVE Final   Comment:            The Xpert SA Assay (FDA     approved for NASAL specimens     in patients over 26 years of age),     is one component of     a comprehensive surveillance     program.  Test performance has     been validated by [employer] for patients greater     than or equal to 56 year old.     It is not intended     to diagnose infection nor to     guide or monitor treatment.  URINE CULTURE     Status: None   Collection Time    09/22/12 10:12 AM      Result Value Range Status   Specimen Description URINE, CLEAN CATCH   Final   Special Requests NONE   Final   Culture  Setup Time     Final   Value: 09/22/2012 16:14     Performed at [facility]     Final   Value: NO GROWTH     Performed at [facility]   Culture     Final   Value: NO GROWTH     Performed at [facility]   Report Status 09/23/2012 FINAL   Final  URINE CULTURE     Status: None   Collection Time    10/08/12  6:20 PM      Result Value Range Status   Specimen Description URINE, CLEAN CATCH   Final   Special Requests NONE   Final   Culture  Setup Time     Final   Value: 10/08/2012 19:17     Performed at [place] PENDING   Incomplete   Culture     Final   Value: Culture reincubated for better growth     Performed at [facility]   Report Status PENDING   Incomplete  WOUND CULTURE     Status: None   Collection Time    10/08/12  8:58 PM      Result Value Range Status   Specimen Description WOUND HIP LEFT   Final   Special Requests NONE   Final   Gram Stain     Final   Value: NO WBC SEEN     RARE SQUAMOUS EPITHELIAL CELLS PRESENT     FEW GRAM POSITIVE COCCI IN PAIRS     IN CLUSTERS     Performed at [facility]   Culture     Final   Value: MODERATE METHICILLIN RESISTANT STAPHYLOCOCCUS AUREUS     Note: RIFAMPIN AND GENTAMICIN SHOULD NOT BE USED AS SINGLE DRUGS FOR TREATMENT OF STAPH INFECTIONS. CRITICAL RESULT CALLED TO, READ BACK BY AND VERIFIED WITH: BRENDA HALL 9/20 @1000  BY REAMM     Performed at [facility]   Report Status 19-Mar-202014 FINAL   Final   Organism ID, Bacteria METHICILLIN RESISTANT STAPHYLOCOCCUS AUREUS   Final    Anti-infectives   Start     Dose/Rate Route Frequency Ordered Stop   10/09/12 2100  cefTRIAXone (ROCEPHIN) 1 g in dextrose 5 % 50 mL IVPB     1 g 100 mL/hr over 30 Minutes Intravenous Every 24 hours 10/09/12 0745     10/09/12 0000  vancomycin (VANCOCIN) IVPB 750 mg/150 ml premix     750 mg 150 mL/hr over 60 Minutes Intravenous Every 24 hours 10/08/12  2318     10/08/12 2045  cefTRIAXone (ROCEPHIN) 1 g in dextrose 5 % 50 mL IVPB     1 g 100 mL/hr over 30 Minutes Intravenous  Once 10/08/12 2038 10/08/12 2139      Assessment: L hip pain s/p ORIF 8/31.  Anticoagulation: PAF: Cards stopped coumadin recently due to fall risk. Lovenox 40mg /d. Hgb 10.5, Plts 260  Infectious Disease: Vanc/Rocephin D#3 for left hip infection - s/p ORIF 8/31. ?I&D - ortho consult. Afeb. WBC 5.2. Pt on Levaquin at SNF for UTI/PNA. Scr 0.79. Increased SOB/cough overnight.  9/17 Vanc>> 9/17 Rocephin>>  9/17 L hip wound>> MRSA 9/17 Urine>>  Cardiovascular: BP 178/55, HR 88. HTN, CAD, Afib, HLD. ASA 81, amio po, Lipitor, metoprolol.  Endocrinology: No hx. Gluc ok.  Gastrointestinal / Nutrition: FTT - poor po intake - Remeron added for appetite stimulant and Ensure. AST/ALT slightly elevated. PO PPI, Vit D/week, Miralax, Senna S  Neurology: A&O  Nephrology: SCr stable 0.79.  Pulmonary: 2L [place], 97%  Hematology / Oncology: Hgb 10.5. h/o breast ca. On femara, ferrous gluconate. PTA Medication Issues: Not resumed: statin, oscal with D  Best Practices: Enox 40  Goal of Therapy:  Vancomycin trough level 15-20 mcg/ml  Plan:  Vancomycin 750mg  IV q24h. Trough before dose tonight.  [place], [employer] October 07, 202014,12:38 PM  Addum:  Vanc trough low at 5.8 mg/L.  Will give 750 mg IV now then change regimen to 500 mg IV q12 hours.  Thanks for allowing pharmacy to be a part of this patient's care.  Excell Seltzer, PharmD Clinical Pharmacist, [phone]

## 2012-10-11 NOTE — Progress Notes (Signed)
Subjective: Ms. Kagan daughter called out of concern that her mother had not been seen by Dr. Sharol Given (Ortho) since being admitted a few days ago.  I apologized because I had no idea she was here and her case had not been checked out to me.  Also, Dr. Sharol Given is out of town.  Holly Weaver is recently post-fixation of a left hip fracture.  Apparently she was admitted a few days ago sick appearing with drainage from her left hip wound.  She has been on IV antibiotics.  She does report left hip pain appropriately for this fracture type.  She has been afebrile.  Objective: Vital signs in last 24 hours: Temp:  [97.8 F (36.6 C)-99.1 F (37.3 C)] 98.8 F (37.1 C) (09/20 0647) Pulse Rate:  [64-88] 88 (09/20 0647) Resp:  [18-20] 18 (09/20 0647) BP: (165-178)/(51-55) 178/55 mmHg (09/20 0647) SpO2:  [93 %-97 %] 97 % (09/20 0647) Weight:  [53.6 kg (118 lb 2.7 oz)] 53.6 kg (118 lb 2.7 oz) (09/20 0647)  Intake/Output from previous day: 09/19 0701 - 09/20 0700 In: 240 [P.O.:240] Out: 2700 [Urine:2700] Intake/Output this shift:     Recent Labs  10/08/12 1845 10/09/12 0425 10/11/12 0530  HGB 11.0* 10.6* 10.5*    Recent Labs  10/09/12 0425 10/11/12 0530  WBC 4.8 5.2  RBC 3.12* 3.05*  HCT 32.2* 32.0*  PLT 275 260    Recent Labs  10/09/12 0425 10/11/12 0530  NA 132* 134*  K 3.5 3.5  CL 96 92*  CO2 31 33*  BUN 12 10  CREATININE 0.76 0.79  GLUCOSE 85 94  CALCIUM 8.2* 8.3*   No results found for this basename: LABPT, INR,  in the last 72 hours  Incision: scant drainage No cellulitis present Compartment soft She is very bruised over her left hip.  The incision itself his painful, but no redness and no gross purulence.  There is no fluctuance.  I did review her hip films and it shows the hip screw may be cutting out due to very soft bone.  This certainly is the source of some of her pain and could account for some of the drainage.  Assessment/Plan: Status-post ORIF of a left  hip fracture with possible hardware failure due to soft bone vs infection 1)  Would continue IV antibiotics for now.  i do not fell any further imaging study is warranted.  If the is infection, attempts to treat with IV antibiotics to salvage the hip should be attempted.  She may not tolerate a larger surgery.  Right now, she is afebrile and her WBC is normal.  I'll talk with her daughter and also contact Dr. Sharol Given when he is back in town tomorrow evening.   BLACKMAN,CHRISTOPHER Y 05/14/202014, 9:04 AM

## 2012-10-12 ENCOUNTER — Inpatient Hospital Stay (HOSPITAL_COMMUNITY): Payer: Medicare Other

## 2012-10-12 DIAGNOSIS — D500 Iron deficiency anemia secondary to blood loss (chronic): Secondary | ICD-10-CM | POA: Diagnosis not present

## 2012-10-12 DIAGNOSIS — S72143A Displaced intertrochanteric fracture of unspecified femur, initial encounter for closed fracture: Secondary | ICD-10-CM | POA: Diagnosis not present

## 2012-10-12 DIAGNOSIS — J90 Pleural effusion, not elsewhere classified: Secondary | ICD-10-CM | POA: Diagnosis not present

## 2012-10-12 DIAGNOSIS — I4891 Unspecified atrial fibrillation: Secondary | ICD-10-CM | POA: Diagnosis not present

## 2012-10-12 DIAGNOSIS — I10 Essential (primary) hypertension: Secondary | ICD-10-CM | POA: Diagnosis not present

## 2012-10-12 LAB — VANCOMYCIN, TROUGH: Vancomycin Tr: 5.8 ug/mL — ABNORMAL LOW (ref 10.0–20.0)

## 2012-10-12 LAB — CBC
MCH: 34.2 pg — ABNORMAL HIGH (ref 26.0–34.0)
MCHC: 32.6 g/dL (ref 30.0–36.0)
MCV: 104.9 fL — ABNORMAL HIGH (ref 78.0–100.0)
Platelets: 245 10*3/uL (ref 150–400)
RDW: 17.8 % — ABNORMAL HIGH (ref 11.5–15.5)
WBC: 4.5 10*3/uL (ref 4.0–10.5)

## 2012-10-12 LAB — BASIC METABOLIC PANEL
BUN: 15 mg/dL (ref 6–23)
CO2: 37 mEq/L — ABNORMAL HIGH (ref 19–32)
Calcium: 8.6 mg/dL (ref 8.4–10.5)
Creatinine, Ser: 0.84 mg/dL (ref 0.50–1.10)

## 2012-10-12 MED ORDER — POTASSIUM CHLORIDE CRYS ER 20 MEQ PO TBCR
40.0000 meq | EXTENDED_RELEASE_TABLET | Freq: Once | ORAL | Status: AC
  Administered 2012-10-12: 40 meq via ORAL
  Filled 2012-10-12: qty 2

## 2012-10-12 MED ORDER — VANCOMYCIN HCL IN DEXTROSE 750-5 MG/150ML-% IV SOLN
750.0000 mg | INTRAVENOUS | Status: AC
  Administered 2012-10-12: 750 mg via INTRAVENOUS
  Filled 2012-10-12: qty 150

## 2012-10-12 MED ORDER — VANCOMYCIN HCL 500 MG IV SOLR
500.0000 mg | Freq: Two times a day (BID) | INTRAVENOUS | Status: DC
  Administered 2012-10-12 – 2012-10-14 (×5): 500 mg via INTRAVENOUS
  Filled 2012-10-12 (×5): qty 500

## 2012-10-12 NOTE — Progress Notes (Signed)
Subjective: B/c of increased SOB/Cough/Hypoxia a CTPA was done and showed: Negative for pulmonary embolus.  Moderate to moderately large bilateral pleural effusions. Cardiomegaly with scattered ground-glass attenuation and  interlobular septal thickening suggesting pulmonary edema.  Multiple thoracic compression fractures.  Diuresed c Lasix  Hip pain is controlled.   Appreciate Dr Ninfa Linden finding this pt and seeing her yesterday. I talked c him yesterday afternoon.  Objective: Vital signs in last 24 hours: Temp:  [98.6 F (37 C)-98.8 F (37.1 C)] 98.8 F (37.1 C) (09/21 [id number]) Pulse Rate:  [64-76] 76 (09/21 0633) Resp:  [18] 18 (09/21 0633) BP: (126-156)/(44-48) 156/48 mmHg (09/21 0633) SpO2:  [95 %-98 %] 97 % (09/21 [id number]) Weight:  [53.6 kg (118 lb 2.7 oz)] 53.6 kg (118 lb 2.7 oz) (09/21 [id number]) Weight change: 0 kg (0 lb) Last BM Date: 10/11/12  CBG (last 3)  No results found for this basename: GLUCAP,  in the last 72 hours  Intake/Output from previous day: 09/20 0701 - 09/21 0700 In: 840 [P.O.:840] Out: 2700 [Urine:2700] Intake/Output this shift:    General appearance: alert and with mild tachypnea.  Cough.  Sitting up. Wearing O2.  Able to talk without strain. Using IS Eyes: no scleral icterus Throat: oropharynx moist without erythema Resp: crackles in bilateral bases and expiratory wheezing throughout. Decrease BS at bases.  Cough Cardio: regular rate and rhythm with grade 2/6 systolic murmur over apex GI: soft, non-tender; bowel sounds normal; no masses,  no organomegaly Extremities: no clubbing, cyanosis or edema; left lateral hip incision with decreased erythema but persistent thick serous drainage Sig Bruising LLE and LUE   Lab Results:  Recent Labs  10/11/12 0530 10/12/12 0500  NA 134* 133*  K 3.5 3.3*  CL 92* 89*  CO2 33* 37*  GLUCOSE 94 92  BUN 10 15  CREATININE 0.79 0.84  CALCIUM 8.3* 8.6   No results found for this basename: AST, ALT, ALKPHOS,  BILITOT, PROT, ALBUMIN,  in the last 72 hours  Recent Labs  10/11/12 0530 10/12/12 0500  WBC 5.2 4.5  HGB 10.5* 11.1*  HCT 32.0* 34.1*  MCV 104.9* 104.9*  PLT 260 245   Lab Results  Component Value Date   INR 1.07 09/23/2012   INR 1.20 09/22/2012   INR 2.02* 09/21/2012   No results found for this basename: CKTOTAL, CKMB, CKMBINDEX, TROPONINI,  in the last 72 hours No results found for this basename: TSH, T4TOTAL, FREET3, T3FREE, THYROIDAB,  in the last 72 hours No results found for this basename: VITAMINB12, FOLATE, FERRITIN, TIBC, IRON, RETICCTPCT,  in the last 72 hours  Studies/Results: Ct Angio Chest Pe W/cm &/or Wo Cm  10/10/2012   CLINICAL DATA:  Dyspnea. History of breast cancer  EXAM: CT ANGIOGRAPHY CHEST WITH CONTRAST  TECHNIQUE: Multidetector CT imaging of the chest was performed using the standard protocol during bolus administration of intravenous contrast. Multiplanar CT image reconstructions including MIPs were obtained to evaluate the vascular anatomy.  CONTRAST:  6mL OMNIPAQUE IOHEXOL 350 MG/ML SOLN  COMPARISON:  PA and lateral chest 10/08/2012.  FINDINGS: No pulmonary embolus is identified. The patient has moderate to moderately large bilateral pleural effusions. No pericardial effusion is seen. There is marked cardiomegaly. A large hiatal hernia is identified. There is no axillary, hilar or mediastinal lymphadenopathy. Lungs demonstrate scattered areas of ground-glass attenuation. Compressive atelectasis from the patient's effusions is also seen. There is some interlobular septal thickening in the apices.  Incidentally imaged upper abdomen shows no focal abnormality. Bones  demonstrate post operative change at T11 and T12 of vertebral augmentation. Compression fracture deformities of T5, T9 and L1 are noted.  Review of the MIP images confirms the above findings.  IMPRESSION: Negative for pulmonary embolus.  Moderate to moderately large bilateral pleural effusions.  Cardiomegaly  with scattered ground-glass attenuation and interlobular septal thickening suggesting pulmonary edema.  Multiple thoracic compression fractures.   Electronically Signed   By: Inge Rise M.D.   On: 10/10/2012 10:58   Dg Chest Port 1 View  10/12/2012   CLINICAL DATA:  Pleural effusion  EXAM: PORTABLE CHEST - 1 VIEW  COMPARISON:  CTA chest dated 10/10/2012  FINDINGS: Small bilateral pleural effusions, decreased.  Underlying chronic interstitial markings/emphysematous changes.  No pneumothorax.  Heart is top-normal in size.  IMPRESSION: Small bilateral pleural effusions, decreased.   Electronically Signed   By: Julian Hy M.D.   On: 10/12/2012 07:11     Medications: Scheduled: . amiodarone  200 mg Oral q morning - 10a  . aspirin EC  81 mg Oral Daily  . atorvastatin  40 mg Oral QPM  . cefTRIAXone (ROCEPHIN)  IV  1 g Intravenous Q24H  . enoxaparin (LOVENOX) injection  40 mg Subcutaneous Q24H  . feeding supplement  237 mL Oral BID BM  . ferrous gluconate  325 mg Oral BID  . furosemide  40 mg Intravenous BID  . letrozole  2.5 mg Oral q morning - 10a  . metoprolol  100 mg Oral BID  . mirtazapine  15 mg Oral QHS  . multivitamin with minerals  1 tablet Oral q morning - 10a  . pantoprazole  40 mg Oral Daily  . polyethylene glycol  17 g Oral q morning - 10a  . senna-docusate  1 tablet Oral BID  . vancomycin  500 mg Intravenous Q12H  . [START ON 10/13/2012] Vitamin D (Ergocalciferol)  50,000 Units Oral Q Mon   Continuous:    Assessment/Plan: Principal Problem: 1. Cellulitis and abscess- continue Vanc/Rocephin. Dr Sharol Given will see pt tomorrow and decide on OR or not.  If goes to OR - Recommend Spinal Anesthesia and may need Pulm eval prior.  May need PICC line prior to discharge.  Blood culture P.   PT/OT recommending SNF  Active Problems: 2. Failure to thrive- resume PT/OT.  Weight bearing status per Ortho. 3. Protein calorie malnutrition- continue Remeron, ensure. 4. Dyspnea c CTPA  Showing Sig Pl Effusion - Diurese, bronchodilators, Incentive spirometry and FIO2.  May need thoracentesis.  Last 2D ECHO was 12/2011 and Nml EF, LVH, Multi Valve issues.  Current CXR shows less effusion. 5. PAF (paroxysmal atrial fibrillation), maintaining SR- continue Amiodarone.    Off Coumadin prior to admission per Dr. Rollene Fare due to fall risk.  Consider Eliquis longterm. 6. Hypertension- BP trending up but ok today and I am not inclined to change anything.  Consider ACE inhibitor. 7. CAD in native artery, non obstructive by cath 2000- continue medical therapy. 8. Disposition- anticipate  SNF on D/C.      LOS: 4 days   Kyron Schlitt M 10/12/2012, 9:30 AM

## 2012-10-12 NOTE — Progress Notes (Addendum)
Clinical Social Work Department CLINICAL SOCIAL WORK PLACEMENT NOTE 10/12/2012  Patient:  Holly Weaver, Holly Weaver  Account Number:  [id number] [place] date:  10/08/2012  Clinical Social Worker:  Blima Rich, Latanya Presser  Date/time:  10/12/2012 08:19 PM  Clinical Social Work is seeking post-discharge placement for this patient at the following level of care:   [place]   (*CSW will update this form in Epic as items are completed)   10/12/2012  Patient/family provided with [facility] Department of Clinical Social Work's list of facilities offering this level of care within the geographic area requested by the patient (or if unable, by the patient's family).  10/12/2012  Patient/family informed of their freedom to choose among providers that offer the needed level of care, that participate in Medicare, Medicaid or managed care program needed by the patient, have an available bed and are willing to accept the patient.  10/12/2012  Patient/family informed of [facility]' ownership interest in [facility], as well as of the fact that they are under no obligation to receive care at this facility.  PASARR submitted to EDS on  PASARR number received from [facility] on   FL2 transmitted to all facilities in geographic area requested by pt/family on  10/12/2012 FL2 transmitted to all facilities within larger geographic area on 10/12/2012  Patient informed that his/her managed care company has contracts with or will negotiate with  certain facilities, including the following:     Patient/family informed of bed offers received:  10-13-12 Patient chooses bed at [facility] Physician recommends and patient chooses bed at    Patient to be transferred to [facility] on 10-14-12   Patient to be transferred to facility by [facility]  The following physician request were entered in Epic:   Additional Comments: Patient and daughter do not want to go back to [facility] for rehab.  First choice is [place] and second choice is [place].

## 2012-10-12 NOTE — Progress Notes (Signed)
There was a large amount reddish-brown drainage at the wound site, wound was cleaned and placed a new form dressing. Patient complained of soreness at the site. Patient denied the need for pain medication at this time.

## 2012-10-13 DIAGNOSIS — I4891 Unspecified atrial fibrillation: Secondary | ICD-10-CM | POA: Diagnosis not present

## 2012-10-13 DIAGNOSIS — I10 Essential (primary) hypertension: Secondary | ICD-10-CM | POA: Diagnosis not present

## 2012-10-13 DIAGNOSIS — J90 Pleural effusion, not elsewhere classified: Secondary | ICD-10-CM | POA: Diagnosis present

## 2012-10-13 DIAGNOSIS — S72143A Displaced intertrochanteric fracture of unspecified femur, initial encounter for closed fracture: Secondary | ICD-10-CM | POA: Diagnosis not present

## 2012-10-13 DIAGNOSIS — D500 Iron deficiency anemia secondary to blood loss (chronic): Secondary | ICD-10-CM | POA: Diagnosis not present

## 2012-10-13 LAB — BASIC METABOLIC PANEL
BUN: 21 mg/dL (ref 6–23)
Calcium: 8.6 mg/dL (ref 8.4–10.5)
Creatinine, Ser: 0.87 mg/dL (ref 0.50–1.10)
GFR calc Af Amer: 67 mL/min — ABNORMAL LOW (ref >90)
GFR calc non Af Amer: 58 mL/min — ABNORMAL LOW (ref >90)
Glucose, Bld: 90 mg/dL (ref 70–99)
Potassium: 3.5 mEq/L (ref 3.5–5.1)

## 2012-10-13 LAB — PRO B NATRIURETIC PEPTIDE: Pro B Natriuretic peptide (BNP): 897.9 pg/mL — ABNORMAL HIGH (ref 0–450)

## 2012-10-13 LAB — CBC
Hemoglobin: 10 g/dL — ABNORMAL LOW (ref 12.0–15.0)
MCH: 34 pg (ref 26.0–34.0)
MCHC: 32.6 g/dL (ref 30.0–36.0)
Platelets: 246 10*3/uL (ref 150–400)
RBC: 2.94 MIL/uL — ABNORMAL LOW (ref 3.87–5.11)
RDW: 17.7 % — ABNORMAL HIGH (ref 11.5–15.5)
WBC: 5.1 10*3/uL (ref 4.0–10.5)

## 2012-10-13 MED ORDER — FUROSEMIDE 40 MG PO TABS
40.0000 mg | ORAL_TABLET | Freq: Every day | ORAL | Status: DC
  Administered 2012-10-13 – 2012-10-14 (×2): 40 mg via ORAL
  Filled 2012-10-13 (×2): qty 1

## 2012-10-13 NOTE — Progress Notes (Signed)
Physical Therapy Treatment Patient Details Name: TIERNAN ODONALD MRN: [id number] DOB: Jan 13, 1924 Today's Date: 10/13/2012 Time: [id number] PT Time Calculation (min): 24 min  PT Assessment / Plan / Recommendation  History of Present Illness Patient is an 79 y/o female admitted from [facility] wtih FTT and surgical site infection s/p left hip fx and ORIF 8/31.   PT Comments   Pt able to amb today with 2+ (A). Pt has difficulty maintaining TWB status and requires max cues and (A) to maintain. Pt fatigues quickly and requires rest breaks to incr amb. Cont per POC.   Follow Up Recommendations  SNF     Does the patient have the potential to tolerate intense rehabilitation     Barriers to Discharge        Equipment Recommendations  None recommended by PT    Recommendations for Other Services    Frequency Min 3X/week   Progress towards PT Goals Progress towards PT goals: Progressing toward goals  Plan Current plan remains appropriate    Precautions / Restrictions Precautions Precautions: Fall Restrictions Weight Bearing Restrictions: Yes LLE Weight Bearing: Touchdown weight bearing   Pertinent Vitals/Pain 8/10; patient repositioned for comfort. Pt premedicated.     Mobility  Bed Mobility Bed Mobility: Not assessed Transfers Transfers: Sit to Stand;Stand to Sit Sit to Stand: From chair/3-in-1;With armrests;1: +2 Total assist Sit to Stand: Patient Percentage: 60% Stand to Sit: To chair/3-in-1;With armrests;1: +2 Total assist Stand to Sit: Patient Percentage: 60% Details for Transfer Assistance: pt demo difficulty maintaining TWB status; max cues for hand placement and safety  Ambulation/Gait Ambulation/Gait Assistance: 1: +2 Total assist Ambulation/Gait: Patient Percentage: 70% Ambulation Distance (Feet): 6 Feet (x2) Assistive device: Rolling walker Ambulation/Gait Assistance Details: cues for sequencing and safety with RW: pt has difficulty maintaining TWB status;  required rest break due to fatigue and cues for upright posture  Gait Pattern: Step-to pattern (hop to) Gait velocity: decreased  General Gait Details: (A) to manage IV and O2; pt amb on 3L O2  Stairs: No Wheelchair Mobility Wheelchair Mobility: No    Exercises Total Joint Exercises Ankle Circles/Pumps: AROM;Both;10 reps;Seated General Exercises - Lower Extremity Long Arc Quad: AROM;Left;10 reps;Seated Hip ABduction/ADduction: AROM;Left;10 reps;Seated   PT Diagnosis:    PT Problem List:   PT Treatment Interventions:     PT Goals (current goals can now be found in the care plan section) Acute Rehab PT Goals Patient Stated Goal: to get better PT Goal Formulation: With patient Time For Goal Achievement: 10/24/12 Potential to Achieve Goals: Good  Visit Information  Last PT Received On: 10/13/12 Assistance Needed: +2 (for ambulation ) History of Present Illness: Patient is an 79 y/o female admitted from [facility] wtih FTT and surgical site infection s/p left hip fx and ORIF 8/31.    Subjective Data  Subjective: Pt sitting in chair; agreeable to therapy. stated "i dont know if i can hop but we can try"  Patient Stated Goal: to get better   Cognition  Cognition Arousal/Alertness: Awake/alert Behavior During Therapy: WFL for tasks assessed/performed Overall Cognitive Status: Within Functional Limits for tasks assessed    Balance  Balance Balance Assessed: Yes Static Standing Balance Static Standing - Balance Support: Bilateral upper extremity supported;During functional activity Static Standing - Level of Assistance: 2: Max assist  End of Session PT - End of Session Equipment Utilized During Treatment: Gait belt Activity Tolerance: Patient limited by fatigue Patient left: in chair;with call bell/phone within reach Nurse Communication: Mobility status  [place], [place], [place] 10/13/2012, 12:45 PM

## 2012-10-13 NOTE — Progress Notes (Signed)
Patient ID: Holly Weaver, female   DOB: Oct 17, 1923, 79 y.o.   MRN: [id number] Drainage from left hip wound is almost completely stopped. There is a very small drop of serous sanguinous drainage. There is no cellulitis. Continue nutritional support. Touchdown weightbearing left hip.

## 2012-10-13 NOTE — Clinical Social Work Note (Signed)
Clinical Social Worker updated patient and daughter on the list of bed offers received. Daughter reported that she was interested in either [facility] or [facility]. [facility] has not responded and [facility] is considering at this moment. CSW informed daughter that if her preferred facilities are not available, then she and patient will need to consider from the list of facilities that responded with availability.   Leandro Reasoner MSW, [place]

## 2012-10-13 NOTE — Progress Notes (Signed)
Subjective: Feeling better.  Breathing is much better with less shortness of breath.  Continued left hip pain but decreased drainage.  Objective: Vital signs in last 24 hours: Temp:  [97.9 F (36.6 C)-98.6 F (37 C)] 98.3 F (36.8 C) (09/22 0618) Pulse Rate:  [59-69] 59 (09/22 0618) Resp:  [18-20] 18 (09/22 0618) BP: (114-144)/(43-78) 144/54 mmHg (09/22 0618) SpO2:  [96 %-99 %] 98 % (09/22 0618) Weight:  [52.3 kg (115 lb 4.8 oz)] 52.3 kg (115 lb 4.8 oz) (09/22 0618) Weight change: -1.3 kg (-2 lb 13.9 oz) Last BM Date: 10/12/12  CBG (last 3)  No results found for this basename: GLUCAP,  in the last 72 hours  Intake/Output from previous day: 09/21 0701 - 09/22 0700 In: 1180 [P.O.:1080; IV Piggyback:100] Out: 750 [Urine:750] Intake/Output this shift:    General appearance: alert Eyes: no scleral icterus Throat: oropharynx moist without erythema Resp: decreased breath sounds bilateral bases Cardio: RRR with Grade 2/6 systolic murmur over apex GI: soft, non-tender; bowel sounds normal; no masses,  no organomegaly Extremities: no clubbing, cyanosis or edema; left hip erythema resolved.  Minimal serosanguinous draiange.; minimal tenderness   Lab Results:  Recent Labs  10/12/12 0500 10/13/12 0440  NA 133* 135  K 3.3* 3.5  CL 89* 92*  CO2 37* 37*  GLUCOSE 92 90  BUN 15 21  CREATININE 0.84 0.87  CALCIUM 8.6 8.6     Recent Labs  10/12/12 0500 10/13/12 0440  WBC 4.5 5.1  HGB 11.1* 10.0*  HCT 34.1* 30.7*  MCV 104.9* 104.4*  PLT 245 246   Lab Results  Component Value Date   INR 1.07 09/23/2012   INR 1.20 09/22/2012   INR 2.02* 09/21/2012   BNP (last 3 results)  Recent Labs  10/10/12 1357 10/12/12 0500 10/13/12 0440  PROBNP 4293.0* 1146.0* 897.9*     Wound Culture- MRSA Blood Cultures X 2- negative  Studies/Results: Dg Chest Port 1 View  10/12/2012   CLINICAL DATA:  Pleural effusion  EXAM: PORTABLE CHEST - 1 VIEW  COMPARISON:  CTA chest dated  10/10/2012  FINDINGS: Small bilateral pleural effusions, decreased.  Underlying chronic interstitial markings/emphysematous changes.  No pneumothorax.  Heart is top-normal in size.  IMPRESSION: Small bilateral pleural effusions, decreased.   Electronically Signed   By: Julian Hy M.D.   On: 10/12/2012 07:11     Medications: Scheduled: . amiodarone  200 mg Oral q morning - 10a  . aspirin EC  81 mg Oral Daily  . atorvastatin  40 mg Oral QPM  . cefTRIAXone (ROCEPHIN)  IV  1 g Intravenous Q24H  . enoxaparin (LOVENOX) injection  40 mg Subcutaneous Q24H  . feeding supplement  237 mL Oral BID BM  . ferrous gluconate  325 mg Oral BID  . furosemide  40 mg Intravenous BID  . letrozole  2.5 mg Oral q morning - 10a  . metoprolol  100 mg Oral BID  . mirtazapine  15 mg Oral QHS  . multivitamin with minerals  1 tablet Oral q morning - 10a  . pantoprazole  40 mg Oral Daily  . polyethylene glycol  17 g Oral q morning - 10a  . senna-docusate  1 tablet Oral BID  . vancomycin  500 mg Intravenous Q12H  . Vitamin D (Ergocalciferol)  50,000 Units Oral Q Mon   Continuous:   Assessment/Plan: Principal Problem: 1. MRSA Cellulitis and abscess- improving with Vancomycin and Rocephin. Will insert midline to complete 2 weeks of IV antibiotics.  No surgical management recommended by Ortho. Active Problems:  2. Failure to thrive- resume PT/OT. Touchdown weight bearing left hip per Ortho.  3. Protein calorie malnutrition- continue Remeron, ensure.  4. Bilateral pleural effusions/Dyspnea/Acute Congestive Heart failure with preserved EF- improved with diuresis.  Likely secondary to low albumin state.  Decrease Lasix to 40mg  po daily due to contraction alkalosis.  EF 50-55% in 12/13.  [place] I/Os, weights.   5. PAF (paroxysmal atrial fibrillation), maintaining SR- continue Amiodarone. Off Coumadin prior to admission per Dr. Rollene Fare due to fall risk. Consider Eliquis longterm.  6. Hypertension-Improved with  diuresis. 7. CAD in native artery, non obstructive by cath 2000- continue medical therapy.  8. Disposition- anticipate discharge to SNF tomorrow if stable after midline placement.    LOS: 5 days   Anddy Wingert,W [place] 10/13/2012, 7:38 AM

## 2012-10-14 DIAGNOSIS — R0600 Dyspnea, unspecified: Secondary | ICD-10-CM | POA: Diagnosis not present

## 2012-10-14 DIAGNOSIS — S72143A Displaced intertrochanteric fracture of unspecified femur, initial encounter for closed fracture: Secondary | ICD-10-CM | POA: Diagnosis not present

## 2012-10-14 DIAGNOSIS — J189 Pneumonia, unspecified organism: Secondary | ICD-10-CM | POA: Diagnosis not present

## 2012-10-14 DIAGNOSIS — I4901 Ventricular fibrillation: Secondary | ICD-10-CM | POA: Diagnosis not present

## 2012-10-14 DIAGNOSIS — I4891 Unspecified atrial fibrillation: Secondary | ICD-10-CM | POA: Diagnosis not present

## 2012-10-14 DIAGNOSIS — M6281 Muscle weakness (generalized): Secondary | ICD-10-CM | POA: Diagnosis not present

## 2012-10-14 DIAGNOSIS — L02419 Cutaneous abscess of limb, unspecified: Secondary | ICD-10-CM | POA: Diagnosis not present

## 2012-10-14 DIAGNOSIS — I10 Essential (primary) hypertension: Secondary | ICD-10-CM | POA: Diagnosis not present

## 2012-10-14 DIAGNOSIS — R0602 Shortness of breath: Secondary | ICD-10-CM | POA: Diagnosis not present

## 2012-10-14 DIAGNOSIS — Z5189 Encounter for other specified aftercare: Secondary | ICD-10-CM | POA: Diagnosis not present

## 2012-10-14 DIAGNOSIS — A4902 Methicillin resistant Staphylococcus aureus infection, unspecified site: Secondary | ICD-10-CM | POA: Diagnosis not present

## 2012-10-14 DIAGNOSIS — Z4789 Encounter for other orthopedic aftercare: Secondary | ICD-10-CM | POA: Diagnosis not present

## 2012-10-14 DIAGNOSIS — I509 Heart failure, unspecified: Secondary | ICD-10-CM | POA: Diagnosis not present

## 2012-10-14 DIAGNOSIS — L0291 Cutaneous abscess, unspecified: Secondary | ICD-10-CM | POA: Diagnosis not present

## 2012-10-14 DIAGNOSIS — H1045 Other chronic allergic conjunctivitis: Secondary | ICD-10-CM | POA: Diagnosis not present

## 2012-10-14 DIAGNOSIS — D500 Iron deficiency anemia secondary to blood loss (chronic): Secondary | ICD-10-CM | POA: Diagnosis not present

## 2012-10-14 DIAGNOSIS — R627 Adult failure to thrive: Secondary | ICD-10-CM | POA: Diagnosis not present

## 2012-10-14 LAB — BASIC METABOLIC PANEL
CO2: 36 mEq/L — ABNORMAL HIGH (ref 19–32)
Calcium: 8.7 mg/dL (ref 8.4–10.5)
Creatinine, Ser: 0.86 mg/dL (ref 0.50–1.10)
GFR calc non Af Amer: 59 mL/min — ABNORMAL LOW (ref >90)
Glucose, Bld: 89 mg/dL (ref 70–99)

## 2012-10-14 LAB — CBC
HCT: 31.7 % — ABNORMAL LOW (ref 36.0–46.0)
MCH: 34.4 pg — ABNORMAL HIGH (ref 26.0–34.0)
MCHC: 32.5 g/dL (ref 30.0–36.0)
MCV: 106 fL — ABNORMAL HIGH (ref 78.0–100.0)
Platelets: 238 10*3/uL (ref 150–400)
RDW: 18 % — ABNORMAL HIGH (ref 11.5–15.5)
WBC: 4.4 10*3/uL (ref 4.0–10.5)

## 2012-10-14 LAB — PRO B NATRIURETIC PEPTIDE: Pro B Natriuretic peptide (BNP): 869.1 pg/mL — ABNORMAL HIGH (ref 0–450)

## 2012-10-14 MED ORDER — DEXTROSE 5 % IV SOLN: 1.0000 g | INTRAVENOUS | Status: AC

## 2012-10-14 MED ORDER — MIRTAZAPINE 15 MG PO TABS: 15.0000 mg | ORAL_TABLET | Freq: Every day | ORAL | Status: DC

## 2012-10-14 MED ORDER — ENOXAPARIN SODIUM 40 MG/0.4ML ~~LOC~~ SOLN
40.0000 mg | SUBCUTANEOUS | Status: DC
Start: 2012-10-14 — End: 2013-05-26

## 2012-10-14 MED ORDER — SODIUM CHLORIDE 0.9 % IJ SOLN: 10.0000 mL | INTRAMUSCULAR | Status: DC | PRN

## 2012-10-14 MED ORDER — FUROSEMIDE 40 MG PO TABS: 40.0000 mg | ORAL_TABLET | Freq: Every day | ORAL | Status: AC

## 2012-10-14 MED ORDER — ONDANSETRON HCL 4 MG PO TABS: 4.0000 mg | ORAL_TABLET | Freq: Four times a day (QID) | ORAL | Status: DC | PRN

## 2012-10-14 MED ORDER — VANCOMYCIN HCL 500 MG IV SOLR: 500.0000 mg | Freq: Two times a day (BID) | INTRAVENOUS | Status: AC

## 2012-10-14 NOTE — Clinical Social Work Note (Addendum)
Clinical Social Worker facilitated discharge by contacting family and patient. Daughter reported that she has completed paperwork at [facility]. CSW will complete discharge packet and place with shadow chart. Patient will be transported via ambulance. CSW will sign off, as social work intervention is no longer needed.   Ambulance reference #- [place] MSW, [place]

## 2012-10-14 NOTE — Discharge Summary (Signed)
DISCHARGE SUMMARY  KMORA KORELL  MR#: [id number]  DOB:29-Jun-1923  Date of Admission: 10/08/2012 Date of Discharge: 10/14/2012  Attending Physician:Nealie Mchatton,W DOUGLAS  Patient's [id number] Nathaneil Canary, MD  Consults:  Meridee Score, MD- Orthopaedics  Discharge Diagnoses: Principal Problem:   MRSA Cellulitis and abscess of left hip surgical incision Active Problems:   Failure to thrive   Protein calorie malnutrition   Pleural effusion, bilateral   Dyspnea   Acute CHF (congestive heart failure)- EF 50-55%   PAF (paroxysmal atrial fibrillation), maintaining SR   Hypertension   CAD in native artery, non obstructive by cath 2000   MVP (mitral valve prolapse)  Past Medical History  Diagnosis Date  . Hypertension   . Osteoporosis   . Scoliosis   . Atrial fibrillation   . Compression fracture 09/21/2012  . Dyslipidemia 09/21/2012  . MVP (mitral valve prolapse) 09/23/2012  . Breast cancer ~ 2006    "left" (10/09/2012)  . Heart murmur   . Shortness of breath     "just now; nose is dry" (10/09/2012)  . History of blood transfusion     "years and years ago; I was anemic" (10/09/2012)  . Cervical spondylolysis 09/21/2012  . Osteoarthritis 09/21/2012  . Arthritis     "hands and legs" (10/09/2012)  . Chronic lower back pain     Past Surgical History  Procedure Laterality Date  . Femur im nail Left 09/21/2012    Procedure: INTRAMEDULLARY (IM) NAIL FEMORAL;  Surgeon: Newt Minion, MD;  Location: [facility];  Service: Orthopedics;  Laterality: Left;  . Hernia repair  1950's?  . Cataract extraction w/ intraocular lens implant Left 2000's  . Breast biopsy Left ~ 2006      Discharge Medications:   Medication List    STOP taking these medications       levofloxacin 500 MG tablet  Commonly known as:  LEVAQUIN     promethazine 25 MG tablet  Commonly known as:  PHENERGAN      TAKE these medications       acetaminophen 325 MG tablet  Commonly known as:  TYLENOL  Take 325 mg by mouth  every 4 (four) hours as needed for pain.     alendronate 70 MG tablet  Commonly known as:  FOSAMAX  Take 70 mg by mouth every 7 (seven) days. Take with a full glass of water on an empty stomach.     ALPRAZolam 0.25 MG tablet  Commonly known as:  XANAX  Take one tablet by mouth every 12 hours as needed for severe anxiety     amiodarone 200 MG tablet  Commonly known as:  PACERONE  Take 200 mg by mouth every morning.     aspirin EC 81 MG tablet  Take 81 mg by mouth every morning.     atorvastatin 40 MG tablet  Commonly known as:  LIPITOR  Take 40 mg by mouth every evening.     calcium-vitamin D 500-200 MG-UNIT per tablet  Commonly known as:  OSCAL WITH D  Take 1 tablet by mouth every morning.     dextrose 5 % SOLN 50 mL with cefTRIAXone 1 G SOLR 1 g  Inject 1 g into the vein daily.     enoxaparin 40 MG/0.4ML injection  Commonly known as:  LOVENOX  Inject 0.4 mLs (40 mg total) into the skin daily.     feeding supplement Liqd  Take 237 mLs by mouth 2 (two) times daily between meals.     ferrous  gluconate 325 MG tablet  Commonly known as:  FERGON  Take 325 mg by mouth 2 (two) times daily.     furosemide 40 MG tablet  Commonly known as:  LASIX  Take 1 tablet (40 mg total) by mouth daily.     HYDROcodone-acetaminophen 7.5-325 MG per tablet  Commonly known as:  NORCO  Take 1 tablet by mouth every 6 (six) hours as needed for pain.     letrozole 2.5 MG tablet  Commonly known as:  FEMARA  Take 2.5 mg by mouth every morning.     metoprolol 100 MG tablet  Commonly known as:  LOPRESSOR  Take 100 mg by mouth 2 (two) times daily.     mirtazapine 15 MG tablet  Commonly known as:  REMERON  Take 1 tablet (15 mg total) by mouth at bedtime.     multivitamin with minerals Tabs tablet  Take 1 tablet by mouth every morning. Centrum 50+     omeprazole 20 MG capsule  Commonly known as:  PRILOSEC  Take 20 mg by mouth daily as needed (for acid reduction).     ondansetron 4 MG  tablet  Commonly known as:  ZOFRAN  Take 1 tablet (4 mg total) by mouth every 6 (six) hours as needed for nausea.     polyethylene glycol packet  Commonly known as:  MIRALAX / GLYCOLAX  Take 17 g by mouth every morning.     senna-docusate 8.6-50 MG per tablet  Commonly known as:  Senokot-S  Take 1 tablet by mouth 2 (two) times daily.     sodium chloride 0.9 % SOLN 100 mL with vancomycin 500 MG SOLR 500 mg  Inject 500 mg into the vein every 12 (twelve) hours.     traMADol 50 MG tablet  Commonly known as:  ULTRAM  Take 1 tablet (50 mg total) by mouth every 8 (eight) hours as needed for pain.     traZODone 50 MG tablet  Commonly known as:  DESYREL  Take 50 mg by mouth every 12 (twelve) hours as needed for sleep.     Vitamin D (Ergocalciferol) 50000 UNITS Caps capsule  Commonly known as:  DRISDOL  Take 50,000 Units by mouth every 7 (seven) days.        Hospital Procedures: Dg Chest 2 View  10/08/2012   *RADIOLOGY REPORT*  Clinical Data: Cough.  Pneumonia.  CHEST - 2 VIEW  Comparison: 09/22/2012  Findings: Hyperexpansion is consistent with emphysema. The cardiopericardial silhouette is enlarged.  There is bibasilar collapse / consolidation with small bilateral pleural effusions, right greater than left. Bones are diffusely demineralized. Multiple thoracolumbar compression fractures are evident.  IMPRESSION: Emphysema with cardiomegaly and bibasilar collapse / consolidation.  Small bilateral pleural effusions.   Original Report Authenticated By: Misty Stanley, M.D.   Dg Hip Complete Left  10/08/2012   *RADIOLOGY REPORT*  Clinical Data: Left hip pain after fall.  LEFT HIP - COMPLETE 2+ VIEW  Comparison: 09/21/2012  Findings: Frontal pelvis shows diffuse osteopenia.  Dynamic hip screw noted in the proximal left femur for intertrochanteric fracture.  SI joints and symphysis pubis are unremarkable.  IMPRESSION: Status post ORIF for left femoral neck fracture.   Original Report Authenticated  By: Misty Stanley, M.D.    Ct Angio Chest Pe W/cm &/or Wo Cm  10/10/2012   CLINICAL DATA:  Dyspnea. History of breast cancer  EXAM: CT ANGIOGRAPHY CHEST WITH CONTRAST  TECHNIQUE: Multidetector CT imaging of the chest was performed using the standard  protocol during bolus administration of intravenous contrast. Multiplanar CT image reconstructions including MIPs were obtained to evaluate the vascular anatomy.  CONTRAST:  96mL OMNIPAQUE IOHEXOL 350 MG/ML SOLN  COMPARISON:  PA and lateral chest 10/08/2012.  FINDINGS: No pulmonary embolus is identified. The patient has moderate to moderately large bilateral pleural effusions. No pericardial effusion is seen. There is marked cardiomegaly. A large hiatal hernia is identified. There is no axillary, hilar or mediastinal lymphadenopathy. Lungs demonstrate scattered areas of ground-glass attenuation. Compressive atelectasis from the patient's effusions is also seen. There is some interlobular septal thickening in the apices.  Incidentally imaged upper abdomen shows no focal abnormality. Bones demonstrate post operative change at T11 and T12 of vertebral augmentation. Compression fracture deformities of T5, T9 and L1 are noted.  Review of the MIP images confirms the above findings.  IMPRESSION: Negative for pulmonary embolus.  Moderate to moderately large bilateral pleural effusions.  Cardiomegaly with scattered ground-glass attenuation and interlobular septal thickening suggesting pulmonary edema.  Multiple thoracic compression fractures.   Electronically Signed   By: Inge Rise M.D.   On: 10/10/2012 10:58   Dg Chest Port 1 View  10/12/2012   CLINICAL DATA:  Pleural effusion  EXAM: PORTABLE CHEST - 1 VIEW  COMPARISON:  CTA chest dated 10/10/2012  FINDINGS: Small bilateral pleural effusions, decreased.  Underlying chronic interstitial markings/emphysematous changes.  No pneumothorax.  Heart is top-normal in size.  IMPRESSION: Small bilateral pleural effusions,  decreased.   Electronically Signed   By: Julian Hy M.D.   On: 10/12/2012 07:11    History of Present Illness: The patient is an 79 year old Caucasian woman who suffered a fall with left hip fracture on 09/21/2012 leading to open reduction with internal fixation and discharged to a skilled nursing facility on 09/24/2012. She had no immediate complications. Within a short period after arrival at the skilled nursing facility she had a chest x-ray showing bibasilar airspace disease and evidence of a urinary tract infection, and was started on antibiotics for this. Since her arrival to the skilled nursing facility she has had very poor food and fluid intake had low-grade nausea without vomiting, diarrhea, constipation, dysuria, or frequency. She has not had significant reduction of cough or shortness of breath or chest discomfort. She continues to have mild tenderness in the region of the left hip surgery. She was sent to her orthopedic surgeon today for evaluation of erythema at the incision site. Dr. Meridee Score noted that she did have evidence of a surgical site infection, as well as a failure to thrive that needed inpatient evaluation. She was sent to the emergency room for initial laboratory assessment. Labs are notable for leukocytosis with hyponatremia, and imaging studies show basilar airspace disease with minimal bacteriuria. She was started on Rocephin and is admitted for further evaluation.   Hospital Course: Ms. Lanius was admitted to a medical bed. She was empirically started on IV antibiotics with vancomycin and Rocephin. Wound culture from the nursing facility subsequently returned with MRSA. With antibiotic therapy, the erythema, warmth, and dark serosanguinous drainage from her left hip have improved. She was seen a couple times by orthopedics who recommended continued IV antibiotics. No surgical management or additional imaging was recommended. Given clinical improvement she will  continue on this for a total of 14 days with close outpatient followup with orthopedics. She is to remain touchdown weightbearing status on her left leg.    On 9/19 the patient was noted to be increasingly dyspneic at rest. Given  the increased risk of PE a CT angiogram was performed and showed no evidence of pulmonary embolism. He did show large bilateral pleural effusions and pulmonary edema. This was treated with IV Lasix with rapid improvement in her respiratory status. Her proBNP on 9/19 was 4293 and improved 869 prior to discharge. Her Lasix has been decreased to 40 mg once a day due to a contraction alkalosis in the concern of volume depletion. Her renal function has remained stable. We'll need to consider discontinuation of Lasix if she is becoming volume depleted. Echocardiogram was not repeated as her last ejection fraction in 12/13 was 50%. It is felt that the pleural effusions and edema or secondary to her low albumin state in the setting of her severe protein calorie malnutrition.  She has remained in normal sinus rhythm throughout his hospitalization. She is on Lovenox for DVT prophylaxis which will continue until she is more ambulatory. Dr. Rollene Fare is recommended discontinuation of her anticoagulation in the past due to fall risk.  We'll consider Eliquis as an alternative option given the recent study comparing aspirin to Eliquis.  She has been reevaluated by physical therapy and occupational therapy who recommended continued skilled nursing facility rehabilitation. She'll be discharged to a nursing facility pending PICC line placement and bed availability.  Day of Discharge Exam BP 158/51  Pulse 62  Temp(Src) 98 F (36.7 C) (Oral)  Resp 17  Ht 5\' 3"  (1.6 m)  Wt 50.803 kg (112 lb)  BMI 19.84 kg/m2  SpO2 100%  Physical Exam: General appearance: alert and no distress Eyes: no scleral icterus Throat: oropharynx moist without erythema Resp: clear to auscultation bilaterally and  Decreased breath sounds bilateral bases Cardio: regular rate and rhythm and Grade 2/6 systolic murmur over the apex GI: soft, non-tender; bowel sounds normal; no masses,  no organomegaly Extremities: no clubbing, cyanosis or edema; left hip incision without erythema, warmth or significant drainage; tenderness persists  Discharge Labs:  Recent Labs  10/13/12 0440 10/14/12 0440  NA 135 136  K 3.5 3.5  CL 92* 93*  CO2 37* 36*  GLUCOSE 90 89  BUN 21 21  CREATININE 0.87 0.86  CALCIUM 8.6 8.7   Hepatic Function Panel     Component Value Date/Time   PROT 5.3* 10/09/2012 0425   ALBUMIN 2.8* 10/09/2012 0425   AST 49* 10/09/2012 0425   ALT 44* 10/09/2012 0425   ALKPHOS 145* 10/09/2012 0425   BILITOT 0.9 10/09/2012 0425    Recent Labs  10/13/12 0440 10/14/12 0440  WBC 5.1 4.4  HGB 10.0* 10.3*  HCT 30.7* 31.7*  MCV 104.4* 106.0*  PLT 246 238   Lab Results  Component Value Date   INR 1.07 09/23/2012   INR 1.20 09/22/2012   INR 2.02* 09/21/2012   BNP (last 3 results)  Recent Labs  10/12/12 0500 10/13/12 0440 10/14/12 0440  PROBNP 1146.0* 897.9* 869.1*  ProBNP 4293 on 9/19  Prealbumin 11.9 TSH 2.727  Wound Culture- MRSA Blood cultures- negative to date  Discharge instructions:     Discharge Orders   Future Appointments Provider Department Dept Phone   03/17/2013 2:30 PM [place] [phone]   03/17/2013 3:00 PM Vivien Rota, NP [place] [phone]   Future Orders Complete By Expires   Diet general  As directed    Discharge instructions  As directed    Comments:     Touchdown left foot weightbearing status.  Continue PT/OT.  Encourage po intake, incentive spirometry   Increase activity slowly  As directed       Disposition: To skilled nursing facility rehabilitation pending bed availability  Follow-up Appts: Follow-up with Dr. Brigitte Pulse at [facility] within  one to 2 weeks of discharge from the skilled nursing facility. Please call for appointment prior to discharge from skilled nursing facility.  Condition on Discharge: Stable  Tests Needing Follow-up: CBC and BMET weekly  Time with discharge activities: 35 minutes  Signed: Natajah Derderian,W DOUGLAS 10/14/2012, 7:14 AM

## 2012-10-14 NOTE — Progress Notes (Signed)
Peripherally Inserted Central Catheter/Midline Placement  The IV Nurse has discussed with the patient and/or persons authorized to consent for the patient, the purpose of this procedure and the potential benefits and risks involved with this procedure.  The benefits include less needle sticks, lab draws from the catheter and patient may be discharged home with the catheter.  Risks include, but not limited to, infection, bleeding, blood clot (thrombus formation), and puncture of an artery; nerve damage and irregular heat beat.  Alternatives to this procedure were also discussed.  PICC/Midline Placement Documentation        Holly Weaver 10/14/2012, 12:09 PM

## 2012-10-15 DIAGNOSIS — I10 Essential (primary) hypertension: Secondary | ICD-10-CM | POA: Diagnosis not present

## 2012-10-15 DIAGNOSIS — D500 Iron deficiency anemia secondary to blood loss (chronic): Secondary | ICD-10-CM | POA: Diagnosis not present

## 2012-10-15 DIAGNOSIS — S72143A Displaced intertrochanteric fracture of unspecified femur, initial encounter for closed fracture: Secondary | ICD-10-CM | POA: Diagnosis not present

## 2012-10-15 DIAGNOSIS — I4891 Unspecified atrial fibrillation: Secondary | ICD-10-CM | POA: Diagnosis not present

## 2012-10-16 LAB — CULTURE, BLOOD (ROUTINE X 2): Culture: NO GROWTH

## 2012-10-21 DIAGNOSIS — I509 Heart failure, unspecified: Secondary | ICD-10-CM | POA: Diagnosis not present

## 2012-10-21 DIAGNOSIS — R627 Adult failure to thrive: Secondary | ICD-10-CM | POA: Diagnosis not present

## 2012-10-21 DIAGNOSIS — L0291 Cutaneous abscess, unspecified: Secondary | ICD-10-CM | POA: Diagnosis not present

## 2012-10-30 DIAGNOSIS — I509 Heart failure, unspecified: Secondary | ICD-10-CM | POA: Diagnosis not present

## 2012-10-30 DIAGNOSIS — R627 Adult failure to thrive: Secondary | ICD-10-CM | POA: Diagnosis not present

## 2012-10-30 DIAGNOSIS — L0291 Cutaneous abscess, unspecified: Secondary | ICD-10-CM | POA: Diagnosis not present

## 2012-10-31 ENCOUNTER — Ambulatory Visit: Payer: Self-pay | Admitting: Pharmacist Clinician (PhC)/ Clinical Pharmacy Specialist

## 2012-10-31 DIAGNOSIS — I480 Paroxysmal atrial fibrillation: Secondary | ICD-10-CM

## 2012-10-31 DIAGNOSIS — Z7901 Long term (current) use of anticoagulants: Secondary | ICD-10-CM

## 2012-11-06 DIAGNOSIS — I509 Heart failure, unspecified: Secondary | ICD-10-CM | POA: Diagnosis not present

## 2012-11-06 DIAGNOSIS — R627 Adult failure to thrive: Secondary | ICD-10-CM | POA: Diagnosis not present

## 2012-11-06 DIAGNOSIS — L0291 Cutaneous abscess, unspecified: Secondary | ICD-10-CM | POA: Diagnosis not present

## 2012-11-25 ENCOUNTER — Other Ambulatory Visit: Payer: Self-pay | Admitting: Physician Assistant

## 2012-11-25 DIAGNOSIS — L0291 Cutaneous abscess, unspecified: Secondary | ICD-10-CM | POA: Diagnosis not present

## 2012-11-25 DIAGNOSIS — I509 Heart failure, unspecified: Secondary | ICD-10-CM | POA: Diagnosis not present

## 2012-11-25 DIAGNOSIS — H1045 Other chronic allergic conjunctivitis: Secondary | ICD-10-CM | POA: Diagnosis not present

## 2012-11-25 DIAGNOSIS — R627 Adult failure to thrive: Secondary | ICD-10-CM | POA: Diagnosis not present

## 2012-11-27 DIAGNOSIS — I509 Heart failure, unspecified: Secondary | ICD-10-CM | POA: Diagnosis not present

## 2012-11-27 DIAGNOSIS — H1045 Other chronic allergic conjunctivitis: Secondary | ICD-10-CM | POA: Diagnosis not present

## 2012-11-27 DIAGNOSIS — R627 Adult failure to thrive: Secondary | ICD-10-CM | POA: Diagnosis not present

## 2012-11-27 DIAGNOSIS — L0291 Cutaneous abscess, unspecified: Secondary | ICD-10-CM | POA: Diagnosis not present

## 2012-12-10 DIAGNOSIS — I10 Essential (primary) hypertension: Secondary | ICD-10-CM | POA: Diagnosis not present

## 2012-12-10 DIAGNOSIS — R0602 Shortness of breath: Secondary | ICD-10-CM | POA: Diagnosis not present

## 2012-12-10 DIAGNOSIS — I4891 Unspecified atrial fibrillation: Secondary | ICD-10-CM | POA: Diagnosis not present

## 2012-12-23 DIAGNOSIS — M6281 Muscle weakness (generalized): Secondary | ICD-10-CM | POA: Diagnosis not present

## 2012-12-23 DIAGNOSIS — IMO0001 Reserved for inherently not codable concepts without codable children: Secondary | ICD-10-CM | POA: Diagnosis not present

## 2012-12-23 DIAGNOSIS — R269 Unspecified abnormalities of gait and mobility: Secondary | ICD-10-CM | POA: Diagnosis not present

## 2012-12-23 DIAGNOSIS — S72009D Fracture of unspecified part of neck of unspecified femur, subsequent encounter for closed fracture with routine healing: Secondary | ICD-10-CM | POA: Diagnosis not present

## 2012-12-26 DIAGNOSIS — IMO0001 Reserved for inherently not codable concepts without codable children: Secondary | ICD-10-CM | POA: Diagnosis not present

## 2012-12-26 DIAGNOSIS — S72009D Fracture of unspecified part of neck of unspecified femur, subsequent encounter for closed fracture with routine healing: Secondary | ICD-10-CM | POA: Diagnosis not present

## 2012-12-30 DIAGNOSIS — IMO0001 Reserved for inherently not codable concepts without codable children: Secondary | ICD-10-CM | POA: Diagnosis not present

## 2012-12-30 DIAGNOSIS — S72009D Fracture of unspecified part of neck of unspecified femur, subsequent encounter for closed fracture with routine healing: Secondary | ICD-10-CM | POA: Diagnosis not present

## 2013-01-01 DIAGNOSIS — I10 Essential (primary) hypertension: Secondary | ICD-10-CM | POA: Diagnosis not present

## 2013-01-01 DIAGNOSIS — S72009A Fracture of unspecified part of neck of unspecified femur, initial encounter for closed fracture: Secondary | ICD-10-CM | POA: Diagnosis not present

## 2013-01-01 DIAGNOSIS — IMO0002 Reserved for concepts with insufficient information to code with codable children: Secondary | ICD-10-CM | POA: Diagnosis not present

## 2013-01-01 DIAGNOSIS — M810 Age-related osteoporosis without current pathological fracture: Secondary | ICD-10-CM | POA: Diagnosis not present

## 2013-01-01 DIAGNOSIS — I4891 Unspecified atrial fibrillation: Secondary | ICD-10-CM | POA: Diagnosis not present

## 2013-01-01 DIAGNOSIS — R0602 Shortness of breath: Secondary | ICD-10-CM | POA: Diagnosis not present

## 2013-01-02 DIAGNOSIS — IMO0001 Reserved for inherently not codable concepts without codable children: Secondary | ICD-10-CM | POA: Diagnosis not present

## 2013-01-02 DIAGNOSIS — S72009D Fracture of unspecified part of neck of unspecified femur, subsequent encounter for closed fracture with routine healing: Secondary | ICD-10-CM | POA: Diagnosis not present

## 2013-01-06 DIAGNOSIS — IMO0001 Reserved for inherently not codable concepts without codable children: Secondary | ICD-10-CM | POA: Diagnosis not present

## 2013-01-06 DIAGNOSIS — S72009D Fracture of unspecified part of neck of unspecified femur, subsequent encounter for closed fracture with routine healing: Secondary | ICD-10-CM | POA: Diagnosis not present

## 2013-01-12 DIAGNOSIS — S72009D Fracture of unspecified part of neck of unspecified femur, subsequent encounter for closed fracture with routine healing: Secondary | ICD-10-CM | POA: Diagnosis not present

## 2013-01-12 DIAGNOSIS — IMO0001 Reserved for inherently not codable concepts without codable children: Secondary | ICD-10-CM | POA: Diagnosis not present

## 2013-01-21 DIAGNOSIS — R7301 Impaired fasting glucose: Secondary | ICD-10-CM | POA: Diagnosis not present

## 2013-01-21 DIAGNOSIS — IMO0002 Reserved for concepts with insufficient information to code with codable children: Secondary | ICD-10-CM | POA: Diagnosis not present

## 2013-01-21 DIAGNOSIS — M810 Age-related osteoporosis without current pathological fracture: Secondary | ICD-10-CM | POA: Diagnosis not present

## 2013-01-21 DIAGNOSIS — I4891 Unspecified atrial fibrillation: Secondary | ICD-10-CM | POA: Diagnosis not present

## 2013-01-21 DIAGNOSIS — E785 Hyperlipidemia, unspecified: Secondary | ICD-10-CM | POA: Diagnosis not present

## 2013-01-21 DIAGNOSIS — I10 Essential (primary) hypertension: Secondary | ICD-10-CM | POA: Diagnosis not present

## 2013-01-21 DIAGNOSIS — Z23 Encounter for immunization: Secondary | ICD-10-CM | POA: Diagnosis not present

## 2013-02-02 NOTE — Progress Notes (Signed)
date of visit 10/07/2012 Holly Weaver place room 702  Patient ID: Holly Weaver, female   DOB: Feb 24, 1923, 80 y.o.   MRN: [id number]  Allergies  Allergen Reactions  . Codeine Nausea And Vomiting   Chief Complaint  Patient presents with  . Acute Visit              HPI:   80 y/o female patient is here post mechanical fall and left hip fracture for STR. She was in the hospital and underwent IM nailing by Dr Sharol Given on 09/21/12.  On examination of surgical wound, opening note to extend approximately 1.6 cm deep.  Positive for likely purulent   Appointment made for pt to see Dr. Sharol Given on 10/08/2012  Pt's appetite has been very poor.  Has been provided additional supplements with her meals and at medication passes.    Review of Systems:  Denies fever or chills Denies nausea or vomiting or abdominal pain Positive for poor appetite Positive for L leg pain Denies chest pain or SOB Denies blurry vision Denies headache Regular bowel movement No urinary complaints    Past Medical History   Diagnosis  Date   .  Breast cancer     .  Hypertension     .  Osteoporosis     .  Scoliosis     .  Atrial fibrillation     .  Compression fracture  09/21/2012   .  Cervical spondylolysis  09/21/2012   .  Osteoarthritis  09/21/2012   .  Dyslipidemia  09/21/2012   .  MVP (mitral valve prolapse)  09/23/2012      Past Surgical History   Procedure  Laterality  Date   .  Hernia repair       .  Femur im nail  Left  09/21/2012       Procedure: INTRAMEDULLARY (IM) NAIL FEMORAL;  Surgeon: Newt Minion, MD;  Location: [facility];  Service: Orthopedics;  Laterality: Left;    Social History:   reports that she has never smoked. She does not have any smokeless tobacco history on file. She reports that she does not drink alcohol or use illicit drugs.    Family History   Problem  Relation  Age of Onset   .  Cancer  Mother     .  CAD  Father     .  Cancer  Other       Medications: Patient's Medications     New Prescriptions     No medications on file   Previous Medications     ACETAMINOPHEN (TYLENOL) 325 MG TABLET     Take 325 mg by mouth every 4 (four) hours as needed for pain.     ALENDRONATE (FOSAMAX) 70 MG TABLET     Take 70 mg by mouth every 7 (seven) days. Take with a full glass of water on an empty stomach.     ALPRAZOLAM (XANAX) 0.5 MG TABLET     Take 1 tablet (0.5 mg total) by mouth every 8 (eight) hours as needed for anxiety.     AMIODARONE (PACERONE) 200 MG TABLET     Take 200 mg by mouth every morning.      ASPIRIN EC 81 MG TABLET     Take 81 mg by mouth every morning.      ATORVASTATIN (LIPITOR) 40 MG TABLET     Take 40 mg by mouth every evening.      CALCIUM-VITAMIN D (OSCAL WITH  D) 500-200 MG-UNIT PER TABLET     Take 1 tablet by mouth every morning.      FEEDING SUPPLEMENT (ENSURE COMPLETE) LIQD     Take 237 mLs by mouth 2 (two) times daily between meals.     LETROZOLE (FEMARA) 2.5 MG TABLET     Take 2.5 mg by mouth every morning.     METOPROLOL (LOPRESSOR) 100 MG TABLET     Take 100 mg by mouth 2 (two) times daily.     MULTIPLE VITAMIN (MULTIVITAMIN WITH MINERALS) TABS     Take 1 tablet by mouth every morning. Centrum 50+     OMEPRAZOLE (PRILOSEC) 20 MG CAPSULE     Take 20 mg by mouth daily as needed (for acid reduction).      POLYETHYLENE GLYCOL (MIRALAX / GLYCOLAX) PACKET     Take 17 g by mouth every morning.      TRAMADOL (ULTRAM) 50 MG TABLET     Take 1 tablet (50 mg total) by mouth every 8 (eight) hours as needed for pain.     VITAMIN D, ERGOCALCIFEROL, (DRISDOL) 50000 UNITS CAPS     Take 50,000 Units by mouth every 7 (seven) days.   Modified Medications     No medications on file   Discontinued Medications     No medications on file      Physical Exam: Filed Vitals:     [id number]  BP:  12862  Pulse:  64  Temp:  98.1  Resp:  16   SpO2:  96%   gen- elderly frail lady in NAD HEENT- mild pallor, no icterus, no LAD, MMM, PERRLA, eomi CVS- irregular heart rate, no  new murmur, no rubs/ gallop respi- CTAB, no wheeze or rhonchi abdo- bs+, soft, non tenderNeuro- aao x 3, no focal deficit L leg, with dressing removed, opening noted at edge of incision line, with total depth of 1.6-2 cm.  Painful with any manipulation.  ? Of possible purulent drainage.   Psych- mood and affect appropriate  Labs reviewed: Basic Metabolic Panel:   Recent Labs   09/22/12 0415  09/23/12 0404  09/24/12 0436   NA  128*  133*  131*   K  4.0  4.1  3.9   CL  93*  99  98   CO2  26  30  31    GLUCOSE  187*  133*  112*   BUN  15  14  14    CREATININE  0.80  0.83  0.69   CALCIUM  8.2*  7.9*  7.9*    Liver Function Tests:   Recent Labs   09/22/12 0415  09/23/12 0404  09/24/12 0436   AST  35  43*  41*   ALT  26  22  25    ALKPHOS  45  48  55   BILITOT  0.7  0.9  0.9   PROT  5.2*  5.2*  5.1*   ALBUMIN  2.9*  2.9*  2.6*    CBC:   Recent Labs   09/21/12 0233  09/22/12 0805  09/22/12 0900  09/22/12 1946  09/23/12 0404  09/24/12 0436   WBC  12.8*  11.1*  12.9*  16.3*  13.7*  10.1   NEUTROABS  10.9*  9.8*  11.4*   --    --    --    HGB  11.6*  5.9*  6.3*  10.0*  8.8*  8.6*   HCT  34.1*  17.0*  18.2*  28.0*  24.8*  25.8*   MCV  100.3*  98.8  100.0  94.3  93.6  97.7   PLT  177  128*  132*  126*  104*  123*     09/26/2012 WBC 7.3 RBC 2.6 Hemoglobin 8.4 Hematocrit 26.4 MCV 102.7 MCH 32.7 MCHC 31.8 RDW 15.8 Platelets 193 Lymphocyte percentage 11.0 lymphocytes #0.9 Other differential within normal limits.  Sodium is 131 Potassium 4 [place] 95 CO2 31 AGAP 5 Glucose 113 BUN 15 Creatinine 0.7 Calcium 8.4 Total protein 4.9 Albumin 3.1 GLOB 1.8 Total bilirubin 1.8 ALP 2 AST 36 ALT 26   10/07/2012 WBC 9.2 RBC 3.2 Hemoglobin 11 Hematocrit 35 line MCV 108 MCH 34 MCHC 31.4 RDW 18.8 Platelets 345 Neutrophil percentage 81.9 Lymphocyte percentage 7.3 Neutrophil #7.5 Lymphocyte #0.7    other differential within normal limits.  Test result  back on 10/08/2012, wound culture showing heavy growth of MRSA.    Radiological Exams: Dg Chest 1 View  09/21/2012   *RADIOLOGY REPORT*  Clinical Data: Fall with hip pain.  Preoperative chest x-ray.  CHEST - 1 VIEW  Comparison: 07/06/2012.  Findings: Chronic cardiomegaly.  Unchanged upper mediastinal contours.  Hiatal hernia, at least moderate in size.  Aortic atherosclerosis.  Hyperinflated lungs.  No acute infiltrate, edema, effusion, pneumothorax.  Osteopenia.  S-shaped scoliosis.  Two previous lower thoracic vertebral body compression fractures with bone augmentation.  IMPRESSION: 1. No evidence of acute cardiopulmonary disease. 2.  Chronic cardiomegaly. 3.  Probable COPD. 4.  Moderate hiatal hernia.   Original Report Authenticated By: Jorje Guild   Dg Hip Complete Left  09/21/2012   *RADIOLOGY REPORT*  Clinical Data: Fall with hip pain.  LEFT HIP - COMPLETE 2+ VIEW  Comparison: None.  Findings: Intertrochanteric left femur fracture with varus angulation.  The femoral head remains located.  As permitted by overlapping bowel gas, no acute pelvic ring fracture identified.  Severe osteopenia.  Lower lumbar degenerative disc disease.  IMPRESSION: Acute intertrochanteric left femur fracture with varus angulation.   Original Report Authenticated By: Jorje Guild   Dg Chest Port 1 View  09/22/2012   *RADIOLOGY REPORT*  Clinical Data: Leukocytosis.  PORTABLE CHEST - 1 VIEW  Comparison: Portable chest 09/21/2012 and 01/10/2010.  Findings: The patient is rotated on the study.  Small focus airspace opacity is seen in the left lung base.  The right lung is clear.  There is cardiomegaly.  Hiatal hernia is identified.  IMPRESSION:  1.  Small focus of left basilar airspace opacity has an appearance most compatible with atelectasis rather than pneumonia. 2.  Cardiomegaly without edema. 3.  Hiatal hernia.   Original Report Authenticated By: Orlean Patten, M.D.      Assessment/Plan: Appointment made for pt to  see Dr. Sharol Given on 10/08/2012.  Culture taken after wound thoroughly cleaned with NS.  Pt has been afebrile.  Would prefer to defer antibiotics until culture results available.  Hopefully should be available for her orthopedist.  Would anticipate pt will need incising of open area to explore opening, any debridement, and access for placement of packing.  Possible may have to have surgical hardware removed.    Would anticipate returning to facility after any necessary procedure.

## 2013-03-16 ENCOUNTER — Other Ambulatory Visit: Payer: Self-pay | Admitting: *Deleted

## 2013-03-16 DIAGNOSIS — C50919 Malignant neoplasm of unspecified site of unspecified female breast: Secondary | ICD-10-CM

## 2013-03-17 ENCOUNTER — Other Ambulatory Visit: Payer: Medicare Other

## 2013-03-17 ENCOUNTER — Ambulatory Visit: Payer: Medicare Other | Admitting: Family

## 2013-03-17 ENCOUNTER — Ambulatory Visit: Payer: Medicare Other | Admitting: Oncology

## 2013-03-23 ENCOUNTER — Telehealth: Payer: Self-pay | Admitting: *Deleted

## 2013-03-23 ENCOUNTER — Other Ambulatory Visit: Payer: Self-pay | Admitting: Oncology

## 2013-03-23 DIAGNOSIS — Z853 Personal history of malignant neoplasm of breast: Secondary | ICD-10-CM

## 2013-03-23 NOTE — Telephone Encounter (Signed)
Pt called for appt gv appt for 05/26/13 w/ labs@ 3p and ov@ 3:30pm. Pt is aware...td

## 2013-03-24 ENCOUNTER — Telehealth: Payer: Self-pay | Admitting: *Deleted

## 2013-03-24 NOTE — Telephone Encounter (Signed)
Patient called to confirm her appts for May. I have given the patient the appts.  JMW

## 2013-04-13 ENCOUNTER — Other Ambulatory Visit: Payer: Self-pay | Admitting: Oncology

## 2013-04-22 DIAGNOSIS — R609 Edema, unspecified: Secondary | ICD-10-CM | POA: Diagnosis not present

## 2013-04-22 DIAGNOSIS — I2510 Atherosclerotic heart disease of native coronary artery without angina pectoris: Secondary | ICD-10-CM | POA: Diagnosis not present

## 2013-04-22 DIAGNOSIS — I4891 Unspecified atrial fibrillation: Secondary | ICD-10-CM | POA: Diagnosis not present

## 2013-04-22 DIAGNOSIS — I10 Essential (primary) hypertension: Secondary | ICD-10-CM | POA: Diagnosis not present

## 2013-04-22 DIAGNOSIS — M810 Age-related osteoporosis without current pathological fracture: Secondary | ICD-10-CM | POA: Diagnosis not present

## 2013-04-22 DIAGNOSIS — E785 Hyperlipidemia, unspecified: Secondary | ICD-10-CM | POA: Diagnosis not present

## 2013-04-22 DIAGNOSIS — R7301 Impaired fasting glucose: Secondary | ICD-10-CM | POA: Diagnosis not present

## 2013-04-22 DIAGNOSIS — Z79899 Other long term (current) drug therapy: Secondary | ICD-10-CM | POA: Diagnosis not present

## 2013-04-27 DIAGNOSIS — R609 Edema, unspecified: Secondary | ICD-10-CM | POA: Diagnosis not present

## 2013-05-04 ENCOUNTER — Ambulatory Visit
Admission: RE | Admit: 2013-05-04 | Discharge: 2013-05-04 | Disposition: A | Discharge status: Home / Self Care | Payer: Medicare Other | Source: Ambulatory Visit | Attending: Oncology | Admitting: Oncology

## 2013-05-04 DIAGNOSIS — C50919 Malignant neoplasm of unspecified site of unspecified female breast: Secondary | ICD-10-CM | POA: Diagnosis not present

## 2013-05-04 DIAGNOSIS — Z853 Personal history of malignant neoplasm of breast: Secondary | ICD-10-CM

## 2013-05-26 ENCOUNTER — Ambulatory Visit (HOSPITAL_BASED_OUTPATIENT_CLINIC_OR_DEPARTMENT_OTHER): Payer: Medicare Other | Admitting: Oncology

## 2013-05-26 ENCOUNTER — Other Ambulatory Visit (HOSPITAL_BASED_OUTPATIENT_CLINIC_OR_DEPARTMENT_OTHER): Payer: Medicare Other

## 2013-05-26 ENCOUNTER — Telehealth: Payer: Self-pay | Admitting: Oncology

## 2013-05-26 VITALS — BP 167/56 | HR 59 | Temp 98.4°F | Resp 20 | Ht 63.0 in | Wt 116.3 lb

## 2013-05-26 DIAGNOSIS — C50919 Malignant neoplasm of unspecified site of unspecified female breast: Secondary | ICD-10-CM

## 2013-05-26 DIAGNOSIS — C50412 Malignant neoplasm of upper-outer quadrant of left female breast: Secondary | ICD-10-CM | POA: Insufficient documentation

## 2013-05-26 DIAGNOSIS — C50419 Malignant neoplasm of upper-outer quadrant of unspecified female breast: Secondary | ICD-10-CM

## 2013-05-26 DIAGNOSIS — C50912 Malignant neoplasm of unspecified site of left female breast: Secondary | ICD-10-CM

## 2013-05-26 DIAGNOSIS — Z170 Estrogen receptor positive status [ER+]: Secondary | ICD-10-CM | POA: Diagnosis not present

## 2013-05-26 LAB — COMPREHENSIVE METABOLIC PANEL (CC13)
ALT: 19 U/L (ref 0–55)
AST: 28 U/L (ref 5–34)
Albumin: 4 g/dL (ref 3.5–5.0)
Alkaline Phosphatase: 85 U/L (ref 40–150)
Anion Gap: 10 mEq/L (ref 3–11)
BILIRUBIN TOTAL: 0.72 mg/dL (ref 0.20–1.20)
BUN: 29.3 mg/dL — AB (ref 7.0–26.0)
CHLORIDE: 101 meq/L (ref 98–109)
CO2: 33 mEq/L — ABNORMAL HIGH (ref 22–29)
Calcium: 10 mg/dL (ref 8.4–10.4)
Creatinine: 1.5 mg/dL — ABNORMAL HIGH (ref 0.6–1.1)
GLUCOSE: 109 mg/dL (ref 70–140)
Potassium: 4.6 mEq/L (ref 3.5–5.1)
Sodium: 143 mEq/L (ref 136–145)
Total Protein: 7.1 g/dL (ref 6.4–8.3)

## 2013-05-26 LAB — CBC WITH DIFFERENTIAL/PLATELET
BASO%: 0.9 % (ref 0.0–2.0)
BASOS ABS: 0.1 10*3/uL (ref 0.0–0.1)
EOS ABS: 0.3 10*3/uL (ref 0.0–0.5)
EOS%: 5.3 % (ref 0.0–7.0)
HEMATOCRIT: 40 % (ref 34.8–46.6)
HEMOGLOBIN: 13 g/dL (ref 11.6–15.9)
LYMPH#: 1.2 10*3/uL (ref 0.9–3.3)
LYMPH%: 17.9 % (ref 14.0–49.7)
MCH: 34.4 pg — ABNORMAL HIGH (ref 25.1–34.0)
MCHC: 32.6 g/dL (ref 31.5–36.0)
MCV: 105.4 fL — ABNORMAL HIGH (ref 79.5–101.0)
MONO#: 0.5 10*3/uL (ref 0.1–0.9)
MONO%: 7.5 % (ref 0.0–14.0)
NEUT#: 4.5 10*3/uL (ref 1.5–6.5)
NEUT%: 68.4 % (ref 38.4–76.8)
PLATELETS: 181 10*3/uL (ref 145–400)
RBC: 3.8 10*6/uL (ref 3.70–5.45)
RDW: 14.1 % (ref 11.2–14.5)
WBC: 6.5 10*3/uL (ref 3.9–10.3)

## 2013-05-26 MED ORDER — LETROZOLE 2.5 MG PO TABS: 2.5000 mg | ORAL_TABLET | Freq: Every day | ORAL | Status: DC

## 2013-05-26 NOTE — Progress Notes (Signed)
Holly Weaver  [phone] [phone]     ID: Holly Weaver OB: May 09, 1923  MR#: [id number]  XBM#:[id number]  PCP: [name], MD GYN:   SU:  OTHER MD: Meridee Score  CHIEF COMPLAINT: Breast cancer/followup  BREAST CANCER HISTORY: From Dr. Collier [name]'s intake note dated 03/28/2005  Ms. [name] only had two mammograms in her lifetime.  She noted some change in her left breast, but only about a year ago.  She did undergo a mammogram on 02/16/2005, which showed a possible mass in the left breast.  Left diagnostic mammogram and ultrasound was performed on 03/07/2005, this showed a spiculated mass in the left breast.  Core biopsy was recommended.  Sonographically, this was a hypoechoic mass measuring 1.9 x 1.9 cm at the 2 o'clock position approximately 3.0 cm from the nipple.  Biopsy took place on 03/07/2005, which showed invasive mammary carcinoma intermediate grade.  This is strongly ER and PR positive 95% and 13% respectively.  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  The patient's subsequent history is as detailed below  INTERVAL HISTORY: Holly Weaver was evaluated in the breast clinic on 05/26/2013 accompanied by her daughter [name]. Since her last visit here the patient fell out of bed and fractured her left hip. She had left hip replacement  And then spend 2 months in rehabilitation. She is now back at home. She continues on letrozole with excellent tolerance.  REVIEW OF SYSTEMS: Sury denies any unusual hot flashes, vaginal dryness, or worsening arthralgias or myalgias. She has seasonal allergies. Sometimes her ankles swell. She does have back pain which is chronic and not more intense or frequent than before. Otherwise a detailed review of systems according to her and her daughter's report is entirely  stable  PAST MEDICAL HISTORY: Past Medical History  Diagnosis Date  . Hypertension   . Osteoporosis   . Scoliosis   . Atrial fibrillation   . Compression fracture 09/21/2012  . Dyslipidemia 09/21/2012  . MVP (mitral valve prolapse) 09/23/2012  . Breast cancer ~ 2006    "left" (10/09/2012)  . Heart murmur   . Shortness of breath     "just now; nose is dry" (10/09/2012)  . History of blood transfusion     "years and years ago; I was anemic" (10/09/2012)  . Cervical spondylolysis 09/21/2012  . Osteoarthritis 09/21/2012  . Arthritis     "hands and legs" (10/09/2012)  . Chronic lower back pain     PAST SURGICAL HISTORY: Past Surgical History  Procedure Laterality Date  . Femur im nail Left 09/21/2012    Procedure: INTRAMEDULLARY (IM) NAIL FEMORAL;  Surgeon: Newt Minion, MD;  Location: [facility];  Service: Orthopedics;  Laterality: Left;  . Hernia repair  1950's?  . Cataract extraction w/ intraocular lens implant Left 2000's  . Breast biopsy Left ~ 2006    FAMILY HISTORY Family History  Problem Relation Age of Onset  . Cancer Mother   . CAD Father   . Cancer Other    the patient's father died at the age of 48 from postoperative complications. The patient's mother died at the age of 66 with what sounds like lung cancer although the patient stated sinus cancer. The patient had 2 brothers, no sisters. There is no history of breast or ovarian cancer in the immediate family.  GYNECOLOGIC  HISTORY:  Menarche age 75, first live birth age 51, the patient is [place] P2. She went to the change of life in her 21s. Gender took hormone replacement.  SOCIAL HISTORY:  Holly Weaver used to works for [employer]. She is now retired. She is separated from her husband and he subsequently died approximately 7 years ago. The patient lives alone, with no pets, but her daughter Holly Weaver is there just about every day. [name] used to work in [employer] under[employer] but is now the caregiver primarily. The patient's son had a brain  stem injury from a motorcycle accident and cannot walk. He is married and lives independently. The patient has 4 grandchildren and 5 great-grandchildren. She is a Tourist information centre manager    ADVANCED DIRECTIVES: In place; the patient's daughter Holly Weaver is healthcare power of attorney   HEALTH MAINTENANCE: History  Substance Use Topics  . Smoking status: Never Smoker   . Smokeless tobacco: Never Used  . Alcohol Use: No     Colonoscopy:  PAP:  Bone density:  Lipid panel:  Allergies  Allergen Reactions  . Codeine Nausea And Vomiting    Current Outpatient Prescriptions  Medication Sig Dispense Refill  . acetaminophen (TYLENOL) 325 MG tablet Take 325 mg by mouth every 4 (four) hours as needed for pain.      Marland Kitchen alendronate (FOSAMAX) 70 MG tablet Take 70 mg by mouth every 7 (seven) days. Take with a full glass of water on an empty stomach.      . ALPRAZolam (XANAX) 0.25 MG tablet Take one tablet by mouth every 12 hours as needed for severe anxiety  60 tablet  5  . amiodarone (PACERONE) 200 MG tablet Take 200 mg by mouth every morning.       Marland Kitchen aspirin EC 81 MG tablet Take 81 mg by mouth every morning.       Marland Kitchen atorvastatin (LIPITOR) 40 MG tablet Take 40 mg by mouth every evening.       . calcium-vitamin D (OSCAL WITH D) 500-200 MG-UNIT per tablet Take 1 tablet by mouth every morning.       . enoxaparin (LOVENOX) 40 MG/0.4ML injection Inject 0.4 mLs (40 mg total) into the skin daily.  0 Syringe    . feeding supplement (ENSURE COMPLETE) LIQD Take 237 mLs by mouth 2 (two) times daily between meals.      . ferrous gluconate (FERGON) 325 MG tablet Take 325 mg by mouth 2 (two) times daily.      . furosemide (LASIX) 40 MG tablet Take 1 tablet (40 mg total) by mouth daily.  30 tablet  0  . HYDROcodone-acetaminophen (NORCO) 7.5-325 MG per tablet Take 1 tablet by mouth every 6 (six) hours as needed for pain.      Marland Kitchen letrozole (FEMARA) 2.5 MG tablet TAKE 1 TABLET BY MOUTH EVERY DAY  90 tablet  0  .  metoprolol (LOPRESSOR) 100 MG tablet Take 100 mg by mouth 2 (two) times daily.      . mirtazapine (REMERON) 15 MG tablet Take 1 tablet (15 mg total) by mouth at bedtime.  30 tablet  6  . Multiple Vitamin (MULTIVITAMIN WITH MINERALS) TABS Take 1 tablet by mouth every morning. Centrum 50+      . omeprazole (PRILOSEC) 20 MG capsule Take 20 mg by mouth daily as needed (for acid reduction).       . ondansetron (ZOFRAN) 4 MG tablet Take 1 tablet (4 mg total) by mouth every 6 (six) hours as needed for  nausea.  20 tablet  0  . polyethylene glycol (MIRALAX / GLYCOLAX) packet Take 17 g by mouth every morning.       . senna-docusate (SENOKOT-S) 8.6-50 MG per tablet Take 1 tablet by mouth 2 (two) times daily.      . traMADol (ULTRAM) 50 MG tablet Take 1 tablet (50 mg total) by mouth every 8 (eight) hours as needed for pain.  50 tablet  0  . traZODone (DESYREL) 50 MG tablet Take 50 mg by mouth every 12 (twelve) hours as needed for sleep.      . Vitamin D, Ergocalciferol, (DRISDOL) 50000 UNITS CAPS Take 50,000 Units by mouth every 7 (seven) days.       No current facility-administered medications for this visit.    OBJECTIVE: Elderly white woman who appears stated age 12 Vitals:   05/26/13 1448  BP: 167/56  Pulse: 59  Temp: 98.4 F (36.9 C)  Resp: 20     Body mass index is 20.61 kg/(m^2).    ECOG FS:2 - Symptomatic, <50% confined to bed  Ocular: Sclerae unicteric, EOMs intact Ear-nose-throat: Oropharynx clear and moist entition fair Lymphatic: No cervical or supraclavicular adenopathy Lungs no rales or rhonchi Heart regular rate and rhythm Abd soft, nontender, positive bowel sounds MSK no focal spinal tenderness, minimal bilateral ankle edema Neuro: non-focal, well-oriented, positive affect Breasts: The right breast is unremarkable. The left breast is status post remote biopsy. There is no evidence of local recurrence. I do not palpate any suspicious masses and there is no nipple change of  concern. The left axilla is benign.   LAB RESULTS:  CMP     Component Value Date/Time   NA 136 10/14/2012 0440   K 3.5 10/14/2012 0440   CL 93* 10/14/2012 0440   CO2 36* 10/14/2012 0440   GLUCOSE 89 10/14/2012 0440   BUN 21 10/14/2012 0440   CREATININE 0.86 10/14/2012 0440   CALCIUM 8.7 10/14/2012 0440   PROT 5.3* 10/09/2012 0425   ALBUMIN 2.8* 10/09/2012 0425   AST 49* 10/09/2012 0425   ALT 44* 10/09/2012 0425   ALKPHOS 145* 10/09/2012 0425   BILITOT 0.9 10/09/2012 0425   GFRNONAA 59* 10/14/2012 0440   GFRAA 68* 10/14/2012 0440    I No results found for this basename: SPEP, UPEP,  kappa and lambda light chains    Lab Results  Component Value Date   WBC 6.5 05/26/2013   NEUTROABS 4.5 05/26/2013   HGB 13.0 05/26/2013   HCT 40.0 05/26/2013   MCV 105.4* 05/26/2013   PLT 181 05/26/2013      Chemistry      Component Value Date/Time   NA 136 10/14/2012 0440   K 3.5 10/14/2012 0440   CL 93* 10/14/2012 0440   CO2 36* 10/14/2012 0440   BUN 21 10/14/2012 0440   CREATININE 0.86 10/14/2012 0440      Component Value Date/Time   CALCIUM 8.7 10/14/2012 0440   ALKPHOS 145* 10/09/2012 0425   AST 49* 10/09/2012 0425   ALT 44* 10/09/2012 0425   BILITOT 0.9 10/09/2012 0425       Lab Results  Component Value Date   LABCA2 16 04/14/2010    No components found with this basename: [id number]    No results found for this basename: INR,  in the last 168 hours  Urinalysis    Component Value Date/Time   COLORURINE AMBER* 10/08/2012 1820   APPEARANCEUR HAZY* 10/08/2012 1820   LABSPEC 1.026 10/08/2012 [place]  6.0 10/08/2012 1820   GLUCOSEU NEGATIVE 10/08/2012 1820   HGBUR NEGATIVE 10/08/2012 1820   BILIRUBINUR SMALL* 10/08/2012 1820   KETONESUR 15* 10/08/2012 1820   PROTEINUR NEGATIVE 10/08/2012 1820   UROBILINOGEN 1.0 10/08/2012 1820   NITRITE NEGATIVE 10/08/2012 1820   LEUKOCYTESUR MODERATE* 10/08/2012 1820    STUDIES: Mm Diag Breast Tomo Bilateral  05/04/2013   CLINICAL DATA:  The patient has a  history of left breast invasive mammary carcinoma diagnosed in 2007. Due to her clinical condition, she was unable to undergo surgery and has been treated with Femara. Followup evaluation.  EXAM: DIGITAL DIAGNOSTIC  bilateral MAMMOGRAM WITH CAD  DIGITAL BREAST TOMOSYNTHESIS  Digital breast tomosynthesis images are acquired in two projections. These images are reviewed in combination with the digital mammogram, confirming the findings below.  COMPARISON:  Previous examinations the most recent of which is dated 05/02/2012.  ACR Breast Density Category c: The breast tissue is heterogeneously dense, which may obscure small masses.  FINDINGS: There is a ribbon shaped clip located within the upper outer quadrant of the left breast related to the patient's previous ultrasound-guided core biopsy. There is distortion present which is stable when compare with the prior studies. There is no new mass and there are no worrisome calcifications within either breast. Senile vascular calcification is present bilaterally.  Mammographic images were processed with CAD.  IMPRESSION: Stable appearance of the breasts with the known invasive mammary carcinoma within the upper outer quadrant left breast.  RECOMMENDATION: Bilateral diagnostic mammography in 1 year.  I have discussed the findings and recommendations with the patient. Results were also provided in writing at the conclusion of the visit. If applicable, a reminder letter will be sent to the patient regarding the next appointment.  BI-RADS CATEGORY  6: Known biopsy-proven malignancy - appropriate action should be taken.   Electronically Signed   By: Luberta Robertson M.D.   On: 05/04/2013 13:56    ASSESSMENT: 80 y.o. s/p left breast upper outer quadrant biopsy 03/07/2005 for a clinical T2 N0, stage IIA invasive ductal carcinoma, grade 2, estrogen receptor 95% positive, progesterone receptor 13% positive, HER-2 nonamplified  (1) the patient opted to forgo surgery and radiation    (2) on letrozole since March of 2007.  PLAN: We reviewed [place] situation and going all the way back to her original biopsy in February of 2007. She has had a wonderful response to Femara and at present there is no measurable mass in the left breast. The plan is going to be to continue for marrow indefinitely and I went ahead and refill her prescription today. She is receiving alendronate for her primary care physician.  The patient has a good understanding of the overall plan. She agrees with it. She knows a goal of treatment in her case is control. She will call with any problems that may develop before next visit here, which will be in one year, after her April mammogram  [name], MD   05/26/2013 3:11 PM

## 2013-05-26 NOTE — Addendum Note (Signed)
Addended by: Laureen Abrahams on: 05/26/2013 06:29 PM   Modules accepted: Orders, Medications

## 2013-05-26 NOTE — Telephone Encounter (Signed)
, °

## 2013-06-03 ENCOUNTER — Other Ambulatory Visit: Payer: Self-pay | Admitting: Dermatology

## 2013-06-03 DIAGNOSIS — L821 Other seborrheic keratosis: Secondary | ICD-10-CM | POA: Diagnosis not present

## 2013-06-03 DIAGNOSIS — C44611 Basal cell carcinoma of skin of unspecified upper limb, including shoulder: Secondary | ICD-10-CM | POA: Diagnosis not present

## 2013-06-03 DIAGNOSIS — Z85828 Personal history of other malignant neoplasm of skin: Secondary | ICD-10-CM | POA: Diagnosis not present

## 2013-06-03 DIAGNOSIS — L723 Sebaceous cyst: Secondary | ICD-10-CM | POA: Diagnosis not present

## 2013-10-21 DIAGNOSIS — R05 Cough: Secondary | ICD-10-CM | POA: Diagnosis not present

## 2013-10-21 DIAGNOSIS — R059 Cough, unspecified: Secondary | ICD-10-CM | POA: Diagnosis not present

## 2013-10-21 DIAGNOSIS — M439 Deforming dorsopathy, unspecified: Secondary | ICD-10-CM | POA: Diagnosis not present

## 2013-10-21 DIAGNOSIS — K449 Diaphragmatic hernia without obstruction or gangrene: Secondary | ICD-10-CM | POA: Diagnosis not present

## 2013-10-21 DIAGNOSIS — M546 Pain in thoracic spine: Secondary | ICD-10-CM | POA: Diagnosis not present

## 2013-10-21 DIAGNOSIS — M949 Disorder of cartilage, unspecified: Secondary | ICD-10-CM | POA: Diagnosis not present

## 2013-10-21 DIAGNOSIS — R609 Edema, unspecified: Secondary | ICD-10-CM | POA: Diagnosis not present

## 2013-10-21 DIAGNOSIS — I4891 Unspecified atrial fibrillation: Secondary | ICD-10-CM | POA: Diagnosis not present

## 2013-10-21 DIAGNOSIS — R7301 Impaired fasting glucose: Secondary | ICD-10-CM | POA: Diagnosis not present

## 2013-10-21 DIAGNOSIS — J449 Chronic obstructive pulmonary disease, unspecified: Secondary | ICD-10-CM | POA: Diagnosis not present

## 2013-10-21 DIAGNOSIS — J984 Other disorders of lung: Secondary | ICD-10-CM | POA: Diagnosis not present

## 2013-10-21 DIAGNOSIS — Z1331 Encounter for screening for depression: Secondary | ICD-10-CM | POA: Diagnosis not present

## 2013-10-21 DIAGNOSIS — I10 Essential (primary) hypertension: Secondary | ICD-10-CM | POA: Diagnosis not present

## 2013-10-21 DIAGNOSIS — M810 Age-related osteoporosis without current pathological fracture: Secondary | ICD-10-CM | POA: Diagnosis not present

## 2013-10-21 DIAGNOSIS — Z23 Encounter for immunization: Secondary | ICD-10-CM | POA: Diagnosis not present

## 2013-10-21 DIAGNOSIS — M899 Disorder of bone, unspecified: Secondary | ICD-10-CM | POA: Diagnosis not present

## 2013-12-01 DIAGNOSIS — Z6822 Body mass index (BMI) 22.0-22.9, adult: Secondary | ICD-10-CM | POA: Diagnosis not present

## 2013-12-01 DIAGNOSIS — M810 Age-related osteoporosis without current pathological fracture: Secondary | ICD-10-CM | POA: Diagnosis not present

## 2014-02-24 DIAGNOSIS — M810 Age-related osteoporosis without current pathological fracture: Secondary | ICD-10-CM | POA: Diagnosis not present

## 2014-02-24 DIAGNOSIS — C50919 Malignant neoplasm of unspecified site of unspecified female breast: Secondary | ICD-10-CM | POA: Diagnosis not present

## 2014-02-24 DIAGNOSIS — I480 Paroxysmal atrial fibrillation: Secondary | ICD-10-CM | POA: Diagnosis not present

## 2014-02-24 DIAGNOSIS — E785 Hyperlipidemia, unspecified: Secondary | ICD-10-CM | POA: Diagnosis not present

## 2014-02-24 DIAGNOSIS — I10 Essential (primary) hypertension: Secondary | ICD-10-CM | POA: Diagnosis not present

## 2014-02-24 DIAGNOSIS — I2510 Atherosclerotic heart disease of native coronary artery without angina pectoris: Secondary | ICD-10-CM | POA: Diagnosis not present

## 2014-02-24 DIAGNOSIS — Z6822 Body mass index (BMI) 22.0-22.9, adult: Secondary | ICD-10-CM | POA: Diagnosis not present

## 2014-02-24 DIAGNOSIS — R7301 Impaired fasting glucose: Secondary | ICD-10-CM | POA: Diagnosis not present

## 2014-02-26 DIAGNOSIS — M810 Age-related osteoporosis without current pathological fracture: Secondary | ICD-10-CM | POA: Diagnosis not present

## 2014-02-26 DIAGNOSIS — I480 Paroxysmal atrial fibrillation: Secondary | ICD-10-CM | POA: Diagnosis not present

## 2014-02-26 DIAGNOSIS — I10 Essential (primary) hypertension: Secondary | ICD-10-CM | POA: Diagnosis not present

## 2014-02-26 DIAGNOSIS — Z008 Encounter for other general examination: Secondary | ICD-10-CM | POA: Diagnosis not present

## 2014-03-01 IMAGING — CT CT ANGIO CHEST
2 of 7 series · 18 of 36 positions shown · IV contrast (omnipaque)
Comparison: PA and lateral chest 10/08/2012.

CLINICAL DATA: Dyspnea. History of breast cancer

EXAM:
CT ANGIOGRAPHY CHEST WITH CONTRAST
TECHNIQUE: Multidetector CT imaging of the chest was performed using the
standard protocol during bolus administration of intravenous
contrast. Multiplanar CT image reconstructions including MIPs were
obtained to evaluate the vascular anatomy.
CONTRAST:  80mL OMNIPAQUE IOHEXOL 350 MG/ML SOLN

[Series 5: pe thins · axial · 0.68mm/px · z∈[-235,+10]mm · 17 of 277 slices shown]
[im 16/277  lung]
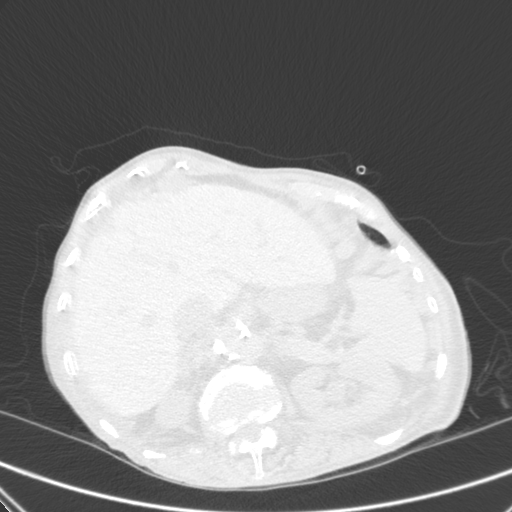
[im 31/277  mediastinal]
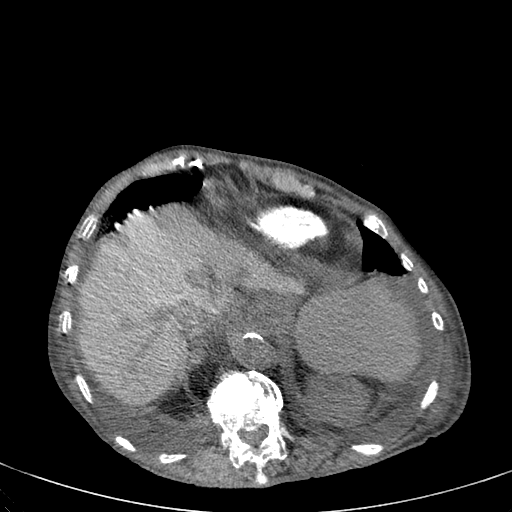
[im 47/277  lung]
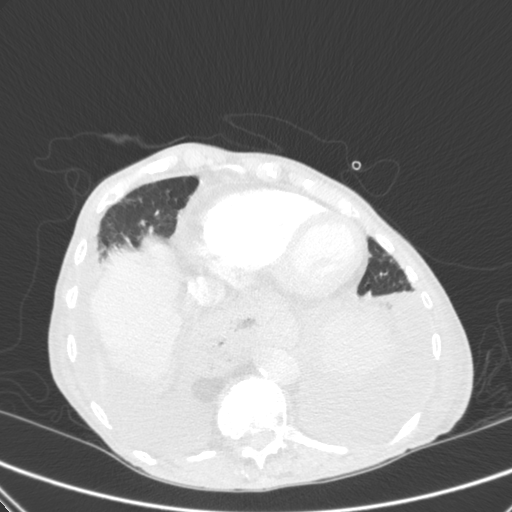
[im 62/277  mediastinal]
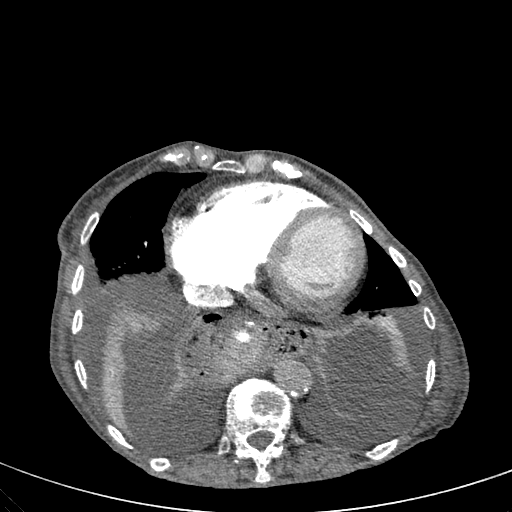
[im 77/277  lung]
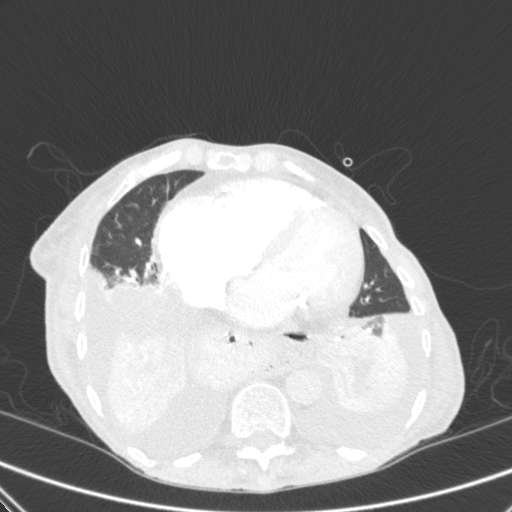
[im 93/277  mediastinal]
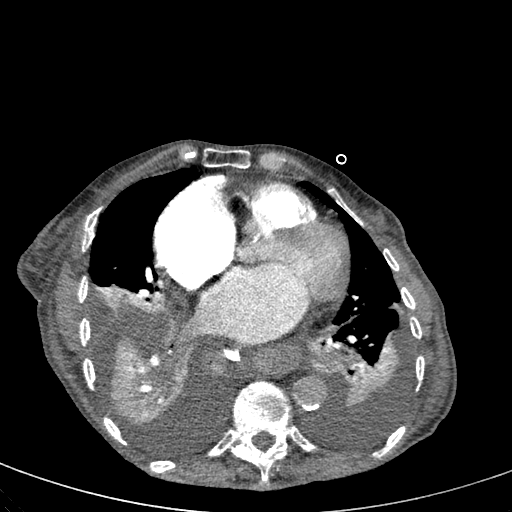
[im 108/277  lung]
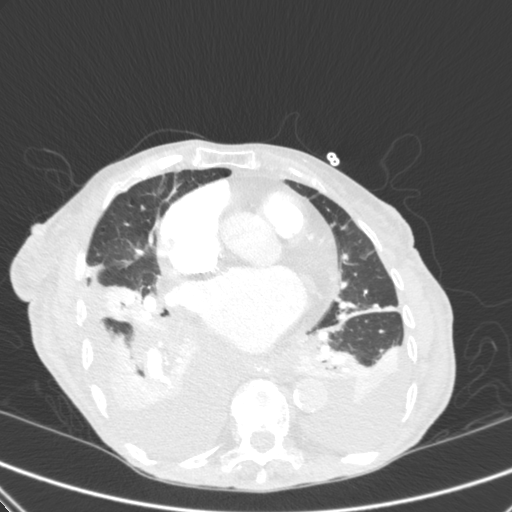
[im 123/277  mediastinal]
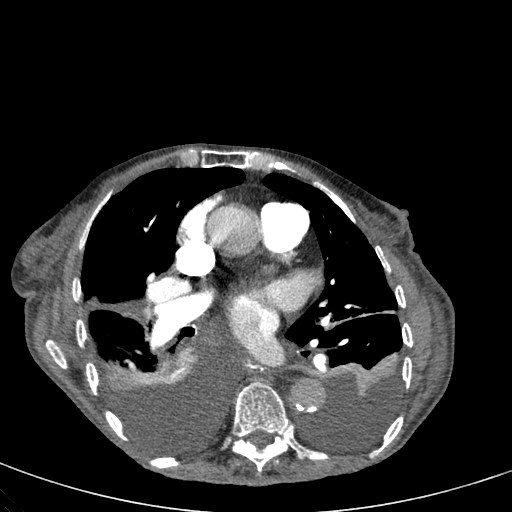
[im 139/277  lung]
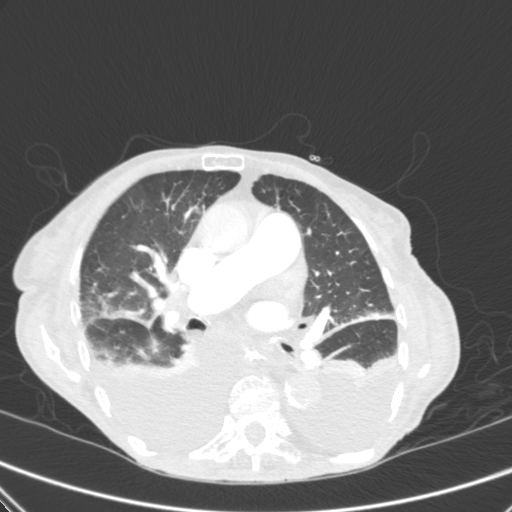
[im 154/277  mediastinal]
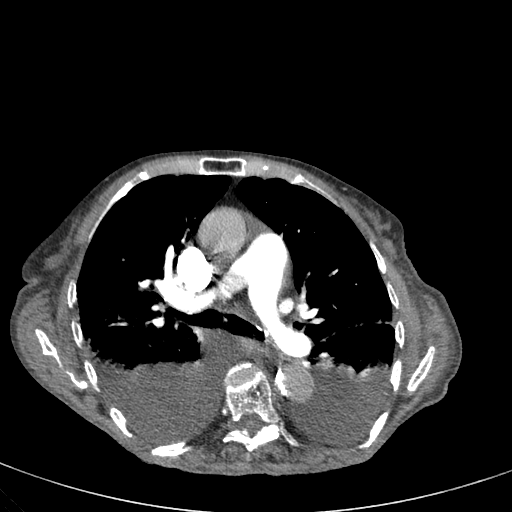
[im 169/277  lung]
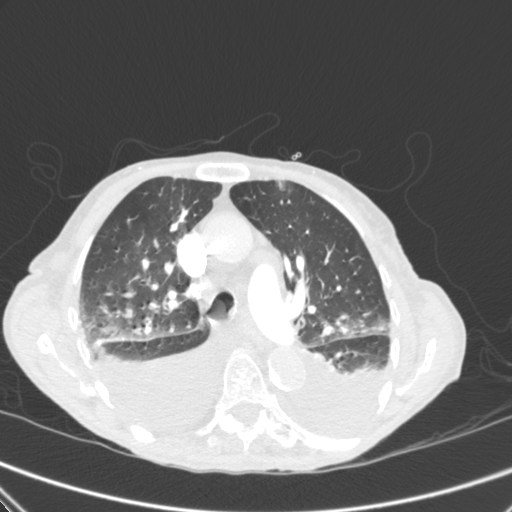
[im 185/277  mediastinal]
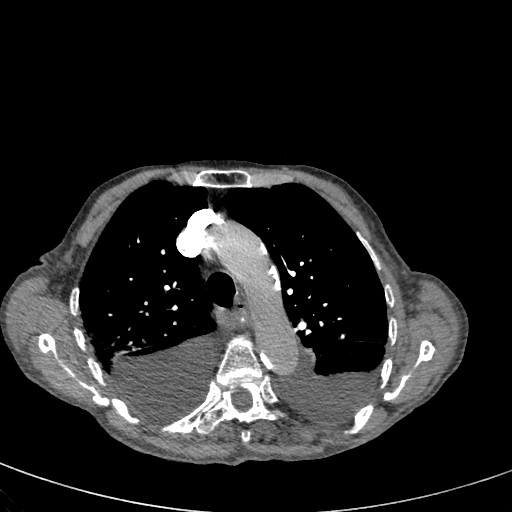
[im 200/277  lung]
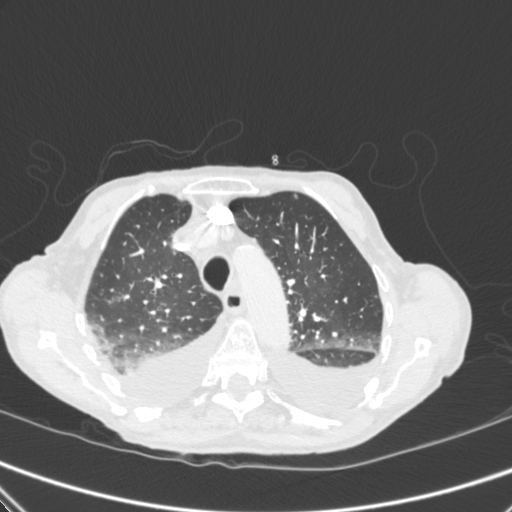
[im 215/277  mediastinal]
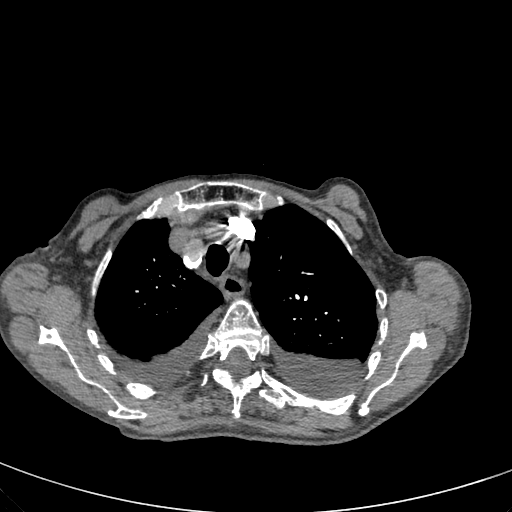
[im 231/277  lung]
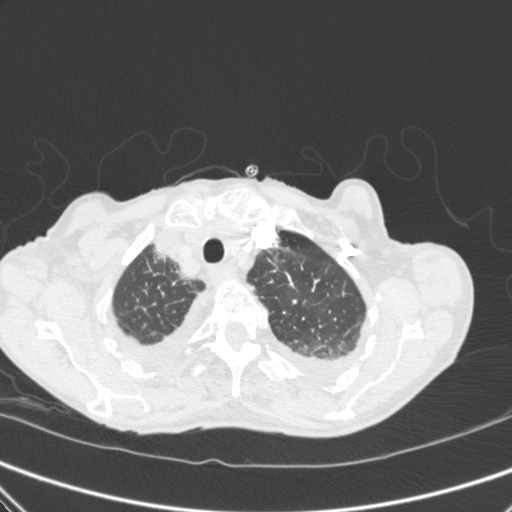
[im 246/277  mediastinal]
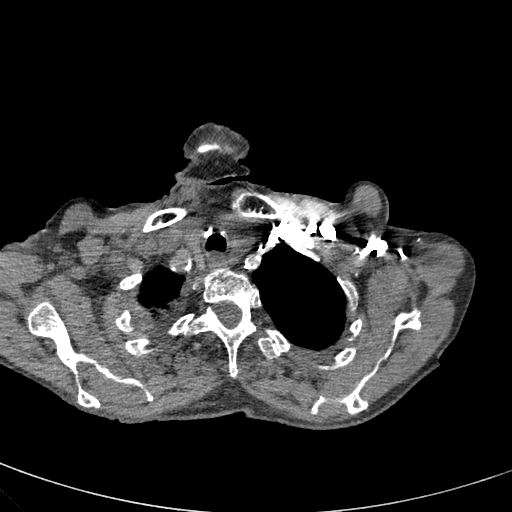
[im 261/277  lung]
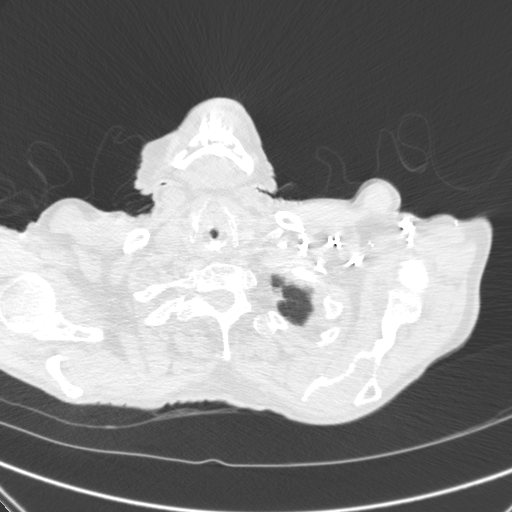

[mpr coronals · coronal · 0.68mm/px · 1 of 106 slices shown]
[im 53/106  mediastinal]
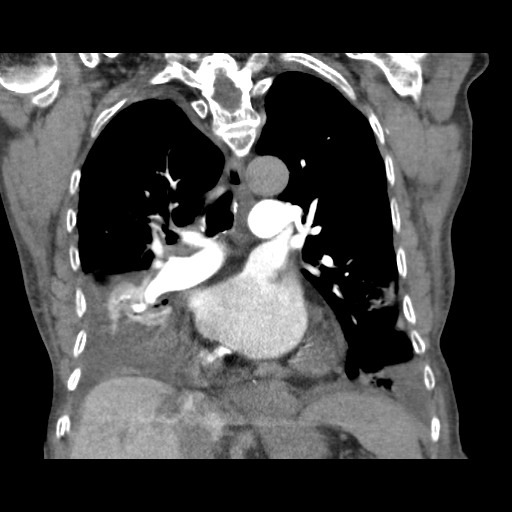

[18 of 36 positions shown; findings below may reference images not displayed]

FINDINGS: No pulmonary embolus is identified. The patient has moderate to
moderately large bilateral pleural effusions. No pericardial
effusion is seen. There is marked cardiomegaly. A large hiatal
hernia is identified. There is no axillary, hilar or mediastinal
lymphadenopathy. Lungs demonstrate scattered areas of ground-glass
attenuation. Compressive atelectasis from the patient's effusions is
also seen. There is some interlobular septal thickening in the
apices.

Incidentally imaged upper abdomen shows no focal abnormality. Bones
demonstrate post operative change at T11 and T12 of vertebral
augmentation. Compression fracture deformities of T5, T9 and L1 are
noted.

Review of the MIP images confirms the above findings.
IMPRESSION: Negative for pulmonary embolus.

Moderate to moderately large bilateral pleural effusions.

Cardiomegaly with scattered ground-glass attenuation and
interlobular septal thickening suggesting pulmonary edema.

Multiple thoracic compression fractures.

## 2014-03-03 IMAGING — DX DG CHEST 1V PORT
1 series · 1 of 1 positions shown · non-contrast
Comparison: CTA chest dated 10/10/2012

CLINICAL DATA: Pleural effusion

EXAM:
PORTABLE CHEST - 1 VIEW

[portable]
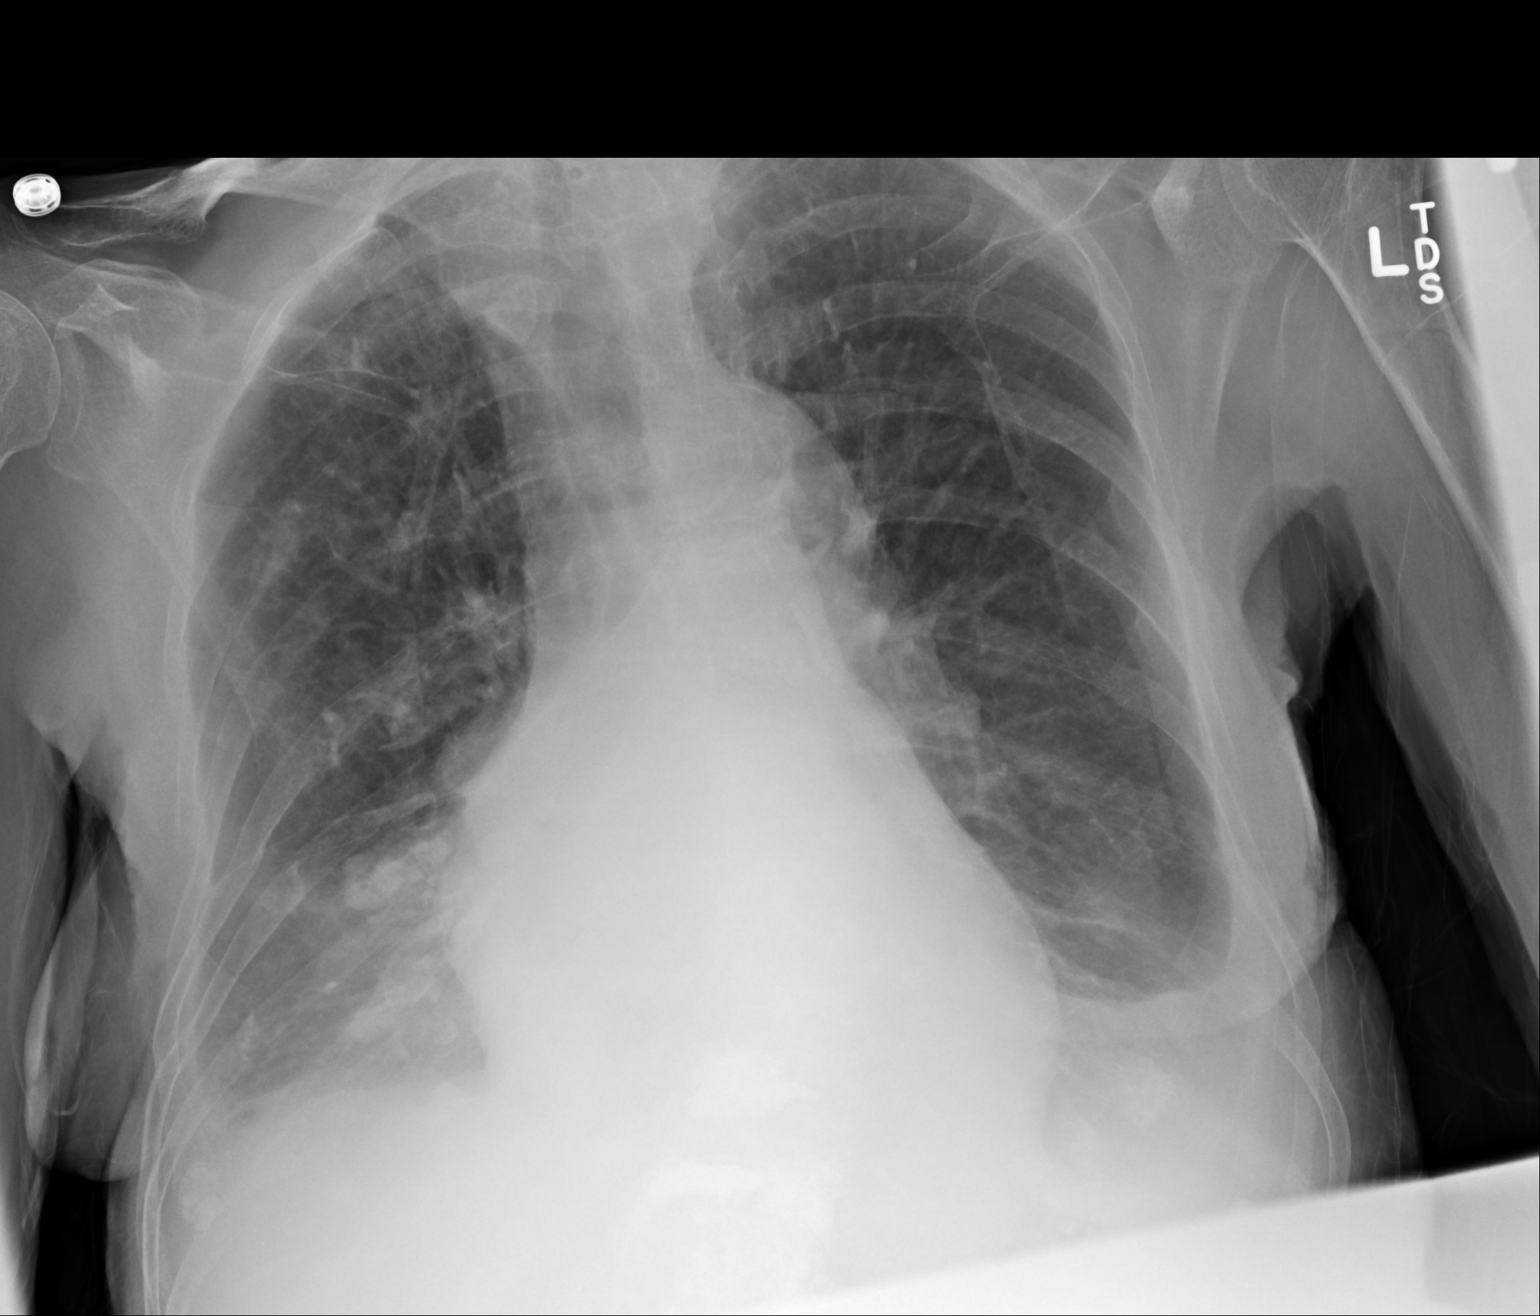

[1 of 1 positions shown; findings below may reference images not displayed]

FINDINGS: Small bilateral pleural effusions, decreased.

Underlying chronic interstitial markings/emphysematous changes.

No pneumothorax.

Heart is top-normal in size.
IMPRESSION: Small bilateral pleural effusions, decreased.

## 2014-04-07 DIAGNOSIS — I10 Essential (primary) hypertension: Secondary | ICD-10-CM | POA: Diagnosis not present

## 2014-04-07 DIAGNOSIS — R600 Localized edema: Secondary | ICD-10-CM | POA: Diagnosis not present

## 2014-04-07 DIAGNOSIS — I480 Paroxysmal atrial fibrillation: Secondary | ICD-10-CM | POA: Diagnosis not present

## 2014-04-14 DIAGNOSIS — I87319 Chronic venous hypertension (idiopathic) with ulcer of unspecified lower extremity: Secondary | ICD-10-CM | POA: Diagnosis not present

## 2014-04-14 DIAGNOSIS — R600 Localized edema: Secondary | ICD-10-CM | POA: Diagnosis not present

## 2014-04-14 DIAGNOSIS — Z6822 Body mass index (BMI) 22.0-22.9, adult: Secondary | ICD-10-CM | POA: Diagnosis not present

## 2014-04-19 ENCOUNTER — Ambulatory Visit (HOSPITAL_COMMUNITY)
Admission: RE | Admit: 2014-04-19 | Discharge: 2014-04-19 | Disposition: A | Discharge status: Home / Self Care | Payer: Medicare Other | Source: Ambulatory Visit | Attending: Surgery | Admitting: Surgery

## 2014-04-19 ENCOUNTER — Other Ambulatory Visit (HOSPITAL_COMMUNITY): Payer: Self-pay | Admitting: Internal Medicine

## 2014-04-19 DIAGNOSIS — L97921 Non-pressure chronic ulcer of unspecified part of left lower leg limited to breakdown of skin: Secondary | ICD-10-CM

## 2014-04-21 ENCOUNTER — Encounter (HOSPITAL_BASED_OUTPATIENT_CLINIC_OR_DEPARTMENT_OTHER): Payer: Medicare Other | Attending: Surgery

## 2014-04-21 DIAGNOSIS — L97321 Non-pressure chronic ulcer of left ankle limited to breakdown of skin: Secondary | ICD-10-CM | POA: Insufficient documentation

## 2014-04-21 DIAGNOSIS — I87312 Chronic venous hypertension (idiopathic) with ulcer of left lower extremity: Secondary | ICD-10-CM | POA: Diagnosis not present

## 2014-04-21 DIAGNOSIS — I87332 Chronic venous hypertension (idiopathic) with ulcer and inflammation of left lower extremity: Secondary | ICD-10-CM | POA: Insufficient documentation

## 2014-04-28 ENCOUNTER — Encounter (HOSPITAL_BASED_OUTPATIENT_CLINIC_OR_DEPARTMENT_OTHER): Payer: Medicare Other | Attending: Surgery

## 2014-04-28 DIAGNOSIS — I872 Venous insufficiency (chronic) (peripheral): Secondary | ICD-10-CM | POA: Insufficient documentation

## 2014-04-28 DIAGNOSIS — L97321 Non-pressure chronic ulcer of left ankle limited to breakdown of skin: Secondary | ICD-10-CM | POA: Diagnosis present

## 2014-04-28 DIAGNOSIS — I87312 Chronic venous hypertension (idiopathic) with ulcer of left lower extremity: Secondary | ICD-10-CM | POA: Diagnosis not present

## 2014-05-05 DIAGNOSIS — I872 Venous insufficiency (chronic) (peripheral): Secondary | ICD-10-CM | POA: Diagnosis not present

## 2014-05-05 DIAGNOSIS — L97321 Non-pressure chronic ulcer of left ankle limited to breakdown of skin: Secondary | ICD-10-CM | POA: Diagnosis not present

## 2014-05-05 DIAGNOSIS — I87312 Chronic venous hypertension (idiopathic) with ulcer of left lower extremity: Secondary | ICD-10-CM | POA: Diagnosis not present

## 2014-05-17 ENCOUNTER — Other Ambulatory Visit: Payer: Self-pay | Admitting: Oncology

## 2014-05-17 DIAGNOSIS — D0592 Unspecified type of carcinoma in situ of left breast: Secondary | ICD-10-CM

## 2014-05-19 ENCOUNTER — Ambulatory Visit (HOSPITAL_COMMUNITY)
Admission: RE | Admit: 2014-05-19 | Discharge: 2014-05-19 | Disposition: A | Discharge status: Home / Self Care | Payer: Medicare Other | Source: Ambulatory Visit | Attending: Internal Medicine | Admitting: Internal Medicine

## 2014-05-19 ENCOUNTER — Other Ambulatory Visit (HOSPITAL_COMMUNITY): Payer: Self-pay | Admitting: Internal Medicine

## 2014-05-19 DIAGNOSIS — L97929 Non-pressure chronic ulcer of unspecified part of left lower leg with unspecified severity: Secondary | ICD-10-CM | POA: Insufficient documentation

## 2014-05-19 DIAGNOSIS — E785 Hyperlipidemia, unspecified: Secondary | ICD-10-CM | POA: Insufficient documentation

## 2014-05-19 DIAGNOSIS — I10 Essential (primary) hypertension: Secondary | ICD-10-CM | POA: Insufficient documentation

## 2014-05-21 ENCOUNTER — Ambulatory Visit
Admission: RE | Admit: 2014-05-21 | Discharge: 2014-05-21 | Disposition: A | Discharge status: Home / Self Care | Payer: Medicare Other | Source: Ambulatory Visit | Attending: Oncology | Admitting: Oncology

## 2014-05-21 DIAGNOSIS — D0592 Unspecified type of carcinoma in situ of left breast: Secondary | ICD-10-CM

## 2014-05-21 DIAGNOSIS — C50912 Malignant neoplasm of unspecified site of left female breast: Secondary | ICD-10-CM | POA: Diagnosis not present

## 2014-05-28 ENCOUNTER — Other Ambulatory Visit: Payer: Self-pay | Admitting: *Deleted

## 2014-05-28 DIAGNOSIS — C50912 Malignant neoplasm of unspecified site of left female breast: Secondary | ICD-10-CM

## 2014-05-31 ENCOUNTER — Other Ambulatory Visit (HOSPITAL_BASED_OUTPATIENT_CLINIC_OR_DEPARTMENT_OTHER): Payer: Medicare Other

## 2014-05-31 ENCOUNTER — Ambulatory Visit (HOSPITAL_BASED_OUTPATIENT_CLINIC_OR_DEPARTMENT_OTHER): Payer: Medicare Other | Admitting: Oncology

## 2014-05-31 VITALS — BP 187/66 | HR 63 | Temp 97.1°F | Resp 18 | Ht 63.0 in | Wt 113.9 lb

## 2014-05-31 DIAGNOSIS — Z170 Estrogen receptor positive status [ER+]: Secondary | ICD-10-CM

## 2014-05-31 DIAGNOSIS — C50412 Malignant neoplasm of upper-outer quadrant of left female breast: Secondary | ICD-10-CM | POA: Diagnosis not present

## 2014-05-31 DIAGNOSIS — C50919 Malignant neoplasm of unspecified site of unspecified female breast: Secondary | ICD-10-CM

## 2014-05-31 DIAGNOSIS — C50912 Malignant neoplasm of unspecified site of left female breast: Secondary | ICD-10-CM

## 2014-05-31 LAB — COMPREHENSIVE METABOLIC PANEL (CC13)
ALT: 25 U/L (ref 0–55)
ANION GAP: 11 meq/L (ref 3–11)
AST: 28 U/L (ref 5–34)
Albumin: 3.9 g/dL (ref 3.5–5.0)
Alkaline Phosphatase: 63 U/L (ref 40–150)
BUN: 18 mg/dL (ref 7.0–26.0)
CALCIUM: 9.5 mg/dL (ref 8.4–10.4)
CO2: 30 meq/L — AB (ref 22–29)
CREATININE: 1 mg/dL (ref 0.6–1.1)
Chloride: 100 mEq/L (ref 98–109)
EGFR: 52 mL/min/{1.73_m2} — AB (ref >90)
Glucose: 104 mg/dl (ref 70–140)
Potassium: 3.4 mEq/L — ABNORMAL LOW (ref 3.5–5.1)
Sodium: 141 mEq/L (ref 136–145)
Total Bilirubin: 1.04 mg/dL (ref 0.20–1.20)
Total Protein: 6.8 g/dL (ref 6.4–8.3)

## 2014-05-31 LAB — CBC WITH DIFFERENTIAL/PLATELET
BASO%: 0.3 % (ref 0.0–2.0)
Basophils Absolute: 0 10*3/uL (ref 0.0–0.1)
EOS%: 4.8 % (ref 0.0–7.0)
Eosinophils Absolute: 0.3 10*3/uL (ref 0.0–0.5)
HCT: 40.7 % (ref 34.8–46.6)
HEMOGLOBIN: 13.5 g/dL (ref 11.6–15.9)
LYMPH%: 20.4 % (ref 14.0–49.7)
MCH: 34.1 pg — AB (ref 25.1–34.0)
MCHC: 33.2 g/dL (ref 31.5–36.0)
MCV: 102.8 fL — ABNORMAL HIGH (ref 79.5–101.0)
MONO#: 0.6 10*3/uL (ref 0.1–0.9)
MONO%: 9.4 % (ref 0.0–14.0)
NEUT#: 4 10*3/uL (ref 1.5–6.5)
NEUT%: 65.1 % (ref 38.4–76.8)
Platelets: 158 10*3/uL (ref 145–400)
RBC: 3.96 10*6/uL (ref 3.70–5.45)
RDW: 14.7 % — AB (ref 11.2–14.5)
WBC: 6.2 10*3/uL (ref 3.9–10.3)
lymph#: 1.3 10*3/uL (ref 0.9–3.3)

## 2014-05-31 MED ORDER — LETROZOLE 2.5 MG PO TABS: 2.5000 mg | ORAL_TABLET | Freq: Every day | ORAL | Status: DC

## 2014-05-31 NOTE — Progress Notes (Signed)
Holly Weaver  [phone] Fax:(336) [phone]     ID: Holly Weaver OB: 1923/12/30  MR#: [id number]  YSA#:[id number]  PCP: [name], MD GYN:   SU:  OTHER MD: Meridee Score  CHIEF COMPLAINT:Estrogen receptor positive breast cancer  CURRENT TREATMENT: Letrozole  BREAST CANCER HISTORY: From Dr. Collier [name]'s intake note dated 03/28/2005  [name] only had two mammograms in her lifetime.  She noted some change in her left breast, but only about a year ago.  She did undergo a mammogram on 02/16/2005, which showed a possible mass in the left breast.  Left diagnostic mammogram and ultrasound was performed on 03/07/2005, this showed a spiculated mass in the left breast.  Core biopsy was recommended.  Sonographically, this was a hypoechoic mass measuring 1.9 x 1.9 cm at the 2 o'clock position approximately 3.0 cm from the nipple.  Biopsy took place on 03/07/2005, which showed invasive mammary carcinoma intermediate grade.  This is strongly ER and PR positive 95% and 13% respectively.  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  The patient's subsequent history is as detailed below  INTERVAL HISTORY: Holly Weaver returns today for follow-up of her breast cancer accompanied by her daughter Holly Weaver. She continues on letrozole. She has no hot flashes and no other symptoms that she is aware of related to this medication. In particular she never developed the arthralgias and myalgias that many patients can experience on this medication. She obtains it at a very good price.   REVIEW OF SYSTEMS: Lillan has not been doing much walking since she had her a hip replacement about 2 years ago. She mostly "sits around and watches TV". There have not been any breast changes that she knows of, no unusual headaches, visual changes, nausea, or  vomiting. She denies cough, phlegm production, or pleurisy. There have been no change in bowel or bladder habits. A detailed review of systems today was stable.  PAST MEDICAL HISTORY: Past Medical History  Diagnosis Date  . Hypertension   . Osteoporosis   . Scoliosis   . Atrial fibrillation   . Compression fracture 09/21/2012  . Dyslipidemia 09/21/2012  . MVP (mitral valve prolapse) 09/23/2012  . Breast cancer ~ 2006    "left" (10/09/2012)  . Heart murmur   . Shortness of breath     "just now; nose is dry" (10/09/2012)  . History of blood transfusion     "years and years ago; I was anemic" (10/09/2012)  . Cervical spondylolysis 09/21/2012  . Osteoarthritis 09/21/2012  . Arthritis     "hands and legs" (10/09/2012)  . Chronic lower back pain     PAST SURGICAL HISTORY: Past Surgical History  Procedure Laterality Date  . Femur im nail Left 09/21/2012    Procedure: INTRAMEDULLARY (IM) NAIL FEMORAL;  Surgeon: Newt Minion, MD;  Location: [facility];  Service: Orthopedics;  Laterality: Left;  . Hernia repair  1950's?  . Cataract extraction w/ intraocular lens implant Left 2000's  . Breast biopsy Left ~ 2006    FAMILY HISTORY Family History  Problem Relation Age of Onset  . Cancer Mother   . CAD Father   . Cancer Other    the patient's father died at the age of 87 from postoperative complications. The patient's mother died at the age of 68 with what sounds like lung cancer  although the patient stated sinus cancer. The patient had 2 brothers, no sisters. There is no history of breast or ovarian cancer in the immediate family.  GYNECOLOGIC HISTORY:  Menarche age 24, first live birth age 29, the patient is [name]. She went to the change of life in her 48s. Gender took hormone replacement.  SOCIAL HISTORY:  Neylan used to works for [employer]. She is now retired. She is separated from her husband and he subsequently died approximately 7 years ago. The patient lives alone, with no pets, but her  daughter Holly Weaver is there just about every day. 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 The patient's son had a brain stem injury from a motorcycle accident and cannot walk. He is married and lives independently. The patient has 4 grandchildren and 5 great-grandchildren. She is a Tourist information centre manager    ADVANCED DIRECTIVES: In place; the patient's daughter Holly Weaver is healthcare power of attorney   HEALTH MAINTENANCE: History  Substance Use Topics  . Smoking status: Never Smoker   . Smokeless tobacco: Never Used  . Alcohol Use: No     Colonoscopy:  PAP:  Bone density:  Lipid panel:  Allergies  Allergen Reactions  . Codeine Nausea And Vomiting    Current Outpatient Prescriptions  Medication Sig Dispense Refill  . alendronate (FOSAMAX) 70 MG tablet Take 70 mg by mouth every 7 (seven) days. Take with a full glass of water on an empty stomach.    . ALPRAZolam (XANAX) 0.25 MG tablet Take one tablet by mouth every 12 hours as needed for severe anxiety 60 tablet 5  . amiodarone (PACERONE) 200 MG tablet Take 200 mg by mouth every morning.     Marland Kitchen aspirin EC 81 MG tablet Take 81 mg by mouth every morning.     Marland Kitchen atorvastatin (LIPITOR) 40 MG tablet Take 40 mg by mouth every evening.     . calcium-vitamin D (OSCAL WITH D) 500-200 MG-UNIT per tablet Take 1 tablet by mouth every morning.     Marland Kitchen ELIQUIS 2.5 MG TABS tablet     . furosemide (LASIX) 40 MG tablet Take 1 tablet (40 mg total) by mouth daily. 30 tablet 0  . HYDROcodone-acetaminophen (NORCO) 7.5-325 MG per tablet Take 1 tablet by mouth every 6 (six) hours as needed for pain.    Marland Kitchen letrozole (FEMARA) 2.5 MG tablet Take 1 tablet (2.5 mg total) by mouth daily. 90 tablet 4  . metoprolol (LOPRESSOR) 100 MG tablet Take 100 mg by mouth 2 (two) times daily.    . Multiple Vitamin (MULTIVITAMIN WITH MINERALS) TABS Take 1 tablet by mouth every morning. Centrum 50+    . ondansetron (ZOFRAN) 4 MG tablet Take 1 tablet (4  mg total) by mouth every 6 (six) hours as needed for nausea. 20 tablet 0  . polyethylene glycol (MIRALAX / GLYCOLAX) packet Take 17 g by mouth every morning.     . potassium chloride SA (K-DUR,KLOR-CON) 20 MEQ tablet     . traMADol (ULTRAM) 50 MG tablet Take 1 tablet (50 mg total) by mouth every 8 (eight) hours as needed for pain. 50 tablet 0  . Vitamin D, Ergocalciferol, (DRISDOL) 50000 UNITS CAPS Take 50,000 Units by mouth every 7 (seven) days.     No current facility-administered medications for this visit.    OBJECTIVE: Elderly white woman in no acute distress  Filed Vitals:   05/31/14 1514  BP: 187/66  Pulse:   Temp:  Resp:      Body mass index is 20.18 kg/(m^2).    ECOG FS:2 - Symptomatic, <50% confined to bed  Sclerae unicteric, pupils round and equal Oropharynx clear and moist-- no thrush or other lesions No cervical or supraclavicular adenopathy Lungs no rales or rhonchi Heart regular rate and rhythm Abd soft, nontender, positive bowel sounds MSK kyphosis and scoliosis but no focal spinal tenderness, no upper extremity lymphedema Neuro: nonfocal, well oriented, positive affect Breasts: The right breast is unremarkable. The left breast is status post prior biopsy. There is no palpable mass. The left axilla is benign.   LAB RESULTS:  CMP     Component Value Date/Time   NA 141 05/31/2014 1447   NA 136 10/14/2012 0440   K 3.4* 05/31/2014 1447   K 3.5 10/14/2012 0440   CL 93* 10/14/2012 0440   CO2 30* 05/31/2014 1447   CO2 36* 10/14/2012 0440   GLUCOSE 104 05/31/2014 1447   GLUCOSE 89 10/14/2012 0440   BUN 18.0 05/31/2014 1447   BUN 21 10/14/2012 0440   CREATININE 1.0 05/31/2014 1447   CREATININE 0.86 10/14/2012 0440   CALCIUM 9.5 05/31/2014 1447   CALCIUM 8.7 10/14/2012 0440   PROT 6.8 05/31/2014 1447   PROT 5.3* 10/09/2012 0425   ALBUMIN 3.9 05/31/2014 1447   ALBUMIN 2.8* 10/09/2012 0425   AST 28 05/31/2014 1447   AST 49* 10/09/2012 0425   ALT 25  05/31/2014 1447   ALT 44* 10/09/2012 0425   ALKPHOS 63 05/31/2014 1447   ALKPHOS 145* 10/09/2012 0425   BILITOT 1.04 05/31/2014 1447   BILITOT 0.9 10/09/2012 0425   GFRNONAA 59* 10/14/2012 0440   GFRAA 68* 10/14/2012 0440    I No results found for: SPEP  Lab Results  Component Value Date   WBC 6.2 05/31/2014   NEUTROABS 4.0 05/31/2014   HGB 13.5 05/31/2014   HCT 40.7 05/31/2014   MCV 102.8* 05/31/2014   PLT 158 05/31/2014      Chemistry      Component Value Date/Time   NA 141 05/31/2014 1447   NA 136 10/14/2012 0440   K 3.4* 05/31/2014 1447   K 3.5 10/14/2012 0440   CL 93* 10/14/2012 0440   CO2 30* 05/31/2014 1447   CO2 36* 10/14/2012 0440   BUN 18.0 05/31/2014 1447   BUN 21 10/14/2012 0440   CREATININE 1.0 05/31/2014 1447   CREATININE 0.86 10/14/2012 0440      Component Value Date/Time   CALCIUM 9.5 05/31/2014 1447   CALCIUM 8.7 10/14/2012 0440   ALKPHOS 63 05/31/2014 1447   ALKPHOS 145* 10/09/2012 0425   AST 28 05/31/2014 1447   AST 49* 10/09/2012 0425   ALT 25 05/31/2014 1447   ALT 44* 10/09/2012 0425   BILITOT 1.04 05/31/2014 1447   BILITOT 0.9 10/09/2012 0425       Lab Results  Component Value Date   LABCA2 16 04/14/2010    No components found for: [id number]  No results for input(s): INR in the last 168 hours.  Urinalysis    Component Value Date/Time   COLORURINE AMBER* 10/08/2012 1820   APPEARANCEUR HAZY* 10/08/2012 1820   LABSPEC 1.026 10/08/2012 1820   PHURINE 6.0 10/08/2012 1820   GLUCOSEU NEGATIVE 10/08/2012 1820   HGBUR NEGATIVE 10/08/2012 1820   BILIRUBINUR SMALL* 10/08/2012 1820   KETONESUR 15* 10/08/2012 1820   PROTEINUR NEGATIVE 10/08/2012 1820   UROBILINOGEN 1.0 10/08/2012 1820   NITRITE NEGATIVE 10/08/2012 1820   LEUKOCYTESUR MODERATE* 10/08/2012  1820    STUDIES: Mm Diag Breast Tomo Bilateral  05/21/2014   CLINICAL DATA:  Patient with a biopsy proven left breast carcinoma. Choose treated with hormone therapy only due to  her overall biopsy was medical condition. Biopsy was performed in February 2007.  EXAM: DIGITAL DIAGNOSTIC BILATERAL MAMMOGRAM WITH 3D TOMOSYNTHESIS AND CAD  COMPARISON:  Prior exams  ACR Breast Density Category b: There are scattered areas of fibroglandular density.  FINDINGS: Architectural distortion in the upper outer left breast is unchanged. There are no discrete masses or other areas of distortion. There are no suspicious calcifications. No mammographic change.  Mammographic images were processed with CAD.  IMPRESSION: Known left breast carcinoma, controlled with hormone therapy. No evidence of compression of the carcinoma or of new malignancy.  RECOMMENDATION: Diagnostic mammography in 1 year.  I have discussed the findings and recommendations with the patient. Results were also provided in writing at the conclusion of the visit. If applicable, a reminder letter will be sent to the patient regarding the next appointment.  BI-RADS CATEGORY  6: Known biopsy-proven malignancy.   Electronically Signed   By: Lajean Manes M.D.   On: 05/21/2014 14:41     ASSESSMENT: 81 y.o. s/p left breast upper outer quadrant biopsy 03/07/2005 for a clinical T2 N0, stage IIA invasive ductal carcinoma, grade 2, estrogen receptor 95% positive, progesterone receptor 13% positive, HER-2 nonamplified  (1) the patient opted to forgo surgery and radiation   (2) on letrozole since March of 2007.  PLAN:  Danella is tolerating the letrozole well. She obtains it under very good price. She now has no measurable disease. This is very favorable.  She will return to see [place] in one year. She understands the plan is to continue letrozole indefinitely. She'll have a mammogram before her next visit here.   [name], MD   05/31/2014 3:25 PM

## 2014-06-01 ENCOUNTER — Telehealth: Payer: Self-pay | Admitting: Oncology

## 2014-06-01 NOTE — Telephone Encounter (Signed)
Confirmed one year appointment. Mailed calendar for May 2017

## 2014-06-25 ENCOUNTER — Ambulatory Visit (HOSPITAL_COMMUNITY)
Admission: RE | Admit: 2014-06-25 | Discharge: 2014-06-25 | Disposition: A | Discharge status: Home / Self Care | Payer: Medicare Other | Source: Ambulatory Visit | Attending: Internal Medicine | Admitting: Internal Medicine

## 2014-06-25 ENCOUNTER — Encounter (HOSPITAL_COMMUNITY): Payer: Self-pay

## 2014-06-25 ENCOUNTER — Other Ambulatory Visit (HOSPITAL_COMMUNITY): Payer: Self-pay | Admitting: Internal Medicine

## 2014-06-25 DIAGNOSIS — M810 Age-related osteoporosis without current pathological fracture: Secondary | ICD-10-CM | POA: Insufficient documentation

## 2014-06-25 MED ORDER — DENOSUMAB 60 MG/ML ~~LOC~~ SOLN
60.0000 mg | Freq: Once | SUBCUTANEOUS | Status: AC
  Administered 2014-06-25: 60 mg via SUBCUTANEOUS
  Filled 2014-06-25: qty 1

## 2014-06-25 NOTE — Discharge Instructions (Signed)
Prolia Denosumab injection What is this medicine? DENOSUMAB (den oh sue mab) slows bone breakdown. Prolia is used to treat osteoporosis in women after menopause and in men. Delton See is used to prevent bone fractures and other bone problems caused by cancer bone metastases. Delton See is also used to treat giant cell tumor of the bone. This medicine may be used for other purposes; ask your health care provider or pharmacist if you have questions. COMMON BRAND NAME(S): Prolia, XGEVA What should I tell my health care provider before I take this medicine? They need to know if you have any of these conditions: -dental disease -eczema -infection or history of infections -kidney disease or on dialysis -low blood calcium or vitamin D -malabsorption syndrome -scheduled to have surgery or tooth extraction -taking medicine that contains denosumab -thyroid or parathyroid disease -an unusual reaction to denosumab, other medicines, foods, dyes, or preservatives -pregnant or trying to get pregnant -breast-feeding How should I use this medicine? This medicine is for injection under the skin. It is given by a health care professional in a hospital or clinic setting. If you are getting Prolia, a special MedGuide will be given to you by the pharmacist with each prescription and refill. Be sure to read this information carefully each time. For Prolia, talk to your pediatrician regarding the use of this medicine in children. Special care may be needed. For Delton See, talk to your pediatrician regarding the use of this medicine in children. While this drug may be prescribed for children as young as 13 years for selected conditions, precautions do apply. Overdosage: If you think you've taken too much of this medicine contact a poison control center or emergency room at once. Overdosage: If you think you have taken too much of this medicine contact a poison control center or emergency room at once. NOTE: This medicine is only  for you. Do not share this medicine with others. What if I miss a dose? It is important not to miss your dose. Call your doctor or health care professional if you are unable to keep an appointment. What may interact with this medicine? Do not take this medicine with any of the following medications: -other medicines containing denosumab This medicine may also interact with the following medications: -medicines that suppress the immune system -medicines that treat cancer -steroid medicines like prednisone or cortisone This list may not describe all possible interactions. Give your health care provider a list of all the medicines, herbs, non-prescription drugs, or dietary supplements you use. Also tell them if you smoke, drink alcohol, or use illegal drugs. Some items may interact with your medicine. What should I watch for while using this medicine? Visit your doctor or health care professional for regular checks on your progress. Your doctor or health care professional may order blood tests and other tests to see how you are doing. Call your doctor or health care professional if you get a cold or other infection while receiving this medicine. Do not treat yourself. This medicine may decrease your body's ability to fight infection. You should make sure you get enough calcium and vitamin D while you are taking this medicine, unless your doctor tells you not to. Discuss the foods you eat and the vitamins you take with your health care professional. See your dentist regularly. Brush and floss your teeth as directed. Before you have any dental work done, tell your dentist you are receiving this medicine. Do not become pregnant while taking this medicine or for 5 months after  stopping it. Women should inform their doctor if they wish to become pregnant or think they might be pregnant. There is a potential for serious side effects to an unborn child. Talk to your health care professional or pharmacist for  more information. What side effects may I notice from receiving this medicine? Side effects that you should report to your doctor or health care professional as soon as possible: -allergic reactions like skin rash, itching or hives, swelling of the face, lips, or tongue -breathing problems -chest pain -fast, irregular heartbeat -feeling faint or lightheaded, falls -fever, chills, or any other sign of infection -muscle spasms, tightening, or twitches -numbness or tingling -skin blisters or bumps, or is dry, peels, or red -slow healing or unexplained pain in the mouth or jaw -unusual bleeding or bruising Side effects that usually do not require medical attention (Report these to your doctor or health care professional if they continue or are bothersome.): -muscle pain -stomach upset, gas This list may not describe all possible side effects. Call your doctor for medical advice about side effects. You may report side effects to FDA at 1-800-FDA-1088. Where should I keep my medicine? This medicine is only given in a clinic, doctor's office, or other health care setting and will not be stored at home. NOTE: This sheet is a summary. It may not cover all possible information. If you have questions about this medicine, talk to your doctor, pharmacist, or health care provider.  2015, Elsevier/Gold Standard. (2011-07-09 12:37:47)

## 2014-07-07 ENCOUNTER — Other Ambulatory Visit: Payer: Self-pay | Admitting: Oncology

## 2014-08-18 ENCOUNTER — Telehealth: Payer: Self-pay | Admitting: Oncology

## 2014-08-18 NOTE — Telephone Encounter (Signed)
Confirmed appointment change for May 2017 to earlier

## 2014-08-30 DIAGNOSIS — E785 Hyperlipidemia, unspecified: Secondary | ICD-10-CM | POA: Diagnosis not present

## 2014-08-30 DIAGNOSIS — Z1389 Encounter for screening for other disorder: Secondary | ICD-10-CM | POA: Diagnosis not present

## 2014-08-30 DIAGNOSIS — R7301 Impaired fasting glucose: Secondary | ICD-10-CM | POA: Diagnosis not present

## 2014-08-30 DIAGNOSIS — I480 Paroxysmal atrial fibrillation: Secondary | ICD-10-CM | POA: Diagnosis not present

## 2014-08-30 DIAGNOSIS — M810 Age-related osteoporosis without current pathological fracture: Secondary | ICD-10-CM | POA: Diagnosis not present

## 2014-08-30 DIAGNOSIS — Z6820 Body mass index (BMI) 20.0-20.9, adult: Secondary | ICD-10-CM | POA: Diagnosis not present

## 2014-08-30 DIAGNOSIS — R600 Localized edema: Secondary | ICD-10-CM | POA: Diagnosis not present

## 2014-08-30 DIAGNOSIS — I10 Essential (primary) hypertension: Secondary | ICD-10-CM | POA: Diagnosis not present

## 2014-09-11 ENCOUNTER — Emergency Department (HOSPITAL_COMMUNITY): Payer: Medicare Other

## 2014-09-11 ENCOUNTER — Inpatient Hospital Stay (HOSPITAL_COMMUNITY): Payer: Medicare Other

## 2014-09-11 ENCOUNTER — Encounter (HOSPITAL_COMMUNITY): Payer: Self-pay | Admitting: Emergency Medicine

## 2014-09-11 ENCOUNTER — Inpatient Hospital Stay (HOSPITAL_COMMUNITY)
Admission: RE | Admit: 2014-09-11 | Discharge: 2014-09-15 | DRG: 193 | Disposition: A | Discharge status: Home Health | Payer: Medicare Other | Attending: Internal Medicine | Admitting: Internal Medicine

## 2014-09-11 DIAGNOSIS — M4302 Spondylolysis, cervical region: Secondary | ICD-10-CM | POA: Diagnosis present

## 2014-09-11 DIAGNOSIS — J479 Bronchiectasis, uncomplicated: Secondary | ICD-10-CM | POA: Diagnosis present

## 2014-09-11 DIAGNOSIS — Z681 Body mass index (BMI) 19 or less, adult: Secondary | ICD-10-CM

## 2014-09-11 DIAGNOSIS — R1012 Left upper quadrant pain: Secondary | ICD-10-CM | POA: Diagnosis not present

## 2014-09-11 DIAGNOSIS — K219 Gastro-esophageal reflux disease without esophagitis: Secondary | ICD-10-CM | POA: Diagnosis present

## 2014-09-11 DIAGNOSIS — J189 Pneumonia, unspecified organism: Secondary | ICD-10-CM

## 2014-09-11 DIAGNOSIS — M1389 Other specified arthritis, multiple sites: Secondary | ICD-10-CM | POA: Diagnosis present

## 2014-09-11 DIAGNOSIS — J9601 Acute respiratory failure with hypoxia: Secondary | ICD-10-CM | POA: Diagnosis not present

## 2014-09-11 DIAGNOSIS — L89151 Pressure ulcer of sacral region, stage 1: Secondary | ICD-10-CM | POA: Diagnosis present

## 2014-09-11 DIAGNOSIS — I2699 Other pulmonary embolism without acute cor pulmonale: Secondary | ICD-10-CM

## 2014-09-11 DIAGNOSIS — E785 Hyperlipidemia, unspecified: Secondary | ICD-10-CM | POA: Diagnosis present

## 2014-09-11 DIAGNOSIS — M545 Low back pain: Secondary | ICD-10-CM | POA: Diagnosis present

## 2014-09-11 DIAGNOSIS — M419 Scoliosis, unspecified: Secondary | ICD-10-CM | POA: Diagnosis present

## 2014-09-11 DIAGNOSIS — Z809 Family history of malignant neoplasm, unspecified: Secondary | ICD-10-CM

## 2014-09-11 DIAGNOSIS — J181 Lobar pneumonia, unspecified organism: Principal | ICD-10-CM | POA: Diagnosis present

## 2014-09-11 DIAGNOSIS — Z79899 Other long term (current) drug therapy: Secondary | ICD-10-CM | POA: Diagnosis not present

## 2014-09-11 DIAGNOSIS — D649 Anemia, unspecified: Secondary | ICD-10-CM | POA: Diagnosis present

## 2014-09-11 DIAGNOSIS — I480 Paroxysmal atrial fibrillation: Secondary | ICD-10-CM | POA: Diagnosis present

## 2014-09-11 DIAGNOSIS — L8990 Pressure ulcer of unspecified site, unspecified stage: Secondary | ICD-10-CM | POA: Diagnosis present

## 2014-09-11 DIAGNOSIS — R1084 Generalized abdominal pain: Secondary | ICD-10-CM | POA: Diagnosis not present

## 2014-09-11 DIAGNOSIS — I341 Nonrheumatic mitral (valve) prolapse: Secondary | ICD-10-CM | POA: Diagnosis present

## 2014-09-11 DIAGNOSIS — E43 Unspecified severe protein-calorie malnutrition: Secondary | ICD-10-CM | POA: Diagnosis not present

## 2014-09-11 DIAGNOSIS — Z8249 Family history of ischemic heart disease and other diseases of the circulatory system: Secondary | ICD-10-CM

## 2014-09-11 DIAGNOSIS — I5032 Chronic diastolic (congestive) heart failure: Secondary | ICD-10-CM | POA: Diagnosis present

## 2014-09-11 DIAGNOSIS — R110 Nausea: Secondary | ICD-10-CM | POA: Diagnosis present

## 2014-09-11 DIAGNOSIS — R918 Other nonspecific abnormal finding of lung field: Secondary | ICD-10-CM | POA: Diagnosis not present

## 2014-09-11 DIAGNOSIS — G8929 Other chronic pain: Secondary | ICD-10-CM | POA: Diagnosis present

## 2014-09-11 DIAGNOSIS — R109 Unspecified abdominal pain: Secondary | ICD-10-CM | POA: Diagnosis not present

## 2014-09-11 DIAGNOSIS — I8290 Acute embolism and thrombosis of unspecified vein: Secondary | ICD-10-CM

## 2014-09-11 DIAGNOSIS — I10 Essential (primary) hypertension: Secondary | ICD-10-CM | POA: Diagnosis present

## 2014-09-11 DIAGNOSIS — R627 Adult failure to thrive: Secondary | ICD-10-CM | POA: Diagnosis present

## 2014-09-11 DIAGNOSIS — M8088XG Other osteoporosis with current pathological fracture, vertebra(e), subsequent encounter for fracture with delayed healing: Secondary | ICD-10-CM | POA: Diagnosis present

## 2014-09-11 DIAGNOSIS — C50412 Malignant neoplasm of upper-outer quadrant of left female breast: Secondary | ICD-10-CM | POA: Diagnosis present

## 2014-09-11 DIAGNOSIS — C50919 Malignant neoplasm of unspecified site of unspecified female breast: Secondary | ICD-10-CM | POA: Diagnosis not present

## 2014-09-11 DIAGNOSIS — I2510 Atherosclerotic heart disease of native coronary artery without angina pectoris: Secondary | ICD-10-CM | POA: Diagnosis not present

## 2014-09-11 DIAGNOSIS — Z7901 Long term (current) use of anticoagulants: Secondary | ICD-10-CM | POA: Diagnosis not present

## 2014-09-11 DIAGNOSIS — C50912 Malignant neoplasm of unspecified site of left female breast: Secondary | ICD-10-CM | POA: Diagnosis not present

## 2014-09-11 DIAGNOSIS — Z885 Allergy status to narcotic agent status: Secondary | ICD-10-CM | POA: Diagnosis not present

## 2014-09-11 LAB — URINALYSIS, ROUTINE W REFLEX MICROSCOPIC
Glucose, UA: NEGATIVE mg/dL
Hgb urine dipstick: NEGATIVE
KETONES UR: NEGATIVE mg/dL
NITRITE: NEGATIVE
PH: 5.5 (ref 5.0–8.0)
PROTEIN: NEGATIVE mg/dL
Specific Gravity, Urine: 1.021 (ref 1.005–1.030)
Urobilinogen, UA: 1 mg/dL (ref 0.0–1.0)

## 2014-09-11 LAB — COMPREHENSIVE METABOLIC PANEL
ALK PHOS: 59 U/L (ref 38–126)
ALT: 20 U/L (ref 14–54)
AST: 31 U/L (ref 15–41)
Albumin: 4.2 g/dL (ref 3.5–5.0)
Anion gap: 10 (ref 5–15)
BILIRUBIN TOTAL: 1 mg/dL (ref 0.3–1.2)
BUN: 22 mg/dL — AB (ref 6–20)
CALCIUM: 9.2 mg/dL (ref 8.9–10.3)
CO2: 28 mmol/L (ref 22–32)
CREATININE: 1.19 mg/dL — AB (ref 0.44–1.00)
Chloride: 99 mmol/L — ABNORMAL LOW (ref 101–111)
GFR, EST AFRICAN AMERICAN: 46 mL/min — AB (ref >60)
GFR, EST NON AFRICAN AMERICAN: 39 mL/min — AB (ref >60)
Glucose, Bld: 124 mg/dL — ABNORMAL HIGH (ref 65–99)
Potassium: 4.1 mmol/L (ref 3.5–5.1)
Sodium: 137 mmol/L (ref 135–145)
TOTAL PROTEIN: 7.5 g/dL (ref 6.5–8.1)

## 2014-09-11 LAB — I-STAT TROPONIN, ED: Troponin i, poc: 0.01 ng/mL (ref 0.00–0.08)

## 2014-09-11 LAB — CBC
HCT: 41 % (ref 36.0–46.0)
Hemoglobin: 13.4 g/dL (ref 12.0–15.0)
MCH: 33.5 pg (ref 26.0–34.0)
MCHC: 32.7 g/dL (ref 30.0–36.0)
MCV: 102.5 fL — ABNORMAL HIGH (ref 78.0–100.0)
PLATELETS: 208 10*3/uL (ref 150–400)
RBC: 4 MIL/uL (ref 3.87–5.11)
RDW: 14.5 % (ref 11.5–15.5)
WBC: 6.1 10*3/uL (ref 4.0–10.5)

## 2014-09-11 LAB — TROPONIN I

## 2014-09-11 LAB — LIPASE, BLOOD: LIPASE: 29 U/L (ref 22–51)

## 2014-09-11 LAB — HEPARIN LEVEL (UNFRACTIONATED)
Heparin Unfractionated: >2.2 IU/mL — ABNORMAL HIGH (ref 0.30–0.70)
Heparin Unfractionated: >2.2 IU/mL — ABNORMAL HIGH (ref 0.30–0.70)

## 2014-09-11 LAB — APTT
APTT: 67 s — AB (ref 24–37)
aPTT: 65 seconds — ABNORMAL HIGH (ref 24–37)

## 2014-09-11 LAB — URINE MICROSCOPIC-ADD ON

## 2014-09-11 LAB — STREP PNEUMONIAE URINARY ANTIGEN: Strep Pneumo Urinary Antigen: NEGATIVE

## 2014-09-11 LAB — MRSA PCR SCREENING: MRSA BY PCR: NEGATIVE

## 2014-09-11 MED ORDER — METOPROLOL TARTRATE 50 MG PO TABS
100.0000 mg | ORAL_TABLET | Freq: Two times a day (BID) | ORAL | Status: DC
  Filled 2014-09-11: qty 2

## 2014-09-11 MED ORDER — DEXTROSE 5 % IV SOLN
1.0000 g | Freq: Once | INTRAVENOUS | Status: AC
  Administered 2014-09-11: 1 g via INTRAVENOUS
  Filled 2014-09-11: qty 10

## 2014-09-11 MED ORDER — DEXTROSE 5 % IV SOLN
500.0000 mg | INTRAVENOUS | Status: DC
  Administered 2014-09-11 – 2014-09-13 (×3): 500 mg via INTRAVENOUS
  Filled 2014-09-11 (×3): qty 500

## 2014-09-11 MED ORDER — ONDANSETRON HCL 4 MG/2ML IJ SOLN
4.0000 mg | Freq: Four times a day (QID) | INTRAMUSCULAR | Status: DC | PRN
  Administered 2014-09-11: 4 mg via INTRAVENOUS
  Filled 2014-09-11: qty 2

## 2014-09-11 MED ORDER — ATORVASTATIN CALCIUM 40 MG PO TABS
40.0000 mg | ORAL_TABLET | Freq: Every evening | ORAL | Status: DC
  Administered 2014-09-11 – 2014-09-14 (×4): 40 mg via ORAL
  Filled 2014-09-11 (×4): qty 1

## 2014-09-11 MED ORDER — HEPARIN (PORCINE) IN NACL 100-0.45 UNIT/ML-% IJ SOLN
12.0000 [IU]/kg/h | Freq: Once | INTRAMUSCULAR | Status: AC
  Administered 2014-09-11: 12 [IU]/kg/h via INTRAVENOUS
  Filled 2014-09-11: qty 250

## 2014-09-11 MED ORDER — HYDROCODONE-ACETAMINOPHEN 7.5-325 MG PO TABS: 1.0000 | ORAL_TABLET | Freq: Four times a day (QID) | ORAL | Status: DC | PRN

## 2014-09-11 MED ORDER — PIPERACILLIN-TAZOBACTAM 3.375 G IVPB 30 MIN
3.3750 g | Freq: Once | INTRAVENOUS | Status: AC
  Administered 2014-09-11: 3.375 g via INTRAVENOUS
  Filled 2014-09-11: qty 50

## 2014-09-11 MED ORDER — AMIODARONE HCL 200 MG PO TABS
200.0000 mg | ORAL_TABLET | Freq: Every morning | ORAL | Status: DC
  Administered 2014-09-12 – 2014-09-15 (×4): 200 mg via ORAL
  Filled 2014-09-11 (×4): qty 1

## 2014-09-11 MED ORDER — ALPRAZOLAM 0.25 MG PO TABS
0.2500 mg | ORAL_TABLET | Freq: Two times a day (BID) | ORAL | Status: DC | PRN
  Administered 2014-09-15: 0.25 mg via ORAL
  Filled 2014-09-11: qty 1

## 2014-09-11 MED ORDER — FUROSEMIDE 40 MG PO TABS
40.0000 mg | ORAL_TABLET | Freq: Every day | ORAL | Status: DC
  Administered 2014-09-11 – 2014-09-15 (×5): 40 mg via ORAL
  Filled 2014-09-11 (×5): qty 1

## 2014-09-11 MED ORDER — DEXTROSE 5 % IV SOLN
1.0000 g | INTRAVENOUS | Status: DC
  Administered 2014-09-12 – 2014-09-14 (×4): 1 g via INTRAVENOUS
  Filled 2014-09-11 (×5): qty 10

## 2014-09-11 MED ORDER — POLYETHYLENE GLYCOL 3350 17 G PO PACK
17.0000 g | PACK | Freq: Every morning | ORAL | Status: DC
  Administered 2014-09-11 – 2014-09-12 (×2): 17 g via ORAL
  Filled 2014-09-11 (×4): qty 1

## 2014-09-11 MED ORDER — PROMETHAZINE HCL 25 MG/ML IJ SOLN
12.5000 mg | Freq: Four times a day (QID) | INTRAMUSCULAR | Status: DC | PRN
  Administered 2014-09-11 – 2014-09-14 (×6): 12.5 mg via INTRAVENOUS
  Filled 2014-09-11 (×6): qty 1

## 2014-09-11 MED ORDER — MORPHINE SULFATE (PF) 2 MG/ML IV SOLN
2.0000 mg | Freq: Once | INTRAVENOUS | Status: AC
  Administered 2014-09-11: 2 mg via INTRAVENOUS
  Filled 2014-09-11: qty 1

## 2014-09-11 MED ORDER — VANCOMYCIN HCL IN DEXTROSE 1-5 GM/200ML-% IV SOLN
1000.0000 mg | Freq: Once | INTRAVENOUS | Status: AC
  Administered 2014-09-11: 1000 mg via INTRAVENOUS
  Filled 2014-09-11: qty 200

## 2014-09-11 MED ORDER — IOHEXOL 350 MG/ML SOLN
100.0000 mL | Freq: Once | INTRAVENOUS | Status: AC | PRN
  Administered 2014-09-11: 100 mL via INTRAVENOUS

## 2014-09-11 MED ORDER — ONDANSETRON HCL 4 MG/2ML IJ SOLN
4.0000 mg | Freq: Once | INTRAMUSCULAR | Status: AC
  Administered 2014-09-11: 4 mg via INTRAVENOUS
  Filled 2014-09-11: qty 2

## 2014-09-11 MED ORDER — LETROZOLE 2.5 MG PO TABS
2.5000 mg | ORAL_TABLET | Freq: Every day | ORAL | Status: DC
  Administered 2014-09-11 – 2014-09-15 (×5): 2.5 mg via ORAL
  Filled 2014-09-11 (×6): qty 1

## 2014-09-11 MED ORDER — HEPARIN (PORCINE) IN NACL 100-0.45 UNIT/ML-% IJ SOLN
600.0000 [IU]/h | INTRAMUSCULAR | Status: DC
Start: 2014-09-11 — End: 2014-09-12
  Administered 2014-09-11: 600 [IU]/h via INTRAVENOUS

## 2014-09-11 NOTE — Progress Notes (Signed)
ANTICOAGULATION CONSULT NOTE - Initial Consult  Pharmacy Consult for Heparin Indication: pulmonary embolus  Allergies  Allergen Reactions  . Codeine Nausea And Vomiting    Patient Measurements: Height: 5\' 4"  (162.6 cm) Weight: 104 lb 0.9 oz (47.2 kg) IBW/kg (Calculated) : 54.7 Heparin Dosing Weight: 47 kg  Vital Signs: Temp: 98 F (36.7 C) (08/20 0644) Temp Source: Oral (08/20 0644) BP: 138/79 mmHg (08/20 0644) Pulse Rate: 56 (08/20 0644)  Labs:  Recent Labs  09/11/14 0115  HGB 13.4  HCT 41.0  PLT 208  CREATININE 1.19*    Estimated Creatinine Clearance: 23.9 mL/min (by C-G formula based on Cr of 1.19).   Medical History: Past Medical History  Diagnosis Date  . Hypertension   . Osteoporosis   . Scoliosis   . Atrial fibrillation   . Compression fracture 09/21/2012  . Dyslipidemia 09/21/2012  . MVP (mitral valve prolapse) 09/23/2012  . Breast cancer ~ 2006    "left" (10/09/2012)  . Heart murmur   . Shortness of breath     "just now; nose is dry" (10/09/2012)  . History of blood transfusion     "years and years ago; I was anemic" (10/09/2012)  . Cervical spondylolysis 09/21/2012  . Osteoarthritis 09/21/2012  . Arthritis     "hands and legs" (10/09/2012)  . Chronic lower back pain     Medications:  Prescriptions prior to admission  Medication Sig Dispense Refill Last Dose  . ALPRAZolam (XANAX) 0.25 MG tablet Take one tablet by mouth every 12 hours as needed for severe anxiety 60 tablet 5 Past Week at Unknown time  . amiodarone (PACERONE) 200 MG tablet Take 200 mg by mouth every morning.    09/10/2014 at Unknown time  . atorvastatin (LIPITOR) 40 MG tablet Take 40 mg by mouth every evening.    09/10/2014 at Unknown time  . calcium-vitamin D (OSCAL WITH D) 500-200 MG-UNIT per tablet Take 1 tablet by mouth every morning.    09/10/2014 at Unknown time  . denosumab (PROLIA) 60 MG/ML SOLN injection Inject 60 mg into the skin every 6 (six) months. Administer in upper arm,  thigh, or abdomen   06/23/2014  . ELIQUIS 2.5 MG TABS tablet Take 2.5 mg by mouth 2 (two) times daily.    09/10/2014 at 1800  . furosemide (LASIX) 40 MG tablet Take 1 tablet (40 mg total) by mouth daily. 30 tablet 0 09/10/2014 at Unknown time  . HYDROcodone-acetaminophen (NORCO) 7.5-325 MG per tablet Take 1 tablet by mouth every 6 (six) hours as needed for pain.   09/10/2014 at Unknown time  . letrozole (FEMARA) 2.5 MG tablet Take 1 tablet (2.5 mg total) by mouth daily. 90 tablet 4 09/10/2014 at Unknown time  . metoprolol (LOPRESSOR) 100 MG tablet Take 100 mg by mouth 2 (two) times daily.   09/10/2014 at 1800  . Multiple Vitamin (MULTIVITAMIN WITH MINERALS) TABS Take 1 tablet by mouth every morning. Centrum 50+   09/10/2014 at Unknown time  . olmesartan (BENICAR) 20 MG tablet Take 20 mg by mouth daily.   09/10/2014 at Unknown time  . polyethylene glycol (MIRALAX / GLYCOLAX) packet Take 17 g by mouth every morning.    Past Week at Unknown time  . potassium chloride SA (K-DUR,KLOR-CON) 20 MEQ tablet Take 20 mEq by mouth daily.    09/10/2014 at Unknown time  . traMADol (ULTRAM) 50 MG tablet Take 1 tablet (50 mg total) by mouth every 8 (eight) hours as needed for pain. 50 tablet 0  09/10/2014 at 2000  . Vitamin D, Ergocalciferol, (DRISDOL) 50000 UNITS CAPS Take 50,000 Units by mouth every 7 (seven) days.   09/04/2014 at Unknown time  . letrozole (FEMARA) 2.5 MG tablet TAKE 1 TABLET BY MOUTH ONCE DAILY (Patient not taking: Reported on 09/11/2014) 90 tablet 2 Not Taking at Unknown time   Infusions:    Assessment: 81yo F on Eliquis at home for A.fib, presents with SOB and acute chest pain. CT showed pneumonia and PE. In the ED, abx were started for CAP and heparin was started at 600units/hr, no bolus. Pharmacy is asked to take over heparin dosing.  Last Eliquis dose was 8/19 at 6pm. This will falsley elevate the antiXa level for up to 72hrs so we will use aPTT to guide dosing.  No baseline coag labs were  drawn.  CBC is wnl.  Goal of Therapy:  Heparin level 0.3-0.7 units/ml aPTT 66-102 seconds Monitor platelets by anticoagulation protocol: Yes   Plan:  Cont heparin IV 600units/hr. Check aPTT and heparin level 8hrs from start, at 1230. Check CBC q24h while on heparin. F/u daily.  Romeo Rabon, PharmD, pager [phone]. 09/11/2014,8:23 AM.

## 2014-09-11 NOTE — ED Notes (Signed)
Patient transported to CT 

## 2014-09-11 NOTE — Progress Notes (Signed)
Pt admitted after midnight Pt is 81 year old female with history of breast CA who presented to [facility] ED with left sided chest pain. Her trop level was WNL. CT angio was not significant for PE but it did show chronic bronchiectasis. Pt was started on azithro and rocpehin for pneumonia. Please refer to admission note for more details on HPI and assessment and plan.   Leisa Lenz [facility]  [id number]

## 2014-09-11 NOTE — Progress Notes (Signed)
Patient's heart rate in the 50s, Amiodarone and metoprolol due, Dr. Charlies Silvers informed and she stated to hold med today. Will continue to monitor patient.

## 2014-09-11 NOTE — Progress Notes (Signed)
ANTICOAGULATION CONSULT NOTE - Follow Up  Pharmacy Consult for Heparin Indication: pulmonary embolus  Allergies  Allergen Reactions  . Codeine Nausea And Vomiting    Patient Measurements: Height: 5\' 4"  (162.6 cm) Weight: 104 lb 0.9 oz (47.2 kg) IBW/kg (Calculated) : 54.7 Heparin Dosing Weight: 47 kg  Vital Signs: Temp: 98 F (36.7 C) (08/20 0644) Temp Source: Oral (08/20 0644) BP: 147/64 mmHg (08/20 0941) Pulse Rate: 53 (08/20 0941)  Labs:  Recent Labs  09/11/14 0115 09/11/14 0831 09/11/14 1306  HGB 13.4  --   --   HCT 41.0  --   --   PLT 208  --   --   APTT  --   --  67*  HEPARINUNFRC  --   --  >2.20*  CREATININE 1.19*  --   --   TROPONINI  --  <0.03  --     Estimated Creatinine Clearance: 23.9 mL/min (by C-G formula based on Cr of 1.19).   Medical History: Past Medical History  Diagnosis Date  . Hypertension   . Osteoporosis   . Scoliosis   . Atrial fibrillation   . Compression fracture 09/21/2012  . Dyslipidemia 09/21/2012  . MVP (mitral valve prolapse) 09/23/2012  . Breast cancer ~ 2006    "left" (10/09/2012)  . Heart murmur   . Shortness of breath     "just now; nose is dry" (10/09/2012)  . History of blood transfusion     "years and years ago; I was anemic" (10/09/2012)  . Cervical spondylolysis 09/21/2012  . Osteoarthritis 09/21/2012  . Arthritis     "hands and legs" (10/09/2012)  . Chronic lower back pain     Medications:  Prescriptions prior to admission  Medication Sig Dispense Refill Last Dose  . ALPRAZolam (XANAX) 0.25 MG tablet Take one tablet by mouth every 12 hours as needed for severe anxiety 60 tablet 5 Past Week at Unknown time  . amiodarone (PACERONE) 200 MG tablet Take 200 mg by mouth every morning.    09/10/2014 at Unknown time  . atorvastatin (LIPITOR) 40 MG tablet Take 40 mg by mouth every evening.    09/10/2014 at Unknown time  . calcium-vitamin D (OSCAL WITH D) 500-200 MG-UNIT per tablet Take 1 tablet by mouth every morning.     09/10/2014 at Unknown time  . denosumab (PROLIA) 60 MG/ML SOLN injection Inject 60 mg into the skin every 6 (six) months. Administer in upper arm, thigh, or abdomen   06/23/2014  . ELIQUIS 2.5 MG TABS tablet Take 2.5 mg by mouth 2 (two) times daily.    09/10/2014 at 1800  . furosemide (LASIX) 40 MG tablet Take 1 tablet (40 mg total) by mouth daily. 30 tablet 0 09/10/2014 at Unknown time  . HYDROcodone-acetaminophen (NORCO) 7.5-325 MG per tablet Take 1 tablet by mouth every 6 (six) hours as needed for pain.   09/10/2014 at Unknown time  . letrozole (FEMARA) 2.5 MG tablet Take 1 tablet (2.5 mg total) by mouth daily. 90 tablet 4 09/10/2014 at Unknown time  . metoprolol (LOPRESSOR) 100 MG tablet Take 100 mg by mouth 2 (two) times daily.   09/10/2014 at 1800  . Multiple Vitamin (MULTIVITAMIN WITH MINERALS) TABS Take 1 tablet by mouth every morning. Centrum 50+   09/10/2014 at Unknown time  . olmesartan (BENICAR) 20 MG tablet Take 20 mg by mouth daily.   09/10/2014 at Unknown time  . polyethylene glycol (MIRALAX / GLYCOLAX) packet Take 17 g by mouth every morning.  Past Week at Unknown time  . potassium chloride SA (K-DUR,KLOR-CON) 20 MEQ tablet Take 20 mEq by mouth daily.    09/10/2014 at Unknown time  . traMADol (ULTRAM) 50 MG tablet Take 1 tablet (50 mg total) by mouth every 8 (eight) hours as needed for pain. 50 tablet 0 09/10/2014 at 2000  . Vitamin D, Ergocalciferol, (DRISDOL) 50000 UNITS CAPS Take 50,000 Units by mouth every 7 (seven) days.   09/04/2014 at Unknown time  . letrozole (Hopkins) 2.5 MG tablet TAKE 1 TABLET BY MOUTH ONCE DAILY (Patient not taking: Reported on 09/11/2014) 90 tablet 2 Not Taking at Unknown time   Infusions:  . heparin 600 Units/hr (09/11/14 0930)    Assessment: 81yo F on Eliquis at home for A.fib, presents with SOB and acute chest pain. CT showed pneumonia and PE. In the ED, abx were started for CAP and heparin was started at 600units/hr, no bolus. Pharmacy is asked to take over  heparin dosing.  Last Eliquis dose was 8/19 at 6pm. This will falsley elevate the antiXa level for up to 72hrs so we will use aPTT to guide dosing.  CBC is wnl.  Today, 09/11/2014  HL > 2.2 as expected with last dose of Eliquis being 8/19 at 6pm.   APTT therapeutic on current IV heparin rate of 600 units/hr  No reported bleeding  Goal of Therapy:  Heparin level 0.3-0.7 units/ml aPTT 66-102 seconds Monitor platelets by anticoagulation protocol: Yes   Plan:  1) Cont heparin IV 600units/hr. 2) Recheck aPTT and heparin level in 8 hrs to confirm goal aPTT and see if HL starts to decrease and correlate better with aPTT as Eliquis is eliminated 3) Check CBC q24h while on heparin.   Adrian Saran, PharmD, BCPS Pager [phone] 09/11/2014 2:19 PM

## 2014-09-11 NOTE — Progress Notes (Signed)
VASCULAR LAB PRELIMINARY  PRELIMINARY  PRELIMINARY  PRELIMINARY  Bilateral lower extremity venous duplex  completed.    Preliminary report:  Bilateral:  No evidence of DVT, superficial thrombosis, or Baker's Cyst.    Nicholad Kautzman, RVT 09/11/2014, 11:28 AM

## 2014-09-11 NOTE — ED Notes (Signed)
Bed: [id number] Expected date:  Expected time:  Means of arrival:  Comments: 78F LUQ pain

## 2014-09-11 NOTE — ED Notes (Signed)
Pt placed on 2L [place] O2 due to drop in oxygen when she fell asleep her O2 decreased to 88 %

## 2014-09-11 NOTE — ED Notes (Signed)
Pt presents via EMS c/o LUQ intermittent abdominal pain rated 10/10 that "just hurts" x 2 hours ago.  She denies nausea, vomiting, diarrhea, fever, chills, sweating, chest pain, and radiation to any other locations. Pt is alert and oriented x 4. Bowel sounds in all quadrants.  No guarding or withdrawal from palpation. Vital signs WNL per EMS report.

## 2014-09-11 NOTE — H&P (Signed)
[facility] History and Physical  Patient: Holly Weaver  MRN: [id number]  DOB: Mar 02, 1923  DOS: the patient was seen and examined on 09/11/2014 PCP: [name], MD  Referring physician: Dr. Randal Buba Chief Complaint: Left-sided chest pain  HPI: Holly Weaver is a 81 y.o. female with Past medical history of breast cancer, hypertension, atrial fibrillation on chronic anticoagulation. The patient is presenting with complaints of left-sided chest pain. She denies having any similar chest and in the past and started having this chest pain just today. She denies any fall trauma or injury. Denies any fever or chills. Complains of shortness of breath as well as cough without any expectoration. Denies any nausea vomiting or choking episode. Denies any diarrhea or constipation. No burning urination no active bleeding. She is compliant with all her medication. Next and she mentions about leg swelling which is progressively worsening.  The patient is coming from home.  At her baseline ambulates with walker but mostly bedbound And is independent for most of her ADL manages her medication on her own.  Review of Systems: as mentioned in the history of present illness.  A comprehensive review of the other systems is negative.  Past Medical History  Diagnosis Date  . Hypertension   . Osteoporosis   . Scoliosis   . Atrial fibrillation   . Compression fracture 09/21/2012  . Dyslipidemia 09/21/2012  . MVP (mitral valve prolapse) 09/23/2012  . Breast cancer ~ 2006    "left" (10/09/2012)  . Heart murmur   . Shortness of breath     "just now; nose is dry" (10/09/2012)  . History of blood transfusion     "years and years ago; I was anemic" (10/09/2012)  . Cervical spondylolysis 09/21/2012  . Osteoarthritis 09/21/2012  . Arthritis     "hands and legs" (10/09/2012)  . Chronic lower back pain    Past Surgical History  Procedure Laterality Date  . Femur im nail Left 09/21/2012   Procedure: INTRAMEDULLARY (IM) NAIL FEMORAL;  Surgeon: Newt Minion, MD;  Location: [facility];  Service: Orthopedics;  Laterality: Left;  . Hernia repair  1950's?  . Cataract extraction w/ intraocular lens implant Left 2000's  . Breast biopsy Left ~ 2006  . Cardiac catheterization  11/1998   Social History:  reports that she has never smoked. She has never used smokeless tobacco. She reports that she does not drink alcohol or use illicit drugs.  Allergies  Allergen Reactions  . Codeine Nausea And Vomiting    Family History  Problem Relation Age of Onset  . Cancer Mother   . CAD Father   . Cancer Other     Prior to Admission medications   Medication Sig Start Date End Date Taking? Authorizing Provider  ALPRAZolam Duanne Moron) 0.25 MG tablet Take one tablet by mouth every 12 hours as needed for severe anxiety 09/30/12  Yes Lauree Chandler, NP  amiodarone (PACERONE) 200 MG tablet Take 200 mg by mouth every morning.  12/06/11  Yes Kristin L Alvstad, RPH-CPP  atorvastatin (LIPITOR) 40 MG tablet Take 40 mg by mouth every evening.    Yes Historical Provider, MD  calcium-vitamin D (OSCAL WITH D) 500-200 MG-UNIT per tablet Take 1 tablet by mouth every morning.    Yes Historical Provider, MD  denosumab (PROLIA) 60 MG/ML SOLN injection Inject 60 mg into the skin every 6 (six) months. Administer in upper arm, thigh, or abdomen   Yes Historical Provider, MD  ELIQUIS 2.5 MG TABS tablet Take  2.5 mg by mouth 2 (two) times daily.  03/10/13  Yes Historical Provider, MD  furosemide (LASIX) 40 MG tablet Take 1 tablet (40 mg total) by mouth daily. 10/14/12  Yes [name], MD  HYDROcodone-acetaminophen (NORCO) 7.5-325 MG per tablet Take 1 tablet by mouth every 6 (six) hours as needed for pain.   Yes Historical Provider, MD  letrozole (FEMARA) 2.5 MG tablet Take 1 tablet (2.5 mg total) by mouth daily. 05/31/14  Yes [name], MD  metoprolol (LOPRESSOR) 100 MG tablet Take 100 mg by mouth 2 (two) times daily.    Yes Historical Provider, MD  Multiple Vitamin (MULTIVITAMIN WITH MINERALS) TABS Take 1 tablet by mouth every morning. Centrum 50+   Yes Historical Provider, MD  olmesartan (BENICAR) 20 MG tablet Take 20 mg by mouth daily.   Yes Historical Provider, MD  polyethylene glycol (MIRALAX / GLYCOLAX) packet Take 17 g by mouth every morning.    Yes Historical Provider, MD  potassium chloride SA (K-DUR,KLOR-CON) 20 MEQ tablet Take 20 mEq by mouth daily.  04/13/13  Yes Historical Provider, MD  traMADol (ULTRAM) 50 MG tablet Take 1 tablet (50 mg total) by mouth every 8 (eight) hours as needed for pain. 09/24/12  Yes Orson Eva, MD  Vitamin D, Ergocalciferol, (DRISDOL) 50000 UNITS CAPS Take 50,000 Units by mouth every 7 (seven) days.   Yes Historical Provider, MD  letrozole [facility]) 2.5 MG tablet TAKE 1 TABLET BY MOUTH ONCE DAILY Patient not taking: Reported on 09/11/2014 07/07/14   [name], MD    Physical Exam: Filed Vitals:   09/11/14 0051 09/11/14 0249 09/11/14 0644  BP:  182/46 138/79  Pulse:  55 56  Temp:   98 F (36.7 C)  TempSrc:   Oral  Resp:  11 21  Height: 5\' 4"  (1.626 m)  5\' 4"  (1.626 m)  Weight: 48.535 kg (107 lb)  47.2 kg (104 lb 0.9 oz)  SpO2:  93% 99%    General: Alert, Awake and Oriented to Time, Place and Person. Appear in mild distress Eyes: PERRL ENT: Oral Mucosa clear moist. Neck: no JVD Cardiovascular: S1 and S2 Present, no Murmur, Peripheral Pulses Present Respiratory: Bilateral Air entry equal and Decreased,  Bilateral Crackles, occasional wheezes Abdomen: Bowel Sound present, Soft and no tenderness Skin: no Rash Extremities: Bilateral Pedal edema, no calf tenderness Neurologic: Grossly no focal neuro deficit.  Labs on Admission:  CBC:  Recent Labs Lab 09/11/14 0115  WBC 6.1  HGB 13.4  HCT 41.0  MCV 102.5*  PLT 208    CMP     Component Value Date/Time   NA 137 09/11/2014 0115   NA 141 05/31/2014 1447   K 4.1 09/11/2014 0115   K 3.4* 05/31/2014  1447   CL 99* 09/11/2014 0115   CO2 28 09/11/2014 0115   CO2 30* 05/31/2014 1447   GLUCOSE 124* 09/11/2014 0115   GLUCOSE 104 05/31/2014 1447   BUN 22* 09/11/2014 0115   BUN 18.0 05/31/2014 1447   CREATININE 1.19* 09/11/2014 0115   CREATININE 1.0 05/31/2014 1447   CALCIUM 9.2 09/11/2014 0115   CALCIUM 9.5 05/31/2014 1447   PROT 7.5 09/11/2014 0115   PROT 6.8 05/31/2014 1447   ALBUMIN 4.2 09/11/2014 0115   ALBUMIN 3.9 05/31/2014 1447   AST 31 09/11/2014 0115   AST 28 05/31/2014 1447   ALT 20 09/11/2014 0115   ALT 25 05/31/2014 1447   ALKPHOS 59 09/11/2014 0115   ALKPHOS 63 05/31/2014 1447  BILITOT 1.0 09/11/2014 0115   BILITOT 1.04 05/31/2014 1447   GFRNONAA 39* 09/11/2014 0115   GFRAA 46* 09/11/2014 0115     Recent Labs Lab 09/11/14 0115  LIPASE 29    No results for input(s): CKTOTAL, CKMB, CKMBINDEX, TROPONINI in the last 168 hours. BNP (last 3 results) No results for input(s): BNP in the last 8760 hours.  ProBNP (last 3 results) No results for input(s): PROBNP in the last 8760 hours.   Radiological Exams on Admission: Ct Angio Chest Pe W/cm &/or Wo Cm  09/11/2014   CLINICAL DATA:  Breast cancer and atrial fibrillation.  Chest pain.  EXAM: CT ANGIOGRAPHY CHEST WITH CONTRAST  TECHNIQUE: Multidetector CT imaging of the chest was performed using the standard protocol during bolus administration of intravenous contrast. Multiplanar CT image reconstructions and MIPs were obtained to evaluate the vascular anatomy.  CONTRAST:  180mL OMNIPAQUE IOHEXOL 350 MG/ML SOLN  COMPARISON:  10/10/2012  FINDINGS: THORACIC INLET/BODY WALL:  14 mm nodule in left thyroid gland appears stable from 2014, as does a inferior isthmic nodule measuring 11 mm.  Left breast surgery and left axillary dissection.  MEDIASTINUM:  Biatrial enlargement. No pericardial effusion. No evidence of left atrial appendage thrombus.  There is mixing artifact in the pulmonary arteries from early timing and turbulent  flow, a limiting factor. In the medial segment right middle lobe there is subsegmental branch with no visible flow. The termination of flow was relatively abrupt without branching into the neighboring vessels. In the medial basilar segments there are pulmonary arteries with gradual tapered truncated appearance compatible with shunting in the setting of consolidation. There is a linear high-density structure in the right lower lobe pulmonary artery consistent with remote vertebroplasty cement embolus. Just proximal to this area, at the interlobar pulmonary artery bifurcation, there is poor flow along the outer wall which could reflect a chronic clot.  Moderate hiatal hernia.  LUNG WINDOWS:  Hyperinflated lungs. There is dense consolidation in the right lower lobe with bronchiectasis and volume loss. There is stenosis at the level of the right lower lobe bronchus, with occlusion of the medial segment bronchus which is distorted. Similar but less extensive changes present and the medial basilar segment left lower lobe. Patchy ground-glass opacity in the right upper lobe and in the basilar lungs likely reflects scarring. Basilar opacities were obscured on the previous study, the right upper lobe opacity is stable. No edema, effusion, or air leak.  UPPER ABDOMEN:  No acute findings.  OSSEOUS:  Compression fractures of T6, T10, T11, T12, L1, and L2 superior endplate all have a chronic appearance. The T11 fracture has occurred since 2014. The patient is status post vertebroplasty at T12 and L1. Bilateral rib fractures appear chronic.  Review of the MIP images confirms the above findings.  IMPRESSION: 1. Thrombosis of a right middle lobe subsegmental pulmonary artery, chronicity uncertain. Consider lower extremity Dopplers. 2. Consolidation in the right more than left lower lobes is favored chronic given bronchiectasis, bronchial stenoses/occlusions and poor blood flow. Cannot exclude superimposed acute pneumonia or  aspiration. Chest x-ray follow-up in 6-12 weeks is suggested. 3. Hiatal hernia.   Electronically Signed   By: Monte Fantasia M.D.   On: 09/11/2014 04:02   Dg Abd Acute W/chest  09/11/2014   CLINICAL DATA:  Left upper quadrant pain  EXAM: DG ABDOMEN ACUTE W/ 1V CHEST  COMPARISON:  10/12/2012  FINDINGS: Chronic cardiomegaly. The aorta is tortuous, as before. At least moderate hiatal hernia is present.  Hyperinflation and interstitial coarsening with streaky basilar lung opacities that significantly clear on abdominal imaging - consistent with atelectasis. No edema, effusion, or air leak.  No evidence of bowel obstruction or perforation. No concerning intra-abdominal mass effect or calcification.  Thoracolumbar dextroscoliosis. Lower thoracic compression deformities with 2 level vertebroplasty.  Proximal left femur ORIF with unusual eccentric appearance of the femoral neck screw, stable from 10/08/2012.  IMPRESSION: No acute findings at the chest or abdomen.   Electronically Signed   By: Monte Fantasia M.D.   On: 09/11/2014 02:35   EKG: Independently reviewed. normal sinus rhythm, nonspecific ST and T waves changes.  Assessment/Plan Principal Problem:   CAP (community acquired pneumonia) Active Problems:   PAF (paroxysmal atrial fibrillation), maintaining SR   Long term current use of anticoagulant therapy   Hypertension   CAD in native artery, non obstructive by cath 2000   GERD (gastroesophageal reflux disease)   Breast cancer, left breast   1. CAP (community acquired pneumonia) The patient is presenting with complaints of progressively worsening shortness of breath as well as left-sided chest pain. Patient had a CT scan to rule out PE which is showing chronic bronchiectasis with bilateral pneumonia. With this I would place the patient on community-acquired coverage for ceftriaxone azithromycin. Monitor her on telemetry.  2. Pulmonary thromboembolism. The patient's CT scan is positive for  thromboembolism of the pulmonary artery. Patient does not appear in any hemodynamic instability. It is unclear whether this is chronic or acute. Patient is already on anticoagulation. With this the question of failed anticoagulation need to be decided by hematology oncology. Currently I will discontinue her liquids and place her on heparin. Check lower extremity Doppler.  3. Atrial fibrillation. Continue amiodarone. Also continue Lopressor.  4. Chronic diastolic dysfunction. Does not appear in volume overload. Continue home Lasix.  5. Left breast cancer. Continue letrozole.  Advance goals of care discussion: Full code as per my discussion with patient, her daughter will be power of attorney   DVT Prophylaxis: on therapeutic anticoagulation. Nutrition: Regular diet  Disposition: Admitted as inpatient, telemetry unit.  Author: Berle Mull, MD [facility] Pager: [phone] 09/11/2014  If 7PM-7AM, please contact night-coverage [email or web address] Password [email or web address]

## 2014-09-11 NOTE — ED Provider Notes (Signed)
CSN: [id number]     Arrival date & time 09/11/14  0045 History  This chart was scribed for Holly Fore, MD by Julien Nordmann, ED Scribe. This patient was seen in room WA24/WA24 and the patient's care was started at 1:24 AM.    Chief Complaint  Patient presents with  . Abdominal Pain      Patient is a 81 y.o. female presenting with abdominal pain. The history is provided by the patient and the EMS personnel. No language interpreter was used.  Abdominal Pain Pain location:  LUQ Pain quality: sharp   Pain radiates to:  Does not radiate Pain severity:  Severe Onset quality:  Gradual Timing:  Constant Progression:  Worsening Context: not laxative use and not trauma   Relieved by:  Nothing Worsened by:  Nothing tried Ineffective treatments: ultram. Associated symptoms: no cough, no diarrhea, no fever, no nausea, no shortness of breath and no vomiting   Risk factors: no alcohol abuse    HPI Comments: Holly Weaver is a 81 y.o. female who has breast cancer, afib, scolosis, dyslipidemia and osteoarthritis presents to the Emergency Department complaining of intermittent, gradual worsening LUQ abdominal pain onset 2 hours ago. Per family, pt has been having this pain for the past week but notes it was worse tonight when she felt this pain. She notes she felt like she had broken a rib. Pt reports having stabbing pains in her that area after sitting in a recliner. She states she took tramadol to alleviate the pain with no relief. Pt denies nausea, vomiting, diarrhea, fever, shingles, and cough.  Past Medical History  Diagnosis Date  . Hypertension   . Osteoporosis   . Scoliosis   . Atrial fibrillation   . Compression fracture 09/21/2012  . Dyslipidemia 09/21/2012  . MVP (mitral valve prolapse) 09/23/2012  . Breast cancer ~ 2006    "left" (10/09/2012)  . Heart murmur   . Shortness of breath     "just now; nose is dry" (10/09/2012)  . History of blood transfusion     "years and years  ago; I was anemic" (10/09/2012)  . Cervical spondylolysis 09/21/2012  . Osteoarthritis 09/21/2012  . Arthritis     "hands and legs" (10/09/2012)  . Chronic lower back pain    Past Surgical History  Procedure Laterality Date  . Femur im nail Left 09/21/2012    Procedure: INTRAMEDULLARY (IM) NAIL FEMORAL;  Surgeon: Newt Minion, MD;  Location: [facility];  Service: Orthopedics;  Laterality: Left;  . Hernia repair  1950's?  . Cataract extraction w/ intraocular lens implant Left 2000's  . Breast biopsy Left ~ 2006  . Cardiac catheterization  11/1998   Family History  Problem Relation Age of Onset  . Cancer Mother   . CAD Father   . Cancer Other    Social History  Substance Use Topics  . Smoking status: Never Smoker   . Smokeless tobacco: Never Used  . Alcohol Use: No   OB History    No data available     Review of Systems  Constitutional: Negative for fever.  Respiratory: Negative for cough and shortness of breath.   Gastrointestinal: Positive for abdominal pain. Negative for nausea, vomiting and diarrhea.  All other systems reviewed and are negative.     Allergies  Codeine  Home Medications   Prior to Admission medications   Medication Sig Start Date End Date Taking? Authorizing Provider  alendronate (FOSAMAX) 70 MG tablet Take 70 mg by mouth  every 7 (seven) days. Take with a full glass of water on an empty stomach.    Historical Provider, MD  ALPRAZolam Duanne Moron) 0.25 MG tablet Take one tablet by mouth every 12 hours as needed for severe anxiety 09/30/12   Lauree Chandler, NP  amiodarone (PACERONE) 200 MG tablet Take 200 mg by mouth every morning.  12/06/11   Rockne Menghini, RPH-CPP  aspirin EC 81 MG tablet Take 81 mg by mouth every morning.     Historical Provider, MD  atorvastatin (LIPITOR) 40 MG tablet Take 40 mg by mouth every evening.     Historical Provider, MD  calcium-vitamin D (OSCAL WITH D) 500-200 MG-UNIT per tablet Take 1 tablet by mouth every morning.      Historical Provider, MD  ELIQUIS 2.5 MG TABS tablet  03/10/13   Historical Provider, MD  furosemide (LASIX) 40 MG tablet Take 1 tablet (40 mg total) by mouth daily. 10/14/12   [name], MD  HYDROcodone-acetaminophen (NORCO) 7.5-325 MG per tablet Take 1 tablet by mouth every 6 (six) hours as needed for pain.    Historical Provider, MD  letrozole (FEMARA) 2.5 MG tablet Take 1 tablet (2.5 mg total) by mouth daily. 05/31/14   [name], MD  letrozole [facility]) 2.5 MG tablet TAKE 1 TABLET BY MOUTH ONCE DAILY 07/07/14   [name], MD  metoprolol (LOPRESSOR) 100 MG tablet Take 100 mg by mouth 2 (two) times daily.    Historical Provider, MD  Multiple Vitamin (MULTIVITAMIN WITH MINERALS) TABS Take 1 tablet by mouth every morning. Centrum 50+    Historical Provider, MD  ondansetron (ZOFRAN) 4 MG tablet Take 1 tablet (4 mg total) by mouth every 6 (six) hours as needed for nausea. 10/14/12   [name], MD  polyethylene glycol [facility] Floria Raveling) packet Take 17 g by mouth every morning.     Historical Provider, MD  potassium chloride SA (K-DUR,KLOR-CON) 20 MEQ tablet  04/13/13   Historical Provider, MD  traMADol (ULTRAM) 50 MG tablet Take 1 tablet (50 mg total) by mouth every 8 (eight) hours as needed for pain. 09/24/12   Orson Eva, MD  Vitamin D, Ergocalciferol, (DRISDOL) 50000 UNITS CAPS Take 50,000 Units by mouth every 7 (seven) days.    Historical Provider, MD   Ht 5\' 4"  (1.626 m)  Wt 107 lb (48.535 kg)  BMI 18.36 kg/m2 Physical Exam  Constitutional: She is oriented to person, place, and time. She appears well-developed and well-nourished. No distress.  HENT:  Head: Normocephalic.  Mouth/Throat: Oropharynx is clear and moist. No oropharyngeal exudate.  Eyes: Conjunctivae are normal. Pupils are equal, round, and reactive to light. No scleral icterus.  Neck: Normal range of motion. Neck supple. No thyromegaly present.  No lymphnodes  Cardiovascular: Normal rate and regular rhythm.  Exam  reveals no gallop and no friction rub.   No murmur heard. Pulmonary/Chest: Effort normal and breath sounds normal. No respiratory distress. She has no wheezes. She has no rales. She exhibits no tenderness.  Lungs are clear  Abdominal: Soft. She exhibits no distension. There is no tenderness. There is no rebound and no guarding.  Hyperactive bowel sounds in bilateral upper quadrants and epigastrum  Musculoskeletal: Normal range of motion. She exhibits no edema or tenderness.  No crepitus  Neurological: She is alert and oriented to person, place, and time. She has normal reflexes.  Intact dorsalis pedis  Skin: Skin is warm and dry. No rash noted.  No lesions  Psychiatric:  She has a normal mood and affect. Her behavior is normal.  Nursing note and vitals reviewed.   ED Course  Procedures  COORDINATION OF CARE:  1:28 AM Discussed treatment plan which includes pain medication with pt at bedside and pt agreed to plan.  Labs Review Labs Reviewed  LIPASE, BLOOD  COMPREHENSIVE METABOLIC PANEL  CBC  URINALYSIS, ROUTINE W REFLEX MICROSCOPIC (NOT AT [facility])  I-STAT TROPOININ, ED    Imaging Review No results found. I have personally reviewed and evaluated these images and lab results as part of my medical decision-making.   EKG Interpretation   Date/Time:  Saturday September 11 2014 01:14:58 EDT Ventricular Rate:  58 PR Interval:  203 QRS Duration: 104 QT Interval:  488 QTC Calculation: 479 R Axis:   69 Text Interpretation:  Sinus rhythm Confirmed by [facility]  MD, Louetta Hollingshead  ([id number]) on 09/11/2014 1:30:58 AM      MDM   Final diagnoses:  None   Results for orders placed or performed during the hospital encounter of 09/11/14  Lipase, blood  Result Value Ref Range   Lipase 29 22 - 51 U/L  Comprehensive metabolic panel  Result Value Ref Range   Sodium 137 135 - 145 mmol/L   Potassium 4.1 3.5 - 5.1 mmol/L   Chloride 99 (L) 101 - 111 mmol/L   CO2 28 22 - 32 mmol/L   Glucose,  Bld 124 (H) 65 - 99 mg/dL   BUN 22 (H) 6 - 20 mg/dL   Creatinine, Ser 1.19 (H) 0.44 - 1.00 mg/dL   Calcium 9.2 8.9 - 10.3 mg/dL   Total Protein 7.5 6.5 - 8.1 g/dL   Albumin 4.2 3.5 - 5.0 g/dL   AST 31 15 - 41 U/L   ALT 20 14 - 54 U/L   Alkaline Phosphatase 59 38 - 126 U/L   Total Bilirubin 1.0 0.3 - 1.2 mg/dL   GFR calc non Af Amer 39 (L) >60 mL/min   GFR calc Af Amer 46 (L) >60 mL/min   Anion gap 10 5 - 15  CBC  Result Value Ref Range   WBC 6.1 4.0 - 10.5 K/uL   RBC 4.00 3.87 - 5.11 MIL/uL   Hemoglobin 13.4 12.0 - 15.0 g/dL   HCT 41.0 36.0 - 46.0 %   MCV 102.5 (H) 78.0 - 100.0 fL   MCH 33.5 26.0 - 34.0 pg   MCHC 32.7 30.0 - 36.0 g/dL   RDW 14.5 11.5 - 15.5 %   Platelets 208 150 - 400 K/uL  Urinalysis, Routine w reflex microscopic (not at [facility])  Result Value Ref Range   Color, Urine AMBER (A) YELLOW   APPearance CLOUDY (A) CLEAR   Specific Gravity, Urine 1.021 1.005 - 1.030   pH 5.5 5.0 - 8.0   Glucose, UA NEGATIVE NEGATIVE mg/dL   Hgb urine dipstick NEGATIVE NEGATIVE   Bilirubin Urine SMALL (A) NEGATIVE   Ketones, ur NEGATIVE NEGATIVE mg/dL   Protein, ur NEGATIVE NEGATIVE mg/dL   Urobilinogen, UA 1.0 0.0 - 1.0 mg/dL   Nitrite NEGATIVE NEGATIVE   Leukocytes, UA MODERATE (A) NEGATIVE  Urine microscopic-add on  Result Value Ref Range   Squamous Epithelial / LPF FEW (A) RARE   WBC, UA 21-50 <3 WBC/hpf   Urine-Other MUCOUS PRESENT   I-Stat Troponin, ED (not at [facility])  Result Value Ref Range   Troponin i, poc 0.01 0.00 - 0.08 ng/mL   Comment 3  Ct Angio Chest Pe W/cm &/or Wo Cm  09/11/2014   CLINICAL DATA:  Breast cancer and atrial fibrillation.  Chest pain.  EXAM: CT ANGIOGRAPHY CHEST WITH CONTRAST  TECHNIQUE: Multidetector CT imaging of the chest was performed using the standard protocol during bolus administration of intravenous contrast. Multiplanar CT image reconstructions and MIPs were obtained to evaluate the vascular anatomy.  CONTRAST:  160mL OMNIPAQUE  IOHEXOL 350 MG/ML SOLN  COMPARISON:  10/10/2012  FINDINGS: THORACIC INLET/BODY WALL:  14 mm nodule in left thyroid gland appears stable from 2014, as does a inferior isthmic nodule measuring 11 mm.  Left breast surgery and left axillary dissection.  MEDIASTINUM:  Biatrial enlargement. No pericardial effusion. No evidence of left atrial appendage thrombus.  There is mixing artifact in the pulmonary arteries from early timing and turbulent flow, a limiting factor. In the medial segment right middle lobe there is subsegmental branch with no visible flow. The termination of flow was relatively abrupt without branching into the neighboring vessels. In the medial basilar segments there are pulmonary arteries with gradual tapered truncated appearance compatible with shunting in the setting of consolidation. There is a linear high-density structure in the right lower lobe pulmonary artery consistent with remote vertebroplasty cement embolus. Just proximal to this area, at the interlobar pulmonary artery bifurcation, there is poor flow along the outer wall which could reflect a chronic clot.  Moderate hiatal hernia.  LUNG WINDOWS:  Hyperinflated lungs. There is dense consolidation in the right lower lobe with bronchiectasis and volume loss. There is stenosis at the level of the right lower lobe bronchus, with occlusion of the medial segment bronchus which is distorted. Similar but less extensive changes present and the medial basilar segment left lower lobe. Patchy ground-glass opacity in the right upper lobe and in the basilar lungs likely reflects scarring. Basilar opacities were obscured on the previous study, the right upper lobe opacity is stable. No edema, effusion, or air leak.  UPPER ABDOMEN:  No acute findings.  OSSEOUS:  Compression fractures of T6, T10, T11, T12, L1, and L2 superior endplate all have a chronic appearance. The T11 fracture has occurred since 2014. The patient is status post vertebroplasty at T12  and L1. Bilateral rib fractures appear chronic.  Review of the MIP images confirms the above findings.  IMPRESSION: 1. Thrombosis of a right middle lobe subsegmental pulmonary artery, chronicity uncertain. Consider lower extremity Dopplers. 2. Consolidation in the right more than left lower lobes is favored chronic given bronchiectasis, bronchial stenoses/occlusions and poor blood flow. Cannot exclude superimposed acute pneumonia or aspiration. Chest x-ray follow-up in 6-12 weeks is suggested. 3. Hiatal hernia.   Electronically Signed   By: Monte Fantasia M.D.   On: 09/11/2014 04:02   Dg Abd Acute W/chest  09/11/2014   CLINICAL DATA:  Left upper quadrant pain  EXAM: DG ABDOMEN ACUTE W/ 1V CHEST  COMPARISON:  10/12/2012  FINDINGS: Chronic cardiomegaly. The aorta is tortuous, as before. At least moderate hiatal hernia is present.  Hyperinflation and interstitial coarsening with streaky basilar lung opacities that significantly clear on abdominal imaging - consistent with atelectasis. No edema, effusion, or air leak.  No evidence of bowel obstruction or perforation. No concerning intra-abdominal mass effect or calcification.  Thoracolumbar dextroscoliosis. Lower thoracic compression deformities with 2 level vertebroplasty.  Proximal left femur ORIF with unusual eccentric appearance of the femoral neck screw, stable from 10/08/2012.  IMPRESSION: No acute findings at the chest or abdomen.   Electronically Signed   By:  Monte Fantasia M.D.   On: 09/11/2014 02:35   Medications  vancomycin (VANCOCIN) IVPB 1000 mg/200 mL premix (not administered)  piperacillin-tazobactam (ZOSYN) IVPB 3.375 g (not administered)  ondansetron (ZOFRAN) injection 4 mg (not administered)  heparin ADULT infusion 100 units/mL (25000 units/250 mL) (not administered)  cefTRIAXone (ROCEPHIN) 1 g in dextrose 5 % 50 mL IVPB (0 g Intravenous Stopped 09/11/14 0418)  morphine 2 MG/ML injection 2 mg (2 mg Intravenous Given 09/11/14 0308)  iohexol  (OMNIPAQUE) 350 MG/ML injection 100 mL (100 mLs Intravenous Contrast Given 09/11/14 0310)    I personally performed the services described in this documentation, which was scribed in my presence. The recorded information has been reviewed and is accurate.      Veatrice Kells, MD 09/11/14 [phone]

## 2014-09-11 NOTE — ED Notes (Signed)
Pt states she feels a little nausea,  Pt informed that Dr Randal Buba is coming to speak with her and family about CT results.

## 2014-09-12 LAB — BASIC METABOLIC PANEL
Anion gap: 8 (ref 5–15)
BUN: 16 mg/dL (ref 6–20)
CO2: 30 mmol/L (ref 22–32)
Calcium: 8.3 mg/dL — ABNORMAL LOW (ref 8.9–10.3)
Chloride: 100 mmol/L — ABNORMAL LOW (ref 101–111)
Creatinine, Ser: 1.12 mg/dL — ABNORMAL HIGH (ref 0.44–1.00)
GFR, EST AFRICAN AMERICAN: 49 mL/min — AB (ref >60)
GFR, EST NON AFRICAN AMERICAN: 42 mL/min — AB (ref >60)
Glucose, Bld: 97 mg/dL (ref 65–99)
POTASSIUM: 3.6 mmol/L (ref 3.5–5.1)
SODIUM: 138 mmol/L (ref 135–145)

## 2014-09-12 LAB — HEPARIN LEVEL (UNFRACTIONATED): Heparin Unfractionated: 1.84 IU/mL — ABNORMAL HIGH (ref 0.30–0.70)

## 2014-09-12 LAB — CBC
HEMATOCRIT: 36.4 % (ref 36.0–46.0)
HEMOGLOBIN: 11.7 g/dL — AB (ref 12.0–15.0)
MCH: 32.9 pg (ref 26.0–34.0)
MCHC: 32.1 g/dL (ref 30.0–36.0)
MCV: 102.2 fL — AB (ref 78.0–100.0)
Platelets: 174 10*3/uL (ref 150–400)
RBC: 3.56 MIL/uL — AB (ref 3.87–5.11)
RDW: 14.4 % (ref 11.5–15.5)
WBC: 5.6 10*3/uL (ref 4.0–10.5)

## 2014-09-12 LAB — APTT: APTT: 87 s — AB (ref 24–37)

## 2014-09-12 MED ORDER — HEPARIN (PORCINE) IN NACL 100-0.45 UNIT/ML-% IJ SOLN: 600.0000 [IU]/h | INTRAMUSCULAR | Status: DC

## 2014-09-12 MED ORDER — ENSURE ENLIVE PO LIQD
237.0000 mL | Freq: Two times a day (BID) | ORAL | Status: DC
  Administered 2014-09-12 – 2014-09-15 (×6): 237 mL via ORAL

## 2014-09-12 MED ORDER — APIXABAN 2.5 MG PO TABS
2.5000 mg | ORAL_TABLET | Freq: Two times a day (BID) | ORAL | Status: DC
  Administered 2014-09-12 – 2014-09-13 (×3): 2.5 mg via ORAL
  Filled 2014-09-12 (×3): qty 1

## 2014-09-12 NOTE — Progress Notes (Signed)
ANTICOAGULATION CONSULT NOTE - Follow Up  Pharmacy Consult for Heparin Indication: pulmonary embolus  Allergies  Allergen Reactions  . Codeine Nausea And Vomiting    Patient Measurements: Height: 5\' 4"  (162.6 cm) Weight: 104 lb 0.9 oz (47.2 kg) IBW/kg (Calculated) : 54.7 Heparin Dosing Weight: 47 kg  Vital Signs: Temp: 97.6 F (36.4 C) (08/21 0438) Temp Source: Oral (08/21 0438) BP: 160/46 mmHg (08/21 0438) Pulse Rate: 50 (08/21 0438)  Labs:  Recent Labs  09/11/14 0115 09/11/14 0831 09/11/14 1306 09/11/14 2047 09/12/14 0516  HGB 13.4  --   --   --  11.7*  HCT 41.0  --   --   --  36.4  PLT 208  --   --   --  174  APTT  --   --  67* 65* 87*  HEPARINUNFRC  --   --  >2.20* >2.20* 1.84*  CREATININE 1.19*  --   --   --   --   TROPONINI  --  <0.03 <0.03 <0.03  --     Estimated Creatinine Clearance: 23.9 mL/min (by C-G formula based on Cr of 1.19).   Medical History: Past Medical History  Diagnosis Date  . Hypertension   . Osteoporosis   . Scoliosis   . Atrial fibrillation   . Compression fracture 09/21/2012  . Dyslipidemia 09/21/2012  . MVP (mitral valve prolapse) 09/23/2012  . Breast cancer ~ 2006    "left" (10/09/2012)  . Heart murmur   . Shortness of breath     "just now; nose is dry" (10/09/2012)  . History of blood transfusion     "years and years ago; I was anemic" (10/09/2012)  . Cervical spondylolysis 09/21/2012  . Osteoarthritis 09/21/2012  . Arthritis     "hands and legs" (10/09/2012)  . Chronic lower back pain     Medications:  Prescriptions prior to admission  Medication Sig Dispense Refill Last Dose  . ALPRAZolam (XANAX) 0.25 MG tablet Take one tablet by mouth every 12 hours as needed for severe anxiety 60 tablet 5 Past Week at Unknown time  . amiodarone (PACERONE) 200 MG tablet Take 200 mg by mouth every morning.    09/10/2014 at Unknown time  . atorvastatin (LIPITOR) 40 MG tablet Take 40 mg by mouth every evening.    09/10/2014 at Unknown time  .  calcium-vitamin D (OSCAL WITH D) 500-200 MG-UNIT per tablet Take 1 tablet by mouth every morning.    09/10/2014 at Unknown time  . denosumab (PROLIA) 60 MG/ML SOLN injection Inject 60 mg into the skin every 6 (six) months. Administer in upper arm, thigh, or abdomen   06/23/2014  . ELIQUIS 2.5 MG TABS tablet Take 2.5 mg by mouth 2 (two) times daily.    09/10/2014 at 1800  . furosemide (LASIX) 40 MG tablet Take 1 tablet (40 mg total) by mouth daily. 30 tablet 0 09/10/2014 at Unknown time  . HYDROcodone-acetaminophen (NORCO) 7.5-325 MG per tablet Take 1 tablet by mouth every 6 (six) hours as needed for pain.   09/10/2014 at Unknown time  . letrozole (FEMARA) 2.5 MG tablet Take 1 tablet (2.5 mg total) by mouth daily. 90 tablet 4 09/10/2014 at Unknown time  . metoprolol (LOPRESSOR) 100 MG tablet Take 100 mg by mouth 2 (two) times daily.   09/10/2014 at 1800  . Multiple Vitamin (MULTIVITAMIN WITH MINERALS) TABS Take 1 tablet by mouth every morning. Centrum 50+   09/10/2014 at Unknown time  . olmesartan (BENICAR) 20 MG tablet  Take 20 mg by mouth daily.   09/10/2014 at Unknown time  . polyethylene glycol (MIRALAX / GLYCOLAX) packet Take 17 g by mouth every morning.    Past Week at Unknown time  . potassium chloride SA (K-DUR,KLOR-CON) 20 MEQ tablet Take 20 mEq by mouth daily.    09/10/2014 at Unknown time  . traMADol (ULTRAM) 50 MG tablet Take 1 tablet (50 mg total) by mouth every 8 (eight) hours as needed for pain. 50 tablet 0 09/10/2014 at 2000  . Vitamin D, Ergocalciferol, (DRISDOL) 50000 UNITS CAPS Take 50,000 Units by mouth every 7 (seven) days.   09/04/2014 at Unknown time  . letrozole (Saks) 2.5 MG tablet TAKE 1 TABLET BY MOUTH ONCE DAILY (Patient not taking: Reported on 09/11/2014) 90 tablet 2 Not Taking at Unknown time   Infusions:  . heparin 600 Units/hr (09/11/14 0930)    Assessment: 81yo F on Eliquis at home for A.fib, presents with SOB and acute chest pain. CT showed pneumonia and PE. In the ED, abx  were started for CAP and heparin was started at 600units/hr, no bolus. Pharmacy is asked to take over heparin dosing.  Last Eliquis dose was 8/19 at 6pm. This will falsley elevate the antiXa level for up to 72hrs so we will use aPTT to guide dosing.  CBC is wnl.  Today, 09/12/2014  HL supratherapeutic but now falling - continued elevation due to Eliquis with last dose 8/19 at 6pm.   APTT continues to be therapeutic on current IV heparin rate of 600 units/hr  No reported bleeding  Goal of Therapy:  Heparin level 0.3-0.7 units/ml aPTT 66-102 seconds Monitor platelets by anticoagulation protocol: Yes   Plan:  1) Cont heparin IV 600units/hr. 2) Daily aPTT/HL and see if HL starts to decrease and correlate better with aPTT as Eliquis is eliminated at which time will monitor only heparin levels again 3) Check CBC q24h while on heparin.   Adrian Saran, PharmD, BCPS Pager [phone] 09/12/2014 8:58 AM

## 2014-09-12 NOTE — Progress Notes (Signed)
Utilization review completed.  

## 2014-09-12 NOTE — Progress Notes (Signed)
ANTICOAGULATION CONSULT NOTE - Follow Up  Pharmacy Consult for Apixaban (Eliquis) Indication: pulmonary embolus  Allergies  Allergen Reactions  . Codeine Nausea And Vomiting    Patient Measurements: Height: 5\' 4"  (162.6 cm) Weight: 104 lb 0.9 oz (47.2 kg) IBW/kg (Calculated) : 54.7  Vital Signs: Temp: 97.6 F (36.4 C) (08/21 0438) Temp Source: Oral (08/21 0438) BP: 146/37 mmHg (08/21 0921) Pulse Rate: 53 (08/21 0921)  Labs:  Recent Labs  09/11/14 0115 09/11/14 0831 09/11/14 1306 09/11/14 2047 09/12/14 0516  HGB 13.4  --   --   --  11.7*  HCT 41.0  --   --   --  36.4  PLT 208  --   --   --  174  APTT  --   --  67* 65* 87*  HEPARINUNFRC  --   --  >2.20* >2.20* 1.84*  CREATININE 1.19*  --   --   --   --   TROPONINI  --  <0.03 <0.03 <0.03  --     Estimated Creatinine Clearance: 23.9 mL/min (by C-G formula based on Cr of 1.19).  Assessment: 81yo F on Eliquis (reduced dose) at home for A.fib, presents with SOB and acute chest pain. CT showed pneumonia and PE. In the ED, abx were started for CAP and heparin was started. Pharmacy was consulted to continue Heparin dosing initially, but is now consulted to resume Apixaban dosing.   Last Eliquis dose was given 8/19 at 6pm.  Heparin infusing at 600 ml/hr with therapeutic aPTT = 87 this morning.  Most recent SCr 1.19 (8/20) with CrCl ~ 24 ml/min  Renal function remains borderline.  Note: Patients with CrCl <25 mL/minute were excluded from clinical trials   Pt continues to meet criteria for reduced dose apixaban d/t weight < 60kg and age > 35 years.     Goal of Therapy:  Heparin level 0.3-0.7 units/ml aPTT 66-102 seconds Monitor platelets by anticoagulation protocol: Yes   Plan:   D/C heparin IV 600 units/hr (per RN, drip d/c 8/21 ~ 1050 AM)  D/C aPTT/HL   Start Eliquis 2.5 mg PO BID  Repeat BMET today.  Follow up CBC and renal function.   Gretta Arab PharmD, BCPS Pager [phone] 09/12/2014 10:55  AM

## 2014-09-12 NOTE — Consult Note (Signed)
WOC wound consult note Reason for Consult: Pressure Injury (Stage 1) to coccyx Wound type:pressure Pressure Ulcer POA: Yes Measurement: 3cm x 5cm with no depth (non-blanching erythema Wound [id number] Drainage (amount, consistency, odor) none Periwound:intact, dry Dressing procedure/placement/frequency: Patient finds it difficult to lie on side due to osteoporosis and previous history of fractured hip on right.  Daughter states that she is up more than in bed when she is home.  I will add a pressure redistribution chair pad for OOB use, encourage side to side positioning using a flat pillow beneath her buttocks for a subtle shift in pressure. In addition, a sacral soft silicone foam dressing will be placed on the sacrum for additional padding and protection. Thank you for this consultation.  The [facility] nursing team will not follow, but will remain available to this patient, the nursing and medical team.  Please re-consult if needed. Thanks, Maudie Flakes, MSN, RN, [place], [place], [place] [phone])

## 2014-09-12 NOTE — Progress Notes (Signed)
Initial Nutrition Assessment  DOCUMENTATION CODES:   Severe malnutrition in context of chronic illness, Underweight  INTERVENTION:   Provide Ensure Enlive po BID, each supplement provides 350 kcal and 20 grams of protein Encourage PO intake RD to continue to monitor  NUTRITION DIAGNOSIS:   Malnutrition related to chronic illness as evidenced by percent weight loss, moderate depletion of body fat, severe depletion of muscle mass.  GOAL:   Patient will meet greater than or equal to 90% of their needs  MONITOR:   PO intake, Supplement acceptance, Labs, Weight trends, Skin, I & O's  REASON FOR ASSESSMENT:   Consult Assessment of nutrition requirement/status  ASSESSMENT:   81 y.o. female with past medical history of breast cancer, hypertension, atrial fibrillation on anticoagulation with apixaban who presented to [facility] long hospital with reports of left-sided chest pain started on the day prior to the admission. Patient also reported associated shortness of breath and cough without fever.  Pt reports poor appetite, however she has eaten better today (100% completion). Pt would like Ensure supplements ordered, RD to order. Per weight history, pt has lost 9 lb since 6/03 (8% weight loss x 3 months, significant for time frame). Pt is underweight.  Nutrition-Focused physical exam completed. Findings are moderate fat depletion, severe muscle depletion, and no edema.   Labs reviewed: Elevated Creatinine  Diet Order:  Diet Heart Room service appropriate?: Yes; Fluid consistency:: Thin  Skin:  Wound (see comment) (skin tears, stage II sacral ulcer)  Last BM:  8/18  Height:   Ht Readings from Last 1 Encounters:  09/11/14 5\' 4"  (1.626 m)    Weight:   Wt Readings from Last 1 Encounters:  09/11/14 104 lb 0.9 oz (47.2 kg)    Ideal Body Weight:  54.5 kg  BMI:  Body mass index is 17.85 kg/(m^2).  Estimated Nutritional Needs:   Kcal:  1400-1600  Protein:   65-75g  Fluid:  1.5L/day  EDUCATION NEEDS:   No education needs identified at this time  Clayton Bibles, MS, RD, LDN Pager: [phone] After Hours Pager: [phone]

## 2014-09-12 NOTE — Progress Notes (Addendum)
Patient ID: Holly Weaver, female   DOB: January 14, 1924, 81 y.o.   MRN: [id number] [facility] PROGRESS NOTE  Holly Weaver [id number] DOB: 28-Jun-1923 DOA: 09/11/2014 PCP: [name], MD  Brief narrative:    81 y.o. female with past medical history of breast cancer, hypertension, atrial fibrillation on anticoagulation with apixaban who presented to [facility] long hospital with reports of left-sided chest pain started on the day prior to the admission. Patient also reported associated shortness of breath and cough without fever. On admission, CT angiogram of the chest revealed thrombosis of her right middle lobe subsegmental pulmonary artery of uncertain chronicity. There is a consultation in the right more than left lower lobe favored chronic given bronchiectasis however cannot exclude superimposed acute pneumonia or aspiration. Chest x-ray in 6-12 weeks is suggested to follow up on resolution of those findings. Lower extremity Doppler did not reveal DVT. Patient was started on empiric azithromycin and Rocephin for treatment of pneumonia.  Anticipated discharge: Likely by 09/14/2014.   Assessment/Plan:    Principal Problem: Acute respiratory failure with hypoxia / lobar pneumonia, unspecified organism - Patient saturating 95% with nasal cannula oxygen support. Slight hypoxia likely secondary to possible pneumonia versus chronic bronchiectasis - Patient is treated empirically for possible pneumonia with azithromycin and Rocephin - Her respiratory status remains stable since the admission  Active Problems:   PAF (paroxysmal atrial fibrillation), maintaining SR / Long term current use of anticoagulant therapy - CHADS vasc score at least 3 - Currently on anticoagulation with heparin drip. We will switch to apixaban per home regimen  - Continue amiodarone - Hold metoprolol due to bradycardia       Hypertension, essential  - We will continue Lasix. Hold metoprolol because of  bradycardia    Breast cancer, left breast - Continue letrozole    Dyslipidemia - Continue Lipitor 40 mg daily    Pressure ulcer - Appreciate WOC assessment    DVT Prophylaxis  - On AC with heparin at this time, change to apixaban starting today   Code Status: Full.  Family Communication:  plan of care discussed with the patient Disposition Plan: Home by 09/14/2014.   IV access:  Peripheral IV  Procedures and diagnostic studies:    Ct Angio Chest Pe W/cm &/or Wo Cm 09/11/2014  1. Thrombosis of a right middle lobe subsegmental pulmonary artery, chronicity uncertain. Consider lower extremity Dopplers. 2. Consolidation in the right more than left lower lobes is favored chronic given bronchiectasis, bronchial stenoses/occlusions and poor blood flow. Cannot exclude superimposed acute pneumonia or aspiration. Chest x-ray follow-up in 6-12 weeks is suggested. 3. Hiatal hernia.   Electronically Signed   By: Monte Fantasia M.D.   On: 09/11/2014 04:02   Dg Abd Acute W/chest 09/11/2014  No acute findings at the chest or abdomen.   Electronically Signed   By: Monte Fantasia M.D.   On: 09/11/2014 02:35   Lower extremity doppler 09/11/2014 - negative for DVT  Medical Consultants:  None   Other Consultants:  Nutrition PT WOC  IAnti-Infectives:   Azithromycin 09/11/2014 --> Rocephin 09/11/2014 -->   DEVINE, ALMA, MD  [facility] Pager [phone]  Time spent in minutes: 25 minutes  If 7PM-7AM, please contact night-coverage [email or web address] Password [email or web address] 09/12/2014, 10:03 AM   LOS: 1 day    HPI/Subjective: No acute overnight events. Patient reports feeling better this am.   Objective: Filed Vitals:   09/11/14 1444 09/11/14 2106 09/12/14 0438 09/12/14 0921  BP: 151/59 139/42 160/46 146/37  Pulse: 51 62 50 53  Temp: 97.6 F (36.4 C) 98.2 F (36.8 C) 97.6 F (36.4 C)   TempSrc: Oral Oral Oral   Resp: 16 16 16    Height:      Weight:      SpO2: 95%  98%      Intake/Output Summary (Last 24 hours) at 09/12/14 1003 Last data filed at 09/12/14 0827  Gross per 24 hour  Intake  973.7 ml  Output    400 ml  Net  573.7 ml    Exam:   General:  Pt is alert, not in acute distress  Cardiovascular: Bradycardic, S1/S2, no murmurs  Respiratory: Clear to auscultation bilaterally, no wheezing, no crackles, no rhonchi  Abdomen: Soft, non tender, non distended, bowel sounds present  Extremities: No edema, pulses DP and PT palpable bilaterally  Neuro: Grossly nonfocal  Data Reviewed: Basic Metabolic Panel:  Recent Labs Lab 09/11/14 0115  NA 137  K 4.1  CL 99*  CO2 28  GLUCOSE 124*  BUN 22*  CREATININE 1.19*  CALCIUM 9.2   Liver Function Tests:  Recent Labs Lab 09/11/14 0115  AST 31  ALT 20  ALKPHOS 59  BILITOT 1.0  PROT 7.5  ALBUMIN 4.2    Recent Labs Lab 09/11/14 0115  LIPASE 29   No results for input(s): AMMONIA in the last 168 hours. CBC:  Recent Labs Lab 09/11/14 0115 09/12/14 0516  WBC 6.1 5.6  HGB 13.4 11.7*  HCT 41.0 36.4  MCV 102.5* 102.2*  PLT 208 174   Cardiac Enzymes:  Recent Labs Lab 09/11/14 0831 09/11/14 1306 09/11/14 2047  TROPONINI <0.03 <0.03 <0.03   BNP: Invalid input(s): POCBNP CBG: No results for input(s): GLUCAP in the last 168 hours.  Recent Results (from the past 240 hour(s))  MRSA PCR Screening     Status: None   Collection Time: 09/11/14  4:47 AM  Result Value Ref Range Status   MRSA by PCR NEGATIVE NEGATIVE Final    Comment:        The GeneXpert MRSA Assay (FDA approved for NASAL specimens only), is one component of a comprehensive MRSA colonization surveillance program. It is not intended to diagnose MRSA infection nor to guide or monitor treatment for MRSA infections.      Scheduled Meds: . amiodarone  200 mg Oral q morning - 10a  . atorvastatin  40 mg Oral QPM  . azithromycin  500 mg Intravenous Q24H  . cefTRIAXone (ROCEPHIN)  IV  1 g Intravenous Q24H   . furosemide  40 mg Oral Daily  . letrozole  2.5 mg Oral Daily  . metoprolol  100 mg Oral BID  . polyethylene glycol  17 g Oral q morning - 10a   Continuous Infusions: . heparin 600 Units/hr (09/11/14 0930)

## 2014-09-13 ENCOUNTER — Inpatient Hospital Stay (HOSPITAL_COMMUNITY): Payer: Medicare Other

## 2014-09-13 DIAGNOSIS — L8990 Pressure ulcer of unspecified site, unspecified stage: Secondary | ICD-10-CM

## 2014-09-13 LAB — CBC
HEMATOCRIT: 34.8 % — AB (ref 36.0–46.0)
Hemoglobin: 11.5 g/dL — ABNORMAL LOW (ref 12.0–15.0)
MCH: 34.2 pg — ABNORMAL HIGH (ref 26.0–34.0)
MCHC: 33 g/dL (ref 30.0–36.0)
MCV: 103.6 fL — ABNORMAL HIGH (ref 78.0–100.0)
PLATELETS: 172 10*3/uL (ref 150–400)
RBC: 3.36 MIL/uL — AB (ref 3.87–5.11)
RDW: 14.5 % (ref 11.5–15.5)
WBC: 5.9 10*3/uL (ref 4.0–10.5)

## 2014-09-13 LAB — LEGIONELLA ANTIGEN, URINE

## 2014-09-13 LAB — CREATININE, SERUM
Creatinine, Ser: 1.11 mg/dL — ABNORMAL HIGH (ref 0.44–1.00)
GFR, EST AFRICAN AMERICAN: 50 mL/min — AB (ref >60)
GFR, EST NON AFRICAN AMERICAN: 43 mL/min — AB (ref >60)

## 2014-09-13 MED ORDER — AZITHROMYCIN 250 MG PO TABS
500.0000 mg | ORAL_TABLET | Freq: Every day | ORAL | Status: DC
  Administered 2014-09-14 – 2014-09-15 (×2): 500 mg via ORAL
  Filled 2014-09-13 (×2): qty 2

## 2014-09-13 MED ORDER — APIXABAN 2.5 MG PO TABS
2.5000 mg | ORAL_TABLET | Freq: Two times a day (BID) | ORAL | Status: DC
  Administered 2014-09-13 – 2014-09-15 (×4): 2.5 mg via ORAL
  Filled 2014-09-13 (×4): qty 1

## 2014-09-13 NOTE — Progress Notes (Addendum)
Patient ID: Holly Weaver, female   DOB: [birth year], 81 y.o.   MRN: [id number] [facility] PROGRESS NOTE  Holly Weaver [id number] DOB: [birth year] DOA: 09/11/2014 PCP: [name], MD  Brief narrative:    81 y.o. female with past medical history of breast cancer, hypertension, atrial fibrillation on anticoagulation with apixaban who presented to [facility] long hospital with reports of left-sided chest pain started on the day prior to the admission. Patient also reported associated shortness of breath and cough without fever. On admission, CT angiogram of the chest revealed thrombosis of her right middle lobe subsegmental pulmonary artery of uncertain chronicity. There is a consultation in the right more than left lower lobe favored chronic given bronchiectasis however cannot exclude superimposed acute pneumonia or aspiration. Chest x-ray in 6-12 weeks is suggested to follow up on resolution of those findings. Lower extremity Doppler did not reveal DVT. Patient was started on empiric azithromycin and Rocephin for treatment of pneumonia.  Anticipated discharge: If she feels better likely discharge by 09/15/2014.   Assessment/Plan:    Principal Problem: Acute respiratory failure with hypoxia / lobar pneumonia, unspecified organism - Patient saturating 98% with nasal cannula oxygen support. Slight hypoxia on admission likely secondary to possible pneumonia versus chronic bronchiectasis - Continue azithromycin and Rocephin for treatment of pneumonia - CXR done today showed persistent right side infiltrate - Stable respiratory status  Active Problems:   PAF (paroxysmal atrial fibrillation), maintaining SR / Long term current use of anticoagulant therapy - CHADS vasc score at least 3 - Continue apixaban  2.5 mg BID - Continue amiodarone - Metoprolol held due to bradycardia       Hypertension, essential  - Continue Lasix.  - Hold metoprolol because of bradycardia    Breast  cancer, left breast - Continue letrozole    Dyslipidemia - Continue Lipitor 40 mg daily    Pressure ulcer, coccyx, stage 1  - Appreciate WOC assessment     Moderate protein calorie malnutrition - Continue nutritional supplementation - Nutrition consulted    DVT Prophylaxis  - On AC with apixaban   Code Status: Full.  Family Communication:  plan of care discussed with the patient's daughter at the bedside 10/01/22. Disposition Plan: PT eval pending, d/c by 09/15/2014.  IV access:  Peripheral IV  Procedures and diagnostic studies:    Dg Chest Port 1 View 01-Oct-2014   No change from the prior exam. Persistent right medial lung base infiltrate is seen.    Ct Angio Chest Pe W/cm &/or Wo Cm 09/11/2014  1. Thrombosis of a right middle lobe subsegmental pulmonary artery, chronicity uncertain. Consider lower extremity Dopplers. 2. Consolidation in the right more than left lower lobes is favored chronic given bronchiectasis, bronchial stenoses/occlusions and poor blood flow. Cannot exclude superimposed acute pneumonia or aspiration. Chest x-ray follow-up in 6-12 weeks is suggested. 3. Hiatal hernia.   Electronically Signed   By: Monte Fantasia M.D.   On: 09/11/2014 04:02   Dg Abd Acute W/chest 09/11/2014  No acute findings at the chest or abdomen.   Electronically Signed   By: Monte Fantasia M.D.   On: 09/11/2014 02:35   Lower extremity doppler 09/11/2014 - negative for DVT  Medical Consultants:  None   Other Consultants:  Nutrition PT WOC  IAnti-Infectives:   Azithromycin 09/11/2014 --> Rocephin 09/11/2014 -->   Holly Gfeller, MD  [facility] Pager [phone]  Time spent in minutes: 25 minutes  If 7PM-7AM, please contact night-coverage [email or web address] Password [email or web address] 2014/10/01, 3:10  PM   LOS: 2 days    HPI/Subjective: No acute overnight events. Patient reports feeling tired today.   Objective: Filed Vitals:   09/12/14 2108 09/13/14 0520 09/13/14 0958 09/13/14 1313   BP: 88/57 169/59 120/40 113/50  Pulse: 62 59 61 68  Temp: 98.1 F (36.7 C) 97.8 F (36.6 C)  98.6 F (37 C)  TempSrc: Oral Oral  Oral  Resp: 16 16  20   Height:      Weight:      SpO2: 97% 99%  98%    Intake/Output Summary (Last 24 hours) at 09/13/14 1510 Last data filed at 09/13/14 1313  Gross per 24 hour  Intake    480 ml  Output   1000 ml  Net   -520 ml    Exam:   General:  Pt is alert, not in acute distress  Cardiovascular: rate controlled, (+) S1, S2   Respiratory: No wheezing, no crackles, no rhonchi  Abdomen: non tender, non distended, bowel sounds appreciated   Extremities: No leg swelling, pulses palpable   Neuro: Nonfocal  Data Reviewed: Basic Metabolic Panel:  Recent Labs Lab 09/11/14 0115 09/12/14 0516 09/13/14 0435  NA 137 138  --   K 4.1 3.6  --   CL 99* 100*  --   CO2 28 30  --   GLUCOSE 124* 97  --   BUN 22* 16  --   CREATININE 1.19* 1.12* 1.11*  CALCIUM 9.2 8.3*  --    Liver Function Tests:  Recent Labs Lab 09/11/14 0115  AST 31  ALT 20  ALKPHOS 59  BILITOT 1.0  PROT 7.5  ALBUMIN 4.2    Recent Labs Lab 09/11/14 0115  LIPASE 29   No results for input(s): AMMONIA in the last 168 hours. CBC:  Recent Labs Lab 09/11/14 0115 09/12/14 0516 09/13/14 0435  WBC 6.1 5.6 5.9  HGB 13.4 11.7* 11.5*  HCT 41.0 36.4 34.8*  MCV 102.5* 102.2* 103.6*  PLT 208 174 172   Cardiac Enzymes:  Recent Labs Lab 09/11/14 0831 09/11/14 1306 09/11/14 2047  TROPONINI <0.03 <0.03 <0.03   BNP: Invalid input(s): POCBNP CBG: No results for input(s): GLUCAP in the last 168 hours.  Recent Results (from the past 240 hour(s))  Blood culture (routine x 2)     Status: None (Preliminary result)   Collection Time: 09/11/14  4:45 AM  Result Value Ref Range Status   Specimen Description BLOOD BLOOD LEFT FOREARM  Final   Special Requests BOTTLES DRAWN AEROBIC AND ANAEROBIC 4ML  Final   Culture   Final    NO GROWTH 2 DAYS Performed at [facility]    Report Status PENDING  Incomplete  MRSA PCR Screening     Status: None   Collection Time: 09/11/14  4:47 AM  Result Value Ref Range Status   MRSA by PCR NEGATIVE NEGATIVE Final    Comment:        The GeneXpert MRSA Assay (FDA approved for NASAL specimens only), is one component of a comprehensive MRSA colonization surveillance program. It is not intended to diagnose MRSA infection nor to guide or monitor treatment for MRSA infections.   Blood culture (routine x 2)     Status: None (Preliminary result)   Collection Time: 09/11/14  4:57 AM  Result Value Ref Range Status   Specimen Description BLOOD RIGHT ANTECUBITAL  Final   Special Requests BOTTLES DRAWN AEROBIC AND ANAEROBIC 5ML  Final   Culture   Final  NO GROWTH 2 DAYS Performed at [facility]    Report Status PENDING  Incomplete     Scheduled Meds: . amiodarone  200 mg Oral q morning - 10a  . apixaban  2.5 mg Oral BID  . atorvastatin  40 mg Oral QPM  . [START ON 09/14/2014] azithromycin  500 mg Oral Daily  . cefTRIAXone (ROCEPHIN)  IV  1 g Intravenous Q24H  . feeding supplement (ENSURE ENLIVE)  237 mL Oral BID BM  . furosemide  40 mg Oral Daily  . letrozole  2.5 mg Oral Daily  . polyethylene glycol  17 g Oral q morning - 10a   Continuous Infusions:

## 2014-09-13 NOTE — Progress Notes (Signed)
PT Cancellation Note  Patient Details Name: Holly Weaver MRN: [id number] DOB: 08/18/1923   Cancelled Treatment:    Reason Eval/Treat Not Completed: Other (comment) Pt up in recliner and reports nausea.  RN notified.   Bobby Barton,KATHrine E 09/13/2014, 9:41 AM Carmelia Bake, PT, DPT 09/13/2014 Pager: [phone]

## 2014-09-13 NOTE — Evaluation (Signed)
Physical Therapy Evaluation Patient Details Name: Holly Weaver MRN: [id number] DOB: 1923-06-11 Today's Date: 09/13/2014   History of Present Illness  81 y.o. female with past medical history of breast cancer, hypertension, atrial fibrillation on anticoagulation with apixaban who presented to [facility] hospital with reports of left-sided chest pain started on the day prior to the admission and admitted for acute respiratory failure with hypoxia / lobar pneumonia, unspecified organism   Clinical Impression  Pt admitted with above diagnosis. Pt currently with functional limitations due to the deficits listed below (see PT Problem List).  Pt will benefit from skilled PT to increase their independence and safety with mobility to allow discharge to the venue listed below.   Pt and daughter report pt mobility close to baseline.  Pt reports daughter can stay with her upon d/c and takes very good care of her.  SPo2 room air at rest 100%.  SpO2 86% upon return to room after ambulation however quickly improved to 95% room air.  RN notified.     Follow Up Recommendations Home health PT    Equipment Recommendations  None recommended by PT    Recommendations for Other Services       Precautions / Restrictions Precautions Precautions: Fall Precaution Comments: monitor sats      Mobility  Bed Mobility Overal bed mobility: Needs Assistance Bed Mobility: Supine to Sit;Sit to Supine     Supine to sit: Supervision;HOB elevated Sit to supine: Supervision;HOB elevated      Transfers Overall transfer level: Needs assistance Equipment used: Rolling walker (2 wheeled) Transfers: Sit to/from Stand Sit to Stand: Min assist         General transfer comment: verbal cues for safety, typically uses RW, assist for low toilet surface  Ambulation/Gait Ambulation/Gait assistance: Min guard Ambulation Distance (Feet): 120 Feet Assistive device: Rolling walker (2 wheeled) Gait  Pattern/deviations: Step-through pattern;Decreased stride length     General Gait Details: slow pace but steady with RW, SpO2 94% room air during ambulation however reading 86% but quickly improved to 95% upon returning to room (?pulse oximeter error, also pt with cold hands)  Stairs            Wheelchair Mobility    Modified Rankin (Stroke Patients Only)       Balance                                             Pertinent Vitals/Pain Pain Assessment: No/denies pain    Home Living Family/patient expects to be discharged to:: Private residence Living Arrangements: Alone Available Help at Discharge: Family (daughter) Type of Home: House Home Access: Stairs to enter   Technical brewer of Steps: 2 Home Layout: One level Home Equipment: Environmental consultant - 4 wheels      Prior Function Level of Independence: Independent with assistive device(s)               Hand Dominance        Extremity/Trunk Assessment               Lower Extremity Assessment: Generalized weakness      Cervical / Trunk Assessment: Other exceptions  Communication   Communication: No difficulties  Cognition Arousal/Alertness: Awake/alert Behavior During Therapy: WFL for tasks assessed/performed Overall Cognitive Status: Within Functional Limits for tasks assessed  General Comments      Exercises        Assessment/Plan    PT Assessment Patient needs continued PT services  PT Diagnosis Difficulty walking   PT Problem List Decreased strength;Decreased activity tolerance;Decreased mobility  PT Treatment Interventions DME instruction;Gait training;Functional mobility training;Therapeutic activities;Patient/family education;Therapeutic exercise   PT Goals (Current goals can be found in the Care Plan section) Acute Rehab PT Goals PT Goal Formulation: With patient Time For Goal Achievement: 09/20/14 Potential to Achieve Goals:  Good    Frequency Min 3X/week   Barriers to discharge        Co-evaluation               End of Session   Activity Tolerance: Patient tolerated treatment well Patient left: in bed;with call bell/phone within reach;with family/visitor present           Time: [id number] PT Time Calculation (min) (ACUTE ONLY): 17 min   Charges:   PT Evaluation $Initial PT Evaluation Tier I: 1 Procedure     PT G Codes:        Blain Hunsucker,KATHrine E 09/13/2014, 3:59 PM Carmelia Bake, PT, DPT 09/13/2014 Pager: [phone]

## 2014-09-13 NOTE — Progress Notes (Signed)
PHARMACIST - PHYSICIAN COMMUNICATION DR:   Charlies Silvers CONCERNING: Antibiotic IV to Oral Route Change Policy  RECOMMENDATION: This patient is receiving azithromycin by the intravenous route.  Based on criteria approved by the Pharmacy and Therapeutics Committee, the antibiotic(s) is/are being converted to the equivalent oral dose form(s).   DESCRIPTION: These criteria include:  Patient being treated for a respiratory tract infection, urinary tract infection, cellulitis or clostridium difficile associated diarrhea if on metronidazole  The patient is not neutropenic and does not exhibit a GI malabsorption state  The patient is eating (either orally or via tube) and/or has been taking other orally administered medications for a least 24 hours  The patient is improving clinically and has a Tmax < 100.5  If you have questions about this conversion, please contact the Pharmacy Department  []   [phone] )  [facility] []   [phone] )  [facility] []   [phone] )  [facility] []   [phone] )  [facility] [x]   [phone] )  [place], PharmD, BCPS 09/13/2014 9:03 AM

## 2014-09-14 DIAGNOSIS — E43 Unspecified severe protein-calorie malnutrition: Secondary | ICD-10-CM | POA: Diagnosis present

## 2014-09-14 DIAGNOSIS — J181 Lobar pneumonia, unspecified organism: Principal | ICD-10-CM | POA: Diagnosis present

## 2014-09-14 DIAGNOSIS — J9601 Acute respiratory failure with hypoxia: Secondary | ICD-10-CM | POA: Diagnosis present

## 2014-09-14 LAB — CBC
HCT: 35.7 % — ABNORMAL LOW (ref 36.0–46.0)
HEMOGLOBIN: 11.7 g/dL — AB (ref 12.0–15.0)
MCH: 34.1 pg — ABNORMAL HIGH (ref 26.0–34.0)
MCHC: 32.8 g/dL (ref 30.0–36.0)
MCV: 104.1 fL — ABNORMAL HIGH (ref 78.0–100.0)
Platelets: 177 10*3/uL (ref 150–400)
RBC: 3.43 MIL/uL — ABNORMAL LOW (ref 3.87–5.11)
RDW: 14.7 % (ref 11.5–15.5)
WBC: 6.6 10*3/uL (ref 4.0–10.5)

## 2014-09-14 NOTE — Care Management Important Message (Signed)
Important Message  Patient Details  Name: AMERIE MIKRUT MRN: [id number] Date of Birth: 02-24-1923   Medicare Important Message Given:  [facility] notification given    Camillo Flaming 09/14/2014, 12:08 [place] Message  Patient Details  Name: JACQUI IRIAS MRN: [id number] Date of Birth: [birth year]   Medicare Important Message Given:  Yes-second notification given    Camillo Flaming 09/14/2014, 12:08 PM

## 2014-09-14 NOTE — Care Management Note (Signed)
Case Management Note  Patient Details  Name: Holly Weaver MRN: [id number] Date of Birth: Mar 19, 1923  Subjective/Objective:                   left-sided chest pain Action/Plan:  Discharge planning Expected Discharge Date:  09/15/14               Expected Discharge Plan:  [place]  In-House Referral:     Discharge planning Services  CM Consult  Post Acute Care Choice:  Home Health Choice offered to:  Patient  DME Arranged:    DME Agency:     HH Arranged:    [place] Agency:  [place]  Status of Service:  Completed, signed off  Medicare Important Message Given:  Yes-second notification given Date Medicare IM Given:    Medicare IM give by:    Date Additional Medicare IM Given:    Additional Medicare Important Message give by:     If discussed at [place] of Stay Meetings, dates discussed:    Additional Comments: CM met with pt in room to offer chice of home health agency.  Pt states she has no preference and anything I arrange will be fine.  Address and contact information verified with pt.  Pt states she has a rolling walker at home and does not need any other DME.  Pt states her daughter can stay with her for the first few days.  Referral called to [facility]  Rep, [place].  No other CM needs were communicated. Dellie Catholic, RN 09/14/2014, 12:14 PM

## 2014-09-14 NOTE — Progress Notes (Addendum)
Patient ID: Holly Weaver, female   DOB: [birth year], 81 y.o.   MRN: [id number] [facility] PROGRESS NOTE  Holly Weaver [id number] DOB: [birth year] DOA: 09/11/2014 PCP: [name], MD  Brief narrative:    81 y.o. female with past medical history of breast cancer, hypertension, atrial fibrillation on anticoagulation with apixaban who presented to [facility] with reports of left-sided chest pain started on the day prior to the admission. Patient also reported associated shortness of breath and cough without fever. On admission, CT angiogram of the chest revealed thrombosis of her right middle lobe subsegmental pulmonary artery of uncertain chronicity. There is a consultation in the right more than left lower lobe favored chronic given bronchiectasis however cannot exclude superimposed acute pneumonia or aspiration. Chest x-ray in 6-12 weeks is suggested to follow up on resolution of those findings. Lower extremity Doppler did not reveal DVT. Patient was started on empiric azithromycin and Rocephin for treatment of pneumonia.  Anticipated discharge: If she feels better likely discharge by 09/15/2014.   Assessment/Plan:    Principal Problem: Acute respiratory failure with hypoxia / lobar pneumonia, unspecified organism - Stable respiratory status. Slight hypoxia on the admission likely secondary to pneumonia or chronic bronchiectasis - Patient treated empirically with azithromycin and Rocephin - Chest x-ray 2014/10/07 shows persistent right side infiltrate. We will continue current anti-biotics.  Active Problems:   PAF (paroxysmal atrial fibrillation), maintaining SR / Long term current use of anticoagulant therapy - CHADS vasc score at least 3 - Continue apixaban  2.5 mg BID - Continue amiodarone - Metoprolol held because of episode of bradycardia while taking metoprolol      Hypertension, essential  - Continue Lasix.  - As mentioned, metoprolol on hold  because of episodes of bradycardia    Breast cancer, left breast - Continue letrozole 2.5 mg daily    Dyslipidemia - Continue Lipitor 40 mg daily    Pressure ulcer, coccyx, stage 1  - Appreciate WOC assessment     Severe protein calorie malnutrition / Failure to thrive in adult  - Continue nutritional supplementation - Nutrition consulted     CAD in native artery, non obstructive by cath 2000 - On anticoagulation with apixaban  - No reports of chest pain     DVT Prophylaxis  - On AC with apixaban   Code Status: Full.  Family Communication:  plan of care discussed with the patient's daughter at the bedside 8/23 Disposition Plan: Home likely by 09/15/2014 if nausea improves and if patient feels better.  IV access:  Peripheral IV  Procedures and diagnostic studies:    Dg Chest Port 1 View October 07, 2014   No change from the prior exam. Persistent right medial lung base infiltrate is seen.    Ct Angio Chest Pe W/cm &/or Wo Cm 09/11/2014  1. Thrombosis of a right middle lobe subsegmental pulmonary artery, chronicity uncertain. Consider lower extremity Dopplers. 2. Consolidation in the right more than left lower lobes is favored chronic given bronchiectasis, bronchial stenoses/occlusions and poor blood flow. Cannot exclude superimposed acute pneumonia or aspiration. Chest x-ray follow-up in 6-12 weeks is suggested. 3. Hiatal hernia.   Electronically Signed   By: Monte Fantasia M.D.   On: 09/11/2014 04:02   Dg Abd Acute W/chest 09/11/2014  No acute findings at the chest or abdomen.   Electronically Signed   By: Monte Fantasia M.D.   On: 09/11/2014 02:35   Lower extremity doppler 09/11/2014 - negative for DVT  Medical Consultants:  None  Other Consultants:  Nutrition PT WOC  IAnti-Infectives:   Azithromycin 09/11/2014 --> Rocephin 09/11/2014 -->   Kendrah Lovern, MD  [facility] Pager [phone]  Time spent in minutes: 15 minutes  If 7PM-7AM, please contact  night-coverage [email or web address] Password [email or web address] 09/14/2014, 12:20 PM   LOS: 3 days    HPI/Subjective: No acute overnight events. Patient reports feeling nauseous today.  Objective: Filed Vitals:   09/13/14 0958 09/13/14 1313 09/13/14 2157 09/14/14 0645  BP: 120/40 113/50 135/91 132/90  Pulse: 61 68 65 67  Temp:  98.6 F (37 C) 98.1 F (36.7 C) 98.2 F (36.8 C)  TempSrc:  Oral Oral Oral  Resp:  20 20 20   Height:      Weight:      SpO2:  98% 98% 98%    Intake/Output Summary (Last 24 hours) at 09/14/14 1220 Last data filed at 09/13/14 1800  Gross per 24 hour  Intake    240 ml  Output    300 ml  Net    -60 ml    Exam:   General:  Pt is alert, sitting in chair in no distress  Cardiovascular: Regular rhythm, appreciate S1-S2  Respiratory: Bilateral air entry, no wheezing  Abdomen: Appreciate bowel sounds, nontender abdomen, non-distended  Extremities: No lower extremity edema, pulses palpable bilaterally  Neuro: No focal neurological deficits  Data Reviewed: Basic Metabolic Panel:  Recent Labs Lab 09/11/14 0115 09/12/14 0516 09/13/14 0435  NA 137 138  --   K 4.1 3.6  --   CL 99* 100*  --   CO2 28 30  --   GLUCOSE 124* 97  --   BUN 22* 16  --   CREATININE 1.19* 1.12* 1.11*  CALCIUM 9.2 8.3*  --    Liver Function Tests:  Recent Labs Lab 09/11/14 0115  AST 31  ALT 20  ALKPHOS 59  BILITOT 1.0  PROT 7.5  ALBUMIN 4.2    Recent Labs Lab 09/11/14 0115  LIPASE 29   No results for input(s): AMMONIA in the last 168 hours. CBC:  Recent Labs Lab 09/11/14 0115 09/12/14 0516 09/13/14 0435 09/14/14 0452  WBC 6.1 5.6 5.9 6.6  HGB 13.4 11.7* 11.5* 11.7*  HCT 41.0 36.4 34.8* 35.7*  MCV 102.5* 102.2* 103.6* 104.1*  PLT 208 174 172 177   Cardiac Enzymes:  Recent Labs Lab 09/11/14 0831 09/11/14 1306 09/11/14 2047  TROPONINI <0.03 <0.03 <0.03   BNP: Invalid input(s): POCBNP CBG: No results for input(s): GLUCAP in the last 168  hours.  Recent Results (from the past 240 hour(s))  Blood culture (routine x 2)     Status: None (Preliminary result)   Collection Time: 09/11/14  4:45 AM  Result Value Ref Range Status   Specimen Description BLOOD BLOOD LEFT FOREARM  Final   Special Requests BOTTLES DRAWN AEROBIC AND ANAEROBIC 4ML  Final   Culture   Final    NO GROWTH 2 DAYS Performed at [facility]    Report Status PENDING  Incomplete  MRSA PCR Screening     Status: None   Collection Time: 09/11/14  4:47 AM  Result Value Ref Range Status   MRSA by PCR NEGATIVE NEGATIVE Final    Comment:        The GeneXpert MRSA Assay (FDA approved for NASAL specimens only), is one component of a comprehensive MRSA colonization surveillance program. It is not intended to diagnose MRSA infection nor to guide or monitor treatment for MRSA infections.   Blood  culture (routine x 2)     Status: None (Preliminary result)   Collection Time: 09/11/14  4:57 AM  Result Value Ref Range Status   Specimen Description BLOOD RIGHT ANTECUBITAL  Final   Special Requests BOTTLES DRAWN AEROBIC AND ANAEROBIC 5ML  Final   Culture   Final    NO GROWTH 2 DAYS Performed at [facility]    Report Status PENDING  Incomplete     . amiodarone  200 mg Oral q morning - 10a  . apixaban  2.5 mg Oral BID  . atorvastatin  40 mg Oral QPM  . azithromycin  500 mg Oral Daily  . cefTRIAXone (ROCEPHIN)  IV  1 g Intravenous Q24H  . feeding supplement (ENSURE ENLIVE)  237 mL Oral BID BM  . furosemide  40 mg Oral Daily  . letrozole  2.5 mg Oral Daily  . polyethylene glycol  17 g Oral q morning - 10a     Continuous Infusions:

## 2014-09-15 DIAGNOSIS — J9601 Acute respiratory failure with hypoxia: Secondary | ICD-10-CM

## 2014-09-15 DIAGNOSIS — J189 Pneumonia, unspecified organism: Secondary | ICD-10-CM

## 2014-09-15 DIAGNOSIS — I10 Essential (primary) hypertension: Secondary | ICD-10-CM

## 2014-09-15 DIAGNOSIS — E43 Unspecified severe protein-calorie malnutrition: Secondary | ICD-10-CM

## 2014-09-15 LAB — CBC
HCT: 35.3 % — ABNORMAL LOW (ref 36.0–46.0)
Hemoglobin: 11.4 g/dL — ABNORMAL LOW (ref 12.0–15.0)
MCH: 33.1 pg (ref 26.0–34.0)
MCHC: 32.3 g/dL (ref 30.0–36.0)
MCV: 102.6 fL — AB (ref 78.0–100.0)
PLATELETS: 161 10*3/uL (ref 150–400)
RBC: 3.44 MIL/uL — AB (ref 3.87–5.11)
RDW: 14.7 % (ref 11.5–15.5)
WBC: 6 10*3/uL (ref 4.0–10.5)

## 2014-09-15 MED ORDER — ENSURE ENLIVE PO LIQD: 237.0000 mL | Freq: Two times a day (BID) | ORAL | Status: AC

## 2014-09-15 MED ORDER — AMOXICILLIN-POT CLAVULANATE 500-125 MG PO TABS: 1.0000 | ORAL_TABLET | Freq: Two times a day (BID) | ORAL | Status: DC

## 2014-09-15 MED ORDER — PROMETHAZINE HCL 12.5 MG PO TABS
12.5000 mg | ORAL_TABLET | Freq: Three times a day (TID) | ORAL | Status: DC | PRN
Start: 2014-09-15 — End: 2016-01-28

## 2014-09-15 NOTE — Discharge Summary (Signed)
Physician Discharge Summary  Holly Weaver [id number] DOB: Jun 08, 1923 DOA: 09/11/2014  PCP: [name], MD  Admit date: 09/11/2014 Discharge date: 09/15/2014  Time spent: Greater than 30 minutes  Recommendations for Outpatient Follow-up:  1. Dr. [name], PCP in 5 days. Please follow final blood culture results that were sent from hospital. 2. [facility], OT & Aide 3. Recommend follow-up chest x-ray in 4-6 weeks to ensure resolution of pneumonia findings.  Discharge Diagnoses:  Principal Problem:   Lobar pneumonia due to unspecified organism Active Problems:   PAF (paroxysmal atrial fibrillation), maintaining SR   Long term current use of anticoagulant therapy   Dyslipidemia   Hypertension   CAD in native artery, non obstructive by cath 2000   Breast cancer, left breast   Pressure ulcer   Acute respiratory failure with hypoxia   Protein-calorie malnutrition, severe   Discharge Condition: Improved & Stable  Diet recommendation: Heart healthy diet.  Filed Weights   09/11/14 0051 09/11/14 0644  Weight: 48.535 kg (107 lb) 47.2 kg (104 lb 0.9 oz)    History of present illness:  81 year old female patient with history of breast cancer, HTN, A. fib on Apixaban was admitted to the [facility] on 09/11/14 with complaints of left sided chest pain, dyspnea and cough without fever. CT chest showed thrombosis of the right middle lobe subsegmental pulmonary artery, chronicity uncertain and consolidation in the right more than left lower lobes favoring chronic bronchiectasis, bronchial stenosis/occlusions and poor blood flow but cannot exclude superimposed acute pneumonia or aspiration. She was hospitalized for acute respiratory failure related to pneumonia.  Hospital Course:   Community-acquired pneumonia complicating bronchiectasis - Patient was treated empirically with IV Rocephin and azithromycin of which she has completed approximately 5-6 days. Clinically  improved. No fever or leukocytosis. Symptomatically better. Blood cultures 2: Negative to date. Will transition to oral Augmentin and complete total 7 days treatment. Recommend repeating chest x-ray in 4-6 weeks to ensure resolution of pneumonia findings. May consider outpatient pulmonary consultation for further evaluation of bronchiectasis.  - Urine Legionella and pneumococcal antigen negative. - Mild intermittent nausea. Patient and daughter are requesting for some antinausea medication as needed. Etiology of this is unclear-? Related to pneumonia versus antibiotics versus other etiology. Monitor with treatment and close outpatient follow-up.  Acute respiratory failure with hypoxia - Secondary to pneumonia complicating bronchiectasis. Resolved. Oxygen saturations were in the low 90s/91% on room air with activity this morning. Resolved protrusion  Paroxysmal atrial fibrillation - She had persistent bradycardia in the mid 40s a couple days back both during the day and nighttime. Metoprolol has been held since admission. Bradycardia has resolved. She remains in sinus rhythm. Continue amiodarone and Eliquis. Close outpatient follow-up with PCP in the next few days at which time determination can be made whether to reinitiate metoprolol at low dose.   Essential hypertension - Mildly uncontrolled. Continue ARB.  History of breast cancer - Continue Femara  Osteoporosis/compression fractures - Continue Prolia, vitamin D and calcium supplements  Hyperlipidemia - Continue statins  Stage I pressure injury to coccyx - Management as per wound care team.  Severe malnutrition in the context of chronic illness, underweight - Nutritional supplements as per dietitian evaluation.  Right mid lobe subsegmental pulmonary artery thrombosis - Unclear chronicity. Continue anticoagulation as above.  Chronic anemia - Outpatient follow-up and evaluation as deemed necessary.   Consultations:    None  Procedures:   Bilateral lower extremity venous Dopplers 09/11/14: Summary: No evidence of  deep vein thrombosis involving the right lower extremity and left lower extremity.   Discharge Exam:  Complaints:  States that she feels much better. Appetite improved. Mild nausea without vomiting. Denies pain or dyspnea. Mild intermittent cough with tan sputum but improving since admission. States that she will go home and has no inclination of going to SNF for rehabilitation.  Filed Vitals:   09/14/14 1419 09/14/14 2212 09/15/14 0530 09/15/14 0920  BP: 145/63 145/71 144/60   Pulse: 67 67 65   Temp: 98.5 F (36.9 C) 98.3 F (36.8 C) 98.2 F (36.8 C)   TempSrc: Oral Oral Oral   Resp: 19 20 20    Height:      Weight:      SpO2: 95% 95% 98% 94%    General exam:  Pleasant but frail elderly female sitting up comfortably at edge of bed this morning. Respiratory system: scattered occasional basal crackles but otherwise clear to auscultation. No increased work of breathing. Cardiovascular system: S1 & S2 heard, RRR. No JVD, murmurs, gallops, clicks or pedal edema. Telemetry: Sinus rhythm. Gastrointestinal system: Abdomen is nondistended, soft and nontender. Normal bowel sounds heard. Central nervous system: Alert and oriented. No focal neurological deficits. Extremities: Symmetric 5 x 5 power.  Discharge Instructions      Discharge Instructions    (HEART FAILURE PATIENTS) Call MD:  Anytime you have any of the following symptoms: 1) 3 pound weight gain in 24 hours or 5 pounds in 1 week 2) shortness of breath, with or without a dry hacking cough 3) swelling in the hands, feet or stomach 4) if you have to sleep on extra pillows at night in order to breathe.    Complete by:  As directed      Call MD for:  difficulty breathing, headache or visual disturbances    Complete by:  As directed      Call MD for:  extreme fatigue    Complete by:  As directed      Call MD for:  persistant  dizziness or light-headedness    Complete by:  As directed      Call MD for:  persistant nausea and vomiting    Complete by:  As directed      Call MD for:  severe uncontrolled pain    Complete by:  As directed      Call MD for:  temperature >100.4    Complete by:  As directed      Diet - low sodium heart healthy    Complete by:  As directed      Increase activity slowly    Complete by:  As directed             Medication List    STOP taking these medications        metoprolol 100 MG tablet  Commonly known as:  LOPRESSOR      TAKE these medications        ALPRAZolam 0.25 MG tablet  Commonly known as:  XANAX  Take one tablet by mouth every 12 hours as needed for severe anxiety     amiodarone 200 MG tablet  Commonly known as:  PACERONE  Take 200 mg by mouth every morning.     amoxicillin-clavulanate 500-125 MG per tablet  Commonly known as:  AUGMENTIN  Take 1 tablet (500 mg total) by mouth 2 (two) times daily.     atorvastatin 40 MG tablet  Commonly known as:  LIPITOR  Take 40 mg  by mouth every evening.     calcium-vitamin D 500-200 MG-UNIT per tablet  Commonly known as:  OSCAL WITH D  Take 1 tablet by mouth every morning.     denosumab 60 MG/ML Soln injection  Commonly known as:  PROLIA  Inject 60 mg into the skin every 6 (six) months. Administer in upper arm, thigh, or abdomen     ELIQUIS 2.5 MG Tabs tablet  Generic drug:  apixaban  Take 2.5 mg by mouth 2 (two) times daily.     feeding supplement (ENSURE ENLIVE) Liqd  Take 237 mLs by mouth 2 (two) times daily between meals.     furosemide 40 MG tablet  Commonly known as:  LASIX  Take 1 tablet (40 mg total) by mouth daily.     HYDROcodone-acetaminophen 7.5-325 MG per tablet  Commonly known as:  NORCO  Take 1 tablet by mouth every 6 (six) hours as needed for pain.     letrozole 2.5 MG tablet  Commonly known as:  FEMARA  Take 1 tablet (2.5 mg total) by mouth daily.     multivitamin with minerals Tabs  tablet  Take 1 tablet by mouth every morning. Centrum 50+     olmesartan 20 MG tablet  Commonly known as:  BENICAR  Take 20 mg by mouth daily.     polyethylene glycol packet  Commonly known as:  MIRALAX / GLYCOLAX  Take 17 g by mouth every morning.     potassium chloride SA 20 MEQ tablet  Commonly known as:  K-DUR,KLOR-CON  Take 20 mEq by mouth daily.     promethazine 12.5 MG tablet  Commonly known as:  PHENERGAN  Take 1 tablet (12.5 mg total) by mouth every 8 (eight) hours as needed for nausea or vomiting.     traMADol 50 MG tablet  Commonly known as:  ULTRAM  Take 1 tablet (50 mg total) by mouth every 8 (eight) hours as needed for pain.     Vitamin D (Ergocalciferol) 50000 UNITS Caps capsule  Commonly known as:  DRISDOL  Take 50,000 Units by mouth every 7 (seven) days.       Follow-up Information    Follow up with [facility].   Why:  physical and occupational therapy, nurse and aide   Contact information:   [address] [place] [address] [phone]       Follow up with [name], MD. Schedule an appointment as soon as possible for a visit in 5 days.   Specialty:  Internal Medicine   Contact information:   [place] [address] [phone]        The results of significant diagnostics from this hospitalization (including imaging, microbiology, ancillary and laboratory) are listed below for reference.    Significant Diagnostic Studies: Ct Angio Chest Pe W/cm &/or Wo Cm  09/11/2014   CLINICAL DATA:  Breast cancer and atrial fibrillation.  Chest pain.  EXAM: CT ANGIOGRAPHY CHEST WITH CONTRAST  TECHNIQUE: Multidetector CT imaging of the chest was performed using the standard protocol during bolus administration of intravenous contrast. Multiplanar CT image reconstructions and MIPs were obtained to evaluate the vascular anatomy.  CONTRAST:  182mL OMNIPAQUE IOHEXOL 350 MG/ML SOLN  COMPARISON:  10/10/2012  FINDINGS:  THORACIC INLET/BODY WALL:  14 mm nodule in left thyroid gland appears stable from 2014, as does a inferior isthmic nodule measuring 11 mm.  Left breast surgery and left axillary dissection.  MEDIASTINUM:  Biatrial enlargement. No pericardial  effusion. No evidence of left atrial appendage thrombus.  There is mixing artifact in the pulmonary arteries from early timing and turbulent flow, a limiting factor. In the medial segment right middle lobe there is subsegmental branch with no visible flow. The termination of flow was relatively abrupt without branching into the neighboring vessels. In the medial basilar segments there are pulmonary arteries with gradual tapered truncated appearance compatible with shunting in the setting of consolidation. There is a linear high-density structure in the right lower lobe pulmonary artery consistent with remote vertebroplasty cement embolus. Just proximal to this area, at the interlobar pulmonary artery bifurcation, there is poor flow along the outer wall which could reflect a chronic clot.  Moderate hiatal hernia.  LUNG WINDOWS:  Hyperinflated lungs. There is dense consolidation in the right lower lobe with bronchiectasis and volume loss. There is stenosis at the level of the right lower lobe bronchus, with occlusion of the medial segment bronchus which is distorted. Similar but less extensive changes present and the medial basilar segment left lower lobe. Patchy ground-glass opacity in the right upper lobe and in the basilar lungs likely reflects scarring. Basilar opacities were obscured on the previous study, the right upper lobe opacity is stable. No edema, effusion, or air leak.  UPPER ABDOMEN:  No acute findings.  OSSEOUS:  Compression fractures of T6, T10, T11, T12, L1, and L2 superior endplate all have a chronic appearance. The T11 fracture has occurred since 2014. The patient is status post vertebroplasty at T12 and L1. Bilateral rib fractures appear chronic.  Review of  the MIP images confirms the above findings.  IMPRESSION: 1. Thrombosis of a right middle lobe subsegmental pulmonary artery, chronicity uncertain. Consider lower extremity Dopplers. 2. Consolidation in the right more than left lower lobes is favored chronic given bronchiectasis, bronchial stenoses/occlusions and poor blood flow. Cannot exclude superimposed acute pneumonia or aspiration. Chest x-ray follow-up in 6-12 weeks is suggested. 3. Hiatal hernia.   Electronically Signed   By: Monte Fantasia M.D.   On: 09/11/2014 04:02   Dg Chest Port 1 View  09/13/2014   CLINICAL DATA:  Pneumonia  EXAM: PORTABLE CHEST - 1 VIEW  COMPARISON:  09/11/2014  FINDINGS: Cardiac shadow remains enlarged. Changes of prior vertebral augmentation are seen. The lungs are well aerated bilaterally. The right basilar changes medially are stable in appearance. A hiatal hernia is again seen and stable.  IMPRESSION: No change from the prior exam. Persistent right medial lung base infiltrate is seen.   Electronically Signed   By: Inez Catalina M.D.   On: 09/13/2014 12:08   Dg Abd Acute W/chest  09/11/2014   CLINICAL DATA:  Left upper quadrant pain  EXAM: DG ABDOMEN ACUTE W/ 1V CHEST  COMPARISON:  10/12/2012  FINDINGS: Chronic cardiomegaly. The aorta is tortuous, as before. At least moderate hiatal hernia is present.  Hyperinflation and interstitial coarsening with streaky basilar lung opacities that significantly clear on abdominal imaging - consistent with atelectasis. No edema, effusion, or air leak.  No evidence of bowel obstruction or perforation. No concerning intra-abdominal mass effect or calcification.  Thoracolumbar dextroscoliosis. Lower thoracic compression deformities with 2 level vertebroplasty.  Proximal left femur ORIF with unusual eccentric appearance of the femoral neck screw, stable from 10/08/2012.  IMPRESSION: No acute findings at the chest or abdomen.   Electronically Signed   By: Monte Fantasia M.D.   On: 09/11/2014  02:35    Microbiology: Recent Results (from the past 240 hour(s))  Blood culture (  routine x 2)     Status: None (Preliminary result)   Collection Time: 09/11/14  4:45 AM  Result Value Ref Range Status   Specimen Description BLOOD BLOOD LEFT FOREARM  Final   Special Requests BOTTLES DRAWN AEROBIC AND ANAEROBIC 4ML  Final   Culture   Final    NO GROWTH 3 DAYS Performed at [facility]    Report Status PENDING  Incomplete  MRSA PCR Screening     Status: None   Collection Time: 09/11/14  4:47 AM  Result Value Ref Range Status   MRSA by PCR NEGATIVE NEGATIVE Final    Comment:        The GeneXpert MRSA Assay (FDA approved for NASAL specimens only), is one component of a comprehensive MRSA colonization surveillance program. It is not intended to diagnose MRSA infection nor to guide or monitor treatment for MRSA infections.   Blood culture (routine x 2)     Status: None (Preliminary result)   Collection Time: 09/11/14  4:57 AM  Result Value Ref Range Status   Specimen Description BLOOD RIGHT ANTECUBITAL  Final   Special Requests BOTTLES DRAWN AEROBIC AND ANAEROBIC 5ML  Final   Culture   Final    NO GROWTH 3 DAYS Performed at [facility]    Report Status PENDING  Incomplete     Labs: Basic Metabolic Panel:  Recent Labs Lab 09/11/14 0115 09/12/14 0516 09/13/14 0435  NA 137 138  --   K 4.1 3.6  --   CL 99* 100*  --   CO2 28 30  --   GLUCOSE 124* 97  --   BUN 22* 16  --   CREATININE 1.19* 1.12* 1.11*  CALCIUM 9.2 8.3*  --    Liver Function Tests:  Recent Labs Lab 09/11/14 0115  AST 31  ALT 20  ALKPHOS 59  BILITOT 1.0  PROT 7.5  ALBUMIN 4.2    Recent Labs Lab 09/11/14 0115  LIPASE 29   No results for input(s): AMMONIA in the last 168 hours. CBC:  Recent Labs Lab 09/11/14 0115 09/12/14 0516 09/13/14 0435 09/14/14 0452 09/15/14 0459  WBC 6.1 5.6 5.9 6.6 6.0  HGB 13.4 11.7* 11.5* 11.7* 11.4*  HCT 41.0 36.4 34.8* 35.7* 35.3*  MCV  102.5* 102.2* 103.6* 104.1* 102.6*  PLT 208 174 172 177 161   Cardiac Enzymes:  Recent Labs Lab 09/11/14 0831 09/11/14 1306 09/11/14 2047  TROPONINI <0.03 <0.03 <0.03   BNP: BNP (last 3 results) No results for input(s): BNP in the last 8760 hours.  ProBNP (last 3 results) No results for input(s): PROBNP in the last 8760 hours.  CBG: No results for input(s): GLUCAP in the last 168 hours.  Discussed with patient's daughter Ms. [name] prior to discharge. Updated care and answered questions.  Signed:  Vernell Leep, MD, FACP, FHM. [facility] Pager [phone]  If 7PM-7AM, please contact night-coverage [email or web address] Password [email or web address] 09/15/2014, 12:20 PM

## 2014-09-15 NOTE — Discharge Instructions (Signed)
[facility] Stay Proper nutrition can help your body recover from illness and injury.   Foods and beverages high in protein, vitamins, and minerals help rebuild muscle loss, promote healing, & reduce fall risk.   In addition to eating healthy foods, a nutrition shake is an easy, delicious way to get the nutrition you need during and after your hospital stay  It is recommended that you continue to drink 2 bottles per day of:       Ensure Enlive for at least 1 month (30 days) after your hospital stay   Tips for adding a nutrition shake into your routine: As allowed, drink one with vitamins or medications instead of water or juice Enjoy one as a tasty mid-morning or afternoon snack Drink cold or make a milkshake out of it Drink one instead of milk with cereal or snacks Use as a coffee creamer   Available at the following grocery stores and pharmacies:           * [place] [phone]            For COUPONS visit: www.ensure.com/join or http://dawson-may.com/   Suggested Substitutions Ensure Plus = Boost Plus = Carnation Breakfast Essentials = Boost Compact Ensure Active Clear = Boost Breeze Glucerna Shake = Boost Glucose Control = Carnation Breakfast Essentials SUGAR FREE    Pneumonia Pneumonia is an infection of the lungs.  CAUSES Pneumonia may be caused by bacteria or a virus. Usually, these infections are caused by breathing infectious particles into the lungs (respiratory tract). SIGNS AND SYMPTOMS   Cough.  Fever.  Chest pain.  Increased rate of breathing.  Wheezing.  Mucus production. DIAGNOSIS  If you have the common symptoms of pneumonia, your health care provider will typically confirm the diagnosis with a chest X-ray. The X-ray will show an abnormality in the lung (pulmonary  infiltrate) if you have pneumonia. Other tests of your blood, urine, or sputum may be done to find the specific cause of your pneumonia. Your health care provider may also do tests (blood gases or pulse oximetry) to see how well your lungs are working. TREATMENT  Some forms of pneumonia may be spread to other people when you cough or sneeze. You may be asked to wear a mask before and during your exam. Pneumonia that is caused by bacteria is treated with antibiotic medicine. Pneumonia that is caused by the influenza virus may be treated with an antiviral medicine. Most other viral infections must run their course. These infections will not respond to antibiotics.  HOME CARE INSTRUCTIONS   Cough suppressants may be used if you are losing too much rest. However, coughing protects you by clearing your lungs. You should avoid using cough suppressants if you can.  Your health care provider may have prescribed medicine if he or she thinks your pneumonia is caused by bacteria or influenza. Finish your medicine even if you start to feel better.  Your health care provider may also prescribe an expectorant. This loosens the mucus to be coughed up.  Take medicines only as directed by your health care provider.  Do not smoke. Smoking is a common cause of bronchitis and can contribute to pneumonia. If you  are a smoker and continue to smoke, your cough may last several weeks after your pneumonia has cleared.  A cold steam vaporizer or humidifier in your room or home may help loosen mucus.  Coughing is often worse at night. Sleeping in a semi-upright position in a recliner or using a couple pillows under your head will help with this.  Get rest as you feel it is needed. Your body will usually let you know when you need to rest. PREVENTION A pneumococcal shot (vaccine) is available to prevent a common bacterial cause of pneumonia. This is usually suggested for:  People over 71 years old.  Patients on  chemotherapy.  People with chronic lung problems, such as bronchitis or emphysema.  People with immune system problems. If you are over 65 or have a high risk condition, you may receive the pneumococcal vaccine if you have not received it before. In some countries, a routine influenza vaccine is also recommended. This vaccine can help prevent some cases of pneumonia.You may be offered the influenza vaccine as part of your care. If you smoke, it is time to quit. You may receive instructions on how to stop smoking. Your health care provider can provide medicines and counseling to help you quit. SEEK MEDICAL CARE IF: You have a fever. SEEK IMMEDIATE MEDICAL CARE IF:   Your illness becomes worse. This is especially true if you are elderly or weakened from any other disease.  You cannot control your cough with suppressants and are losing sleep.  You begin coughing up blood.  You develop pain which is getting worse or is uncontrolled with medicines.  Any of the symptoms which initially brought you in for treatment are getting worse rather than better.  You develop shortness of breath or chest pain. MAKE SURE YOU:   Understand these instructions.  Will watch your condition.  Will get help right away if you are not doing well or get worse. Document Released: 01/08/2005 Document Revised: 05/25/2013 Document Reviewed: 03/30/2010 [facility] Patient Information 2015 [place], [place]. This information is not intended to replace advice given to you by your health care provider. Make sure you discuss any questions you have with your health care provider.

## 2014-09-15 NOTE — Progress Notes (Signed)
Physical Therapy Treatment Patient Details Name: Holly Weaver MRN: [id number] DOB: May 06, 1923 Today's Date: Oct 13, 2014    History of Present Illness 81 y.o. female with past medical history of breast cancer, hypertension, atrial fibrillation on anticoagulation with apixaban who presented to [facility] with reports of left-sided chest pain started on the day prior to the admission and admitted for acute respiratory failure with hypoxia / lobar pneumonia, unspecified organism     PT Comments    Pt feeling better and ambulated in hallway with SPO2 room air 94%.  Pt states possible d/c home today.  Follow Up Recommendations  Home health PT     Equipment Recommendations  None recommended by PT    Recommendations for Other Services       Precautions / Restrictions Precautions Precautions: Fall    Mobility  Bed Mobility               General bed mobility comments: pt sitting on bsc upon entering room, left pt sitting EOB upon departure (with bed alarm on)  Transfers Overall transfer level: Needs assistance Equipment used: Rolling walker (2 wheeled) Transfers: Sit to/from Stand Sit to Stand: Min guard         General transfer comment: verbal cues for safety  Ambulation/Gait Ambulation/Gait assistance: Min guard Ambulation Distance (Feet): 120 Feet Assistive device: Rolling walker (2 wheeled) Gait Pattern/deviations: Step-through pattern;Decreased stride length     General Gait Details: slow pace but steady with RW, SpO2 94% room air during ambulation and 98% upon return to room   Stairs            Wheelchair Mobility    Modified Rankin (Stroke Patients Only)       Balance                                    Cognition                            Exercises      General Comments        Pertinent Vitals/Pain Pain Assessment: No/denies pain    Home Living                      Prior Function             PT Goals (current goals can now be found in the care plan section) Progress towards PT goals: Progressing toward goals    Frequency  Min 3X/week    PT Plan Current plan remains appropriate    Co-evaluation             End of Session Equipment Utilized During Treatment: Gait belt Activity Tolerance: Patient tolerated treatment well Patient left: in bed;with call bell/phone within reach;with bed alarm set     Time: [id number] PT Time Calculation (min) (ACUTE ONLY): 16 min  Charges:  $Gait Training: 8-22 mins                    G Codes:      Holly Weaver,Holly Weaver 10-13-2014, 10:53 AM Carmelia Bake, PT, DPT Oct 13, 2014 Pager: [phone]

## 2014-09-15 NOTE — Progress Notes (Signed)
ANTICOAGULATION CONSULT NOTE - Follow Up Consult  Pharmacy Consult for Eliquis Indication: atrial fibrillation  Allergies  Allergen Reactions  . Codeine Nausea And Vomiting    Patient Measurements: Height: 5\' 4"  (162.6 cm) Weight: 104 lb 0.9 oz (47.2 kg) IBW/kg (Calculated) : 54.7  Vital Signs: Temp: 98.2 F (36.8 C) (08/24 0530) Temp Source: Oral (08/24 0530) BP: 144/60 mmHg (08/24 0530) Pulse Rate: 65 (08/24 0530)  Labs:  Recent Labs  09/13/14 0435 09/14/14 0452 09/15/14 0459  HGB 11.5* 11.7* 11.4*  HCT 34.8* 35.7* 35.3*  PLT 172 177 161  CREATININE 1.11*  --   --     Estimated Creatinine Clearance: 25.6 mL/min (by C-G formula based on Cr of 1.11).   Assessment: Patient's an 81 y.o F on Eliquis prior to admission for afib.  MD requested to resume home Eliquis back on 8/21.  - chest CTA on 8/20 :"Thrombosis of a right middle lobe subsegmental pulmonary artery, chronicity uncertain"  - LE doppler neg for DVT - cbc stable - scr 1.11 (crcl~25) , age 44, weight 47 kg    Plan:  - continue home Eliquis dose of 2.5mg  BID (dose is appropriate for age and weight). - Pharmacy will sign off note writing, but will continue to follow patient peripherally   Alexes Menchaca P 09/15/2014,8:45 AM

## 2014-09-16 LAB — CULTURE, BLOOD (ROUTINE X 2)
Culture: NO GROWTH
Culture: NO GROWTH

## 2014-09-22 DIAGNOSIS — J189 Pneumonia, unspecified organism: Secondary | ICD-10-CM | POA: Diagnosis not present

## 2014-10-14 DIAGNOSIS — Z681 Body mass index (BMI) 19 or less, adult: Secondary | ICD-10-CM | POA: Diagnosis not present

## 2014-10-14 DIAGNOSIS — R05 Cough: Secondary | ICD-10-CM | POA: Diagnosis not present

## 2014-10-14 DIAGNOSIS — J189 Pneumonia, unspecified organism: Secondary | ICD-10-CM | POA: Diagnosis not present

## 2014-10-14 DIAGNOSIS — I10 Essential (primary) hypertension: Secondary | ICD-10-CM | POA: Diagnosis not present

## 2014-10-14 DIAGNOSIS — J479 Bronchiectasis, uncomplicated: Secondary | ICD-10-CM | POA: Diagnosis not present

## 2014-10-14 DIAGNOSIS — I480 Paroxysmal atrial fibrillation: Secondary | ICD-10-CM | POA: Diagnosis not present

## 2014-10-14 DIAGNOSIS — I2699 Other pulmonary embolism without acute cor pulmonale: Secondary | ICD-10-CM | POA: Diagnosis not present

## 2014-10-14 DIAGNOSIS — R0789 Other chest pain: Secondary | ICD-10-CM | POA: Diagnosis not present

## 2014-10-22 DIAGNOSIS — J189 Pneumonia, unspecified organism: Secondary | ICD-10-CM | POA: Diagnosis not present

## 2014-11-02 DIAGNOSIS — I480 Paroxysmal atrial fibrillation: Secondary | ICD-10-CM | POA: Diagnosis not present

## 2014-11-02 DIAGNOSIS — J479 Bronchiectasis, uncomplicated: Secondary | ICD-10-CM | POA: Diagnosis not present

## 2014-11-02 DIAGNOSIS — Z7901 Long term (current) use of anticoagulants: Secondary | ICD-10-CM | POA: Diagnosis not present

## 2014-11-02 DIAGNOSIS — I2699 Other pulmonary embolism without acute cor pulmonale: Secondary | ICD-10-CM | POA: Diagnosis not present

## 2014-11-02 DIAGNOSIS — Z23 Encounter for immunization: Secondary | ICD-10-CM | POA: Diagnosis not present

## 2014-11-02 DIAGNOSIS — Z6820 Body mass index (BMI) 20.0-20.9, adult: Secondary | ICD-10-CM | POA: Diagnosis not present

## 2014-11-02 DIAGNOSIS — M810 Age-related osteoporosis without current pathological fracture: Secondary | ICD-10-CM | POA: Diagnosis not present

## 2014-11-19 DIAGNOSIS — H25811 Combined forms of age-related cataract, right eye: Secondary | ICD-10-CM | POA: Diagnosis not present

## 2014-11-19 DIAGNOSIS — Z961 Presence of intraocular lens: Secondary | ICD-10-CM | POA: Diagnosis not present

## 2014-11-19 DIAGNOSIS — H353131 Nonexudative age-related macular degeneration, bilateral, early dry stage: Secondary | ICD-10-CM | POA: Diagnosis not present

## 2014-12-24 ENCOUNTER — Encounter (HOSPITAL_COMMUNITY): Payer: Self-pay

## 2014-12-24 ENCOUNTER — Ambulatory Visit (HOSPITAL_COMMUNITY)
Admission: RE | Admit: 2014-12-24 | Discharge: 2014-12-24 | Disposition: A | Discharge status: Home / Self Care | Payer: Medicare Other | Source: Ambulatory Visit | Attending: Internal Medicine | Admitting: Internal Medicine

## 2014-12-24 DIAGNOSIS — M810 Age-related osteoporosis without current pathological fracture: Secondary | ICD-10-CM | POA: Diagnosis not present

## 2014-12-24 MED ORDER — DENOSUMAB 60 MG/ML ~~LOC~~ SOLN
60.0000 mg | Freq: Once | SUBCUTANEOUS | Status: AC
  Administered 2014-12-24: 60 mg via SUBCUTANEOUS
  Filled 2014-12-24: qty 1

## 2014-12-24 NOTE — Discharge Instructions (Signed)
PROLIA °Denosumab injection °What is this medicine? °DENOSUMAB (den oh sue mab) slows bone breakdown. Prolia is used to treat osteoporosis in women after menopause and in men. Xgeva is used to prevent bone fractures and other bone problems caused by cancer bone metastases. Xgeva is also used to treat giant cell tumor of the bone. °This medicine may be used for other purposes; ask your health care provider or pharmacist if you have questions. °What should I tell my health care provider before I take this medicine? °They need to know if you have any of these conditions: °-dental disease °-eczema °-infection or history of infections °-kidney disease or on dialysis °-low blood calcium or vitamin D °-malabsorption syndrome °-scheduled to have surgery or tooth extraction °-taking medicine that contains denosumab °-thyroid or parathyroid disease °-an unusual reaction to denosumab, other medicines, foods, dyes, or preservatives °-pregnant or trying to get pregnant °-breast-feeding °How should I use this medicine? °This medicine is for injection under the skin. It is given by a health care professional in a hospital or clinic setting. °If you are getting Prolia, a special MedGuide will be given to you by the pharmacist with each prescription and refill. Be sure to read this information carefully each time. °For Prolia, talk to your pediatrician regarding the use of this medicine in children. Special care may be needed. For Xgeva, talk to your pediatrician regarding the use of this medicine in children. While this drug may be prescribed for children as young as 13 years for selected conditions, precautions do apply. °Overdosage: If you think you have taken too much of this medicine contact a poison control center or emergency room at once. °NOTE: This medicine is only for you. Do not share this medicine with others. °What if I miss a dose? °It is important not to miss your dose. Call your doctor or health care professional if  you are unable to keep an appointment. °What may interact with this medicine? °Do not take this medicine with any of the following medications: °-other medicines containing denosumab °This medicine may also interact with the following medications: °-medicines that suppress the immune system °-medicines that treat cancer °-steroid medicines like prednisone or cortisone °This list may not describe all possible interactions. Give your health care provider a list of all the medicines, herbs, non-prescription drugs, or dietary supplements you use. Also tell them if you smoke, drink alcohol, or use illegal drugs. Some items may interact with your medicine. °What should I watch for while using this medicine? °Visit your doctor or health care professional for regular checks on your progress. Your doctor or health care professional may order blood tests and other tests to see how you are doing. °Call your doctor or health care professional if you get a cold or other infection while receiving this medicine. Do not treat yourself. This medicine may decrease your body's ability to fight infection. °You should make sure you get enough calcium and vitamin D while you are taking this medicine, unless your doctor tells you not to. Discuss the foods you eat and the vitamins you take with your health care professional. °See your dentist regularly. Brush and floss your teeth as directed. Before you have any dental work done, tell your dentist you are receiving this medicine. °Do not become pregnant while taking this medicine or for 5 months after stopping it. Women should inform their doctor if they wish to become pregnant or think they might be pregnant. There is a potential for serious side   effects to an unborn child. Talk to your health care professional or pharmacist for more information. °What side effects may I notice from receiving this medicine? °Side effects that you should report to your doctor or health care professional as  soon as possible: °-allergic reactions like skin rash, itching or hives, swelling of the face, lips, or tongue °-breathing problems °-chest pain °-fast, irregular heartbeat °-feeling faint or lightheaded, falls °-fever, chills, or any other sign of infection °-muscle spasms, tightening, or twitches °-numbness or tingling °-skin blisters or bumps, or is dry, peels, or red °-slow healing or unexplained pain in the mouth or jaw °-unusual bleeding or bruising °Side effects that usually do not require medical attention (Report these to your doctor or health care professional if they continue or are bothersome.): °-muscle pain °-stomach upset, gas °This list may not describe all possible side effects. Call your doctor for medical advice about side effects. You may report side effects to FDA at 1-800-FDA-1088. °Where should I keep my medicine? °This medicine is only given in a clinic, doctor's office, or other health care setting and will not be stored at home. °NOTE: This sheet is a summary. It may not cover all possible information. If you have questions about this medicine, talk to your doctor, pharmacist, or health care provider. °  °© 2016, Elsevier/Gold Standard. (2011-07-09 12:37:47) °Osteoporosis °Osteoporosis is the thinning and loss of density in the bones. Osteoporosis makes the bones more brittle, fragile, and likely to break (fracture). Over time, osteoporosis can cause the bones to become so weak that they fracture after a simple fall. The bones most likely to fracture are the bones in the hip, wrist, and spine. °CAUSES  °The exact cause is not known. °RISK FACTORS °Anyone can develop osteoporosis. You may be at greater risk if you have a family history of the condition or have poor nutrition. You may also have a higher risk if you are:  °· Female.   °· 50 years old or older. °· A smoker. °· Not physically active.   °· White or Asian. °· Slender. °SIGNS AND SYMPTOMS  °A fracture might be the first sign of the  disease, especially if it results from a fall or injury that would not usually cause a bone to break. Other signs and symptoms include:  °· Low back and neck pain. °· Stooped posture. °· Height loss. °DIAGNOSIS  °To make a diagnosis, your health care provider may: °· Take a medical history. °· Perform a physical exam. °· Order tests, such as: °¨ A bone mineral density test. °¨ A dual-energy X-ray absorptiometry test. °TREATMENT  °The goal of osteoporosis treatment is to strengthen your bones to reduce your risk of a fracture. Treatment may involve: °· Making lifestyle changes, such as: °¨ Eating a diet rich in calcium. °¨ Doing weight-bearing and muscle-strengthening exercises. °¨ Stopping tobacco use. °¨ Limiting alcohol intake. °· Taking medicine to slow the process of bone loss or to increase bone density. °· Monitoring your levels of calcium and vitamin D. °HOME CARE INSTRUCTIONS °· Include calcium and vitamin D in your diet. Calcium is important for bone health, and vitamin D helps the body absorb calcium. °· Perform weight-bearing and muscle-strengthening exercises as directed by your health care provider. °· Do not use any tobacco products, including cigarettes, chewing tobacco, and electronic cigarettes. If you need help quitting, ask your health care provider. °· Limit your alcohol intake. °· Take medicines only as directed by your health care provider. °· Keep all   follow-up visits as directed by your health care provider. This is important. °· Take precautions at home to lower your risk of falling, such as: °¨ Keeping rooms well lit and clutter free. °¨ Installing safety rails on stairs. °¨ Using rubber mats in the bathroom and other areas that are often wet or slippery. °SEEK IMMEDIATE MEDICAL CARE IF:  °You fall or injure yourself.  °  °This information is not intended to replace advice given to you by your health care provider. Make sure you discuss any questions you have with your health care  provider. °  °Document Released: 10/18/2004 Document Revised: 01/29/2014 Document Reviewed: 06/18/2013 °Elsevier Interactive Patient Education ©2016 [employer]vier Inc. ° ° °

## 2015-02-15 ENCOUNTER — Other Ambulatory Visit: Payer: Self-pay | Admitting: Oncology

## 2015-02-15 ENCOUNTER — Other Ambulatory Visit: Payer: Self-pay

## 2015-02-15 DIAGNOSIS — Z853 Personal history of malignant neoplasm of breast: Secondary | ICD-10-CM

## 2015-03-02 DIAGNOSIS — I10 Essential (primary) hypertension: Secondary | ICD-10-CM | POA: Diagnosis not present

## 2015-03-02 DIAGNOSIS — N179 Acute kidney failure, unspecified: Secondary | ICD-10-CM | POA: Diagnosis not present

## 2015-03-02 DIAGNOSIS — Z0000 Encounter for general adult medical examination without abnormal findings: Secondary | ICD-10-CM | POA: Diagnosis not present

## 2015-03-02 DIAGNOSIS — Z7901 Long term (current) use of anticoagulants: Secondary | ICD-10-CM | POA: Diagnosis not present

## 2015-03-02 DIAGNOSIS — J479 Bronchiectasis, uncomplicated: Secondary | ICD-10-CM | POA: Diagnosis not present

## 2015-03-02 DIAGNOSIS — R0609 Other forms of dyspnea: Secondary | ICD-10-CM | POA: Diagnosis not present

## 2015-03-02 DIAGNOSIS — R7301 Impaired fasting glucose: Secondary | ICD-10-CM | POA: Diagnosis not present

## 2015-03-02 DIAGNOSIS — M810 Age-related osteoporosis without current pathological fracture: Secondary | ICD-10-CM | POA: Diagnosis not present

## 2015-03-02 DIAGNOSIS — Z6820 Body mass index (BMI) 20.0-20.9, adult: Secondary | ICD-10-CM | POA: Diagnosis not present

## 2015-03-02 DIAGNOSIS — Z1389 Encounter for screening for other disorder: Secondary | ICD-10-CM | POA: Diagnosis not present

## 2015-03-02 DIAGNOSIS — I480 Paroxysmal atrial fibrillation: Secondary | ICD-10-CM | POA: Diagnosis not present

## 2015-03-02 DIAGNOSIS — Z23 Encounter for immunization: Secondary | ICD-10-CM | POA: Diagnosis not present

## 2015-03-02 DIAGNOSIS — E784 Other hyperlipidemia: Secondary | ICD-10-CM | POA: Diagnosis not present

## 2015-03-21 ENCOUNTER — Ambulatory Visit (HOSPITAL_COMMUNITY): Payer: Medicare Other | Attending: Internal Medicine

## 2015-03-21 ENCOUNTER — Other Ambulatory Visit: Payer: Self-pay

## 2015-03-21 ENCOUNTER — Other Ambulatory Visit (HOSPITAL_COMMUNITY): Payer: Self-pay | Admitting: Internal Medicine

## 2015-03-21 DIAGNOSIS — R0600 Dyspnea, unspecified: Secondary | ICD-10-CM | POA: Diagnosis not present

## 2015-03-21 DIAGNOSIS — I119 Hypertensive heart disease without heart failure: Secondary | ICD-10-CM | POA: Insufficient documentation

## 2015-03-21 DIAGNOSIS — Z8249 Family history of ischemic heart disease and other diseases of the circulatory system: Secondary | ICD-10-CM | POA: Diagnosis not present

## 2015-03-21 DIAGNOSIS — I071 Rheumatic tricuspid insufficiency: Secondary | ICD-10-CM | POA: Insufficient documentation

## 2015-03-21 DIAGNOSIS — E785 Hyperlipidemia, unspecified: Secondary | ICD-10-CM | POA: Diagnosis not present

## 2015-03-21 DIAGNOSIS — I351 Nonrheumatic aortic (valve) insufficiency: Secondary | ICD-10-CM | POA: Insufficient documentation

## 2015-03-21 DIAGNOSIS — I340 Nonrheumatic mitral (valve) insufficiency: Secondary | ICD-10-CM | POA: Diagnosis not present

## 2015-04-04 ENCOUNTER — Encounter: Payer: Self-pay | Admitting: Emergency Medicine

## 2015-04-04 ENCOUNTER — Ambulatory Visit (INDEPENDENT_AMBULATORY_CARE_PROVIDER_SITE_OTHER): Payer: Medicare Other | Admitting: Emergency Medicine

## 2015-04-04 VITALS — BP 156/82 | HR 79 | Ht 64.0 in | Wt 99.6 lb

## 2015-04-04 DIAGNOSIS — I2782 Chronic pulmonary embolism: Secondary | ICD-10-CM

## 2015-04-04 DIAGNOSIS — R0609 Other forms of dyspnea: Secondary | ICD-10-CM

## 2015-04-04 DIAGNOSIS — J479 Bronchiectasis, uncomplicated: Secondary | ICD-10-CM

## 2015-04-04 NOTE — Assessment & Plan Note (Signed)
Noted on CT scan of the chest from 09/11/14. The chronicity of the right pulmonary artery PE was unclear but suspect that it was chronic and that he never been adequately treated even though she had been on anticoagulation for atrial fibrillation. There is associated right lower lobe consolidation that I suspect is infarct plus pneumonia. She was treated with Eliquis 2.5mg  bid, unclear to me whether she ever received therapeutic dose Eliquis for PE. She may yet need to have her anticoagulation changed to adequately treat venous thromboembolism.

## 2015-04-04 NOTE — Progress Notes (Signed)
Subjective:    Patient ID: Holly Weaver, female    DOB: 08-27-1923, 82 y.o.   MRN: [id number]  HPI 82 year old never smoker with a history of atrial fibrillation, left breast cancer, hypertension with chronic diastolic CHF. She tells that she was diagnosed with a right pulmonary artery pulmonary embolism in 09/11/14 prompted by severe pleuritic pain. There was RLL infiltrate vs infarct on that scan. When she exerts herself she feels discomfort at her lung bases, she describes as a "heaviness".  She also has some evidence for bronchiectatic change in her basilar airways on that scan. She referred for further evaluation of her dyspnea and also her abnormal CT scan.  She has occasional cough w allergies, no significant mucous production. She was seen by Dr Brigitte Pulse 10/16 and started on Breo, not really reliable with it.  Not sure she has seen any change on it.    Review of Systems  Constitutional: Negative for fever and unexpected weight change.  HENT: Negative for congestion, dental problem, ear pain, nosebleeds, postnasal drip, rhinorrhea, sinus pressure, sneezing, sore throat and trouble swallowing.   Eyes: Positive for redness. Negative for itching.  Respiratory: Positive for wheezing. Negative for cough, chest tightness and shortness of breath.   Cardiovascular: Positive for leg swelling. Negative for palpitations.  Gastrointestinal: Negative for nausea and vomiting.  Genitourinary: Negative for dysuria.  Musculoskeletal: Negative for joint swelling.  Skin: Negative for rash.  Neurological: Negative for headaches.  Hematological: Bruises/bleeds easily.  Psychiatric/Behavioral: Negative for dysphoric mood. The patient is not nervous/anxious.     Past Medical History  Diagnosis Date  . Hypertension   . Osteoporosis   . Scoliosis   . Atrial fibrillation ([place])   . Compression fracture 09/21/2012  . Dyslipidemia 09/21/2012  . MVP (mitral valve prolapse) 09/23/2012  . Breast cancer  ([place]) ~ 2006    "left" (10/09/2012)  . Heart murmur   . Shortness of breath     "just now; nose is dry" (10/09/2012)  . History of blood transfusion     "years and years ago; I was anemic" (10/09/2012)  . Cervical spondylolysis 09/21/2012  . Osteoarthritis 09/21/2012  . Arthritis     "hands and legs" (10/09/2012)  . Chronic lower back pain      Family History  Problem Relation Age of Onset  . Cancer Mother   . CAD Father   . Cancer Other      Social History   Social History  . Marital Status: Divorced    Spouse Name: N/A  . Number of Children: N/A  . Years of Education: N/A   Occupational History  . Not on file.   Social History Main Topics  . Smoking status: Never Smoker   . Smokeless tobacco: Never Used  . Alcohol Use: No  . Drug Use: No  . Sexual Activity: No   Other Topics Concern  . Not on file   Social History Narrative     Allergies  Allergen Reactions  . Codeine Nausea And Vomiting     Outpatient Prescriptions Prior to Visit  Medication Sig Dispense Refill  . ALPRAZolam (XANAX) 0.25 MG tablet Take one tablet by mouth every 12 hours as needed for severe anxiety 60 tablet 5  . amiodarone (PACERONE) 200 MG tablet Take 200 mg by mouth every morning.     Marland Kitchen atorvastatin (LIPITOR) 40 MG tablet Take 40 mg by mouth every evening.     . calcium-vitamin D (OSCAL WITH D) 500-200 MG-UNIT per  tablet Take 1 tablet by mouth every morning.     . denosumab (PROLIA) 60 MG/ML SOLN injection Inject 60 mg into the skin every 6 (six) months. Administer in upper arm, thigh, or abdomen    . ELIQUIS 2.5 MG TABS tablet Take 2.5 mg by mouth 2 (two) times daily.     . feeding supplement, ENSURE ENLIVE, (ENSURE ENLIVE) LIQD Take 237 mLs by mouth 2 (two) times daily between meals.    . furosemide (LASIX) 40 MG tablet Take 1 tablet (40 mg total) by mouth daily. 30 tablet 0  . letrozole (FEMARA) 2.5 MG tablet Take 1 tablet (2.5 mg total) by mouth daily. 90 tablet 4  . Multiple Vitamin  (MULTIVITAMIN WITH MINERALS) TABS Take 1 tablet by mouth every morning. Centrum 50+    . olmesartan (BENICAR) 20 MG tablet Take 20 mg by mouth daily.    . polyethylene glycol (MIRALAX / GLYCOLAX) packet Take 17 g by mouth every morning.     . potassium chloride SA (K-DUR,KLOR-CON) 20 MEQ tablet Take 20 mEq by mouth daily.     . promethazine (PHENERGAN) 12.5 MG tablet Take 1 tablet (12.5 mg total) by mouth every 8 (eight) hours as needed for nausea or vomiting. 15 tablet 0  . traMADol (ULTRAM) 50 MG tablet Take 1 tablet (50 mg total) by mouth every 8 (eight) hours as needed for pain. 50 tablet 0  . Vitamin D, Ergocalciferol, (DRISDOL) 50000 UNITS CAPS Take 50,000 Units by mouth every 7 (seven) days.    Marland Kitchen amoxicillin-clavulanate (AUGMENTIN) 500-125 MG per tablet Take 1 tablet (500 mg total) by mouth 2 (two) times daily. (Patient not taking: Reported on 04/04/2015) 4 tablet 0  . HYDROcodone-acetaminophen (NORCO) 7.5-325 MG per tablet Take 1 tablet by mouth every 6 (six) hours as needed for pain. Reported on 04/04/2015     No facility-administered medications prior to visit.         Objective:   Physical Exam  Filed Vitals:   04/04/15 1557  BP: 156/82  Pulse: 79  Height: 5\' 4"  (1.626 m)  Weight: 99 lb 9.6 oz (45.178 kg)  SpO2: 99%   Gen: Pleasant, well-nourished, in no distress,  normal affect  ENT: No lesions,  mouth clear,  oropharynx clear, no postnasal drip  Neck: No JVD, no TMG, no carotid bruits  Lungs: No use of accessory muscles, no dullness to percussion, clear without rales or rhonchi  Cardiovascular: RRR, heart sounds normal, no murmur or gallops, no peripheral edema  Musculoskeletal: No deformities, no cyanosis or clubbing  Neuro: alert, non focal  Skin: Warm, no lesions or rashes     Assessment & Plan:  Dyspnea on exertion She describes this as a basilar heaviness and inability to inspire deeply. I wonder whether she may be describing increased risk for effort to  create diaphragmatic excursion. It only happens with significant exertion. Certainly we could correlate this with her basilar pulmonary infarct/prior pneumonia following her PE. I believe she needs a repeat CT scan of the chest to better characterize her basilar opacity. We will also perform pulmonary function testing at her next visit. She has been treated with [facility] and is unclear whether it has helped her. Will await PFT and consider stopping this medication depending on results.  Chronic pulmonary embolism (HCC) Noted on CT scan of the chest from 09/11/14. The chronicity of the right pulmonary artery PE was unclear but suspect that it was chronic and that he never been adequately treated even  though she had been on anticoagulation for atrial fibrillation. There is associated right lower lobe consolidation that I suspect is infarct plus pneumonia. She was treated with Eliquis 2.5mg  bid, unclear to me whether she ever received therapeutic dose Eliquis for PE. She may yet need to have her anticoagulation changed to adequately treat venous thromboembolism.

## 2015-04-04 NOTE — Assessment & Plan Note (Addendum)
She describes this as a basilar heaviness and inability to inspire deeply. I wonder whether she may be describing increased risk for effort to create diaphragmatic excursion. It only happens with significant exertion. Certainly we could correlate this with her basilar pulmonary infarct/prior pneumonia following her PE. I believe she needs a repeat CT scan of the chest to better characterize her basilar opacity. We will also perform pulmonary function testing at her next visit. She has been treated with [facility] and is unclear whether it has helped her. Will await PFT and consider stopping this medication depending on results.

## 2015-04-04 NOTE — Patient Instructions (Addendum)
We will perform a CT scan of the chest without contrast to evaluate your right lower lobe and for possible changes consistent with bronchiectasis Walking oximetry today on room air We will confirm that your current dose of Eliquis is adequate.  We'll obtain your office notes from Dr Brigitte Pulse.  Full pulmonary function testing Follow with Dr Lamonte Sakai next available to review your studies

## 2015-04-08 ENCOUNTER — Inpatient Hospital Stay: Admission: RE | Admit: 2015-04-08 | Payer: Medicare Other | Source: Ambulatory Visit

## 2015-04-12 ENCOUNTER — Ambulatory Visit (INDEPENDENT_AMBULATORY_CARE_PROVIDER_SITE_OTHER)
Admission: RE | Admit: 2015-04-12 | Discharge: 2015-04-12 | Disposition: A | Discharge status: Home / Self Care | Payer: Medicare Other | Source: Ambulatory Visit | Attending: Emergency Medicine | Admitting: Emergency Medicine

## 2015-04-12 DIAGNOSIS — J479 Bronchiectasis, uncomplicated: Secondary | ICD-10-CM

## 2015-05-13 DIAGNOSIS — M810 Age-related osteoporosis without current pathological fracture: Secondary | ICD-10-CM | POA: Diagnosis not present

## 2015-05-13 DIAGNOSIS — N179 Acute kidney failure, unspecified: Secondary | ICD-10-CM | POA: Diagnosis not present

## 2015-05-24 DIAGNOSIS — M79675 Pain in left toe(s): Secondary | ICD-10-CM | POA: Diagnosis not present

## 2015-05-24 DIAGNOSIS — L602 Onychogryphosis: Secondary | ICD-10-CM | POA: Diagnosis not present

## 2015-05-26 ENCOUNTER — Ambulatory Visit
Admission: RE | Admit: 2015-05-26 | Discharge: 2015-05-26 | Disposition: A | Discharge status: Home / Self Care | Payer: Medicare Other | Source: Ambulatory Visit | Attending: Oncology | Admitting: Oncology

## 2015-05-26 DIAGNOSIS — Z853 Personal history of malignant neoplasm of breast: Secondary | ICD-10-CM

## 2015-05-26 DIAGNOSIS — R922 Inconclusive mammogram: Secondary | ICD-10-CM | POA: Diagnosis not present

## 2015-05-31 ENCOUNTER — Other Ambulatory Visit (HOSPITAL_BASED_OUTPATIENT_CLINIC_OR_DEPARTMENT_OTHER): Payer: Medicare Other

## 2015-05-31 ENCOUNTER — Telehealth: Payer: Self-pay | Admitting: Oncology

## 2015-05-31 ENCOUNTER — Other Ambulatory Visit: Payer: Medicare Other

## 2015-05-31 ENCOUNTER — Ambulatory Visit (HOSPITAL_BASED_OUTPATIENT_CLINIC_OR_DEPARTMENT_OTHER): Payer: Medicare Other | Admitting: Oncology

## 2015-05-31 ENCOUNTER — Ambulatory Visit: Payer: Medicare Other | Admitting: Oncology

## 2015-05-31 VITALS — BP 159/42 | HR 71 | Temp 97.9°F | Resp 18 | Ht 64.0 in | Wt 103.7 lb

## 2015-05-31 DIAGNOSIS — M810 Age-related osteoporosis without current pathological fracture: Secondary | ICD-10-CM | POA: Diagnosis not present

## 2015-05-31 DIAGNOSIS — C50919 Malignant neoplasm of unspecified site of unspecified female breast: Secondary | ICD-10-CM

## 2015-05-31 DIAGNOSIS — C50412 Malignant neoplasm of upper-outer quadrant of left female breast: Secondary | ICD-10-CM

## 2015-05-31 LAB — CBC WITH DIFFERENTIAL/PLATELET
BASO%: 0.7 % (ref 0.0–2.0)
Basophils Absolute: 0 10*3/uL (ref 0.0–0.1)
EOS%: 1.9 % (ref 0.0–7.0)
Eosinophils Absolute: 0.1 10*3/uL (ref 0.0–0.5)
HEMATOCRIT: 39.2 % (ref 34.8–46.6)
HEMOGLOBIN: 12.8 g/dL (ref 11.6–15.9)
LYMPH#: 1.1 10*3/uL (ref 0.9–3.3)
LYMPH%: 17.5 % (ref 14.0–49.7)
MCH: 33.5 pg (ref 25.1–34.0)
MCHC: 32.6 g/dL (ref 31.5–36.0)
MCV: 102.7 fL — ABNORMAL HIGH (ref 79.5–101.0)
MONO#: 0.6 10*3/uL (ref 0.1–0.9)
MONO%: 9.3 % (ref 0.0–14.0)
NEUT%: 70.6 % (ref 38.4–76.8)
NEUTROS ABS: 4.3 10*3/uL (ref 1.5–6.5)
PLATELETS: 160 10*3/uL (ref 145–400)
RBC: 3.82 10*6/uL (ref 3.70–5.45)
RDW: 16 % — AB (ref 11.2–14.5)
WBC: 6 10*3/uL (ref 3.9–10.3)

## 2015-05-31 MED ORDER — LETROZOLE 2.5 MG PO TABS: 2.5000 mg | ORAL_TABLET | Freq: Every day | ORAL | Status: DC

## 2015-05-31 NOTE — Progress Notes (Signed)
[place]  Telephone:([phone] [phone]) [phone]     ID: Holly Weaver OB: 01-Mar-1923  MR#: [id number]  OFH#:[id number]  PCP: [name], MD GYN:   SU:  OTHER MD: Meridee Score  CHIEF COMPLAINT:Estrogen receptor positive breast cancer  CURRENT TREATMENT: Letrozole  BREAST CANCER HISTORY: From Dr. Collier [name]'s intake note dated 03/28/2005  Ms. Holly Weaver only had two mammograms in her lifetime.  She noted some change in her left breast, but only about a year ago.  She did undergo a mammogram on 02/16/2005, which showed a possible mass in the left breast.  Left diagnostic mammogram and ultrasound was performed on 03/07/2005, this showed a spiculated mass in the left breast.  Core biopsy was recommended.  Sonographically, this was a hypoechoic mass measuring 1.9 x 1.9 cm at the 2 o'clock position approximately 3.0 cm from the nipple.  Biopsy took place on 03/07/2005, which showed invasive mammary carcinoma intermediate grade.  This is strongly ER and PR positive 95% and 13% respectively.  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  The patient's subsequent history is as detailed below  INTERVAL HISTORY: Holly Weaver returns today for follow-up of her estrogen receptor positive breast cancer accompanied by her daughter Holly Weaver. Holly Weaver is on long term letrozole since she never had definitive surgery. She tolerates it well, with no hot flashes or vaginal dryness. She just had a restaging mammogram which shows absolutely no change  REVIEW OF SYSTEMS: Holly Weaver goes out to eat with her daughter occasionally, gets visited by grandchildren, but otherwise "I don't do much". When asked what her worst problem is she thinks osteoporosis is her worst problem. She does not hurt anywhere, has had no fevers or bleeding, and since she started using a  walker has had no further falls. A detailed review of systems today was otherwise stable  PAST MEDICAL HISTORY: Past Medical History  Diagnosis Date  . Hypertension   . Osteoporosis   . Scoliosis   . Atrial fibrillation ([place])   . Compression fracture 09/21/2012  . Dyslipidemia 09/21/2012  . MVP (mitral valve prolapse) 09/23/2012  . Breast cancer ([place]) ~ 2006    "left" (10/09/2012)  . Heart murmur   . Shortness of breath     "just now; nose is dry" (10/09/2012)  . History of blood transfusion     "years and years ago; I was anemic" (10/09/2012)  . Cervical spondylolysis 09/21/2012  . Osteoarthritis 09/21/2012  . Arthritis     "hands and legs" (10/09/2012)  . Chronic lower back pain     PAST SURGICAL HISTORY: Past Surgical History  Procedure Laterality Date  . Femur im nail Left 09/21/2012    Procedure: INTRAMEDULLARY (IM) NAIL FEMORAL;  Surgeon: Newt Minion, MD;  Location: [facility];  Service: Orthopedics;  Laterality: Left;  . Hernia repair  1950's?  . Cataract extraction w/ intraocular lens implant Left 2000's  . Breast biopsy Left ~ 2006  . Cardiac catheterization  11/1998    FAMILY HISTORY Family History  Problem Relation Age of Onset  . Cancer Mother   . CAD Father   . Cancer Other    the patient's father died at the age of 86 from postoperative complications. The patient's mother died at the age of 83 with what sounds like lung cancer although the patient stated sinus cancer. The  patient had 2 brothers, no sisters. There is no history of breast or ovarian cancer in the immediate family.  GYNECOLOGIC HISTORY:  Menarche age 5, first live birth age 28, the patient is [place] P2. She went to the change of life in her 28s. Gender took hormone replacement.  SOCIAL HISTORY:  Holly Weaver used to works for [employer]. She is now retired. She is separated from her husband and he subsequently died approximately 7 years ago. The patient lives alone, with no pets, but her daughter Holly Weaver  is there just about every day. 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 The patient's son had a brain stem injury from a motorcycle accident and cannot walk. He is married and lives independently. The patient has 4 grandchildren and 5 great-grandchildren. She is a Tourist information centre manager    ADVANCED DIRECTIVES: In place; the patient's daughter Holly Weaver is healthcare power of attorney   HEALTH MAINTENANCE: Social History  Substance Use Topics  . Smoking status: Never Smoker   . Smokeless tobacco: Never Used  . Alcohol Use: No     Colonoscopy:  PAP:  Bone density:  Lipid panel:  Allergies  Allergen Reactions  . Codeine Nausea And Vomiting    Current Outpatient Prescriptions  Medication Sig Dispense Refill  . ALPRAZolam (XANAX) 0.25 MG tablet Take one tablet by mouth every 12 hours as needed for severe anxiety 60 tablet 5  . amiodarone (PACERONE) 200 MG tablet Take 200 mg by mouth every morning.     Marland Kitchen atorvastatin (LIPITOR) 40 MG tablet Take 40 mg by mouth every evening.     . calcium-vitamin D (OSCAL WITH D) 500-200 MG-UNIT per tablet Take 1 tablet by mouth every morning.     . denosumab (PROLIA) 60 MG/ML SOLN injection Inject 60 mg into the skin every 6 (six) months. Administer in upper arm, thigh, or abdomen    . ELIQUIS 2.5 MG TABS tablet Take 2.5 mg by mouth 2 (two) times daily.     . feeding supplement, ENSURE ENLIVE, (ENSURE ENLIVE) LIQD Take 237 mLs by mouth 2 (two) times daily between meals.    . furosemide (LASIX) 40 MG tablet Take 1 tablet (40 mg total) by mouth daily. 30 tablet 0  . letrozole (FEMARA) 2.5 MG tablet Take 1 tablet (2.5 mg total) by mouth daily. 90 tablet 4  . Multiple Vitamin (MULTIVITAMIN WITH MINERALS) TABS Take 1 tablet by mouth every morning. Centrum 50+    . olmesartan (BENICAR) 20 MG tablet Take 20 mg by mouth daily.    . polyethylene glycol (MIRALAX / GLYCOLAX) packet Take 17 g by mouth every morning.     . potassium chloride SA  (K-DUR,KLOR-CON) 20 MEQ tablet Take 20 mEq by mouth daily.     . promethazine (PHENERGAN) 12.5 MG tablet Take 1 tablet (12.5 mg total) by mouth every 8 (eight) hours as needed for nausea or vomiting. 15 tablet 0  . traMADol (ULTRAM) 50 MG tablet Take 1 tablet (50 mg total) by mouth every 8 (eight) hours as needed for pain. 50 tablet 0  . Vitamin D, Ergocalciferol, (DRISDOL) 50000 UNITS CAPS Take 50,000 Units by mouth every 7 (seven) days.     No current facility-administered medications for this visit.    OBJECTIVE: Elderly white woman using a chair walker Filed Vitals:   05/31/15 1350  BP: 159/42  Pulse: 71  Temp: 97.9 F (36.6 C)  Resp: 18     Body mass index  is 17.79 kg/(m^2).    ECOG FS:1 - Symptomatic but completely ambulatory  Sclerae unicteric, EOMs intact Oropharynx clear, slightly dry No cervical or supraclavicular adenopathy Lungs no rales or rhonchi Heart regular rate and rhythm Abd soft, nontender, positive bowel sounds MSK no focal spinal tenderness, no upper extremity lymphedema Neuro: nonfocal, well oriented, appropriate affect Breasts: the right breast is unremarkable. I do not palpate any masses in the left breast. The left axilla is benign    LAB RESULTS:  CMP     Component Value Date/Time   NA 138 09/12/2014 0516   NA 141 05/31/2014 1447   K 3.6 09/12/2014 0516   K 3.4* 05/31/2014 1447   CL 100* 09/12/2014 0516   CO2 30 09/12/2014 0516   CO2 30* 05/31/2014 1447   GLUCOSE 97 09/12/2014 0516   GLUCOSE 104 05/31/2014 1447   BUN 16 09/12/2014 0516   BUN 18.0 05/31/2014 1447   CREATININE 1.11* 09/13/2014 0435   CREATININE 1.0 05/31/2014 1447   CALCIUM 8.3* 09/12/2014 0516   CALCIUM 9.5 05/31/2014 1447   PROT 7.5 09/11/2014 0115   PROT 6.8 05/31/2014 1447   ALBUMIN 4.2 09/11/2014 0115   ALBUMIN 3.9 05/31/2014 1447   AST 31 09/11/2014 0115   AST 28 05/31/2014 1447   ALT 20 09/11/2014 0115   ALT 25 05/31/2014 1447   ALKPHOS 59 09/11/2014 0115    ALKPHOS 63 05/31/2014 1447   BILITOT 1.0 09/11/2014 0115   BILITOT 1.04 05/31/2014 1447   GFRNONAA 43* 09/13/2014 0435   GFRAA 50* 09/13/2014 0435    I No results found for: SPEP  Lab Results  Component Value Date   WBC 6.0 05/31/2015   NEUTROABS 4.3 05/31/2015   HGB 12.8 05/31/2015   HCT 39.2 05/31/2015   MCV 102.7* 05/31/2015   PLT 160 05/31/2015      Chemistry      Component Value Date/Time   NA 138 09/12/2014 0516   NA 141 05/31/2014 1447   K 3.6 09/12/2014 0516   K 3.4* 05/31/2014 1447   CL 100* 09/12/2014 0516   CO2 30 09/12/2014 0516   CO2 30* 05/31/2014 1447   BUN 16 09/12/2014 0516   BUN 18.0 05/31/2014 1447   CREATININE 1.11* 09/13/2014 0435   CREATININE 1.0 05/31/2014 1447      Component Value Date/Time   CALCIUM 8.3* 09/12/2014 0516   CALCIUM 9.5 05/31/2014 1447   ALKPHOS 59 09/11/2014 0115   ALKPHOS 63 05/31/2014 1447   AST 31 09/11/2014 0115   AST 28 05/31/2014 1447   ALT 20 09/11/2014 0115   ALT 25 05/31/2014 1447   BILITOT 1.0 09/11/2014 0115   BILITOT 1.04 05/31/2014 1447       Lab Results  Component Value Date   LABCA2 16 04/14/2010    No components found for: ONGEX528  No results for input(s): INR in the last 168 hours.  Urinalysis    Component Value Date/Time   COLORURINE AMBER* 09/11/2014 0111   APPEARANCEUR CLOUDY* 09/11/2014 0111   LABSPEC 1.021 09/11/2014 0111   PHURINE 5.5 09/11/2014 0111   GLUCOSEU NEGATIVE 09/11/2014 0111   HGBUR NEGATIVE 09/11/2014 0111   BILIRUBINUR SMALL* 09/11/2014 0111   KETONESUR NEGATIVE 09/11/2014 0111   PROTEINUR NEGATIVE 09/11/2014 0111   UROBILINOGEN 1.0 09/11/2014 0111   NITRITE NEGATIVE 09/11/2014 0111   LEUKOCYTESUR MODERATE* 09/11/2014 0111    STUDIES: Mm Diag Breast Tomo Bilateral  05/26/2015  CLINICAL DATA:  82 year old patient takes Femara for a biopsy-proven  malignancy the left breast at 1 o'clock position. This was diagnosed in February 2007. She has not and does not plan to  undergo surgery for the known left breast carcinoma. She presents with her daughter today. She has no current concerns. EXAM: 2D DIGITAL DIAGNOSTIC BILATERAL MAMMOGRAM WITH CAD AND ADJUNCT TOMO COMPARISON:  Previous exam(s). ACR Breast Density Category c: The breast tissue is heterogeneously dense, which may obscure small masses. FINDINGS: There is stable architectural distortion with associated ribbon shaped biopsy clip artifact in the posterior third of the outer left breast. The mammographic appearance of the biopsy-proven malignancy is unchanged compared to the most recent mammogram of April 2016. There is no evidence of progression of the malignancy over time. No new mass or new architectural distortion is identified in either breast. There are no suspicious microcalcifications. Heavy atherosclerotic vascular calcifications are noted. Mammographic images were processed with CAD. IMPRESSION: Stable appearance of biopsy-proven carcinoma in the outer left breast. No new suspicious areas identified in either breast. RECOMMENDATION: Diagnostic mammogram is suggested in 1 year. (Code:[id number]-B-01Y) I have discussed the findings and recommendations with the patient. Results were also provided in writing at the conclusion of the visit. If applicable, a reminder letter will be sent to the patient regarding the next appointment. BI-RADS CATEGORY  6: Known biopsy-proven malignancy. Electronically Signed   By: Curlene Dolphin M.D.   On: 05/26/2015 15:46     ASSESSMENT: 82 y.o. s/p left breast upper outer quadrant biopsy 03/07/2005 for a clinical T2 N0, stage IIA invasive ductal carcinoma, grade 2, estrogen receptor 95% positive, progesterone receptor 13% positive, HER-2 nonamplified  (1) the patient opted to forgo surgery and radiation   (2) on letrozole since March of 2007.  PLAN:  Aide is now 10 years out from her original left breast biopsy with no evidence of disease progression. This is very  favorable.  Because she never had a lumpectomy, the plan is to continue letrozole indefinitely.  We are taking care of stuporous his problems with Prolia, which she will receive this June, then this December, and then next June. The plan is to continue that as long as we continue on the letrozole.  She really hates mammography and is begging not to have it repeated next year. I think we can go to every other year mammogram and do just physical exam in between.  She has a big birthday coming up this August. The family is already planning for  They will call with any problems that may develop before her next visit with me which will be June 2018  [name], MD   05/31/2015 2:14 PM

## 2015-05-31 NOTE — Telephone Encounter (Signed)
appt made and avs printed °

## 2015-06-09 ENCOUNTER — Ambulatory Visit: Payer: Medicare Other | Admitting: Emergency Medicine

## 2015-06-23 ENCOUNTER — Other Ambulatory Visit: Payer: Self-pay | Admitting: *Deleted

## 2015-06-23 DIAGNOSIS — C50412 Malignant neoplasm of upper-outer quadrant of left female breast: Secondary | ICD-10-CM

## 2015-06-24 ENCOUNTER — Other Ambulatory Visit: Payer: Medicare Other

## 2015-06-24 ENCOUNTER — Telehealth: Payer: Self-pay | Admitting: Oncology

## 2015-06-24 ENCOUNTER — Encounter (HOSPITAL_COMMUNITY): Admission: RE | Admit: 2015-06-24 | Payer: Medicare Other | Source: Ambulatory Visit

## 2015-06-24 ENCOUNTER — Ambulatory Visit: Payer: Medicare Other

## 2015-06-24 NOTE — Telephone Encounter (Signed)
Received call to cancel lab/inj for today. per pt 6/2. pt will get injection in [facility] short stay 6/7

## 2015-06-28 ENCOUNTER — Ambulatory Visit (HOSPITAL_COMMUNITY)
Admission: RE | Admit: 2015-06-28 | Discharge: 2015-06-28 | Disposition: A | Discharge status: Home / Self Care | Payer: Medicare Other | Source: Ambulatory Visit | Attending: Internal Medicine | Admitting: Internal Medicine

## 2015-06-28 ENCOUNTER — Encounter (HOSPITAL_COMMUNITY): Payer: Self-pay

## 2015-06-28 DIAGNOSIS — M810 Age-related osteoporosis without current pathological fracture: Secondary | ICD-10-CM | POA: Insufficient documentation

## 2015-06-28 MED ORDER — DENOSUMAB 60 MG/ML ~~LOC~~ SOLN
60.0000 mg | Freq: Once | SUBCUTANEOUS | Status: AC
  Administered 2015-06-28: 60 mg via SUBCUTANEOUS
  Filled 2015-06-28: qty 1

## 2015-06-28 NOTE — Progress Notes (Signed)
Pt confirms she takes daily calcium and vitamin D.  Pt states she has received Prolia in the past and done fine with it.  Prolia given SQ to rt upper arm.  Next appointment for Dec.8, 2017 1000 given to pt.

## 2015-07-09 ENCOUNTER — Other Ambulatory Visit: Payer: Self-pay | Admitting: Oncology

## 2015-07-12 ENCOUNTER — Other Ambulatory Visit: Payer: Self-pay | Admitting: *Deleted

## 2015-07-19 DIAGNOSIS — N189 Chronic kidney disease, unspecified: Secondary | ICD-10-CM | POA: Diagnosis not present

## 2015-07-19 DIAGNOSIS — L299 Pruritus, unspecified: Secondary | ICD-10-CM | POA: Diagnosis not present

## 2015-07-19 DIAGNOSIS — Z6820 Body mass index (BMI) 20.0-20.9, adult: Secondary | ICD-10-CM | POA: Diagnosis not present

## 2015-08-30 ENCOUNTER — Ambulatory Visit (INDEPENDENT_AMBULATORY_CARE_PROVIDER_SITE_OTHER): Payer: Medicare Other | Admitting: Emergency Medicine

## 2015-08-30 ENCOUNTER — Encounter: Payer: Self-pay | Admitting: Emergency Medicine

## 2015-08-30 DIAGNOSIS — J479 Bronchiectasis, uncomplicated: Secondary | ICD-10-CM

## 2015-08-30 DIAGNOSIS — J9811 Atelectasis: Secondary | ICD-10-CM | POA: Diagnosis not present

## 2015-08-30 DIAGNOSIS — R0609 Other forms of dyspnea: Secondary | ICD-10-CM | POA: Diagnosis not present

## 2015-08-30 DIAGNOSIS — I2782 Chronic pulmonary embolism: Secondary | ICD-10-CM

## 2015-08-30 NOTE — Patient Instructions (Addendum)
We will continue Breo 1 inhalation once a day.  Continue your other medications as  You are taking them We will not rescheduled breathing testing for now.  Follow with Dr Lamonte Sakai in 6 months or sooner if you have any problems

## 2015-08-30 NOTE — Assessment & Plan Note (Signed)
Suspect this is primarily restrictive disease although she does feel that she'll benefit some from [facility]. She was unable to perform pulmonary function testing today. Based on her clinical history we will continue breo for now.

## 2015-08-30 NOTE — Progress Notes (Signed)
Subjective:    Patient ID: Holly Weaver, female    DOB: Jul 29, 1923, 82 y.o.   MRN: [id number]  HPI 82 year old never smoker with a history of atrial fibrillation, left breast cancer, hypertension with chronic diastolic CHF. She tells that she was diagnosed with a right pulmonary artery pulmonary embolism in 09/11/14 prompted by severe pleuritic pain. There was RLL infiltrate vs infarct on that scan. When she exerts herself she feels discomfort at her lung bases, she describes as a "heaviness".  She also has some evidence for bronchiectatic change in her basilar airways on that scan. She referred for further evaluation of her dyspnea and also her abnormal CT scan.  She has occasional cough w allergies, no significant mucous production. She was seen by Dr Brigitte Pulse 10/16 and started on Breo, not really reliable with it.  Not sure she has seen any change on it.    ROV 08/30/15 -- Follow-up visit for patient with a history of a fibrillation diastolic CHF and prior pulmonary embolism by CT scan 09/11/14 for which she was treated with Eliquis 2.5mg  bid. I saw her in March Dyspnea that had symptoms suggestive of restrictive disease and inability take deep breath. She had been tried empirically on Breo to see if she would benefit. She underwent CT scan of the chest on 04/12/15 that personally reviewed. This showed Stable right upper lobe scar, left lower lobe bronchiectasis and mild medial left base atelectasis that are improved compared with her prior. She has right lower lobe bronchiectasis And some consolidation of the medial right lung base. She was unable to perform pulmonary function testing today. She is still taking Breo, feels that it does help her clear congestion. Feels that her breathing is a bit better now compared with last visit.      Review of Systems  Constitutional: Negative for fever and unexpected weight change.  HENT: Negative for congestion, dental problem, ear pain, nosebleeds, postnasal  drip, rhinorrhea, sinus pressure, sneezing, sore throat and trouble swallowing.   Eyes: Negative for itching.  Respiratory: Positive for cough. Negative for chest tightness, shortness of breath and wheezing.   Cardiovascular: Positive for leg swelling. Negative for palpitations.  Gastrointestinal: Negative for nausea and vomiting.  Genitourinary: Negative for dysuria.  Musculoskeletal: Negative for joint swelling.  Skin: Negative for rash.  Neurological: Negative for headaches.  Hematological: Does not bruise/bleed easily.  Psychiatric/Behavioral: Negative for dysphoric mood. The patient is not nervous/anxious.       Objective:   Physical Exam  Vitals:   08/30/15 1410  BP: 120/62  Pulse: 64  SpO2: 96%  Weight: 101 lb (45.8 kg)  Height: 5\' 4"  (1.626 m)   Gen: Pleasant, well-nourished, in no distress,  normal affect  ENT: No lesions,  mouth clear,  oropharynx clear, no postnasal drip  Neck: No JVD, no TMG, no carotid bruits  Lungs: No use of accessory muscles, no dullness to percussion, clear without rales or rhonchi  Cardiovascular: RRR, heart sounds normal, no murmur or gallops, no peripheral edema  Musculoskeletal: No deformities, no cyanosis or clubbing  Neuro: alert, non focal  Skin: Warm, no lesions or rashes  04/12/15 --  COMPARISON:  09/13/2014, 09/11/2014  FINDINGS: Mediastinum/Nodes: A known left thyroid nodule appears stable given limited evaluation without contrast. There is no significant hilar or mediastinal adenopathy. Calcification of the aorta, mitral valve, aortic root, and coronary arteries stable. No pericardial effusion.  There is a moderate hiatal hernia.  There is uncoiling of the aorta.  Lungs/Pleura: No pleural effusion. Stable mild right upper lobe scarring seen on series 3 image 13 through 15. Subpleural fibrotic change lateral inferior right middle lobe and lateral inferior right lower lobe similar to prior study. Previous  consolidation medial right lung base again identified, but less extensive. Right lower lobe bronchiectasis again identified, slightly progressed when compared to the prior study particularly involving the bronchus extending into the consolidated lung seen best on image 43 and 44.  On the left, the upper lobe is clear. There is lower lobe bronchiectasis and mild medial left base atelectasis, improved when compared to the prior study.  Upper abdomen: Hyper attenuation of the liver suggesting iron deposition.Primary or secondary hemochromatosis or chronic amiodarone use are considerations.  Musculoskeletal: Status post T12 and L1 kyphoplasty, stable. Severe T10 and T11 compression deformities are stable. Moderate T6 compression deformity stable.  IMPRESSION: Persistent bilateral lower lobe bronchiectasis. Bibasilar atelectasis right greater than left, improved when compared to the prior study.  Stable small focus of right upper lobe scarring and stable scarring anterior lateral right lung base.  Other nonacute findings described above      Assessment & Plan:  Dyspnea on exertion Suspect this is primarily restrictive disease although she does feel that she'll benefit some from [facility]. She was unable to perform pulmonary function testing today. Based on her clinical history we will continue breo for now.   Chronic pulmonary embolism (HCC) Being maintained on low-dose Eliquis  Atelectasis Right lower lobe posterior area of scarring. Unclear as to whether this was related to her chronic remote pulmonary embolism. She is at risk for chronic aspiration and has been told that she had dysphagia in the past. We discussed swallowing precautions today. If she develops a recurrent right lower lobe pneumonia then we could consider a formal swallowing evaluation and speech therapy instruction.  Baltazar Apo, MD, PhD 08/30/2015, 2:47 PM [facility] [phone] or if no  answer [phone]

## 2015-08-30 NOTE — Assessment & Plan Note (Signed)
Being maintained on low-dose Eliquis

## 2015-08-30 NOTE — Assessment & Plan Note (Signed)
Right lower lobe posterior area of scarring. Unclear as to whether this was related to her chronic remote pulmonary embolism. She is at risk for chronic aspiration and has been told that she had dysphagia in the past. We discussed swallowing precautions today. If she develops a recurrent right lower lobe pneumonia then we could consider a formal swallowing evaluation and speech therapy instruction.

## 2015-09-02 DIAGNOSIS — E784 Other hyperlipidemia: Secondary | ICD-10-CM | POA: Diagnosis not present

## 2015-09-02 DIAGNOSIS — Z1389 Encounter for screening for other disorder: Secondary | ICD-10-CM | POA: Diagnosis not present

## 2015-09-02 DIAGNOSIS — Z7901 Long term (current) use of anticoagulants: Secondary | ICD-10-CM | POA: Diagnosis not present

## 2015-09-02 DIAGNOSIS — C50919 Malignant neoplasm of unspecified site of unspecified female breast: Secondary | ICD-10-CM | POA: Diagnosis not present

## 2015-09-02 DIAGNOSIS — Z6820 Body mass index (BMI) 20.0-20.9, adult: Secondary | ICD-10-CM | POA: Diagnosis not present

## 2015-09-02 DIAGNOSIS — I10 Essential (primary) hypertension: Secondary | ICD-10-CM | POA: Diagnosis not present

## 2015-09-02 DIAGNOSIS — M810 Age-related osteoporosis without current pathological fracture: Secondary | ICD-10-CM | POA: Diagnosis not present

## 2015-09-02 DIAGNOSIS — I480 Paroxysmal atrial fibrillation: Secondary | ICD-10-CM | POA: Diagnosis not present

## 2015-09-02 DIAGNOSIS — R7301 Impaired fasting glucose: Secondary | ICD-10-CM | POA: Diagnosis not present

## 2015-09-02 DIAGNOSIS — Z86711 Personal history of pulmonary embolism: Secondary | ICD-10-CM | POA: Diagnosis not present

## 2015-11-19 DIAGNOSIS — Z23 Encounter for immunization: Secondary | ICD-10-CM | POA: Diagnosis not present

## 2015-12-30 ENCOUNTER — Other Ambulatory Visit: Payer: Self-pay | Admitting: Oncology

## 2015-12-30 ENCOUNTER — Other Ambulatory Visit: Payer: Medicare Other

## 2015-12-30 ENCOUNTER — Ambulatory Visit: Payer: Medicare Other

## 2015-12-30 ENCOUNTER — Ambulatory Visit (HOSPITAL_COMMUNITY)
Admission: RE | Admit: 2015-12-30 | Discharge: 2015-12-30 | Disposition: A | Discharge status: Home / Self Care | Payer: Medicare Other | Source: Ambulatory Visit | Attending: Internal Medicine | Admitting: Internal Medicine

## 2015-12-30 ENCOUNTER — Encounter (HOSPITAL_COMMUNITY): Payer: Self-pay

## 2015-12-30 DIAGNOSIS — M8580 Other specified disorders of bone density and structure, unspecified site: Secondary | ICD-10-CM

## 2015-12-30 DIAGNOSIS — M810 Age-related osteoporosis without current pathological fracture: Secondary | ICD-10-CM | POA: Diagnosis not present

## 2015-12-30 MED ORDER — DENOSUMAB 60 MG/ML ~~LOC~~ SOLN
60.0000 mg | Freq: Once | SUBCUTANEOUS | Status: AC
Start: 2015-12-30 — End: 2015-12-30
  Administered 2015-12-30: 60 mg via SUBCUTANEOUS
  Filled 2015-12-30: qty 1

## 2015-12-30 NOTE — Discharge Instructions (Signed)
PROLIA Denosumab injection What is this medicine? DENOSUMAB (den oh sue mab) slows bone breakdown. Prolia is used to treat osteoporosis in women after menopause and in men. Delton See is used to prevent bone fractures and other bone problems caused by cancer bone metastases. Delton See is also used to treat giant cell tumor of the bone. COMMON BRAND NAME(S): Prolia, XGEVA What should I tell my health care provider before I take this medicine? They need to know if you have any of these conditions: -dental disease -eczema -infection or history of infections -kidney disease or on dialysis -low blood calcium or vitamin D -malabsorption syndrome -scheduled to have surgery or tooth extraction -taking medicine that contains denosumab -thyroid or parathyroid disease -an unusual reaction to denosumab, other medicines, foods, dyes, or preservatives -pregnant or trying to get pregnant -breast-feeding How should I use this medicine? This medicine is for injection under the skin. It is given by a health care professional in a hospital or clinic setting. If you are getting Prolia, a special MedGuide will be given to you by the pharmacist with each prescription and refill. Be sure to read this information carefully each time. For Prolia, talk to your pediatrician regarding the use of this medicine in children. Special care may be needed. For Delton See, talk to your pediatrician regarding the use of this medicine in children. While this drug may be prescribed for children as young as 13 years for selected conditions, precautions do apply. What if I miss a dose? It is important not to miss your dose. Call your doctor or health care professional if you are unable to keep an appointment. What may interact with this medicine? Do not take this medicine with any of the following medications: -other medicines containing denosumab This medicine may also interact with the following medications: -medicines that suppress the  immune system -medicines that treat cancer -steroid medicines like prednisone or cortisone What should I watch for while using this medicine? Visit your doctor or health care professional for regular checks on your progress. Your doctor or health care professional may order blood tests and other tests to see how you are doing. Call your doctor or health care professional if you get a cold or other infection while receiving this medicine. Do not treat yourself. This medicine may decrease your body's ability to fight infection. You should make sure you get enough calcium and vitamin D while you are taking this medicine, unless your doctor tells you not to. Discuss the foods you eat and the vitamins you take with your health care professional. See your dentist regularly. Brush and floss your teeth as directed. Before you have any dental work done, tell your dentist you are receiving this medicine. Do not become pregnant while taking this medicine or for 5 months after stopping it. Women should inform their doctor if they wish to become pregnant or think they might be pregnant. There is a potential for serious side effects to an unborn child. Talk to your health care professional or pharmacist for more information. What side effects may I notice from receiving this medicine? Side effects that you should report to your doctor or health care professional as soon as possible: -allergic reactions like skin rash, itching or hives, swelling of the face, lips, or tongue -breathing problems -chest pain -fast, irregular heartbeat -feeling faint or lightheaded, falls -fever, chills, or any other sign of infection -muscle spasms, tightening, or twitches -numbness or tingling -skin blisters or bumps, or is dry, peels, or red -  slow healing or unexplained pain in the mouth or jaw -unusual bleeding or bruising Side effects that usually do not require medical attention (report to your doctor or health care  professional if they continue or are bothersome): -muscle pain -stomach upset, gas Where should I keep my medicine? This medicine is only given in a clinic, doctor's office, or other health care setting and will not be stored at home.  2017 Elsevier/Gold Standard (2015-02-10 10:06:55)  Osteoporosis Osteoporosis is the thinning and loss of density in the bones. Osteoporosis makes the bones more brittle, fragile, and likely to break (fracture). Over time, osteoporosis can cause the bones to become so weak that they fracture after a simple fall. The bones most likely to fracture are the bones in the hip, wrist, and spine. What are the causes? The exact cause is not known. What increases the risk? Anyone can develop osteoporosis. You may be at greater risk if you have a family history of the condition or have poor nutrition. You may also have a higher risk if you are:  Female.  16 years old or older.  A smoker.  Not physically active.  White or Asian.  Slender. What are the signs or symptoms? A fracture might be the first sign of the disease, especially if it results from a fall or injury that would not usually cause a bone to break. Other signs and symptoms include:  Low back and neck pain.  Stooped posture.  Height loss. How is this diagnosed? To make a diagnosis, your health care provider may:  Take a medical history.  Perform a physical exam.  Order tests, such as:  A bone mineral density test.  A dual-energy X-ray absorptiometry test. How is this treated? The goal of osteoporosis treatment is to strengthen your bones to reduce your risk of a fracture. Treatment may involve:  Making lifestyle changes, such as:  Eating a diet rich in calcium.  Doing weight-bearing and muscle-strengthening exercises.  Stopping tobacco use.  Limiting alcohol intake.  Taking medicine to slow the process of bone loss or to increase bone density.  Monitoring your levels of  calcium and vitamin D. Follow these instructions at home:  Include calcium and vitamin D in your diet. Calcium is important for bone health, and vitamin D helps the body absorb calcium.  Perform weight-bearing and muscle-strengthening exercises as directed by your health care provider.  Do not use any tobacco products, including cigarettes, chewing tobacco, and electronic cigarettes. If you need help quitting, ask your health care provider.  Limit your alcohol intake.  Take medicines only as directed by your health care provider.  Keep all follow-up visits as directed by your health care provider. This is important.  Take precautions at home to lower your risk of falling, such as:  Keeping rooms well lit and clutter free.  Installing safety rails on stairs.  Using rubber mats in the bathroom and other areas that are often wet or slippery. Get help right away if: You fall or injure yourself. This information is not intended to replace advice given to you by your health care provider. Make sure you discuss any questions you have with your health care provider. Document Released: 10/18/2004 Document Revised: 06/13/2015 Document Reviewed: 06/18/2013 Elsevier Interactive Patient Education  2017 [employer].

## 2016-01-28 ENCOUNTER — Emergency Department (HOSPITAL_COMMUNITY)
Admission: EM | Admit: 2016-01-28 | Discharge: 2016-01-28 | Disposition: A | Discharge status: Home / Self Care | Payer: Medicare Other | Attending: Emergency Medicine | Admitting: Emergency Medicine

## 2016-01-28 DIAGNOSIS — T148XXA Other injury of unspecified body region, initial encounter: Secondary | ICD-10-CM | POA: Diagnosis not present

## 2016-01-28 DIAGNOSIS — Y999 Unspecified external cause status: Secondary | ICD-10-CM | POA: Insufficient documentation

## 2016-01-28 DIAGNOSIS — S81812A Laceration without foreign body, left lower leg, initial encounter: Secondary | ICD-10-CM | POA: Diagnosis not present

## 2016-01-28 DIAGNOSIS — W228XXA Striking against or struck by other objects, initial encounter: Secondary | ICD-10-CM | POA: Diagnosis not present

## 2016-01-28 DIAGNOSIS — Y939 Activity, unspecified: Secondary | ICD-10-CM | POA: Insufficient documentation

## 2016-01-28 DIAGNOSIS — T07XXXA Unspecified multiple injuries, initial encounter: Secondary | ICD-10-CM | POA: Diagnosis not present

## 2016-01-28 DIAGNOSIS — S81819A Laceration without foreign body, unspecified lower leg, initial encounter: Secondary | ICD-10-CM | POA: Diagnosis present

## 2016-01-28 DIAGNOSIS — Z853 Personal history of malignant neoplasm of breast: Secondary | ICD-10-CM | POA: Insufficient documentation

## 2016-01-28 DIAGNOSIS — I2510 Atherosclerotic heart disease of native coronary artery without angina pectoris: Secondary | ICD-10-CM | POA: Insufficient documentation

## 2016-01-28 DIAGNOSIS — Y929 Unspecified place or not applicable: Secondary | ICD-10-CM | POA: Diagnosis not present

## 2016-01-28 DIAGNOSIS — Z7901 Long term (current) use of anticoagulants: Secondary | ICD-10-CM | POA: Insufficient documentation

## 2016-01-28 DIAGNOSIS — I10 Essential (primary) hypertension: Secondary | ICD-10-CM | POA: Insufficient documentation

## 2016-01-28 MED ORDER — TETANUS-DIPHTH-ACELL PERTUSSIS 5-2.5-18.5 LF-MCG/0.5 IM SUSP
0.5000 mL | Freq: Once | INTRAMUSCULAR | Status: AC
  Administered 2016-01-28: 0.5 mL via INTRAMUSCULAR
  Filled 2016-01-28: qty 0.5

## 2016-01-28 MED ORDER — CEPHALEXIN 500 MG PO CAPS: 500.0000 mg | ORAL_CAPSULE | Freq: Two times a day (BID) | ORAL | 0 refills | Status: DC

## 2016-01-28 MED ORDER — CEPHALEXIN 500 MG PO CAPS: 500.0000 mg | ORAL_CAPSULE | Freq: Three times a day (TID) | ORAL | 0 refills | Status: DC

## 2016-01-28 NOTE — ED Notes (Signed)
Assisted patient getting out of bed, ambulating to door and back to stretcher. Informed Dr. Tamera Punt of patients condition with no bleeding of bandage.

## 2016-01-28 NOTE — ED Provider Notes (Signed)
[facility] DEPT Provider Note   CSN: [id number] Arrival date & time: 01/28/16  1404     History   Chief Complaint Chief Complaint  Patient presents with  . Extremity Laceration    HPI Holly Weaver is a 83 y.o. female.  Patient is a 83 year old female who presents with a lower leg wound. She states that 2 days ago she scraped it on her rolling walker and sustained a skin tear. She states it has been bleeding intermittently since that time. When she stood up today it started bleeding heavier and she came in to have an evaluation. She denies any other injuries. She is currently on Eliquis for atrial fibrillation and pulmonary embolus. She's not sure when her last tetanus shot was.      Past Medical History:  Diagnosis Date  . Arthritis    "hands and legs" (10/09/2012)  . Atrial fibrillation ([place])   . Breast cancer ([place]) ~ 2006   "left" (10/09/2012)  . Cervical spondylolysis 09/21/2012  . Chronic lower back pain   . Compression fracture 09/21/2012  . Dyslipidemia 09/21/2012  . Heart murmur   . History of blood transfusion    "years and years ago; I was anemic" (10/09/2012)  . Hypertension   . MVP (mitral valve prolapse) 09/23/2012  . Osteoarthritis 09/21/2012  . Osteoporosis   . Scoliosis   . Shortness of breath    "just now; nose is dry" (10/09/2012)    Patient Active Problem List   Diagnosis Date Noted  . Osteopenia 12/30/2015  . Atelectasis 08/30/2015  . Dyspnea on exertion 04/04/2015  . Chronic pulmonary embolism ([place]) 04/04/2015  . Acute respiratory failure with hypoxia ([place]) 09/14/2014  . Lobar pneumonia due to unspecified organism 09/14/2014  . Protein-calorie malnutrition, severe ([place]) 09/14/2014  . Pressure ulcer 09/11/2014  . Breast cancer of upper-outer quadrant of left female breast ([place]) 05/26/2013  . CAD in native artery, non obstructive by cath 2000 09/23/2012  . Dyslipidemia 09/21/2012  . Hypertension 09/21/2012  . PAF (paroxysmal atrial  fibrillation), maintaining SR 05/10/2012  . Long term current use of anticoagulant therapy 05/10/2012    Past Surgical History:  Procedure Laterality Date  . BREAST BIOPSY Left ~ 2006  . CARDIAC CATHETERIZATION  11/1998  . CATARACT EXTRACTION W/ INTRAOCULAR LENS IMPLANT Left 2000's  . FEMUR IM NAIL Left 09/21/2012   Procedure: INTRAMEDULLARY (IM) NAIL FEMORAL;  Surgeon: Newt Minion, MD;  Location: [facility] ORS;  Service: Orthopedics;  Laterality: Left;  . HERNIA REPAIR  1950's?    OB History    No data available       Home Medications    Prior to Admission medications   Medication Sig Start Date End Date Taking? Authorizing Provider  ALPRAZolam Duanne Moron) 0.25 MG tablet Take one tablet by mouth every 12 hours as needed for severe anxiety Patient taking differently: Take 0.25 mg by mouth 2 (two) times daily as needed for anxiety. Take one tablet by mouth every 12 hours as needed for severe anxiety 09/30/12  Yes Lauree Chandler, NP  amiodarone (PACERONE) 200 MG tablet Take 200 mg by mouth every morning.  12/06/11  Yes Kristin L Alvstad, RPH-CPP  azithromycin (ZITHROMAX) 250 MG tablet Take 250 mg by mouth daily. 5 day course 01/24/16  Yes Historical Provider, MD  calcium-vitamin D (OSCAL WITH D) 500-200 MG-UNIT per tablet Take 1 tablet by mouth every morning.    Yes Historical Provider, MD  denosumab (PROLIA) 60 MG/ML SOLN injection Inject 60 mg  into the skin every 6 (six) months. Administer in upper arm, thigh, or abdomen   Yes Historical Provider, MD  ELIQUIS 2.5 MG TABS tablet Take 2.5 mg by mouth 2 (two) times daily.  03/10/13  Yes Historical Provider, MD  feeding supplement, ENSURE ENLIVE, (ENSURE ENLIVE) LIQD Take 237 mLs by mouth 2 (two) times daily between meals. 09/15/14  Yes Modena Jansky, MD  Fluticasone Furoate-Vilanterol (BREO ELLIPTA IN) Inhale 1 puff into the lungs daily.   Yes Historical Provider, MD  furosemide (LASIX) 40 MG tablet Take 1 tablet (40 mg total) by mouth daily.  10/14/12  Yes [name], MD  letrozole [facility]) 2.5 MG tablet Take 1 tablet (2.5 mg total) by mouth daily. 05/31/15  Yes [name], MD  Multiple Vitamin (MULTIVITAMIN WITH MINERALS) TABS Take 1 tablet by mouth every morning. Centrum 50+   Yes Historical Provider, MD  olmesartan (BENICAR) 20 MG tablet Take 20 mg by mouth daily.   Yes Historical Provider, MD  ondansetron (ZOFRAN) 4 MG tablet Take 4 mg by mouth every 8 (eight) hours as needed for nausea or vomiting.   Yes Historical Provider, MD  polyethylene glycol (MIRALAX / GLYCOLAX) packet Take 17 g by mouth daily as needed for mild constipation or moderate constipation.    Yes Historical Provider, MD  potassium chloride SA (K-DUR,KLOR-CON) 20 MEQ tablet Take 20 mEq by mouth daily.  04/13/13  Yes Historical Provider, MD  rOPINIRole (REQUIP) 0.25 MG tablet Take 0.25 mg by mouth at bedtime. 12/17/15  Yes Historical Provider, MD  traMADol (ULTRAM) 50 MG tablet Take 1 tablet (50 mg total) by mouth every 8 (eight) hours as needed for pain. Patient taking differently: Take 50 mg by mouth every 8 (eight) hours as needed for moderate pain or severe pain.  09/24/12  Yes Orson Eva, MD  Vitamin D, Ergocalciferol, (DRISDOL) 50000 UNITS CAPS Take 50,000 Units by mouth every 7 (seven) days.   Yes Historical Provider, MD  cephALEXin (KEFLEX) 500 MG capsule Take 1 capsule (500 mg total) by mouth 2 (two) times daily. 01/28/16   Malvin Johns, MD  letrozole [facility]) 2.5 MG tablet TAKE 1 TABLET BY MOUTH EVERY DAY Patient not taking: Reported on 01/28/2016 07/12/15   [name], MD    Family History Family History  Problem Relation Age of Onset  . Cancer Mother   . CAD Father   . Cancer Other     Social History Social History  Substance Use Topics  . Smoking status: Never Smoker  . Smokeless tobacco: Never Used  . Alcohol use No     Allergies   Codeine   Review of Systems Review of Systems  Constitutional: Negative for fever.    Cardiovascular: Positive for leg swelling.  Gastrointestinal: Negative for nausea and vomiting.  Musculoskeletal: Negative for arthralgias, back pain and neck pain.  Skin: Positive for wound.  Neurological: Negative for weakness, numbness and headaches.     Physical Exam Updated Vital Signs BP 165/70 (BP Location: Right Arm)   Pulse 104   Temp 97.7 F (36.5 C) (Oral)   Resp 20   Ht 5\' 4"  (3.557 m)   Wt 107 lb (48.5 kg)   SpO2 100%   BMI 18.37 kg/m   Physical Exam  Constitutional: She is oriented to person, place, and time. She appears well-developed and well-nourished.  HENT:  Head: Normocephalic and atraumatic.  Neck: Normal range of motion. Neck supple.  Cardiovascular: Normal rate.   Pulmonary/Chest: Effort normal.  Musculoskeletal: She exhibits edema. She exhibits no tenderness.  Patient has a 2 cm skin tear to her left lower leg. There is a dressing in place with no bleeding through the dressing. Once the dressing is removed there is a small amount of constant oozing from the wound. There is some swelling distally. She has baseline swelling in both of her legs which is chronic. There is warmth and erythema to the foot and ankle. There is no streaking up the leg. No drainage from the wound. Pedal pulses are intact.  Neurological: She is alert and oriented to person, place, and time.  Skin: Skin is warm and dry.  Psychiatric: She has a normal mood and affect.     ED Treatments / Results  Labs (all labs ordered are listed, but only abnormal results are displayed) Labs Reviewed - No data to display  EKG  EKG Interpretation None       Radiology No results found.  Procedures Procedures (including critical care time)  Medications Ordered in ED Medications  Tdap (BOOSTRIX) injection 0.5 mL (0.5 mLs Intramuscular Given 01/28/16 1526)     Initial Impression / Assessment and Plan / ED Course  I have reviewed the triage vital signs and the nursing  notes.  Pertinent labs & imaging results that were available during my care of the patient were reviewed by me and considered in my medical decision making (see chart for details).  Clinical Course     Wound was irrigated by me with normal saline. A Surgifoam dressing was applied. The bleeding was controlled. Family was advised to keep the dressing on for 24 hours and then change it after that. They were advised to return if she has any ongoing bleeding. Her tetanus shot was updated.  Final Clinical Impressions(s) / ED Diagnoses   Final diagnoses:  Skin tear of left lower leg without complication, initial encounter    New Prescriptions Discharge Medication List as of 01/28/2016  4:27 PM       Malvin Johns, MD 01/28/16 2335

## 2016-01-28 NOTE — ED Triage Notes (Signed)
Pt states her left lower leg got caught on her walker yesterday when she went to sit down causing it to bleed. Pt states she is on Eliquis.

## 2016-03-02 DIAGNOSIS — Z6821 Body mass index (BMI) 21.0-21.9, adult: Secondary | ICD-10-CM | POA: Diagnosis not present

## 2016-03-02 DIAGNOSIS — N390 Urinary tract infection, site not specified: Secondary | ICD-10-CM | POA: Diagnosis not present

## 2016-03-02 DIAGNOSIS — Z0000 Encounter for general adult medical examination without abnormal findings: Secondary | ICD-10-CM | POA: Diagnosis not present

## 2016-03-02 DIAGNOSIS — I480 Paroxysmal atrial fibrillation: Secondary | ICD-10-CM | POA: Diagnosis not present

## 2016-03-02 DIAGNOSIS — Z7901 Long term (current) use of anticoagulants: Secondary | ICD-10-CM | POA: Diagnosis not present

## 2016-03-02 DIAGNOSIS — M810 Age-related osteoporosis without current pathological fracture: Secondary | ICD-10-CM | POA: Diagnosis not present

## 2016-03-02 DIAGNOSIS — N184 Chronic kidney disease, stage 4 (severe): Secondary | ICD-10-CM | POA: Diagnosis not present

## 2016-03-02 DIAGNOSIS — I10 Essential (primary) hypertension: Secondary | ICD-10-CM | POA: Diagnosis not present

## 2016-03-02 DIAGNOSIS — Z7189 Other specified counseling: Secondary | ICD-10-CM | POA: Diagnosis not present

## 2016-03-02 DIAGNOSIS — E784 Other hyperlipidemia: Secondary | ICD-10-CM | POA: Diagnosis not present

## 2016-03-02 DIAGNOSIS — I2510 Atherosclerotic heart disease of native coronary artery without angina pectoris: Secondary | ICD-10-CM | POA: Diagnosis not present

## 2016-03-02 DIAGNOSIS — Z86711 Personal history of pulmonary embolism: Secondary | ICD-10-CM | POA: Diagnosis not present

## 2016-03-02 DIAGNOSIS — R7301 Impaired fasting glucose: Secondary | ICD-10-CM | POA: Diagnosis not present

## 2016-03-02 DIAGNOSIS — Z1389 Encounter for screening for other disorder: Secondary | ICD-10-CM | POA: Diagnosis not present

## 2016-04-24 NOTE — Progress Notes (Signed)
Pt unable to do test.

## 2016-06-27 ENCOUNTER — Other Ambulatory Visit: Payer: Self-pay

## 2016-06-27 DIAGNOSIS — C50412 Malignant neoplasm of upper-outer quadrant of left female breast: Secondary | ICD-10-CM

## 2016-06-28 ENCOUNTER — Other Ambulatory Visit (HOSPITAL_BASED_OUTPATIENT_CLINIC_OR_DEPARTMENT_OTHER): Payer: Medicare Other

## 2016-06-28 ENCOUNTER — Ambulatory Visit (HOSPITAL_BASED_OUTPATIENT_CLINIC_OR_DEPARTMENT_OTHER): Payer: Medicare Other | Admitting: Oncology

## 2016-06-28 VITALS — BP 179/89 | HR 90 | Temp 98.3°F | Resp 17 | Ht 64.0 in | Wt 104.7 lb

## 2016-06-28 DIAGNOSIS — Z853 Personal history of malignant neoplasm of breast: Secondary | ICD-10-CM

## 2016-06-28 DIAGNOSIS — C50412 Malignant neoplasm of upper-outer quadrant of left female breast: Secondary | ICD-10-CM

## 2016-06-28 DIAGNOSIS — M810 Age-related osteoporosis without current pathological fracture: Secondary | ICD-10-CM | POA: Diagnosis not present

## 2016-06-28 LAB — CBC WITH DIFFERENTIAL/PLATELET
BASO%: 1 % (ref 0.0–2.0)
BASOS ABS: 0 10*3/uL (ref 0.0–0.1)
EOS%: 1.1 % (ref 0.0–7.0)
Eosinophils Absolute: 0 10*3/uL (ref 0.0–0.5)
HCT: 40.3 % (ref 34.8–46.6)
HEMOGLOBIN: 13.4 g/dL (ref 11.6–15.9)
LYMPH%: 17.8 % (ref 14.0–49.7)
MCH: 34.1 pg — AB (ref 25.1–34.0)
MCHC: 33.1 g/dL (ref 31.5–36.0)
MCV: 103.1 fL — ABNORMAL HIGH (ref 79.5–101.0)
MONO#: 0.5 10*3/uL (ref 0.1–0.9)
MONO%: 11.9 % (ref 0.0–14.0)
NEUT%: 68.2 % (ref 38.4–76.8)
NEUTROS ABS: 3.1 10*3/uL (ref 1.5–6.5)
Platelets: 149 10*3/uL (ref 145–400)
RBC: 3.91 10*6/uL (ref 3.70–5.45)
RDW: 14.4 % (ref 11.2–14.5)
WBC: 4.5 10*3/uL (ref 3.9–10.3)
lymph#: 0.8 10*3/uL — ABNORMAL LOW (ref 0.9–3.3)

## 2016-06-28 LAB — COMPREHENSIVE METABOLIC PANEL
ALK PHOS: 61 U/L (ref 40–150)
ALT: 15 U/L (ref 0–55)
AST: 23 U/L (ref 5–34)
Albumin: 4.1 g/dL (ref 3.5–5.0)
Anion Gap: 9 mEq/L (ref 3–11)
BUN: 24.7 mg/dL (ref 7.0–26.0)
CO2: 31 meq/L — AB (ref 22–29)
Calcium: 10.1 mg/dL (ref 8.4–10.4)
Chloride: 100 mEq/L (ref 98–109)
Creatinine: 1.5 mg/dL — ABNORMAL HIGH (ref 0.6–1.1)
EGFR: 30 mL/min/{1.73_m2} — AB (ref >90)
GLUCOSE: 91 mg/dL (ref 70–140)
POTASSIUM: 4.6 meq/L (ref 3.5–5.1)
SODIUM: 141 meq/L (ref 136–145)
Total Bilirubin: 0.92 mg/dL (ref 0.20–1.20)
Total Protein: 6.8 g/dL (ref 6.4–8.3)

## 2016-06-28 NOTE — Progress Notes (Signed)
[place]  [phone]) [phone] [phone]) [phone]     ID: [name] OB: February 07, 1923  MR#: [id number]  EXN#:[id number]  PCP: [name], MD GYN:   SU:  OTHER MD: Meridee Score  CHIEF COMPLAINT:Estrogen receptor positive breast cancer  CURRENT TREATMENT: Letrozole  BREAST CANCER HISTORY: From Dr. Collier [name]'s intake note dated 03/28/2005  Ms. Kretz only had two mammograms in her lifetime.  She noted some change in her left breast, but only about a year ago.  She did undergo a mammogram on 02/16/2005, which showed a possible mass in the left breast.  Left diagnostic mammogram and ultrasound was performed on 03/07/2005, this showed a spiculated mass in the left breast.  Core biopsy was recommended.  Sonographically, this was a hypoechoic mass measuring 1.9 x 1.9 cm at the 2 o'clock position approximately 3.0 cm from the nipple.  Biopsy took place on 03/07/2005, which showed invasive mammary carcinoma intermediate grade.  This is strongly ER and PR positive 95% and 13% respectively.  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  The patient's subsequent history is as detailed below  INTERVAL HISTORY: Jonet returns today for follow-up of her estrogen receptor positive breast cancer accompanied by her daughter [name]. Logen continues on letrozole. She tolerates this well. Hot flashes and vaginal dryness are not a major issue. She never developed the arthralgias or myalgias that many patients can experience on this medication. She obtains it at a good price.  She had her most recent mammogram May of last year. She has begged to have mammography every 2 years because it is so painful for her so we are not obtaining 1 this year.  She receives denosumab/Xgeva through Dr. Raul Del office. She wondered if that was necessary.  REVIEW  OF SYSTEMS: She had her ninth 83 is birthday and enjoyed the celebration. She says she "can't walk" because of arthritis but but she means is that she has to have a walker and that she is limited in the walking she dies. There have been no intercurrent falls. A detailed review of systems today was otherwise stable  PAST MEDICAL HISTORY: Past Medical History:  Diagnosis Date  . Arthritis    "hands and legs" (10/09/2012)  . Atrial fibrillation ([place])   . Breast cancer ([place]) ~ 2006   "left" (10/09/2012)  . Cervical spondylolysis 09/21/2012  . Chronic lower back pain   . Compression fracture 09/21/2012  . Dyslipidemia 09/21/2012  . Heart murmur   . History of blood transfusion    "years and years ago; I was anemic" (10/09/2012)  . Hypertension   . MVP (mitral valve prolapse) 09/23/2012  . Osteoarthritis 09/21/2012  . Osteoporosis   . Scoliosis   . Shortness of breath    "just now; nose is dry" (10/09/2012)    PAST SURGICAL HISTORY: Past Surgical History:  Procedure Laterality Date  . BREAST BIOPSY Left ~ 2006  . CARDIAC CATHETERIZATION  11/1998  . CATARACT EXTRACTION W/ INTRAOCULAR LENS IMPLANT Left 2000's  . FEMUR IM NAIL Left 09/21/2012   Procedure: INTRAMEDULLARY (IM) NAIL FEMORAL;  Surgeon: Newt Minion, MD;  Location: [facility];  Service: Orthopedics;  Laterality: Left;  . HERNIA REPAIR  1950's?    FAMILY HISTORY Family History  Problem Relation Age of Onset  . Cancer Mother   . CAD Father   .  Cancer Other    the patient's father died at the age of 55 from postoperative complications. The patient's mother died at the age of 51 with what sounds like lung cancer although the patient stated sinus cancer. The patient had 2 brothers, no sisters. There is no history of breast or ovarian cancer in the immediate family.  GYNECOLOGIC HISTORY:  Menarche age 83, first live birth age 7, the patient is [place] P2. She went to the change of life in her 34s. Gender took hormone replacement.  SOCIAL  HISTORY:  Sarahlynn used to works for [employer]. She is now retired. She is separated from her husband and he subsequently died approximately 7 years ago. The patient lives alone, with no pets, but her daughter [name] is there just about every day. [name] used to work in [employer] underwriter but is now the caregiver primarily. The patient's son had a brain stem injury from a motorcycle accident and cannot walk. He is married and lives independently. The patient has 4 grandchildren and 5 great-grandchildren. She is a Tourist information centre manager    ADVANCED DIRECTIVES: In place; the patient's daughter [name] is healthcare power of attorney   HEALTH MAINTENANCE: Social History  Substance Use Topics  . Smoking status: Never Smoker  . Smokeless tobacco: Never Used  . Alcohol use No     Colonoscopy:  PAP:  Bone density:  Lipid panel:  Allergies  Allergen Reactions  . Codeine Nausea And Vomiting    Current Outpatient Prescriptions  Medication Sig Dispense Refill  . ALPRAZolam (XANAX) 0.25 MG tablet Take one tablet by mouth every 12 hours as needed for severe anxiety (Patient taking differently: Take 0.25 mg by mouth 2 (two) times daily as needed for anxiety. Take one tablet by mouth every 12 hours as needed for severe anxiety) 60 tablet 5  . amiodarone (PACERONE) 200 MG tablet Take 200 mg by mouth every morning.     . calcium-vitamin D (OSCAL WITH D) 500-200 MG-UNIT per tablet Take 1 tablet by mouth every morning.     . cephALEXin (KEFLEX) 500 MG capsule Take 1 capsule (500 mg total) by mouth 2 (two) times daily. 14 capsule 0  . denosumab (PROLIA) 60 MG/ML SOLN injection Inject 60 mg into the skin every 6 (six) months. Administer in upper arm, thigh, or abdomen    . ELIQUIS 2.5 MG TABS tablet Take 2.5 mg by mouth 2 (two) times daily.     . feeding supplement, ENSURE ENLIVE, (ENSURE ENLIVE) LIQD Take 237 mLs by mouth 2 (two) times daily between meals.    . Fluticasone Furoate-Vilanterol (BREO ELLIPTA IN)  Inhale 1 puff into the lungs daily.    . furosemide (LASIX) 40 MG tablet Take 1 tablet (40 mg total) by mouth daily. 30 tablet 0  . letrozole (FEMARA) 2.5 MG tablet Take 1 tablet (2.5 mg total) by mouth daily. 90 tablet 4  . Multiple Vitamin (MULTIVITAMIN WITH MINERALS) TABS Take 1 tablet by mouth every morning. Centrum 50+    . olmesartan (BENICAR) 20 MG tablet Take 20 mg by mouth daily.    . ondansetron (ZOFRAN) 4 MG tablet Take 4 mg by mouth every 8 (eight) hours as needed for nausea or vomiting.    . polyethylene glycol (MIRALAX / GLYCOLAX) packet Take 17 g by mouth daily as needed for mild constipation or moderate constipation.     . potassium chloride SA (K-DUR,KLOR-CON) 20 MEQ tablet Take 20 mEq by mouth daily.     Marland Kitchen rOPINIRole (  REQUIP) 0.25 MG tablet Take 0.25 mg by mouth at bedtime.  11  . traMADol (ULTRAM) 50 MG tablet Take 1 tablet (50 mg total) by mouth every 8 (eight) hours as needed for pain. (Patient taking differently: Take 50 mg by mouth every 8 (eight) hours as needed for moderate pain or severe pain. ) 50 tablet 0  . Vitamin D, Ergocalciferol, (DRISDOL) 50000 UNITS CAPS Take 50,000 Units by mouth every 7 (seven) days.     No current facility-administered medications for this visit.     OBJECTIVE: Older white woman using a walker Vitals:   06/28/16 1321  BP: (!) 179/89  Pulse: 90  Resp: 17  Temp: 98.3 F (36.8 C)     Body mass index is 17.97 kg/m.    ECOG FS:1 - Symptomatic but completely ambulatory  Sclerae unicteric, EOMs intact Oropharynx clear and moist No cervical or supraclavicular adenopathy Lungs no rales or rhonchi Heart regular rate and rhythm Abd soft, nontender, positive bowel sounds MSK no focal spinal tenderness, no upper extremity lymphedema Neuro: nonfocal, somewhat inappropriate laughter during parts of today's visit Breasts: The right breast is unremarkable. The left breast shows an altered contour with the nipple pointing downward and slightly to  the left. This is unchanged from baseline. There is no erythema. Both axillae are benign.   LAB RESULTS:  CMP     Component Value Date/Time   NA 141 06/28/2016 1306   K 4.6 06/28/2016 1306   CL 100 (L) 09/12/2014 0516   CO2 31 (H) 06/28/2016 1306   GLUCOSE 91 06/28/2016 1306   BUN 24.7 06/28/2016 1306   CREATININE 1.5 (H) 06/28/2016 1306   CALCIUM 10.1 06/28/2016 1306   PROT 6.8 06/28/2016 1306   ALBUMIN 4.1 06/28/2016 1306   AST 23 06/28/2016 1306   ALT 15 06/28/2016 1306   ALKPHOS 61 06/28/2016 1306   BILITOT 0.92 06/28/2016 1306   GFRNONAA 43 (L) 09/13/2014 0435   GFRAA 50 (L) 09/13/2014 0435    I No results found for: SPEP  Lab Results  Component Value Date   WBC 4.5 06/28/2016   NEUTROABS 3.1 06/28/2016   HGB 13.4 06/28/2016   HCT 40.3 06/28/2016   MCV 103.1 (H) 06/28/2016   PLT 149 06/28/2016      Chemistry      Component Value Date/Time   NA 141 06/28/2016 1306   K 4.6 06/28/2016 1306   CL 100 (L) 09/12/2014 0516   CO2 31 (H) 06/28/2016 1306   BUN 24.7 06/28/2016 1306   CREATININE 1.5 (H) 06/28/2016 1306      Component Value Date/Time   CALCIUM 10.1 06/28/2016 1306   ALKPHOS 61 06/28/2016 1306   AST 23 06/28/2016 1306   ALT 15 06/28/2016 1306   BILITOT 0.92 06/28/2016 1306       Lab Results  Component Value Date   LABCA2 16 04/14/2010    No components found for: [id number]  No results for input(s): INR in the last 168 hours.  Urinalysis    Component Value Date/Time   COLORURINE AMBER (A) 09/11/2014 0111   APPEARANCEUR CLOUDY (A) 09/11/2014 0111   LABSPEC 1.021 09/11/2014 0111   PHURINE 5.5 09/11/2014 0111   GLUCOSEU NEGATIVE 09/11/2014 0111   HGBUR NEGATIVE 09/11/2014 0111   BILIRUBINUR SMALL (A) 09/11/2014 0111   KETONESUR NEGATIVE 09/11/2014 0111   PROTEINUR NEGATIVE 09/11/2014 0111   UROBILINOGEN 1.0 09/11/2014 0111   NITRITE NEGATIVE 09/11/2014 0111   LEUKOCYTESUR MODERATE (A) 09/11/2014 0111  STUDIES: Mammography May  2017 was unchanged  ASSESSMENT: 83 y.o. s/p left breast upper outer quadrant biopsy 03/07/2005 for a clinical T2 N0, stage IIA invasive ductal carcinoma, grade 2, estrogen receptor 95% positive, progesterone receptor 13% positive, HER-2 nonamplified  (a) the patient opted to forgo surgery and radiation   (b) on letrozole since March of 2007.  (1) osteoporosis: on Prolia every 6 months  PLAN:  Ruthie is tolerating her letrozole well. She has severe osteoporosis and I strongly encouraged her to continue the Prolia she is receiving through Dr. Raul Del office.  I cautioned her again regarding fall precautions and encouraged her to use her walker at all times  She will see me again in one year. Before that visit she will have repeat mammography.  She knows to call for any other problems that may develop before that visit.   MAGRINAT,GUSTAV C 06/28/2016

## 2016-07-02 ENCOUNTER — Ambulatory Visit (HOSPITAL_COMMUNITY)
Admission: RE | Admit: 2016-07-02 | Discharge: 2016-07-02 | Disposition: A | Discharge status: Home / Self Care | Payer: Medicare Other | Source: Ambulatory Visit | Attending: Internal Medicine | Admitting: Internal Medicine

## 2016-07-22 ENCOUNTER — Encounter (HOSPITAL_COMMUNITY): Payer: Self-pay | Admitting: *Deleted

## 2016-07-22 ENCOUNTER — Emergency Department (HOSPITAL_COMMUNITY)
Admission: EM | Admit: 2016-07-22 | Discharge: 2016-07-22 | Disposition: A | Discharge status: Home / Self Care | Payer: Medicare Other | Attending: Emergency Medicine | Admitting: Emergency Medicine

## 2016-07-22 DIAGNOSIS — I2510 Atherosclerotic heart disease of native coronary artery without angina pectoris: Secondary | ICD-10-CM | POA: Diagnosis not present

## 2016-07-22 DIAGNOSIS — R0789 Other chest pain: Secondary | ICD-10-CM | POA: Diagnosis not present

## 2016-07-22 DIAGNOSIS — B029 Zoster without complications: Secondary | ICD-10-CM | POA: Diagnosis not present

## 2016-07-22 DIAGNOSIS — I10 Essential (primary) hypertension: Secondary | ICD-10-CM | POA: Insufficient documentation

## 2016-07-22 DIAGNOSIS — Z7901 Long term (current) use of anticoagulants: Secondary | ICD-10-CM | POA: Insufficient documentation

## 2016-07-22 DIAGNOSIS — Z79899 Other long term (current) drug therapy: Secondary | ICD-10-CM | POA: Insufficient documentation

## 2016-07-22 DIAGNOSIS — R21 Rash and other nonspecific skin eruption: Secondary | ICD-10-CM | POA: Diagnosis not present

## 2016-07-22 DIAGNOSIS — R109 Unspecified abdominal pain: Secondary | ICD-10-CM | POA: Diagnosis not present

## 2016-07-22 DIAGNOSIS — C50412 Malignant neoplasm of upper-outer quadrant of left female breast: Secondary | ICD-10-CM | POA: Diagnosis not present

## 2016-07-22 LAB — D-DIMER, QUANTITATIVE: D-Dimer, Quant: 0.32 ug/mL-FEU (ref 0.00–0.50)

## 2016-07-22 MED ORDER — DEXAMETHASONE SODIUM PHOSPHATE 10 MG/ML IJ SOLN
10.0000 mg | Freq: Once | INTRAMUSCULAR | Status: DC
Start: 2016-07-22 — End: 2016-07-22

## 2016-07-22 MED ORDER — ONDANSETRON 4 MG PO TBDP: 4.0000 mg | ORAL_TABLET | Freq: Three times a day (TID) | ORAL | 0 refills | Status: DC | PRN

## 2016-07-22 MED ORDER — FENTANYL CITRATE (PF) 100 MCG/2ML IJ SOLN
50.0000 ug | Freq: Once | INTRAMUSCULAR | Status: AC
  Administered 2016-07-22: 50 ug via INTRAVENOUS
  Filled 2016-07-22: qty 2

## 2016-07-22 MED ORDER — VALACYCLOVIR HCL 1 G PO TABS: 1000.0000 mg | ORAL_TABLET | Freq: Three times a day (TID) | ORAL | 0 refills | Status: DC

## 2016-07-22 MED ORDER — VALACYCLOVIR HCL 500 MG PO TABS
1000.0000 mg | ORAL_TABLET | Freq: Once | ORAL | Status: AC
  Administered 2016-07-22: 1000 mg via ORAL
  Filled 2016-07-22: qty 2

## 2016-07-22 MED ORDER — DEXAMETHASONE SODIUM PHOSPHATE 10 MG/ML IJ SOLN
10.0000 mg | Freq: Once | INTRAMUSCULAR | Status: AC
Start: 2016-07-22 — End: 2016-07-22
  Administered 2016-07-22: 10 mg via INTRAVENOUS
  Filled 2016-07-22: qty 1

## 2016-07-22 MED ORDER — HYDROCODONE-ACETAMINOPHEN 5-325 MG PO TABS: 1.0000 | ORAL_TABLET | ORAL | 0 refills | Status: DC | PRN

## 2016-07-22 MED ORDER — OXYCODONE-ACETAMINOPHEN 5-325 MG PO TABS: 2.0000 | ORAL_TABLET | ORAL | 0 refills | Status: DC | PRN

## 2016-07-22 NOTE — ED Triage Notes (Signed)
Pt bib EMS and presents with a rash on her left side near her last rib.  The rash is beginning to spread to her flank and back.  Rash began about 2 weeks ago.  Pt reports pain at the rash location.  Pt is here from home. Pt denies any other symptoms to EMS.   Hx Afib

## 2016-07-22 NOTE — ED Provider Notes (Signed)
[facility] DEPT Provider Note   CSN: [id number] Arrival date & time: 07/22/16  2122     History   Chief Complaint Chief Complaint  Patient presents with  . Herpes Zoster    HPI Holly Weaver is a 83 y.o. female.  Pt presents to the ED today with rash to her left chest wall that has been going on for 2 weeks.  Her daughter put calamine lotion on it.  Pt said she has some left sided chest pain before the rash started.  The pt is concerned about a blood clot.  The pt is on Eliquis for a.fib and hx of prior PE.  CHA2DS2/VAS Stroke Risk Points      5 >= 2 Points: High Risk  1 - 1.99 Points: Medium Risk  0 Points: Low Risk    The patient's score has not changed in the past year.:  No Change         Details    Note: External data might be a factor in metrics not marked with    Points Metrics   This score determines the patient's risk of having a stroke if the  patient has atrial fibrillation.       0 Has Congestive Heart Failure:  No   1 Has Vascular Disease:  Yes   1 Has Hypertension:  Yes   2 Age:  66   0 Has Diabetes:  No   0 Had Stroke:  No Had TIA:  No Had thromboembolism:  No   1 Female:  Yes             Past Medical History:  Diagnosis Date  . Arthritis    "hands and legs" (10/09/2012)  . Atrial fibrillation ([place])   . Breast cancer ([place]) ~ 2006   "left" (10/09/2012)  . Cervical spondylolysis 09/21/2012  . Chronic lower back pain   . Compression fracture 09/21/2012  . Dyslipidemia 09/21/2012  . Heart murmur   . History of blood transfusion    "years and years ago; I was anemic" (10/09/2012)  . Hypertension   . MVP (mitral valve prolapse) 09/23/2012  . Osteoarthritis 09/21/2012  . Osteoporosis   . Scoliosis   . Shortness of breath    "just now; nose is dry" (10/09/2012)    Patient Active Problem List   Diagnosis Date Noted  . Osteopenia 12/30/2015  . Atelectasis 08/30/2015  . Dyspnea on exertion 04/04/2015  . Chronic pulmonary embolism ([place])  04/04/2015  . Acute respiratory failure with hypoxia ([place]) 09/14/2014  . Lobar pneumonia due to unspecified organism 09/14/2014  . Protein-calorie malnutrition, severe ([place]) 09/14/2014  . Pressure ulcer 09/11/2014  . Breast cancer of upper-outer quadrant of left female breast ([place]) 05/26/2013  . CAD in native artery, non obstructive by cath 2000 09/23/2012  . Dyslipidemia 09/21/2012  . Hypertension 09/21/2012  . PAF (paroxysmal atrial fibrillation), maintaining SR 05/10/2012  . Long term current use of anticoagulant therapy 05/10/2012    Past Surgical History:  Procedure Laterality Date  . BREAST BIOPSY Left ~ 2006  . CARDIAC CATHETERIZATION  11/1998  . CATARACT EXTRACTION W/ INTRAOCULAR LENS IMPLANT Left 2000's  . FEMUR IM NAIL Left 09/21/2012   Procedure: INTRAMEDULLARY (IM) NAIL FEMORAL;  Surgeon: Newt Minion, MD;  Location: [facility];  Service: Orthopedics;  Laterality: Left;  . HERNIA REPAIR  1950's?    OB History    No data available       Home Medications    Prior to  Admission medications   Medication Sig Start Date End Date Taking? Authorizing Provider  ALPRAZolam Duanne Moron) 0.25 MG tablet Take one tablet by mouth every 12 hours as needed for severe anxiety Patient taking differently: Take 0.25 mg by mouth 2 (two) times daily as needed for anxiety. Take one tablet by mouth every 12 hours as needed for severe anxiety 09/30/12   Lauree Chandler, NP  amiodarone (PACERONE) 200 MG tablet Take 200 mg by mouth every morning.  12/06/11   Alvstad, Casimiro Needle, RPH-CPP  calcium-vitamin D (OSCAL WITH D) 500-200 MG-UNIT per tablet Take 1 tablet by mouth every morning.     [provider]  cephALEXin (KEFLEX) 500 MG capsule Take 1 capsule (500 mg total) by mouth 2 (two) times daily. 01/28/16   Malvin Johns, MD  denosumab (PROLIA) 60 MG/ML SOLN injection Inject 60 mg into the skin every 6 (six) months. Administer in upper arm, thigh, or abdomen    [provider]  ELIQUIS  2.5 MG TABS tablet Take 2.5 mg by mouth 2 (two) times daily.  03/10/13   [provider]  feeding supplement, ENSURE ENLIVE, (ENSURE ENLIVE) LIQD Take 237 mLs by mouth 2 (two) times daily between meals. 09/15/14   Hongalgi, Lenis Dickinson, MD  Fluticasone Furoate-Vilanterol (BREO ELLIPTA IN) Inhale 1 puff into the lungs daily.    [provider]  furosemide (LASIX) 40 MG tablet Take 1 tablet (40 mg total) by mouth daily. 10/14/12   [name], MD  letrozole [facility]) 2.5 MG tablet Take 1 tablet (2.5 mg total) by mouth daily. 05/31/15   Magrinat, Virgie Dad, MD  Multiple Vitamin (MULTIVITAMIN WITH MINERALS) TABS Take 1 tablet by mouth every morning. Centrum 50+    [provider]  olmesartan (BENICAR) 20 MG tablet Take 20 mg by mouth daily.    [provider]  ondansetron (ZOFRAN ODT) 4 MG disintegrating tablet Take 1 tablet (4 mg total) by mouth every 8 (eight) hours as needed. 07/22/16   Isla Pence, MD  ondansetron (ZOFRAN) 4 MG tablet Take 4 mg by mouth every 8 (eight) hours as needed for nausea or vomiting.    [provider]  oxyCODONE-acetaminophen (PERCOCET/ROXICET) 5-325 MG tablet Take 2 tablets by mouth every 4 (four) hours as needed for severe pain. 07/22/16   Isla Pence, MD  polyethylene glycol (MIRALAX / Floria Raveling) packet Take 17 g by mouth daily as needed for mild constipation or moderate constipation.     [provider]  potassium chloride SA (K-DUR,KLOR-CON) 20 MEQ tablet Take 20 mEq by mouth daily.  04/13/13   [provider]  rOPINIRole (REQUIP) 0.25 MG tablet Take 0.25 mg by mouth at bedtime. 12/17/15   [provider]  traMADol (ULTRAM) 50 MG tablet Take 1 tablet (50 mg total) by mouth every 8 (eight) hours as needed for pain. Patient taking differently: Take 50 mg by mouth every 8 (eight) hours as needed for moderate pain or severe pain.  09/24/12   Orson Eva, MD  valACYclovir (VALTREX) 1000 MG tablet Take 1 tablet  (1,000 mg total) by mouth 3 (three) times daily. 07/22/16   Isla Pence, MD  Vitamin D, Ergocalciferol, (DRISDOL) 50000 UNITS CAPS Take 50,000 Units by mouth every 7 (seven) days.    [provider]    Family History Family History  Problem Relation Age of Onset  . Cancer Mother   . CAD Father   . Cancer Other     Social History Social History  Substance Use Topics  . Smoking status: Never Smoker  . Smokeless tobacco: Never Used  . Alcohol use No     Allergies   Codeine   Review of Systems Review of Systems  Skin: Positive for rash.  All other systems reviewed and are negative.    Physical Exam Updated Vital Signs BP (!) 145/57 (BP Location: Left Arm)   Pulse (!) 106   Temp 98.7 F (37.1 C) (Oral)   Resp 18   SpO2 96%   Physical Exam  Constitutional: She is oriented to person, place, and time. She appears well-developed and well-nourished.  HENT:  Head: Normocephalic and atraumatic.  Right Ear: External ear normal.  Left Ear: External ear normal.  Nose: Nose normal.  Mouth/Throat: Oropharynx is clear and moist.  Eyes: Conjunctivae and EOM are normal. Pupils are equal, round, and reactive to light.  Neck: Normal range of motion. Neck supple.  Cardiovascular: Normal rate, regular rhythm, normal heart sounds and intact distal pulses.   Pulmonary/Chest: Effort normal and breath sounds normal.  Abdominal: Soft. Bowel sounds are normal.  Musculoskeletal: Normal range of motion.  Neurological: She is alert and oriented to person, place, and time.  Skin: Skin is warm.  Psychiatric: She has a normal mood and affect. Her behavior is normal. Judgment and thought content normal.  Nursing note and vitals reviewed.    ED Treatments / Results  Labs (all labs ordered are listed, but only abnormal results are displayed) Labs Reviewed  D-DIMER, QUANTITATIVE (NOT AT [facility])    EKG  EKG Interpretation  Date/Time:  Sunday July 22 2016 22:06:38  EDT Ventricular Rate:  80 PR Interval:    QRS Duration: 88 QT Interval:  409 QTC Calculation: 472 R Axis:   67 Text Interpretation:  Atrial flutter Low voltage, extremity and precordial leads Confirmed by Isla Pence [phone]) on 07/22/2016 10:49:19 PM       Radiology No results found.  Procedures Procedures (including critical care time)  Medications Ordered in ED Medications  fentaNYL (SUBLIMAZE) injection 50 mcg (not administered)  valACYclovir (VALTREX) tablet 1,000 mg (1,000 mg Oral Given 07/22/16 2228)  dexamethasone (DECADRON) injection 10 mg (10 mg Intravenous Given 07/22/16 2228)     Initial Impression / Assessment and Plan / ED Course  I have reviewed the triage vital signs and the nursing notes.  Pertinent labs & imaging results that were available during my care of the patient were reviewed by me and considered in my medical decision making (see chart for details).    Pt's ddimer is negative.  Pt is anticoagulated on Eliquis.  Pain is from shingles rash.  Pt is stable for d/c.  Final Clinical Impressions(s) / ED Diagnoses   Final diagnoses:  Herpes zoster without complication    New Prescriptions New Prescriptions   ONDANSETRON (ZOFRAN ODT) 4 MG DISINTEGRATING TABLET    Take 1 tablet (4 mg total) by mouth every 8 (eight) hours as needed.   OXYCODONE-ACETAMINOPHEN (PERCOCET/ROXICET) 5-325 MG TABLET    Take 2 tablets by mouth every 4 (four) hours as needed for severe pain.   VALACYCLOVIR (VALTREX) 1000 MG TABLET    Take 1 tablet (1,000 mg total) by mouth 3 (three) times daily.     Isla Pence, MD 07/22/16 [phone]

## 2016-10-10 DIAGNOSIS — R7301 Impaired fasting glucose: Secondary | ICD-10-CM | POA: Diagnosis not present

## 2016-10-10 DIAGNOSIS — Z7901 Long term (current) use of anticoagulants: Secondary | ICD-10-CM | POA: Diagnosis not present

## 2016-10-10 DIAGNOSIS — M810 Age-related osteoporosis without current pathological fracture: Secondary | ICD-10-CM | POA: Diagnosis not present

## 2016-10-10 DIAGNOSIS — I480 Paroxysmal atrial fibrillation: Secondary | ICD-10-CM | POA: Diagnosis not present

## 2016-10-10 DIAGNOSIS — Z23 Encounter for immunization: Secondary | ICD-10-CM | POA: Diagnosis not present

## 2016-10-10 DIAGNOSIS — E784 Other hyperlipidemia: Secondary | ICD-10-CM | POA: Diagnosis not present

## 2016-10-10 DIAGNOSIS — M5489 Other dorsalgia: Secondary | ICD-10-CM | POA: Diagnosis not present

## 2016-10-10 DIAGNOSIS — Z6820 Body mass index (BMI) 20.0-20.9, adult: Secondary | ICD-10-CM | POA: Diagnosis not present

## 2016-10-10 DIAGNOSIS — N184 Chronic kidney disease, stage 4 (severe): Secondary | ICD-10-CM | POA: Diagnosis not present

## 2016-10-10 DIAGNOSIS — Z1389 Encounter for screening for other disorder: Secondary | ICD-10-CM | POA: Diagnosis not present

## 2016-10-10 DIAGNOSIS — I10 Essential (primary) hypertension: Secondary | ICD-10-CM | POA: Diagnosis not present

## 2016-11-07 ENCOUNTER — Ambulatory Visit (INDEPENDENT_AMBULATORY_CARE_PROVIDER_SITE_OTHER): Payer: Medicare Other | Admitting: Physician Assistant

## 2016-11-07 ENCOUNTER — Ambulatory Visit (INDEPENDENT_AMBULATORY_CARE_PROVIDER_SITE_OTHER): Payer: Medicare Other

## 2016-11-07 ENCOUNTER — Ambulatory Visit (INDEPENDENT_AMBULATORY_CARE_PROVIDER_SITE_OTHER): Payer: Medicare Other | Admitting: Orthopedic Surgery

## 2016-11-07 ENCOUNTER — Encounter (INDEPENDENT_AMBULATORY_CARE_PROVIDER_SITE_OTHER): Payer: Self-pay | Admitting: Physician Assistant

## 2016-11-07 DIAGNOSIS — G8929 Other chronic pain: Secondary | ICD-10-CM | POA: Diagnosis not present

## 2016-11-07 DIAGNOSIS — M25561 Pain in right knee: Secondary | ICD-10-CM

## 2016-11-07 DIAGNOSIS — M1711 Unilateral primary osteoarthritis, right knee: Secondary | ICD-10-CM

## 2016-11-07 MED ORDER — METHYLPREDNISOLONE ACETATE 40 MG/ML IJ SUSP
40.0000 mg | INTRAMUSCULAR | Status: AC | PRN
  Administered 2016-11-07: 40 mg via INTRA_ARTICULAR

## 2016-11-07 MED ORDER — LIDOCAINE HCL 1 % IJ SOLN
3.0000 mL | INTRAMUSCULAR | Status: AC | PRN
  Administered 2016-11-07: 3 mL

## 2016-11-07 NOTE — Progress Notes (Signed)
Office Visit Note   Patient: Holly Weaver           Date of Birth: 1923-09-01           MRN: [id number] Visit Date: 11/07/2016              Requested by: [name], MD [address] [place], [place] [address] PCP: [name], MD   Assessment & Plan: Visit Diagnoses:  1. Chronic pain of right knee   2. Primary osteoarthritis of right knee     Plan: she'll do quad strengthening exercises as shown. She is also given a knee brace just a simple pullover neoprene sleeve see if this helps. Follow up as needed  Follow-Up Instructions: Return if symptoms worsen or fail to improve.   Orders:  Orders Placed This Encounter  Procedures  . Large Joint Injection/Arthrocentesis  . XR KNEE 3 VIEW RIGHT   No orders of the defined types were placed in this encounter.     Procedures: Large Joint Inj Date/Time: 11/07/2016 5:24 PM Performed by: Pete Pelt Authorized by: Pete Pelt   Consent Given by:  Patient Indications:  Pain Location:  Knee Site:  R knee Needle Size:  22 G Approach:  Anterolateral Ultrasound Guidance: No   Fluoroscopic Guidance: No   Medications:  40 mg methylPREDNISolone acetate 40 MG/ML; 3 mL lidocaine 1 % Aspiration Attempted: No   Patient tolerance:  Patient tolerated the procedure well with no immediate complications     Clinical Data: No additional findings.   Subjective: Chief Complaint  Patient presents with  . Right Knee - Pain    HPI Holly Weaver is a 83 year old female who has had no known injury to the right knee. She's had pain that has began over the last week or so. She notes no real pain in the knee prior to this. She did have some swelling initially but this is subsided.she cannot weight-bear on the knee due to the pain. She's tried tramadol and heat without any real relief. She notes she has difficulty straightening the knee. She does have atrial fibrillation and is on Eliquis. Review of Systems Positive for pain  right knee.  Objective: Vital Signs: There were no vitals taken for this visit.  Physical Exam  Constitutional: She is oriented to person, place, and time. She appears well-developed and well-nourished. No distress.  Pulmonary/Chest: Effort normal.  Neurological: She is alert and oriented to person, place, and time.  Skin: She is not diaphoretic.  Psychiatric: She has a normal mood and affect.    Ortho Exam Right knee she lacks last few degrees full extension. Left knee full extension. Flexion right knee just beyond 90 forced flexion causes pain. No effusion abnormal warmth or erythema of either knee. No instability valgus varus stressing of either knee. Right knee with passive range of motion reveals significant crepitus. Specialty Comments:  No specialty comments available.  Imaging: Xr Knee 3 View Right  Result Date: 11/07/2016 AP lateral views of right knee:No acute fracture. Osteopenic appearing bone. Severe tricompartmental arthritis throughout.    PMFS History: Patient Active Problem List   Diagnosis Date Noted  . Osteopenia 12/30/2015  . Atelectasis 08/30/2015  . Dyspnea on exertion 04/04/2015  . Chronic pulmonary embolism ([place]) 04/04/2015  . Acute respiratory failure with hypoxia ([place]) 09/14/2014  . Lobar pneumonia due to unspecified organism 09/14/2014  . Protein-calorie malnutrition, severe ([place]) 09/14/2014  . Pressure ulcer 09/11/2014  . Breast cancer of upper-outer quadrant of left  female breast ([place]) 05/26/2013  . CAD in native artery, non obstructive by cath 2000 09/23/2012  . Dyslipidemia 09/21/2012  . Hypertension 09/21/2012  . PAF (paroxysmal atrial fibrillation), maintaining SR 05/10/2012  . Long term current use of anticoagulant therapy 05/10/2012   Past Medical History:  Diagnosis Date  . Arthritis    "hands and legs" (10/09/2012)  . Atrial fibrillation ([place])   . Breast cancer ([place]) ~ 2006   "left" (10/09/2012)  . Cervical spondylolysis 09/21/2012   . Chronic lower back pain   . Compression fracture 09/21/2012  . Dyslipidemia 09/21/2012  . Heart murmur   . History of blood transfusion    "years and years ago; I was anemic" (10/09/2012)  . Hypertension   . MVP (mitral valve prolapse) 09/23/2012  . Osteoarthritis 09/21/2012  . Osteoporosis   . Scoliosis   . Shortness of breath    "just now; nose is dry" (10/09/2012)    Family History  Problem Relation Age of Onset  . Cancer Mother   . CAD Father   . Cancer Other     Past Surgical History:  Procedure Laterality Date  . BREAST BIOPSY Left ~ 2006  . CARDIAC CATHETERIZATION  11/1998  . CATARACT EXTRACTION W/ INTRAOCULAR LENS IMPLANT Left 2000's  . FEMUR IM NAIL Left 09/21/2012   Procedure: INTRAMEDULLARY (IM) NAIL FEMORAL;  Surgeon: Newt Minion, MD;  Location: [facility];  Service: Orthopedics;  Laterality: Left;  . HERNIA REPAIR  1950's?   Social History   Occupational History  . Not on file.   Social History Main Topics  . Smoking status: Never Smoker  . Smokeless tobacco: Never Used  . Alcohol use No  . Drug use: No  . Sexual activity: No

## 2016-11-26 ENCOUNTER — Emergency Department (HOSPITAL_COMMUNITY): Payer: Medicare Other

## 2016-11-26 ENCOUNTER — Inpatient Hospital Stay (HOSPITAL_COMMUNITY)
Admission: EM | Admit: 2016-11-26 | Discharge: 2016-11-30 | DRG: 602 | Disposition: A | Discharge status: Skilled Nursing Facility | Payer: Medicare Other | Attending: Internal Medicine | Admitting: Internal Medicine

## 2016-11-26 ENCOUNTER — Encounter (HOSPITAL_COMMUNITY): Payer: Self-pay | Admitting: *Deleted

## 2016-11-26 DIAGNOSIS — I509 Heart failure, unspecified: Secondary | ICD-10-CM | POA: Diagnosis not present

## 2016-11-26 DIAGNOSIS — N184 Chronic kidney disease, stage 4 (severe): Secondary | ICD-10-CM | POA: Diagnosis present

## 2016-11-26 DIAGNOSIS — I480 Paroxysmal atrial fibrillation: Secondary | ICD-10-CM | POA: Diagnosis not present

## 2016-11-26 DIAGNOSIS — W19XXXA Unspecified fall, initial encounter: Secondary | ICD-10-CM | POA: Diagnosis present

## 2016-11-26 DIAGNOSIS — G8929 Other chronic pain: Secondary | ICD-10-CM | POA: Diagnosis present

## 2016-11-26 DIAGNOSIS — C50919 Malignant neoplasm of unspecified site of unspecified female breast: Secondary | ICD-10-CM

## 2016-11-26 DIAGNOSIS — L03115 Cellulitis of right lower limb: Secondary | ICD-10-CM | POA: Diagnosis present

## 2016-11-26 DIAGNOSIS — Z79899 Other long term (current) drug therapy: Secondary | ICD-10-CM

## 2016-11-26 DIAGNOSIS — I2510 Atherosclerotic heart disease of native coronary artery without angina pectoris: Secondary | ICD-10-CM | POA: Diagnosis not present

## 2016-11-26 DIAGNOSIS — Z9842 Cataract extraction status, left eye: Secondary | ICD-10-CM | POA: Diagnosis not present

## 2016-11-26 DIAGNOSIS — M1711 Unilateral primary osteoarthritis, right knee: Secondary | ICD-10-CM | POA: Diagnosis not present

## 2016-11-26 DIAGNOSIS — I340 Nonrheumatic mitral (valve) insufficiency: Secondary | ICD-10-CM | POA: Diagnosis not present

## 2016-11-26 DIAGNOSIS — Z7401 Bed confinement status: Secondary | ICD-10-CM | POA: Diagnosis not present

## 2016-11-26 DIAGNOSIS — I130 Hypertensive heart and chronic kidney disease with heart failure and stage 1 through stage 4 chronic kidney disease, or unspecified chronic kidney disease: Secondary | ICD-10-CM | POA: Diagnosis present

## 2016-11-26 DIAGNOSIS — Z961 Presence of intraocular lens: Secondary | ICD-10-CM | POA: Diagnosis present

## 2016-11-26 DIAGNOSIS — I351 Nonrheumatic aortic (valve) insufficiency: Secondary | ICD-10-CM | POA: Diagnosis not present

## 2016-11-26 DIAGNOSIS — R2689 Other abnormalities of gait and mobility: Secondary | ICD-10-CM | POA: Diagnosis not present

## 2016-11-26 DIAGNOSIS — M810 Age-related osteoporosis without current pathological fracture: Secondary | ICD-10-CM | POA: Diagnosis present

## 2016-11-26 DIAGNOSIS — Y92002 Bathroom of unspecified non-institutional (private) residence single-family (private) house as the place of occurrence of the external cause: Secondary | ICD-10-CM

## 2016-11-26 DIAGNOSIS — N3000 Acute cystitis without hematuria: Secondary | ICD-10-CM | POA: Diagnosis present

## 2016-11-26 DIAGNOSIS — R296 Repeated falls: Secondary | ICD-10-CM | POA: Diagnosis present

## 2016-11-26 DIAGNOSIS — L03116 Cellulitis of left lower limb: Principal | ICD-10-CM | POA: Diagnosis present

## 2016-11-26 DIAGNOSIS — Z7901 Long term (current) use of anticoagulants: Secondary | ICD-10-CM | POA: Diagnosis not present

## 2016-11-26 DIAGNOSIS — K5900 Constipation, unspecified: Secondary | ICD-10-CM | POA: Diagnosis present

## 2016-11-26 DIAGNOSIS — I5032 Chronic diastolic (congestive) heart failure: Secondary | ICD-10-CM | POA: Diagnosis present

## 2016-11-26 DIAGNOSIS — Z681 Body mass index (BMI) 19 or less, adult: Secondary | ICD-10-CM

## 2016-11-26 DIAGNOSIS — I10 Essential (primary) hypertension: Secondary | ICD-10-CM | POA: Diagnosis not present

## 2016-11-26 DIAGNOSIS — J449 Chronic obstructive pulmonary disease, unspecified: Secondary | ICD-10-CM | POA: Diagnosis present

## 2016-11-26 DIAGNOSIS — K449 Diaphragmatic hernia without obstruction or gangrene: Secondary | ICD-10-CM | POA: Diagnosis not present

## 2016-11-26 DIAGNOSIS — Z853 Personal history of malignant neoplasm of breast: Secondary | ICD-10-CM | POA: Diagnosis not present

## 2016-11-26 DIAGNOSIS — L89152 Pressure ulcer of sacral region, stage 2: Secondary | ICD-10-CM | POA: Diagnosis present

## 2016-11-26 DIAGNOSIS — N390 Urinary tract infection, site not specified: Secondary | ICD-10-CM

## 2016-11-26 DIAGNOSIS — R100 Acute abdomen: Secondary | ICD-10-CM | POA: Diagnosis not present

## 2016-11-26 DIAGNOSIS — E785 Hyperlipidemia, unspecified: Secondary | ICD-10-CM | POA: Diagnosis present

## 2016-11-26 DIAGNOSIS — G8911 Acute pain due to trauma: Secondary | ICD-10-CM | POA: Diagnosis not present

## 2016-11-26 DIAGNOSIS — K591 Functional diarrhea: Secondary | ICD-10-CM | POA: Diagnosis not present

## 2016-11-26 DIAGNOSIS — L0390 Cellulitis, unspecified: Secondary | ICD-10-CM | POA: Diagnosis not present

## 2016-11-26 DIAGNOSIS — M6281 Muscle weakness (generalized): Secondary | ICD-10-CM | POA: Diagnosis not present

## 2016-11-26 DIAGNOSIS — Z86711 Personal history of pulmonary embolism: Secondary | ICD-10-CM

## 2016-11-26 DIAGNOSIS — I872 Venous insufficiency (chronic) (peripheral): Secondary | ICD-10-CM | POA: Diagnosis present

## 2016-11-26 DIAGNOSIS — M25461 Effusion, right knee: Secondary | ICD-10-CM | POA: Diagnosis not present

## 2016-11-26 DIAGNOSIS — I361 Nonrheumatic tricuspid (valve) insufficiency: Secondary | ICD-10-CM | POA: Diagnosis not present

## 2016-11-26 DIAGNOSIS — S82201D Unspecified fracture of shaft of right tibia, subsequent encounter for closed fracture with routine healing: Secondary | ICD-10-CM | POA: Diagnosis not present

## 2016-11-26 DIAGNOSIS — R262 Difficulty in walking, not elsewhere classified: Secondary | ICD-10-CM

## 2016-11-26 DIAGNOSIS — L03119 Cellulitis of unspecified part of limb: Secondary | ICD-10-CM | POA: Diagnosis not present

## 2016-11-26 DIAGNOSIS — E559 Vitamin D deficiency, unspecified: Secondary | ICD-10-CM

## 2016-11-26 DIAGNOSIS — S92909A Unspecified fracture of unspecified foot, initial encounter for closed fracture: Secondary | ICD-10-CM | POA: Diagnosis not present

## 2016-11-26 DIAGNOSIS — S82141A Displaced bicondylar fracture of right tibia, initial encounter for closed fracture: Secondary | ICD-10-CM | POA: Diagnosis present

## 2016-11-26 DIAGNOSIS — M8580 Other specified disorders of bone density and structure, unspecified site: Secondary | ICD-10-CM | POA: Diagnosis not present

## 2016-11-26 DIAGNOSIS — E43 Unspecified severe protein-calorie malnutrition: Secondary | ICD-10-CM | POA: Diagnosis not present

## 2016-11-26 DIAGNOSIS — K921 Melena: Secondary | ICD-10-CM | POA: Diagnosis not present

## 2016-11-26 LAB — COMPREHENSIVE METABOLIC PANEL
ALBUMIN: 3.8 g/dL (ref 3.5–5.0)
ALT: 26 U/L (ref 14–54)
AST: 35 U/L (ref 15–41)
Alkaline Phosphatase: 105 U/L (ref 38–126)
Anion gap: 12 (ref 5–15)
BUN: 28 mg/dL — AB (ref 6–20)
CHLORIDE: 95 mmol/L — AB (ref 101–111)
CO2: 27 mmol/L (ref 22–32)
CREATININE: 1.41 mg/dL — AB (ref 0.44–1.00)
Calcium: 9.2 mg/dL (ref 8.9–10.3)
GFR calc Af Amer: 37 mL/min — ABNORMAL LOW (ref >60)
GFR calc non Af Amer: 32 mL/min — ABNORMAL LOW (ref >60)
Glucose, Bld: 117 mg/dL — ABNORMAL HIGH (ref 65–99)
POTASSIUM: 3.9 mmol/L (ref 3.5–5.1)
SODIUM: 134 mmol/L — AB (ref 135–145)
Total Bilirubin: 1.1 mg/dL (ref 0.3–1.2)
Total Protein: 7 g/dL (ref 6.5–8.1)

## 2016-11-26 LAB — LIPASE, BLOOD: Lipase: 25 U/L (ref 11–51)

## 2016-11-26 LAB — URINALYSIS, ROUTINE W REFLEX MICROSCOPIC
BILIRUBIN URINE: NEGATIVE
Glucose, UA: NEGATIVE mg/dL
KETONES UR: NEGATIVE mg/dL
Nitrite: NEGATIVE
PH: 7 (ref 5.0–8.0)
PROTEIN: NEGATIVE mg/dL
Specific Gravity, Urine: 1.008 (ref 1.005–1.030)

## 2016-11-26 LAB — CBC WITH DIFFERENTIAL/PLATELET
Basophils Absolute: 0 10*3/uL (ref 0.0–0.1)
Basophils Relative: 0 %
Eosinophils Absolute: 0 10*3/uL (ref 0.0–0.7)
Eosinophils Relative: 0 %
HEMATOCRIT: 39.4 % (ref 36.0–46.0)
Hemoglobin: 13.3 g/dL (ref 12.0–15.0)
LYMPHS ABS: 0.6 10*3/uL — AB (ref 0.7–4.0)
LYMPHS PCT: 7 %
MCH: 34.8 pg — AB (ref 26.0–34.0)
MCHC: 33.8 g/dL (ref 30.0–36.0)
MCV: 103.1 fL — AB (ref 78.0–100.0)
MONO ABS: 0.7 10*3/uL (ref 0.1–1.0)
Monocytes Relative: 8 %
NEUTROS ABS: 6.9 10*3/uL (ref 1.7–7.7)
Neutrophils Relative %: 85 %
Platelets: 270 10*3/uL (ref 150–400)
RBC: 3.82 MIL/uL — AB (ref 3.87–5.11)
RDW: 14.3 % (ref 11.5–15.5)
WBC: 8.1 10*3/uL (ref 4.0–10.5)

## 2016-11-26 LAB — I-STAT CG4 LACTIC ACID, ED: LACTIC ACID, VENOUS: 1.88 mmol/L (ref 0.5–1.9)

## 2016-11-26 MED ORDER — IOPAMIDOL (ISOVUE-300) INJECTION 61%
INTRAVENOUS | Status: AC
  Administered 2016-11-26: 100 mL
  Filled 2016-11-26: qty 100

## 2016-11-26 MED ORDER — SODIUM CHLORIDE 0.9 % IV BOLUS (SEPSIS)
1000.0000 mL | Freq: Once | INTRAVENOUS | Status: AC
  Administered 2016-11-26: 1000 mL via INTRAVENOUS

## 2016-11-26 MED ORDER — VANCOMYCIN HCL IN DEXTROSE 1-5 GM/200ML-% IV SOLN
1000.0000 mg | Freq: Once | INTRAVENOUS | Status: AC
  Administered 2016-11-26: 1000 mg via INTRAVENOUS
  Filled 2016-11-26: qty 200

## 2016-11-26 MED ORDER — DEXTROSE 5 % IV SOLN
2.0000 g | Freq: Once | INTRAVENOUS | Status: AC
  Administered 2016-11-26: 2 g via INTRAVENOUS
  Filled 2016-11-26 (×2): qty 2

## 2016-11-26 MED ORDER — HYDROCODONE-ACETAMINOPHEN 5-325 MG PO TABS
1.0000 | ORAL_TABLET | Freq: Once | ORAL | Status: AC
  Administered 2016-11-26: 1 via ORAL
  Filled 2016-11-26: qty 1

## 2016-11-26 NOTE — ED Notes (Signed)
When NT and RN assisted pt onto bedpan, it was noticed that pt has a stage 2 pressure area on her bottom.

## 2016-11-26 NOTE — H&P (Signed)
Holly Weaver GLO:[id number] DOB: 12-15-1923 DOA: 11/26/2016     PCP: [name], MD   Outpatient Specialists: [facility]   pulmonary [place] Patient coming from:   home Lives   With family   Chief Complaint: Multiple complaints including constipation followed by diarrhea, multiple falls and overall deconditioning  HPI: Holly Weaver is a 83 y.o. female with medical history significant of  arthritis, history of atrial fibrillation, history of PE, diastolic CHF protein, and malnutrition, remote breast cancer, CAD, HLD, HTN,    Presented with episode of diarrhea after patient to [facility] for constipation family was concern for colitis and brought him to emergency department they did report there was small scant amount of blood in stool. Family also endorses frequent falls. She's been having bilateral leg swelling and draining ulcerations. This been going on for the past few weeks. Spleen getting better then worse again. She has chronic right knee pain for which she's been followed by orthopedics has been going on for at least months and a half or so. Has been treated breath or peaks with steroid injections. Did not seem to have helped. Patient has been in severe pain and unable to bear any weight and being bed bound.  Patient currently reports some dysuria.   Regarding pertinent Chronic problems: Remote history of breast cancer currently in remission History of atrial fibrillation diastolic CHF and pulmonary embolism by CT scan in 2016 she was on Eliquis 2.5 mmol twice a day. She may have some bronchiectasis and scarring followed by pulmonology with a trial of Breo  IN ER:  Temp (24hrs), Avg:98.7 F (37.1 C), Min:98.7 F (37.1 C), Max:98.7 F (37.1 C)      on arrival  ED Triage Vitals [11/26/16 1522]  Enc Vitals Group     BP (!) 142/65     Pulse Rate 90     Resp 18     Temp 98.7 F (37.1 C)     Temp Source Oral     SpO2 93 %     Weight      Height    Head Circumference      Peak Flow      Pain Score      Pain Loc      Pain Edu?      Excl. in [place]?     Latest RR 16, 96% HR 106 BP145/74 LA 1.88 Na 134 k 3.9 BUN 28 Cr 1.41at baseline WBC 8.1 Hg 13.3 CT abdomen nonacute CXR non acute Following Medications were ordered in ER: Medications  cefTRIAXone (ROCEPHIN) 2 g in dextrose 5 % 50 mL IVPB (not administered)  sodium chloride 0.9 % bolus 1,000 mL (0 mLs Intravenous Stopped 11/26/16 1911)  vancomycin (VANCOCIN) IVPB 1000 mg/200 mL premix (0 mg Intravenous Stopped 11/26/16 1727)  iopamidol (ISOVUE-300) 61 % injection (100 mLs  Contrast Given 11/26/16 1826)     Hospitalist was called for admission for bilateral cellulitis secondary to low extremity ulcerations   Review of Systems:    Pertinent positives include: fatigue, weight loss   Constitutional:  No weight loss, night sweats, Fevers, chills,  HEENT:  No headaches, Difficulty swallowing,Tooth/dental problems,Sore throat,  No sneezing, itching, ear ache, nasal congestion, post nasal drip,  Cardio-vascular:  No chest pain, Orthopnea, PND, anasarca, dizziness, palpitations.no Bilateral lower extremity swelling  GI:  No heartburn, indigestion, abdominal pain, nausea, vomiting, diarrhea, change in bowel habits, loss of appetite, melena, blood in stool, hematemesis Resp:  no shortness of  breath at rest. No dyspnea on exertion, No excess mucus, no productive cough, No non-productive cough, No coughing up of blood.No change in color of mucus.No wheezing. Skin:  no rash or lesions. No jaundice GU:  no dysuria, change in color of urine, no urgency or frequency. No straining to urinate.  No flank pain.  Musculoskeletal:  No joint pain or no joint swelling. No decreased range of motion. No back pain.  Psych:  No change in mood or affect. No depression or anxiety. No memory loss.  Neuro: no localizing neurological complaints, no tingling, no weakness, no double vision, no gait  abnormality, no slurred speech, no confusion  As per HPI otherwise 10 point review of systems negative.   Past Medical History: Past Medical History:  Diagnosis Date  . Arthritis    "hands and legs" (10/09/2012)  . Atrial fibrillation ([place])   . Breast cancer ([place]) ~ 2006   "left" (10/09/2012)  . Cervical spondylolysis 09/21/2012  . Chronic lower back pain   . Compression fracture 09/21/2012  . Dyslipidemia 09/21/2012  . Heart murmur   . History of blood transfusion    "years and years ago; I was anemic" (10/09/2012)  . Hypertension   . MVP (mitral valve prolapse) 09/23/2012  . Osteoarthritis 09/21/2012  . Osteoporosis   . Scoliosis   . Shortness of breath    "just now; nose is dry" (10/09/2012)   Past Surgical History:  Procedure Laterality Date  . BREAST BIOPSY Left ~ 2006  . CARDIAC CATHETERIZATION  11/1998  . CATARACT EXTRACTION W/ INTRAOCULAR LENS IMPLANT Left 2000's  . HERNIA REPAIR  1950's?     Social History:  Ambulatory  bed bound    reports that  has never smoked. she has never used smokeless tobacco. She reports that she does not drink alcohol or use drugs.  Allergies:   Allergies  Allergen Reactions  . Codeine Nausea And Vomiting       Family History:   Family History  Problem Relation Age of Onset  . Cancer Mother   . CAD Father   . Cancer Other     Medications: Prior to Admission medications   Medication Sig Start Date End Date Taking? Authorizing Provider  ALPRAZolam Duanne Moron) 0.25 MG tablet Take one tablet by mouth every 12 hours as needed for severe anxiety Patient taking differently: Take 0.25 mg by mouth 2 (two) times daily as needed for anxiety. Take one tablet by mouth every 12 hours as needed for severe anxiety 09/30/12  Yes Lauree Chandler, NP  amiodarone (PACERONE) 200 MG tablet Take 200 mg by mouth every morning.  12/06/11  Yes Alvstad, Kristin L, RPH-CPP  atorvastatin (LIPITOR) 40 MG tablet Take 40 mg daily by mouth.   Yes [provider]  calcium-vitamin D (OSCAL WITH D) 500-200 MG-UNIT per tablet Take 1 tablet by mouth every morning.    Yes [provider]  denosumab (PROLIA) 60 MG/ML SOLN injection Inject 60 mg into the skin every 6 (six) months. Administer in upper arm, thigh, or abdomen   Yes [provider]  ELIQUIS 2.5 MG TABS tablet Take 2.5 mg by mouth 2 (two) times daily.  03/10/13  Yes [provider]  feeding supplement, ENSURE ENLIVE, (ENSURE ENLIVE) LIQD Take 237 mLs by mouth 2 (two) times daily between meals. 09/15/14  Yes Hongalgi, Lenis Dickinson, MD  fluticasone furoate-vilanterol (BREO ELLIPTA) 200-25 MCG/INH AEPB Inhale 1 puff into the lungs daily.   Yes [provider]  furosemide (LASIX) 40 MG tablet Take 1 tablet (40 mg total) by mouth daily. 10/14/12  Yes [name], MD  HYDROcodone-acetaminophen (NORCO/VICODIN) 5-325 MG tablet Take 1 tablet by mouth every 4 (four) hours as needed. 07/22/16  Yes Isla Pence, MD  letrozole [facility]) 2.5 MG tablet Take 1 tablet (2.5 mg total) by mouth daily. 05/31/15  Yes Magrinat, Virgie Dad, MD  lidocaine (LIDODERM) 5 % Place 1 patch onto the skin. UNWRAP AND APPLY 1 PATCH TO SKIN DAILY FOR 12 HOURS AND 12 HOURS OFF 11/22/16  Yes [provider]  Multiple Vitamin (MULTIVITAMIN WITH MINERALS) TABS Take 1 tablet by mouth every morning. Centrum 50+   Yes [provider]  olmesartan (BENICAR) 20 MG tablet Take 20 mg by mouth daily.   Yes [provider]  ondansetron (ZOFRAN) 4 MG tablet Take 4 mg by mouth every 8 (eight) hours as needed for nausea or vomiting.   Yes [provider]  polyethylene glycol (MIRALAX / GLYCOLAX) packet Take 17 g by mouth daily as needed for mild constipation or moderate constipation.    Yes [provider]  potassium chloride SA (K-DUR,KLOR-CON) 20 MEQ tablet Take 20 mEq by mouth daily.  04/13/13  Yes [provider]  traMADol (ULTRAM) 50 MG tablet Take 1 tablet  (50 mg total) by mouth every 8 (eight) hours as needed for pain. Patient taking differently: Take 50 mg by mouth every 8 (eight) hours as needed for moderate pain or severe pain.  09/24/12  Yes Tat, Shanon Brow, MD  Vitamin D, Ergocalciferol, (DRISDOL) 50000 UNITS CAPS Take 50,000 Units by mouth every 7 (seven) days.   Yes [provider]  cephALEXin (KEFLEX) 500 MG capsule Take 1 capsule (500 mg total) by mouth 2 (two) times daily. Patient not taking: Reported on 11/26/2016 01/28/16   Malvin Johns, MD  ondansetron (ZOFRAN ODT) 4 MG disintegrating tablet Take 1 tablet (4 mg total) by mouth every 8 (eight) hours as needed. Patient not taking: Reported on 11/26/2016 07/22/16   Isla Pence, MD  valACYclovir (VALTREX) 1000 MG tablet Take 1 tablet (1,000 mg total) by mouth 3 (three) times daily. Patient not taking: Reported on 11/26/2016 07/22/16   Isla Pence, MD    Physical Exam: Patient Vitals for the past 24 hrs:  BP Temp Temp src Pulse Resp SpO2  11/26/16 1946 (!) 145/74 - - (!) 106 16 96 %  11/26/16 1522 (!) 142/65 98.7 F (37.1 C) Oral 90 18 93 %    1. General:  in No Acute distress   Chronically ill     2. Psychological: Alert and   Oriented 3. Head/ENT:    Dry Mucous Membranes                          Head Non traumatic, neck supple                           Poor Dentition 4. SKIN:  decreased Skin turgor,  Skin clean erythema bilaterally with swelling and purulent ulcer on the right leg       5. Heart: irregular rate and rhythm    Murmur, no Rub or gallop 6. Lungs:  no wheezes or crackles   7. Abdomen: Soft,  non-tender, Non distended    bowel sounds present 8. Lower extremities: no clubbing, cyanosis, or edema 9. Neurologically Grossly intact, moving all 4 extremities equally   10. MSK:  Normal range of motion   body mass index is unknown because there is no height or weight on file.  Labs on Admission:   Labs on Admission: I have personally reviewed following labs  and imaging studies  CBC: Recent Labs  Lab 11/26/16 1529  WBC 8.1  NEUTROABS 6.9  HGB 13.3  HCT 39.4  MCV 103.1*  PLT 382   Basic Metabolic Panel: Recent Labs  Lab 11/26/16 1529  NA 134*  K 3.9  CL 95*  CO2 27  GLUCOSE 117*  BUN 28*  CREATININE 1.41*  CALCIUM 9.2   GFR: CrCl cannot be calculated (Unknown ideal weight.). Liver Function Tests: Recent Labs  Lab 11/26/16 1529  AST 35  ALT 26  ALKPHOS 105  BILITOT 1.1  PROT 7.0  ALBUMIN 3.8   Recent Labs  Lab 11/26/16 1529  LIPASE 25   No results for input(s): AMMONIA in the last 168 hours. Coagulation Profile: No results for input(s): INR, PROTIME in the last 168 hours. Cardiac Enzymes: No results for input(s): CKTOTAL, CKMB, CKMBINDEX, TROPONINI in the last 168 hours. BNP (last 3 results) No results for input(s): PROBNP in the last 8760 hours. HbA1C: No results for input(s): HGBA1C in the last 72 hours. CBG: No results for input(s): GLUCAP in the last 168 hours. Lipid Profile: No results for input(s): CHOL, HDL, LDLCALC, TRIG, CHOLHDL, LDLDIRECT in the last 72 hours. Thyroid Function Tests: No results for input(s): TSH, T4TOTAL, FREET4, T3FREE, THYROIDAB in the last 72 hours. Anemia Panel: No results for input(s): VITAMINB12, FOLATE, FERRITIN, TIBC, IRON, RETICCTPCT in the last 72 hours. Urine analysis:    Component Value Date/Time   COLORURINE YELLOW 11/26/2016 1910   APPEARANCEUR HAZY (A) 11/26/2016 1910   LABSPEC 1.008 11/26/2016 1910   PHURINE 7.0 11/26/2016 1910   GLUCOSEU NEGATIVE 11/26/2016 1910   HGBUR LARGE (A) 11/26/2016 1910   BILIRUBINUR NEGATIVE 11/26/2016 1910   KETONESUR NEGATIVE 11/26/2016 1910   PROTEINUR NEGATIVE 11/26/2016 1910   UROBILINOGEN 1.0 09/11/2014 0111   NITRITE NEGATIVE 11/26/2016 1910   LEUKOCYTESUR LARGE (A) 11/26/2016 1910   Sepsis Labs: @LABRCNTIP (procalcitonin:4,lacticidven:4) )No results found for this or any previous visit (from the past 240 hour(s)).      UA evidence of leukocytosis with few bacteria   No results found for: HGBA1C  CrCl cannot be calculated (Unknown ideal weight.).  BNP (last 3 results) No results for input(s): PROBNP in the last 8760 hours.   ECG REPORT  Independently reviewed Rate: 100  Rhythm: a.fib ST&T Change: No acute ischemic changes   QTC  509  There were no vitals filed for this visit.   Cultures:    Component Value Date/Time   SDES URINE, CLEAN CATCH 09/11/2014 0809   SPECREQUEST NONE 09/11/2014 0809   CULT  09/11/2014 0457    NO GROWTH 5 DAYS Performed at [place] 09/13/2014 FINAL 09/11/2014 0809     Radiological Exams on Admission: Dg Chest 2 View  Result Date: 11/26/2016 CLINICAL DATA:  UTI, weakness, history breast cancer, hypertension, atrial fibrillation EXAM: CHEST  2 VIEW COMPARISON:  09/13/2014 FINDINGS: Enlargement of cardiac silhouette. Atherosclerotic calcification aorta. Pulmonary vascularity normal. Moderate-sized hiatal hernia again seen. Lungs appear emphysematous but clear. No acute infiltrate, pleural effusion or pneumothorax. Diffuse osseous demineralization with evidence of prior vertebroplasties at the thoracolumbar junction. Scattered costal cartilaginous calcifications. IMPRESSION: Enlargement of cardiac silhouette. Moderate-sized hiatal hernia. Emphysematous changes without definite infiltrate. Electronically Signed   By: Lavonia Dana  M.D.   On: 11/26/2016 17:11   Ct Abdomen Pelvis W Contrast  Result Date: 11/26/2016 CLINICAL DATA:  Rash left flank 2 weeks. EXAM: CT ABDOMEN AND PELVIS WITH CONTRAST TECHNIQUE: Multidetector CT imaging of the abdomen and pelvis was performed using the standard protocol following bolus administration of intravenous contrast. CONTRAST:  178mL ISOVUE-300 IOPAMIDOL (ISOVUE-300) INJECTION 61% COMPARISON:  CT abdomen/pelvis 12/12/2006 and CT chest 04/12/2015 FINDINGS: Lower chest: Minimal bibasilar atelectasis/scarring.  Calcification of the mitral valve annulus. Mild cardiomegaly. Moderate size hiatal hernia. Hepatobiliary: Within normal. Pancreas: Within normal. Spleen: Within normal. Adrenals/Urinary Tract: Adrenal glands are normal. Kidneys are within normal in size. No hydronephrosis or nephrolithiasis. 2 cm homogeneously hypodense mass over the upper pole left renal cortex likely hyperdense cysts. Ureters are within normal. There is moderate bladder distention. Stomach/Bowel: Moderate size hiatal hernia as the stomach is otherwise unremarkable. Small bowel is within normal. Appendix is normal. Moderate fecal retention throughout the colon. Vascular/Lymphatic: Moderate calcified plaque over the abdominal aorta and iliac arteries. No adenopathy. Reproductive: Unremarkable. Other: No free fluid or focal inflammatory change. Musculoskeletal: Hardware intact over the left femur. Bilateral degenerative changes of the hips. Severe degenerative change of the spine with curvature of the lumbar spine convex right. Previous kyphoplasties of T11 and T12 compression fractures. Moderate L1 compression fracture unchanged. IMPRESSION: No acute findings in the abdomen/pelvis. Mild to moderate bladder distention. Moderate size hiatal hernia. Aortic Atherosclerosis (ICD10-I70.0). 2 cm hypodensity over the upper pole left renal cortex indeterminate but likely a hyperdense cyst. Recommend followup ultrasound on elective basis. Stable compression fractures. Electronically Signed   By: Marin Olp M.D.   On: 11/26/2016 19:20   Dg Knee Complete 4 Views Right  Result Date: 11/26/2016 CLINICAL DATA:  Knee pain and weakness, UTI EXAM: RIGHT KNEE - COMPLETE 4+ VIEW COMPARISON:  11/07/2016 FINDINGS: Marked osseous demineralization. Advanced tricompartmental osteoarthritic changes with joint space narrowing greatest at lateral compartment. Small spurs at lateral compartment and patellofemoral joint. Mildly depressed fracture of the medial tibial  plateau with associated small knee joint effusion. No additional fracture, dislocation, or bone destruction. Atherosclerotic calcifications of the superficial femoral and popliteal arteries. Calcifications at the suprapatellar region question muscular. IMPRESSION: Osseous demineralization with a depressed fracture of the RIGHT medial tibial plateau and small knee joint effusion. Degenerative changes RIGHT knee. Electronically Signed   By: Lavonia Dana M.D.   On: 11/26/2016 17:10    Chart has been reviewed    Assessment/Plan   83 y.o. female with medical history significant of  arthritis, history of atrial fibrillation, history of PE, diastolic CHF protein, and malnutrition, remote breast cancer, CAD, HLD, HTN,  Admitted for  bilateral cellulitis secondary to low extremity ulcerations   Present on Admission: . Cellulitis of lower extremities secondary to chronic ulcers and probably venous insufficiency with peripheral edema would benefit from extensive wound care for now cover with IV antibiotics. Obtain MRSA screen, check ABI . Acute lower UTI - patient reports dysuria will await results of urine culture and treat with IV antibiotics for now . CAD in native artery, non obstructive by cath 2000  - currently stable denies any chest pain . Hypertension continue ARB  currently stable . PAF (paroxysmal atrial fibrillation), maintaining SR -           - CHA2DS2 vas score  5: continue current anticoagulation with Eliquis,           -  Rate control:  Currently controlled          -  Rhythm control: Continue amiodarone  . Protein-calorie malnutrition, severe ([place]) obtain nutritional consult check prealbumin . Falls - patient this point bedbound will have PT OT evaluate will probably benefit from placement    Other plan as per orders.  DVT prophylaxis:  eliquis  Code Status:  FULL CODE as per patient    Family Communication:   Family not  at  Bedside    Disposition Plan:     likely will need  placement for rehabilitation                                                 Would benefit from PT/OT eval prior to DC   ordered                                             Consults called: none   Admission status:   inpatient       Level of care     tele        I have spent a total of 56 min on this admission   Holly Weaver 11/26/2016, 11:14 PM    [facility]  Pager [phone]   after 2 AM please page floor coverage PA If 7AM-7PM, please contact the day team taking care of the patient  [email or web address]  Password [email or web address]

## 2016-11-26 NOTE — ED Triage Notes (Signed)
Pt complains of diarrhea. Pt has been taking miralax and suppositories for constipation. Pt's last BM was last night. Pt has hx of colitis. VSS.

## 2016-11-26 NOTE — ED Provider Notes (Signed)
[facility] DEPT Provider Note   CSN: [id number] Arrival date & time: 11/26/16  1459     History   Chief Complaint Chief Complaint  Patient presents with  . Diarrhea    HPI Holly Weaver is a 83 y.o. female.  Pt presents to the ED today with multiple complaints.  She did have constipation, so took miralax.  She has had diarrhea since then.  Her daughter said the diarrhea will now not stop.  They have given her imodium without help.  The daughter said there has also been blood in the stool.  She is very worried the mom has colitis.  The pt also c/o bilateral lower extremity redness and swelling.  The redness has been intermittent for weeks.  It will get better, then get worse.  It has gotten worse in the last 2 days.   Pt also c/o right knee pain.  The knee pain has been going on for 5 weeks.  She has seen ortho and has had injections.  The pt said the injections have not helped.  Pt is bed bound and is no longer able to walk.  The pt's daughter feels like she can no longer take care of her mother at home.      Past Medical History:  Diagnosis Date  . Arthritis    "hands and legs" (10/09/2012)  . Atrial fibrillation ([place])   . Breast cancer ([place]) ~ 2006   "left" (10/09/2012)  . Cervical spondylolysis 09/21/2012  . Chronic lower back pain   . Compression fracture 09/21/2012  . Dyslipidemia 09/21/2012  . Heart murmur   . History of blood transfusion    "years and years ago; I was anemic" (10/09/2012)  . Hypertension   . MVP (mitral valve prolapse) 09/23/2012  . Osteoarthritis 09/21/2012  . Osteoporosis   . Scoliosis   . Shortness of breath    "just now; nose is dry" (10/09/2012)    Patient Active Problem List   Diagnosis Date Noted  . Acute lower UTI 11/26/2016  . Falls 11/26/2016  . Cellulitis 11/26/2016  . Osteopenia 12/30/2015  . Atelectasis 08/30/2015  . Dyspnea on exertion 04/04/2015  . Chronic pulmonary embolism ([place]) 04/04/2015  .  Acute respiratory failure with hypoxia ([place]) 09/14/2014  . Lobar pneumonia due to unspecified organism 09/14/2014  . Protein-calorie malnutrition, severe ([place]) 09/14/2014  . Pressure ulcer 09/11/2014  . Breast cancer of upper-outer quadrant of left female breast ([place]) 05/26/2013  . CAD in native artery, non obstructive by cath 2000 09/23/2012  . Dyslipidemia 09/21/2012  . Hypertension 09/21/2012  . PAF (paroxysmal atrial fibrillation), maintaining SR 05/10/2012  . Long term current use of anticoagulant therapy 05/10/2012    Past Surgical History:  Procedure Laterality Date  . BREAST BIOPSY Left ~ 2006  . CARDIAC CATHETERIZATION  11/1998  . CATARACT EXTRACTION W/ INTRAOCULAR LENS IMPLANT Left 2000's  . HERNIA REPAIR  1950's?    OB History    No data available       Home Medications    Prior to Admission medications   Medication Sig Start Date End Date Taking? Authorizing Provider  ALPRAZolam Duanne Moron) 0.25 MG tablet Take one tablet by mouth every 12 hours as needed for severe anxiety Patient taking differently: Take 0.25 mg by mouth 2 (two) times daily as needed for anxiety. Take one tablet by mouth every 12 hours as needed for severe anxiety 09/30/12  Yes Lauree Chandler, NP  amiodarone (PACERONE) 200 MG tablet  Take 200 mg by mouth every morning.  12/06/11  Yes Alvstad, Kristin L, RPH-CPP  atorvastatin (LIPITOR) 40 MG tablet Take 40 mg daily by mouth.   Yes [provider]  calcium-vitamin D (OSCAL WITH D) 500-200 MG-UNIT per tablet Take 1 tablet by mouth every morning.    Yes [provider]  denosumab (PROLIA) 60 MG/ML SOLN injection Inject 60 mg into the skin every 6 (six) months. Administer in upper arm, thigh, or abdomen   Yes [provider]  ELIQUIS 2.5 MG TABS tablet Take 2.5 mg by mouth 2 (two) times daily.  03/10/13  Yes [provider]  feeding supplement, ENSURE ENLIVE, (ENSURE ENLIVE) LIQD Take 237 mLs by mouth 2 (two) times daily  between meals. 09/15/14  Yes Hongalgi, Lenis Dickinson, MD  fluticasone furoate-vilanterol (BREO ELLIPTA) 200-25 MCG/INH AEPB Inhale 1 puff into the lungs daily.   Yes [provider]  furosemide (LASIX) 40 MG tablet Take 1 tablet (40 mg total) by mouth daily. 10/14/12  Yes [name], MD  HYDROcodone-acetaminophen (NORCO/VICODIN) 5-325 MG tablet Take 1 tablet by mouth every 4 (four) hours as needed. 07/22/16  Yes Isla Pence, MD  letrozole [facility]) 2.5 MG tablet Take 1 tablet (2.5 mg total) by mouth daily. 05/31/15  Yes Magrinat, Virgie Dad, MD  lidocaine (LIDODERM) 5 % Place 1 patch onto the skin. UNWRAP AND APPLY 1 PATCH TO SKIN DAILY FOR 12 HOURS AND 12 HOURS OFF 11/22/16  Yes [provider]  Multiple Vitamin (MULTIVITAMIN WITH MINERALS) TABS Take 1 tablet by mouth every morning. Centrum 50+   Yes [provider]  olmesartan (BENICAR) 20 MG tablet Take 20 mg by mouth daily.   Yes [provider]  ondansetron (ZOFRAN) 4 MG tablet Take 4 mg by mouth every 8 (eight) hours as needed for nausea or vomiting.   Yes [provider]  polyethylene glycol (MIRALAX / GLYCOLAX) packet Take 17 g by mouth daily as needed for mild constipation or moderate constipation.    Yes [provider]  potassium chloride SA (K-DUR,KLOR-CON) 20 MEQ tablet Take 20 mEq by mouth daily.  04/13/13  Yes [provider]  traMADol (ULTRAM) 50 MG tablet Take 1 tablet (50 mg total) by mouth every 8 (eight) hours as needed for pain. Patient taking differently: Take 50 mg by mouth every 8 (eight) hours as needed for moderate pain or severe pain.  09/24/12  Yes Tat, Shanon Brow, MD  Vitamin D, Ergocalciferol, (DRISDOL) 50000 UNITS CAPS Take 50,000 Units by mouth every 7 (seven) days.   Yes [provider]  cephALEXin (KEFLEX) 500 MG capsule Take 1 capsule (500 mg total) by mouth 2 (two) times daily. Patient not taking: Reported on 11/26/2016 01/28/16   Malvin Johns, MD  ondansetron  (ZOFRAN ODT) 4 MG disintegrating tablet Take 1 tablet (4 mg total) by mouth every 8 (eight) hours as needed. Patient not taking: Reported on 11/26/2016 07/22/16   Isla Pence, MD  valACYclovir (VALTREX) 1000 MG tablet Take 1 tablet (1,000 mg total) by mouth 3 (three) times daily. Patient not taking: Reported on 11/26/2016 07/22/16   Isla Pence, MD    Family History Family History  Problem Relation Age of Onset  . Cancer Mother   . CAD Father   . Cancer Other     Social History Social History   Tobacco Use  . Smoking status: Never Smoker  . Smokeless tobacco: Never Used  Substance Use Topics  . Alcohol use: No  .  Drug use: No     Allergies   Codeine   Review of Systems Review of Systems  Gastrointestinal: Positive for diarrhea.  Musculoskeletal:       Right knee pain  Skin:       Bilateral lower extremity redness  All other systems reviewed and are negative.    Physical Exam Updated Vital Signs BP (!) 145/74   Pulse (!) 106   Temp 98.7 F (37.1 C) (Oral)   Resp 16   SpO2 96%   Physical Exam  Constitutional: She appears well-developed and well-nourished.  HENT:  Head: Normocephalic and atraumatic.  Right Ear: External ear normal.  Left Ear: External ear normal.  Nose: Nose normal.  Mouth/Throat: Oropharynx is clear and moist.  Eyes: Conjunctivae and EOM are normal. Pupils are equal, round, and reactive to light.  Neck: Normal range of motion. Neck supple.  Cardiovascular: Normal rate, regular rhythm, normal heart sounds and intact distal pulses.  Pulmonary/Chest: Effort normal and breath sounds normal.  Abdominal: Soft. Bowel sounds are normal.  Musculoskeletal:       Right knee: Tenderness found.  Bilateral LE redness/cellulitis and swelling up to mid calf.  Skin: Skin is warm.  Bilateral le cellulitis as noted above.  Psychiatric: She has a normal mood and affect. Her behavior is normal. Thought content normal.  Nursing note and vitals  reviewed.    ED Treatments / Results  Labs (all labs ordered are listed, but only abnormal results are displayed) Labs Reviewed  COMPREHENSIVE METABOLIC PANEL - Abnormal; Notable for the following components:      Result Value   Sodium 134 (*)    Chloride 95 (*)    Glucose, Bld 117 (*)    BUN 28 (*)    Creatinine, Ser 1.41 (*)    GFR calc non Af Amer 32 (*)    GFR calc Af Amer 37 (*)    All other components within normal limits  CBC WITH DIFFERENTIAL/PLATELET - Abnormal; Notable for the following components:   RBC 3.82 (*)    MCV 103.1 (*)    MCH 34.8 (*)    Lymphs Abs 0.6 (*)    All other components within normal limits  URINALYSIS, ROUTINE W REFLEX MICROSCOPIC - Abnormal; Notable for the following components:   APPearance HAZY (*)    Hgb urine dipstick LARGE (*)    Leukocytes, UA LARGE (*)    Bacteria, UA FEW (*)    Squamous Epithelial / LPF 0-5 (*)    All other components within normal limits  LIPASE, BLOOD  I-STAT CG4 LACTIC ACID, ED    EKG  EKG Interpretation  Date/Time:  Monday November 26 2016 16:01:17 EST Ventricular Rate:  100 PR Interval:    QRS Duration: 98 QT Interval:  394 QTC Calculation: 509 R Axis:   73 Text Interpretation:  Atrial fibrillation Borderline T abnormalities, inferior leads Prolonged QT interval Baseline wander in lead(s) V3 Baseline wander Confirmed by Isla Pence [phone]) on 11/26/2016 4:13:14 PM       Radiology Dg Chest 2 View  Result Date: 11/26/2016 CLINICAL DATA:  UTI, weakness, history breast cancer, hypertension, atrial fibrillation EXAM: CHEST  2 VIEW COMPARISON:  09/13/2014 FINDINGS: Enlargement of cardiac silhouette. Atherosclerotic calcification aorta. Pulmonary vascularity normal. Moderate-sized hiatal hernia again seen. Lungs appear emphysematous but clear. No acute infiltrate, pleural effusion or pneumothorax. Diffuse osseous demineralization with evidence of prior vertebroplasties at the thoracolumbar junction.  Scattered costal cartilaginous calcifications. IMPRESSION: Enlargement of cardiac silhouette. Moderate-sized  hiatal hernia. Emphysematous changes without definite infiltrate. Electronically Signed   By: Lavonia Dana M.D.   On: 11/26/2016 17:11   Ct Abdomen Pelvis W Contrast  Result Date: 11/26/2016 CLINICAL DATA:  Rash left flank 2 weeks. EXAM: CT ABDOMEN AND PELVIS WITH CONTRAST TECHNIQUE: Multidetector CT imaging of the abdomen and pelvis was performed using the standard protocol following bolus administration of intravenous contrast. CONTRAST:  170mL ISOVUE-300 IOPAMIDOL (ISOVUE-300) INJECTION 61% COMPARISON:  CT abdomen/pelvis 12/12/2006 and CT chest 04/12/2015 FINDINGS: Lower chest: Minimal bibasilar atelectasis/scarring. Calcification of the mitral valve annulus. Mild cardiomegaly. Moderate size hiatal hernia. Hepatobiliary: Within normal. Pancreas: Within normal. Spleen: Within normal. Adrenals/Urinary Tract: Adrenal glands are normal. Kidneys are within normal in size. No hydronephrosis or nephrolithiasis. 2 cm homogeneously hypodense mass over the upper pole left renal cortex likely hyperdense cysts. Ureters are within normal. There is moderate bladder distention. Stomach/Bowel: Moderate size hiatal hernia as the stomach is otherwise unremarkable. Small bowel is within normal. Appendix is normal. Moderate fecal retention throughout the colon. Vascular/Lymphatic: Moderate calcified plaque over the abdominal aorta and iliac arteries. No adenopathy. Reproductive: Unremarkable. Other: No free fluid or focal inflammatory change. Musculoskeletal: Hardware intact over the left femur. Bilateral degenerative changes of the hips. Severe degenerative change of the spine with curvature of the lumbar spine convex right. Previous kyphoplasties of T11 and T12 compression fractures. Moderate L1 compression fracture unchanged. IMPRESSION: No acute findings in the abdomen/pelvis. Mild to moderate bladder distention.  Moderate size hiatal hernia. Aortic Atherosclerosis (ICD10-I70.0). 2 cm hypodensity over the upper pole left renal cortex indeterminate but likely a hyperdense cyst. Recommend followup ultrasound on elective basis. Stable compression fractures. Electronically Signed   By: Marin Olp M.D.   On: 11/26/2016 19:20   Dg Knee Complete 4 Views Right  Result Date: 11/26/2016 CLINICAL DATA:  Knee pain and weakness, UTI EXAM: RIGHT KNEE - COMPLETE 4+ VIEW COMPARISON:  11/07/2016 FINDINGS: Marked osseous demineralization. Advanced tricompartmental osteoarthritic changes with joint space narrowing greatest at lateral compartment. Small spurs at lateral compartment and patellofemoral joint. Mildly depressed fracture of the medial tibial plateau with associated small knee joint effusion. No additional fracture, dislocation, or bone destruction. Atherosclerotic calcifications of the superficial femoral and popliteal arteries. Calcifications at the suprapatellar region question muscular. IMPRESSION: Osseous demineralization with a depressed fracture of the RIGHT medial tibial plateau and small knee joint effusion. Degenerative changes RIGHT knee. Electronically Signed   By: Lavonia Dana M.D.   On: 11/26/2016 17:10    Procedures Procedures (including critical care time)  Medications Ordered in ED Medications  cefTRIAXone (ROCEPHIN) 2 g in dextrose 5 % 50 mL IVPB (not administered)  sodium chloride 0.9 % bolus 1,000 mL (0 mLs Intravenous Stopped 11/26/16 1911)  vancomycin (VANCOCIN) IVPB 1000 mg/200 mL premix (0 mg Intravenous Stopped 11/26/16 1727)  iopamidol (ISOVUE-300) 61 % injection (100 mLs  Contrast Given 11/26/16 1826)     Initial Impression / Assessment and Plan / ED Course  I have reviewed the triage vital signs and the nursing notes.  Pertinent labs & imaging results that were available during my care of the patient were reviewed by me and considered in my medical decision making (see chart for  details).    Pt is feeling some better after her legs have been elevated.  She was given vancomycin for her legs and was given rocephin for her uti.  She is unable to walk.  She was d/w Dr. Roel Cluck ([facility]) for admission.  Final Clinical Impressions(s) /  ED Diagnoses   Final diagnoses:  Ambulatory dysfunction  Cellulitis of right lower extremity  Cellulitis of left lower extremity  Primary osteoarthritis of right knee  Acute cystitis without hematuria    ED Discharge Orders    None       Isla Pence, MD 11/26/16 2016

## 2016-11-27 ENCOUNTER — Inpatient Hospital Stay (HOSPITAL_COMMUNITY): Payer: Medicare Other

## 2016-11-27 ENCOUNTER — Other Ambulatory Visit: Payer: Self-pay

## 2016-11-27 DIAGNOSIS — I361 Nonrheumatic tricuspid (valve) insufficiency: Secondary | ICD-10-CM

## 2016-11-27 DIAGNOSIS — I340 Nonrheumatic mitral (valve) insufficiency: Secondary | ICD-10-CM

## 2016-11-27 DIAGNOSIS — W19XXXA Unspecified fall, initial encounter: Secondary | ICD-10-CM

## 2016-11-27 DIAGNOSIS — I351 Nonrheumatic aortic (valve) insufficiency: Secondary | ICD-10-CM

## 2016-11-27 DIAGNOSIS — L0390 Cellulitis, unspecified: Secondary | ICD-10-CM

## 2016-11-27 LAB — TSH: TSH: 5.688 u[IU]/mL — AB (ref 0.350–4.500)

## 2016-11-27 LAB — COMPREHENSIVE METABOLIC PANEL
ALK PHOS: 100 U/L (ref 38–126)
ALT: 25 U/L (ref 14–54)
ANION GAP: 13 (ref 5–15)
AST: 36 U/L (ref 15–41)
Albumin: 3.6 g/dL (ref 3.5–5.0)
BUN: 24 mg/dL — ABNORMAL HIGH (ref 6–20)
CALCIUM: 9.1 mg/dL (ref 8.9–10.3)
CHLORIDE: 96 mmol/L — AB (ref 101–111)
CO2: 28 mmol/L (ref 22–32)
CREATININE: 1.38 mg/dL — AB (ref 0.44–1.00)
GFR, EST AFRICAN AMERICAN: 37 mL/min — AB (ref >60)
GFR, EST NON AFRICAN AMERICAN: 32 mL/min — AB (ref >60)
Glucose, Bld: 112 mg/dL — ABNORMAL HIGH (ref 65–99)
Potassium: 3.4 mmol/L — ABNORMAL LOW (ref 3.5–5.1)
SODIUM: 137 mmol/L (ref 135–145)
Total Bilirubin: 1.3 mg/dL — ABNORMAL HIGH (ref 0.3–1.2)
Total Protein: 6.9 g/dL (ref 6.5–8.1)

## 2016-11-27 LAB — CBC
HCT: 38.8 % (ref 36.0–46.0)
HEMOGLOBIN: 12.9 g/dL (ref 12.0–15.0)
MCH: 34.2 pg — AB (ref 26.0–34.0)
MCHC: 33.2 g/dL (ref 30.0–36.0)
MCV: 102.9 fL — ABNORMAL HIGH (ref 78.0–100.0)
PLATELETS: 276 10*3/uL (ref 150–400)
RBC: 3.77 MIL/uL — AB (ref 3.87–5.11)
RDW: 14.5 % (ref 11.5–15.5)
WBC: 7.4 10*3/uL (ref 4.0–10.5)

## 2016-11-27 LAB — MRSA PCR SCREENING: MRSA BY PCR: NEGATIVE

## 2016-11-27 LAB — HEMOGLOBIN A1C
HEMOGLOBIN A1C: 5.8 % — AB (ref 4.8–5.6)
Mean Plasma Glucose: 119.76 mg/dL

## 2016-11-27 LAB — PREALBUMIN: PREALBUMIN: 15.3 mg/dL — AB (ref 18–38)

## 2016-11-27 LAB — PHOSPHORUS: Phosphorus: 3.5 mg/dL (ref 2.5–4.6)

## 2016-11-27 LAB — MAGNESIUM: MAGNESIUM: 1.9 mg/dL (ref 1.7–2.4)

## 2016-11-27 MED ORDER — ONDANSETRON HCL 4 MG/2ML IJ SOLN
4.0000 mg | Freq: Four times a day (QID) | INTRAMUSCULAR | Status: DC | PRN
  Administered 2016-11-27: 4 mg via INTRAVENOUS
  Filled 2016-11-27: qty 2

## 2016-11-27 MED ORDER — IRBESARTAN 150 MG PO TABS
75.0000 mg | ORAL_TABLET | Freq: Every day | ORAL | Status: DC
  Administered 2016-11-28 – 2016-11-30 (×3): 75 mg via ORAL
  Filled 2016-11-27 (×3): qty 1

## 2016-11-27 MED ORDER — VANCOMYCIN HCL IN DEXTROSE 1-5 GM/200ML-% IV SOLN: 1000.0000 mg | Freq: Once | INTRAVENOUS | Status: DC

## 2016-11-27 MED ORDER — ACETAMINOPHEN 325 MG PO TABS
650.0000 mg | ORAL_TABLET | Freq: Four times a day (QID) | ORAL | Status: DC | PRN
  Administered 2016-11-27 – 2016-11-29 (×4): 650 mg via ORAL
  Filled 2016-11-27 (×4): qty 2

## 2016-11-27 MED ORDER — IRBESARTAN 150 MG PO TABS
150.0000 mg | ORAL_TABLET | Freq: Every day | ORAL | Status: DC
  Administered 2016-11-27: 150 mg via ORAL
  Filled 2016-11-27: qty 1

## 2016-11-27 MED ORDER — POLYETHYLENE GLYCOL 3350 17 G PO PACK: 17.0000 g | PACK | Freq: Every day | ORAL | Status: DC | PRN

## 2016-11-27 MED ORDER — LETROZOLE 2.5 MG PO TABS
2.5000 mg | ORAL_TABLET | Freq: Every day | ORAL | Status: DC
  Administered 2016-11-27 – 2016-11-30 (×4): 2.5 mg via ORAL
  Filled 2016-11-27 (×4): qty 1

## 2016-11-27 MED ORDER — BISACODYL 10 MG RE SUPP
10.0000 mg | Freq: Every day | RECTAL | Status: DC | PRN
Start: 2016-11-27 — End: 2016-11-30

## 2016-11-27 MED ORDER — SODIUM CHLORIDE 0.9 % IV SOLN: 250.0000 mL | INTRAVENOUS | Status: DC | PRN

## 2016-11-27 MED ORDER — ATORVASTATIN CALCIUM 40 MG PO TABS
40.0000 mg | ORAL_TABLET | Freq: Every day | ORAL | Status: DC
  Administered 2016-11-27 – 2016-11-30 (×4): 40 mg via ORAL
  Filled 2016-11-27 (×4): qty 1

## 2016-11-27 MED ORDER — TRAMADOL HCL 50 MG PO TABS: 50.0000 mg | ORAL_TABLET | Freq: Three times a day (TID) | ORAL | Status: DC | PRN

## 2016-11-27 MED ORDER — APIXABAN 2.5 MG PO TABS
2.5000 mg | ORAL_TABLET | Freq: Two times a day (BID) | ORAL | Status: DC
  Administered 2016-11-27 – 2016-11-30 (×8): 2.5 mg via ORAL
  Filled 2016-11-27 (×8): qty 1

## 2016-11-27 MED ORDER — ENSURE ENLIVE PO LIQD
237.0000 mL | Freq: Two times a day (BID) | ORAL | Status: DC
  Administered 2016-11-27 – 2016-11-30 (×6): 237 mL via ORAL

## 2016-11-27 MED ORDER — FUROSEMIDE 40 MG PO TABS
40.0000 mg | ORAL_TABLET | Freq: Every day | ORAL | Status: DC
  Administered 2016-11-27 – 2016-11-30 (×4): 40 mg via ORAL
  Filled 2016-11-27 (×4): qty 1

## 2016-11-27 MED ORDER — DEXTROSE 5 % IV SOLN: 1.0000 g | INTRAVENOUS | Status: DC

## 2016-11-27 MED ORDER — HYDROCODONE-ACETAMINOPHEN 5-325 MG PO TABS
1.0000 | ORAL_TABLET | ORAL | Status: DC | PRN
  Administered 2016-11-27 – 2016-11-28 (×3): 1 via ORAL
  Filled 2016-11-27 (×3): qty 1

## 2016-11-27 MED ORDER — ALPRAZOLAM 0.25 MG PO TABS
0.2500 mg | ORAL_TABLET | Freq: Two times a day (BID) | ORAL | Status: DC | PRN
  Administered 2016-11-29 – 2016-11-30 (×3): 0.25 mg via ORAL
  Filled 2016-11-27 (×3): qty 1

## 2016-11-27 MED ORDER — SODIUM CHLORIDE 0.9% FLUSH
3.0000 mL | Freq: Two times a day (BID) | INTRAVENOUS | Status: DC
  Administered 2016-11-27 – 2016-11-30 (×7): 3 mL via INTRAVENOUS

## 2016-11-27 MED ORDER — ONDANSETRON HCL 4 MG PO TABS: 4.0000 mg | ORAL_TABLET | Freq: Four times a day (QID) | ORAL | Status: DC | PRN

## 2016-11-27 MED ORDER — AMIODARONE HCL 200 MG PO TABS
200.0000 mg | ORAL_TABLET | Freq: Every morning | ORAL | Status: DC
  Administered 2016-11-27 – 2016-11-30 (×4): 200 mg via ORAL
  Filled 2016-11-27 (×4): qty 1

## 2016-11-27 MED ORDER — VANCOMYCIN HCL 500 MG IV SOLR
500.0000 mg | INTRAVENOUS | Status: DC
  Administered 2016-11-28: 500 mg via INTRAVENOUS
  Filled 2016-11-27: qty 500

## 2016-11-27 MED ORDER — FLUTICASONE FUROATE-VILANTEROL 200-25 MCG/INH IN AEPB
1.0000 | INHALATION_SPRAY | Freq: Every day | RESPIRATORY_TRACT | Status: DC
  Administered 2016-11-27 – 2016-11-30 (×4): 1 via RESPIRATORY_TRACT
  Filled 2016-11-27: qty 28

## 2016-11-27 MED ORDER — ACETAMINOPHEN 650 MG RE SUPP: 650.0000 mg | Freq: Four times a day (QID) | RECTAL | Status: DC | PRN

## 2016-11-27 MED ORDER — SODIUM CHLORIDE 0.9% FLUSH: 3.0000 mL | INTRAVENOUS | Status: DC | PRN

## 2016-11-27 MED ORDER — DEXTROSE 5 % IV SOLN
2.0000 g | INTRAVENOUS | Status: DC
  Administered 2016-11-27 – 2016-11-29 (×3): 2 g via INTRAVENOUS
  Filled 2016-11-27 (×3): qty 2

## 2016-11-27 MED ORDER — ADULT MULTIVITAMIN W/MINERALS CH
1.0000 | ORAL_TABLET | Freq: Every day | ORAL | Status: DC
  Administered 2016-11-27 – 2016-11-30 (×4): 1 via ORAL
  Filled 2016-11-27 (×4): qty 1

## 2016-11-27 MED ORDER — ORAL CARE MOUTH RINSE
15.0000 mL | Freq: Two times a day (BID) | OROMUCOSAL | Status: DC
  Administered 2016-11-27 – 2016-11-29 (×6): 15 mL via OROMUCOSAL

## 2016-11-27 MED ORDER — POTASSIUM CHLORIDE CRYS ER 20 MEQ PO TBCR
20.0000 meq | EXTENDED_RELEASE_TABLET | Freq: Every day | ORAL | Status: DC
  Administered 2016-11-27 – 2016-11-30 (×4): 20 meq via ORAL
  Filled 2016-11-27 (×4): qty 1

## 2016-11-27 MED ORDER — SENNA 8.6 MG PO TABS
1.0000 | ORAL_TABLET | Freq: Two times a day (BID) | ORAL | Status: DC
  Administered 2016-11-27 – 2016-11-28 (×4): 8.6 mg via ORAL
  Filled 2016-11-27 (×6): qty 1

## 2016-11-27 NOTE — ED Notes (Signed)
ED TO INPATIENT HANDOFF REPORT  Name/Age/Gender Holly Weaver 83 y.o. female  Code Status Code Status History    Date Active Date Inactive Code Status Order ID Comments User Context   09/11/2014 07:07 09/15/2014 16:55 Full Code [id number]  Lavina Hamman, MD Inpatient   10/08/2012 23:07 10/14/2012 16:41 Full Code [id number]  Leanna Battles, MD ED   09/21/2012 17:02 09/24/2012 18:20 Full Code [id number]  Newt Minion, MD Inpatient   09/21/2012 07:59 09/21/2012 17:02 Full Code [id number]  Berle Mull, MD Inpatient    Advance Directive Documentation     Most Recent Value  Type of Advance Directive  Healthcare Power of Attorney  Pre-existing out of facility DNR order (yellow form or pink MOST form)  No data  "MOST" Form in Place?  No data      Home/SNF/Other Home  Chief Complaint diarrhea  Level of Care/Admitting Diagnosis ED Disposition    ED Disposition Condition [facility] Area: [place] [[id number]]  Level of Care: Telemetry [5]  Admit to tele based on following criteria: Other see comments  Comments: a. fib with tachycardia  Diagnosis: Cellulitis [[id number]]  Admitting Physician: Toy Baker [[id number]]  Attending Physician: Toy Baker [[id number]]  Estimated length of stay: 3 - 4 days  Certification:: I certify this patient will need inpatient services for at least 2 midnights  PT Class (Do Not Modify): Inpatient [101]  PT Acc Code (Do Not Modify): Private [1]       Medical History Past Medical History:  Diagnosis Date  . Arthritis    "hands and legs" (10/09/2012)  . Atrial fibrillation ([place])   . Breast cancer ([place]) ~ 2006   "left" (10/09/2012)  . Cervical spondylolysis 09/21/2012  . Chronic lower back pain   . Compression fracture 09/21/2012  . Dyslipidemia 09/21/2012  . Heart murmur   . History of blood transfusion    "years and years ago; I was anemic" (10/09/2012)  . Hypertension   . MVP (mitral valve prolapse) 09/23/2012   . Osteoarthritis 09/21/2012  . Osteoporosis   . Scoliosis   . Shortness of breath    "just now; nose is dry" (10/09/2012)    Allergies Allergies  Allergen Reactions  . Codeine Nausea And Vomiting    IV Location/Drains/Wounds Patient Lines/Drains/Airways Status   Active Line/Drains/Airways    Name:   Placement date:   Placement time:   Site:   Days:   Peripheral IV 11/26/16 Left Antecubital   11/26/16    1532    Antecubital   1   Peripheral IV 11/26/16 Anterior;Proximal;Right Arm   11/26/16    1833    Arm   1   Pressure Ulcer 09/11/14 Stage II -  Partial thickness loss of dermis presenting as a shallow open ulcer with a red, pink wound bed without slough. open skin no drainage    09/11/14    0813     808          Labs/Imaging Results for orders placed or performed during the hospital encounter of 11/26/16 (from the past 48 hour(s))  Comprehensive metabolic panel     Status: Abnormal   Collection Time: 11/26/16  3:29 PM  Result Value Ref Range   Sodium 134 (L) 135 - 145 mmol/L   Potassium 3.9 3.5 - 5.1 mmol/L   Chloride 95 (L) 101 - 111 mmol/L   CO2 27 22 - 32 mmol/L   Glucose, Bld 117 (H) 65 - 99  mg/dL   BUN 28 (H) 6 - 20 mg/dL   Creatinine, Ser 1.41 (H) 0.44 - 1.00 mg/dL   Calcium 9.2 8.9 - 10.3 mg/dL   Total Protein 7.0 6.5 - 8.1 g/dL   Albumin 3.8 3.5 - 5.0 g/dL   AST 35 15 - 41 U/L   ALT 26 14 - 54 U/L   Alkaline Phosphatase 105 38 - 126 U/L   Total Bilirubin 1.1 0.3 - 1.2 mg/dL   GFR calc non Af Amer 32 (L) >60 mL/min   GFR calc Af Amer 37 (L) >60 mL/min    Comment: (NOTE) The eGFR has been calculated using the CKD EPI equation. This calculation has not been validated in all clinical situations. eGFR's persistently <60 mL/min signify possible Chronic Kidney Disease.    Anion gap 12 5 - 15  Lipase, blood     Status: None   Collection Time: 11/26/16  3:29 PM  Result Value Ref Range   Lipase 25 11 - 51 U/L  CBC with Differential     Status: Abnormal    Collection Time: 11/26/16  3:29 PM  Result Value Ref Range   WBC 8.1 4.0 - 10.5 K/uL   RBC 3.82 (L) 3.87 - 5.11 MIL/uL   Hemoglobin 13.3 12.0 - 15.0 g/dL   HCT 39.4 36.0 - 46.0 %   MCV 103.1 (H) 78.0 - 100.0 fL   MCH 34.8 (H) 26.0 - 34.0 pg   MCHC 33.8 30.0 - 36.0 g/dL   RDW 14.3 11.5 - 15.5 %   Platelets 270 150 - 400 K/uL   Neutrophils Relative % 85 %   Neutro Abs 6.9 1.7 - 7.7 K/uL   Lymphocytes Relative 7 %   Lymphs Abs 0.6 (L) 0.7 - 4.0 K/uL   Monocytes Relative 8 %   Monocytes Absolute 0.7 0.1 - 1.0 K/uL   Eosinophils Relative 0 %   Eosinophils Absolute 0.0 0.0 - 0.7 K/uL   Basophils Relative 0 %   Basophils Absolute 0.0 0.0 - 0.1 K/uL  I-Stat CG4 Lactic Acid, ED     Status: None   Collection Time: 11/26/16  4:03 PM  Result Value Ref Range   Lactic Acid, Venous 1.88 0.5 - 1.9 mmol/L  Urinalysis, Routine w reflex microscopic     Status: Abnormal   Collection Time: 11/26/16  7:10 PM  Result Value Ref Range   Color, Urine YELLOW YELLOW   APPearance HAZY (A) CLEAR   Specific Gravity, Urine 1.008 1.005 - 1.030   pH 7.0 5.0 - 8.0   Glucose, UA NEGATIVE NEGATIVE mg/dL   Hgb urine dipstick LARGE (A) NEGATIVE   Bilirubin Urine NEGATIVE NEGATIVE   Ketones, ur NEGATIVE NEGATIVE mg/dL   Protein, ur NEGATIVE NEGATIVE mg/dL   Nitrite NEGATIVE NEGATIVE   Leukocytes, UA LARGE (A) NEGATIVE   RBC / HPF 6-30 0 - 5 RBC/hpf   WBC, UA TOO NUMEROUS TO COUNT 0 - 5 WBC/hpf   Bacteria, UA FEW (A) NONE SEEN   Squamous Epithelial / LPF 0-5 (A) NONE SEEN   Mucus PRESENT    Dg Chest 2 View  Result Date: 11/26/2016 CLINICAL DATA:  UTI, weakness, history breast cancer, hypertension, atrial fibrillation EXAM: CHEST  2 VIEW COMPARISON:  09/13/2014 FINDINGS: Enlargement of cardiac silhouette. Atherosclerotic calcification aorta. Pulmonary vascularity normal. Moderate-sized hiatal hernia again seen. Lungs appear emphysematous but clear. No acute infiltrate, pleural effusion or pneumothorax. Diffuse  osseous demineralization with evidence of prior vertebroplasties at the thoracolumbar junction. Scattered  costal cartilaginous calcifications. IMPRESSION: Enlargement of cardiac silhouette. Moderate-sized hiatal hernia. Emphysematous changes without definite infiltrate. Electronically Signed   By: Lavonia Dana M.D.   On: 11/26/2016 17:11   Ct Abdomen Pelvis W Contrast  Result Date: 11/26/2016 CLINICAL DATA:  Rash left flank 2 weeks. EXAM: CT ABDOMEN AND PELVIS WITH CONTRAST TECHNIQUE: Multidetector CT imaging of the abdomen and pelvis was performed using the standard protocol following bolus administration of intravenous contrast. CONTRAST:  143m ISOVUE-300 IOPAMIDOL (ISOVUE-300) INJECTION 61% COMPARISON:  CT abdomen/pelvis 12/12/2006 and CT chest 04/12/2015 FINDINGS: Lower chest: Minimal bibasilar atelectasis/scarring. Calcification of the mitral valve annulus. Mild cardiomegaly. Moderate size hiatal hernia. Hepatobiliary: Within normal. Pancreas: Within normal. Spleen: Within normal. Adrenals/Urinary Tract: Adrenal glands are normal. Kidneys are within normal in size. No hydronephrosis or nephrolithiasis. 2 cm homogeneously hypodense mass over the upper pole left renal cortex likely hyperdense cysts. Ureters are within normal. There is moderate bladder distention. Stomach/Bowel: Moderate size hiatal hernia as the stomach is otherwise unremarkable. Small bowel is within normal. Appendix is normal. Moderate fecal retention throughout the colon. Vascular/Lymphatic: Moderate calcified plaque over the abdominal aorta and iliac arteries. No adenopathy. Reproductive: Unremarkable. Other: No free fluid or focal inflammatory change. Musculoskeletal: Hardware intact over the left femur. Bilateral degenerative changes of the hips. Severe degenerative change of the spine with curvature of the lumbar spine convex right. Previous kyphoplasties of T11 and T12 compression fractures. Moderate L1 compression fracture unchanged.  IMPRESSION: No acute findings in the abdomen/pelvis. Mild to moderate bladder distention. Moderate size hiatal hernia. Aortic Atherosclerosis (ICD10-I70.0). 2 cm hypodensity over the upper pole left renal cortex indeterminate but likely a hyperdense cyst. Recommend followup ultrasound on elective basis. Stable compression fractures. Electronically Signed   By: DMarin OlpM.D.   On: 11/26/2016 19:20   Dg Knee Complete 4 Views Right  Result Date: 11/26/2016 CLINICAL DATA:  Knee pain and weakness, UTI EXAM: RIGHT KNEE - COMPLETE 4+ VIEW COMPARISON:  11/07/2016 FINDINGS: Marked osseous demineralization. Advanced tricompartmental osteoarthritic changes with joint space narrowing greatest at lateral compartment. Small spurs at lateral compartment and patellofemoral joint. Mildly depressed fracture of the medial tibial plateau with associated small knee joint effusion. No additional fracture, dislocation, or bone destruction. Atherosclerotic calcifications of the superficial femoral and popliteal arteries. Calcifications at the suprapatellar region question muscular. IMPRESSION: Osseous demineralization with a depressed fracture of the RIGHT medial tibial plateau and small knee joint effusion. Degenerative changes RIGHT knee. Electronically Signed   By: MLavonia DanaM.D.   On: 11/26/2016 17:10    Pending Labs Unresulted Labs (From admission, onward)   Start     Ordered   11/26/16 2322  Urine Culture  Once,   R     11/26/16 2321   Signed and Held  Prealbumin  Tomorrow morning,   R     Signed and Held   Signed and Held  Magnesium  Tomorrow morning,   R    Comments:  Call MD if <1.5    Signed and Held   Signed and Held  Phosphorus  Tomorrow morning,   R     Signed and Held   Signed and Held  TSH  Once,   R    Comments:  Cancel if already done within 1 month and notify MD    Signed and Held   Signed and Held  Comprehensive metabolic panel  Once,   R    Comments:  Cal MD for K<3.5 or >5.0  Signed  and Held   Signed and Held  CBC  Once,   R    Comments:  Call for hg <8.0    Signed and Held   Signed and Held  Hemoglobin A1c  Tomorrow morning,   R    Comments:  Cancel if has been done within past month and notify MD    Signed and Held      Vitals/Pain Today's Vitals   11/26/16 2245 11/26/16 2300 11/26/16 2302 11/26/16 2315  BP:      Pulse: (!) 107 (!) 103  (!) 109  Resp: (!) 21 (!) 23  (!) 24  Temp:      TempSrc:      SpO2: 98% 99%  98%  PainSc:   9      Isolation Precautions No active isolations  Medications Medications  sodium chloride 0.9 % bolus 1,000 mL (0 mLs Intravenous Stopped 11/26/16 1911)  vancomycin (VANCOCIN) IVPB 1000 mg/200 mL premix (0 mg Intravenous Stopped 11/26/16 1727)  iopamidol (ISOVUE-300) 61 % injection (100 mLs  Contrast Given 11/26/16 1826)  cefTRIAXone (ROCEPHIN) 2 g in dextrose 5 % 50 mL IVPB (2 g Intravenous New Bag/Given 11/26/16 2315)  HYDROcodone-acetaminophen (NORCO/VICODIN) 5-325 MG per tablet 1 tablet (1 tablet Oral Given 11/26/16 2205)    Mobility Non-ambulatory

## 2016-11-27 NOTE — Progress Notes (Addendum)
PROGRESS NOTE  Holly Weaver  LTJ:[id number] DOB: May 05, 1923 DOA: 11/26/2016 PCP: [name], MD   Brief Narrative: Holly Weaver is a 83 y.o. female with a history of AFib, CAD, HLD, HTN, chronic HFpEF, malnutrition, and venous insufficiency with leg wounds who presented to the ED with multiple complaints including diarrhea following laxative use, painful leg wound, and frequent falls. She was found to have symptomatic pyuria, generalized weakness, and lower extremity cellulitis. She was admitted for antibiotics, wound care, and PT evaluation.   Assessment & Plan: Active Problems:   PAF (paroxysmal atrial fibrillation), maintaining SR   Hypertension   CAD in native artery, non obstructive by cath 2000   Protein-calorie malnutrition, severe (HCC)   Acute lower UTI   Falls   Cellulitis  Cellulitis and chronic venous insufficiency with lower extremity wounds/poor wound healing:  - Elevate extremities, prevalon boots for offloading - Appreciate WOC recommendations. I do not feel this wound needs debridement now. ?Would benefit from unna's boot.  - ABI's ordered to investigate arterial disease, though I palpate fair pulses bilaterally.  - Continue IV antibiotics: ceftriaxone, vancomycin (renally dosed per pharmacy q48h) - Pain control with home hydrocodone, tramadol.  Acute lower UTI:  - Antibiotics as above - Monitor urine culture  Chronic HFpEF:  - Continue lasix 40mg  po daily - Update echocardiogram  CKD stage IV: Though creatinine only modestly elevated, 1.41 on admission and stable since, this puts CrCl at 51ml/min, and historic creatinine is also elevated.  - Will need to be mindful regarding nephrotoxins, medication dosing.  - Monitor BMP in AM  CAD in native artery: Non-obstructive by cath 2000: No chest pain or anginal equivalents.  - Plan to monitor and continue home medications including statin.  Hypertension: chronic, stable - Continue ARB, though BPs  low-normal, will decrease dose of formulary equivalent for tmrw AM  Paroxysmal atrial fibrillation: CHA2DS2-VASc score is 5. Currently rate-controlled but has reverted to AFib. - Continue eliquis   - Continue amiodarone.   Severe protein-calorie malnutrition:  - Nutrition consulted, BID protein supplement  COPD: Stable.  - Continue breo ellipta  Anxiety: Chronic, stable - Continue low dose po xanax, though with frequent falling, would strongly consider discontinuing this if able.   Right medial tibial plateau fracture: Mildly depressed, not present on XR 10/17. She has no point tenderness on exam.  - Discussed with Dr. Durward Fortes, [place] (where she's seen for longstanding, severe OA). Recommended no advanced imaging at this time. They will plan to consult formally 11/7.   Frequent falls at home: Reports she has been bedbound over the past couple weeks due to worsening right knee pain.  - PT evaluation ordered, daughter no longer feels that she can care for the patient at home.  Elevated TSH: 5.688.  - Will check free T3, free T4.  - Would recommend recheck in 4 - 6 weeks.   DVT prophylaxis: Eliquis Code Status: Full Family Communication: None at bedside on rounds this AM Disposition Plan: Per therapy evaluations  Consultants:   None  Procedures:   ABIs, echocardiogram ordered  Antimicrobials:  Ceftriaxone  Vancomycin   Subjective: In high spirits, feels better today. No watery stools or fever. Knee pain controlled.  Objective: BP 105/61 (BP Location: Right Arm)   Pulse 85   Temp 97.8 F (36.6 C) (Oral)   Resp 20   Ht 5\' 4"  (1.626 m)   Wt 44.9 kg (98 lb 15.8 oz)   SpO2 98%   BMI 16.99  kg/m   Gen: Pleasant, elderly female in no distress Pulm: Non-labored breathing room air. Clear to auscultation bilaterally.  CV: Irreg irreg w/rate avg ~80bpm. II/VI SEM at base. No JVD. GI: Abdomen soft, non-tender, non-distended, with normoactive bowel sounds.  No organomegaly or masses felt. Ext: Warm, knees with bony enlargement bilaterally, no point tenderness with attention to right medial joint line.  Skin: RLE with trace pitting edema and erythema extending from foot to the knee with an annular wound 2in diameter on the anterior/medial lower leg with slough but no definite purulence, tender and warm. Left anterior lower leg with smaller eschar with contracted wound edges. (Not significantly changed from pictures in H&P on admission) Neuro: Alert and oriented. No focal neurological deficits. Psych: Judgement and insight appear normal. Mood & affect appropriate.   CBC: Recent Labs  Lab 11/26/16 1529 11/27/16 0527  WBC 8.1 7.4  NEUTROABS 6.9  --   HGB 13.3 12.9  HCT 39.4 38.8  MCV 103.1* 102.9*  PLT 270 338   Basic Metabolic Panel: Recent Labs  Lab 11/26/16 1529 11/27/16 0527  NA 134* 137  K 3.9 3.4*  CL 95* 96*  CO2 27 28  GLUCOSE 117* 112*  BUN 28* 24*  CREATININE 1.41* 1.38*  CALCIUM 9.2 9.1  MG  --  1.9  PHOS  --  3.5   GFR: Estimated Creatinine Clearance: 18.8 mL/min (A) (by C-G formula based on SCr of 1.38 mg/dL (H)). Liver Function Tests: Recent Labs  Lab 11/26/16 1529 11/27/16 0527  AST 35 36  ALT 26 25  ALKPHOS 105 100  BILITOT 1.1 1.3*  PROT 7.0 6.9  ALBUMIN 3.8 3.6   Recent Labs  Lab 11/26/16 1529  LIPASE 25   HbA1C: Recent Labs    11/27/16 0527  HGBA1C 5.8*   Thyroid Function Tests: Recent Labs    11/27/16 0527  TSH 5.688*   Urine analysis:    Component Value Date/Time   COLORURINE YELLOW 11/26/2016 1910   APPEARANCEUR HAZY (A) 11/26/2016 1910   LABSPEC 1.008 11/26/2016 1910   PHURINE 7.0 11/26/2016 1910   GLUCOSEU NEGATIVE 11/26/2016 1910   HGBUR LARGE (A) 11/26/2016 1910   BILIRUBINUR NEGATIVE 11/26/2016 1910   KETONESUR NEGATIVE 11/26/2016 1910   PROTEINUR NEGATIVE 11/26/2016 1910   UROBILINOGEN 1.0 09/11/2014 0111   NITRITE NEGATIVE 11/26/2016 1910   LEUKOCYTESUR LARGE (A)  11/26/2016 1910   Recent Results (from the past 240 hour(s))  MRSA PCR Screening     Status: None   Collection Time: 11/27/16  1:52 AM  Result Value Ref Range Status   MRSA by PCR NEGATIVE NEGATIVE Final    Comment:        The GeneXpert MRSA Assay (FDA approved for NASAL specimens only), is one component of a comprehensive MRSA colonization surveillance program. It is not intended to diagnose MRSA infection nor to guide or monitor treatment for MRSA infections.       Radiology Studies: Dg Chest 2 View  Result Date: 11/26/2016 CLINICAL DATA:  UTI, weakness, history breast cancer, hypertension, atrial fibrillation EXAM: CHEST  2 VIEW COMPARISON:  09/13/2014 FINDINGS: Enlargement of cardiac silhouette. Atherosclerotic calcification aorta. Pulmonary vascularity normal. Moderate-sized hiatal hernia again seen. Lungs appear emphysematous but clear. No acute infiltrate, pleural effusion or pneumothorax. Diffuse osseous demineralization with evidence of prior vertebroplasties at the thoracolumbar junction. Scattered costal cartilaginous calcifications. IMPRESSION: Enlargement of cardiac silhouette. Moderate-sized hiatal hernia. Emphysematous changes without definite infiltrate. Electronically Signed   By: Crist Infante.D.  On: 11/26/2016 17:11   Ct Abdomen Pelvis W Contrast  Result Date: 11/26/2016 CLINICAL DATA:  Rash left flank 2 weeks. EXAM: CT ABDOMEN AND PELVIS WITH CONTRAST TECHNIQUE: Multidetector CT imaging of the abdomen and pelvis was performed using the standard protocol following bolus administration of intravenous contrast. CONTRAST:  173mL ISOVUE-300 IOPAMIDOL (ISOVUE-300) INJECTION 61% COMPARISON:  CT abdomen/pelvis 12/12/2006 and CT chest 04/12/2015 FINDINGS: Lower chest: Minimal bibasilar atelectasis/scarring. Calcification of the mitral valve annulus. Mild cardiomegaly. Moderate size hiatal hernia. Hepatobiliary: Within normal. Pancreas: Within normal. Spleen: Within normal.  Adrenals/Urinary Tract: Adrenal glands are normal. Kidneys are within normal in size. No hydronephrosis or nephrolithiasis. 2 cm homogeneously hypodense mass over the upper pole left renal cortex likely hyperdense cysts. Ureters are within normal. There is moderate bladder distention. Stomach/Bowel: Moderate size hiatal hernia as the stomach is otherwise unremarkable. Small bowel is within normal. Appendix is normal. Moderate fecal retention throughout the colon. Vascular/Lymphatic: Moderate calcified plaque over the abdominal aorta and iliac arteries. No adenopathy. Reproductive: Unremarkable. Other: No free fluid or focal inflammatory change. Musculoskeletal: Hardware intact over the left femur. Bilateral degenerative changes of the hips. Severe degenerative change of the spine with curvature of the lumbar spine convex right. Previous kyphoplasties of T11 and T12 compression fractures. Moderate L1 compression fracture unchanged. IMPRESSION: No acute findings in the abdomen/pelvis. Mild to moderate bladder distention. Moderate size hiatal hernia. Aortic Atherosclerosis (ICD10-I70.0). 2 cm hypodensity over the upper pole left renal cortex indeterminate but likely a hyperdense cyst. Recommend followup ultrasound on elective basis. Stable compression fractures. Electronically Signed   By: Marin Olp M.D.   On: 11/26/2016 19:20   Dg Knee Complete 4 Views Right  Result Date: 11/26/2016 CLINICAL DATA:  Knee pain and weakness, UTI EXAM: RIGHT KNEE - COMPLETE 4+ VIEW COMPARISON:  11/07/2016 FINDINGS: Marked osseous demineralization. Advanced tricompartmental osteoarthritic changes with joint space narrowing greatest at lateral compartment. Small spurs at lateral compartment and patellofemoral joint. Mildly depressed fracture of the medial tibial plateau with associated small knee joint effusion. No additional fracture, dislocation, or bone destruction. Atherosclerotic calcifications of the superficial femoral and  popliteal arteries. Calcifications at the suprapatellar region question muscular. IMPRESSION: Osseous demineralization with a depressed fracture of the RIGHT medial tibial plateau and small knee joint effusion. Degenerative changes RIGHT knee. Electronically Signed   By: Lavonia Dana M.D.   On: 11/26/2016 17:10    Scheduled Meds: . amiodarone  200 mg Oral q morning - 10a  . apixaban  2.5 mg Oral BID  . atorvastatin  40 mg Oral Daily  . feeding supplement (ENSURE ENLIVE)  237 mL Oral BID BM  . fluticasone furoate-vilanterol  1 puff Inhalation Daily  . furosemide  40 mg Oral Daily  . irbesartan  150 mg Oral Daily  . letrozole  2.5 mg Oral Daily  . mouth rinse  15 mL Mouth Rinse BID  . multivitamin with minerals  1 tablet Oral Daily  . potassium chloride  20 mEq Oral Daily  . senna  1 tablet Oral BID  . sodium chloride flush  3 mL Intravenous Q12H   Continuous Infusions: . sodium chloride    . cefTRIAXone (ROCEPHIN)  IV    . [START ON 11/28/2016] vancomycin       LOS: 1 day   Time spent: 25 minutes.  Vance Gather, MD [facility] Pager [phone]  If 7PM-7AM, please contact night-coverage [email or web address] Password [email or web address] 11/27/2016, 2:31 PM

## 2016-11-27 NOTE — Progress Notes (Signed)
Initial Nutrition Assessment  DOCUMENTATION CODES:   Severe malnutrition in context of chronic illness, Underweight  INTERVENTION:   -Continue Ensure Enlive po BID, each supplement provides 350 kcal and 20 grams of protein -Provide Multivitamin with minerals daily -Encourage PO intake -RD will continue to monitor  NUTRITION DIAGNOSIS:   Severe Malnutrition related to wound healing, poor appetite, nausea, chronic illness as evidenced by energy intake < or equal to 75% for > or equal to 1 month, severe fat depletion, severe muscle depletion.  GOAL:   Patient will meet greater than or equal to 90% of their needs  MONITOR:   PO intake, Supplement acceptance, Labs, Weight trends, Skin, I & O's  REASON FOR ASSESSMENT:   Consult Assessment of nutrition requirement/status, Malnutrition Eval  ASSESSMENT:   83 y.o. female with medical history significant of  arthritis, history of atrial fibrillation, history of PE, diastolic CHF protein, and malnutrition, remote breast cancer, CAD, HLD, HTN,  Patient reports poor appetite for months now. Pt states she has nausea that "comes and goes" which prevents her from eating much at meal times. Over the past couple of weeks she has eaten poorly and was not drinking Ensure which she likes.  Pt with Ensure bottle at bedside, pt states she ate her breakfast this morning and drank 1/2 the bottle of Ensure and started to become nauseous. Received a dose of Zofran, states she feels better now. Plans to drink the rest of her Ensure. States she needs to have a bowel movement. Denies issues swallowing or chewing. States some foods "don't taste the same".   Per chart review, pt has lost 6 lb since June 2018. Per records, pt's weight fluctuates between 98-107 lb since 2016.  Medications: Lasix tablet daily, K-DUR tablet daily, Senokot daily, IV Zofran PRN Labs reviewed: Low K Mg/Phos WNL GFR: 32   NUTRITION - FOCUSED PHYSICAL EXAM:    Most Recent  Value  Orbital Region  Moderate depletion  Upper Arm Region  Severe depletion  Thoracic and Lumbar Region  Unable to assess  Buccal Region  Moderate depletion  Temple Region  Moderate depletion  Clavicle Bone Region  Severe depletion  Clavicle and Acromion Bone Region  Severe depletion  Scapular Bone Region  Severe depletion  Dorsal Hand  Severe depletion  Patellar Region  Unable to assess [wounds]  Anterior Thigh Region  Unable to assess  Posterior Calf Region  Unable to assess  Edema (RD Assessment)  None  Skin  Reviewed  Nails  Reviewed       Diet Order:  Diet Heart Room service appropriate? Yes; Fluid consistency: Thin  EDUCATION NEEDS:   Education needs have been addressed  Skin:  Skin Assessment: Skin Integrity Issues: Skin Integrity Issues:: Other (Comment), Stage II Stage II: sacrum Other: lower leg wounds  Last BM:  11/5  Height:   Ht Readings from Last 1 Encounters:  11/27/16 5\' 4"  (1.626 m)    Weight:   Wt Readings from Last 1 Encounters:  11/27/16 98 lb 15.8 oz (44.9 kg)    Ideal Body Weight:  54.5 kg  BMI:  Body mass index is 16.99 kg/m.  Estimated Nutritional Needs:   Kcal:  1200-1400  Protein:  60-70g  Fluid:  1.4L/day   Clayton Bibles, MS, RD, LDN [facility] Diet[facility] Pager: [phone] After Hours Pager: [phone]

## 2016-11-27 NOTE — Progress Notes (Addendum)
Pharmacy Antibiotic Note  RACQUEL ARKIN is a 83 y.o. female admitted on 11/26/2016 with cellulitis/ulcers.  Pharmacy has been consulted for Vancomycin dosing.  Plan: Vancomycin 500mg  IV every 48 hours.  Goal AUC = 400 - 500 for all indications, except meningitis (goal AUC > 500 and Cmin 15-20 mcg/mL)  Height: 5\' 4"  (162.6 cm) Weight: 98 lb 15.8 oz (44.9 kg) IBW/kg (Calculated) : 54.7  Temp (24hrs), Avg:98.7 F (37.1 C), Min:98.6 F (37 C), Max:98.7 F (37.1 C)  Recent Labs  Lab 11/26/16 1529 11/26/16 1603  WBC 8.1  --   CREATININE 1.41*  --   LATICACIDVEN  --  1.88    Estimated Creatinine Clearance: 18.4 mL/min (A) (by C-G formula based on SCr of 1.41 mg/dL (H)).    Allergies  Allergen Reactions  . Codeine Nausea And Vomiting    Antimicrobials this admission: Vancomycin 11/26/2016 >> Ceftriaxone11/05/2016 >>   Dose adjustments this admission: -  Microbiology results: pending  Thank you for allowing pharmacy to be a part of this patient's care.  Nani Skillern Crowford 11/27/2016 2:33 AM

## 2016-11-27 NOTE — Care Management Note (Signed)
Case Management Note  Patient Details  Name: SHER SHAMPINE MRN: [id number] Date of Birth: 1923-03-22  Subjective/Objective: 83 y/o f admitted w/Acute lower UTI. From home. Hx: bilateral leg wounds. PT cons-await recc. CSW notified & following.                  Action/Plan:d/c plan SNF.   Expected Discharge Date:  (unknown)               Expected Discharge Plan:  Skilled Nursing Facility  In-House Referral:  Clinical Social Work  Discharge planning Services  CM Consult  Post Acute Care Choice:    Choice offered to:     DME Arranged:    DME Agency:     HH Arranged:    [facility] Agency:     Status of Service:  In process, will continue to follow  If discussed at Long Length of Stay Meetings, dates discussed:    Additional Comments:  Dessa Phi, RN 11/27/2016, 1:44 PM

## 2016-11-27 NOTE — Consult Note (Signed)
Joni Fears, MD   Biagio Borg, PA-C [address], [place], [place]  [address]                             [phone]   ORTHOPAEDIC CONSULTATION  Holly Weaver            MRN:  [id number] DOB/SEX:  03/03/1923/female    REQUESTING PHYSICIAN:    CHIEF COMPLAINT:  Painful right knee HISTORY: Holly Weaver is a 83 y.o. female with a related 2-3 week history of right knee pain after a fall in her bathroom(poor historian). Has been experiencing pain, swelling and compromised ambulation.Films performed yesterday demonstrate a slightly depressed medial tibial plateau fracture. Admitted to the hospitalist service for treatment of multiple medical issues.  PAST MEDICAL HISTORY: Patient Active Problem List   Diagnosis Date Noted  . Acute lower UTI 11/26/2016  . Falls 11/26/2016  . Cellulitis 11/26/2016  . Osteopenia 12/30/2015  . Atelectasis 08/30/2015  . Dyspnea on exertion 04/04/2015  . Chronic pulmonary embolism ([place]) 04/04/2015  . Acute respiratory failure with hypoxia ([place]) 09/14/2014  . Lobar pneumonia due to unspecified organism 09/14/2014  . Protein-calorie malnutrition, severe ([place]) 09/14/2014  . Pressure ulcer 09/11/2014  . Breast cancer of upper-outer quadrant of left female breast ([place]) 05/26/2013  . CAD in native artery, non obstructive by cath 2000 09/23/2012  . Dyslipidemia 09/21/2012  . Hypertension 09/21/2012  . PAF (paroxysmal atrial fibrillation), maintaining SR 05/10/2012  . Long term current use of anticoagulant therapy 05/10/2012   Past Medical History:  Diagnosis Date  . Arthritis    "hands and legs" (10/09/2012)  . Atrial fibrillation ([place])   . Breast cancer ([place]) ~ 2006   "left" (10/09/2012)  . Cervical spondylolysis 09/21/2012  . Chronic lower back pain   . Compression fracture 09/21/2012  . Dyslipidemia 09/21/2012  . Heart murmur   . History of blood transfusion    "years and years ago; I was anemic" (10/09/2012)  . Hypertension     . MVP (mitral valve prolapse) 09/23/2012  . Osteoarthritis 09/21/2012  . Osteoporosis   . Scoliosis   . Shortness of breath    "just now; nose is dry" (10/09/2012)   Past Surgical History:  Procedure Laterality Date  . BREAST BIOPSY Left ~ 2006  . CARDIAC CATHETERIZATION  11/1998  . CATARACT EXTRACTION W/ INTRAOCULAR LENS IMPLANT Left 2000's  . HERNIA REPAIR  1950's?    MEDICATIONS:   Current Facility-Administered Medications:  .  0.9 %  sodium chloride infusion, 250 mL, Intravenous, PRN, Roel Cluck, Anastassia, MD .  acetaminophen (TYLENOL) tablet 650 mg, 650 mg, Oral, Q6H PRN, 650 mg at 11/27/16 0906 **OR** acetaminophen (TYLENOL) suppository 650 mg, 650 mg, Rectal, Q6H PRN, Doutova, Anastassia, MD .  ALPRAZolam Duanne Moron) tablet 0.25 mg, 0.25 mg, Oral, BID PRN, Doutova, Anastassia, MD .  amiodarone (PACERONE) tablet 200 mg, 200 mg, Oral, q morning - 10a, Doutova, Anastassia, MD, 200 mg at 11/27/16 1001 .  apixaban (ELIQUIS) tablet 2.5 mg, 2.5 mg, Oral, BID, Doutova, Anastassia, MD, 2.5 mg at 11/27/16 1007 .  atorvastatin (LIPITOR) tablet 40 mg, 40 mg, Oral, Daily, Doutova, Anastassia, MD, 40 mg at 11/27/16 1001 .  bisacodyl (DULCOLAX) suppository 10 mg, 10 mg, Rectal, Daily PRN, Doutova, Anastassia, MD .  cefTRIAXone (ROCEPHIN) 2 g in dextrose 5 % 50 mL IVPB, 2 g, Intravenous, Q24H, Doutova, Anastassia, MD .  feeding supplement (ENSURE ENLIVE) (ENSURE ENLIVE) liquid 237  mL, 237 mL, Oral, BID BM, Doutova, Anastassia, MD, 237 mL at 11/27/16 1356 .  fluticasone furoate-vilanterol (BREO ELLIPTA) 200-25 MCG/INH 1 puff, 1 puff, Inhalation, Daily, Doutova, Anastassia, MD, 1 puff at 11/27/16 0809 .  furosemide (LASIX) tablet 40 mg, 40 mg, Oral, Daily, Doutova, Anastassia, MD, 40 mg at 11/27/16 1002 .  HYDROcodone-acetaminophen (NORCO/VICODIN) 5-325 MG per tablet 1 tablet, 1 tablet, Oral, Q4H PRN, Toy Baker, MD, 1 tablet at 11/27/16 0213 .  [START ON 11/28/2016] irbesartan (AVAPRO) tablet 75  mg, 75 mg, Oral, Daily, Vance Gather B, MD .  letrozole [facility]) tablet 2.5 mg, 2.5 mg, Oral, Daily, Doutova, Anastassia, MD, 2.5 mg at 11/27/16 1009 .  MEDLINE mouth rinse, 15 mL, Mouth Rinse, BID, Doutova, Anastassia, MD, 15 mL at 11/27/16 1003 .  multivitamin with minerals tablet 1 tablet, 1 tablet, Oral, Daily, Patrecia Pour, MD, 1 tablet at 11/27/16 1358 .  ondansetron (ZOFRAN) tablet 4 mg, 4 mg, Oral, Q6H PRN **OR** ondansetron (ZOFRAN) injection 4 mg, 4 mg, Intravenous, Q6H PRN, Doutova, Anastassia, MD, 4 mg at 11/27/16 1047 .  polyethylene glycol (MIRALAX / GLYCOLAX) packet 17 g, 17 g, Oral, Daily PRN, Doutova, Anastassia, MD .  potassium chloride SA (K-DUR,KLOR-CON) CR tablet 20 mEq, 20 mEq, Oral, Daily, Vance Gather B, MD, 20 mEq at 11/27/16 1200 .  senna (SENOKOT) tablet 8.6 mg, 1 tablet, Oral, BID, Doutova, Anastassia, MD, 8.6 mg at 11/27/16 1002 .  sodium chloride flush (NS) 0.9 % injection 3 mL, 3 mL, Intravenous, Q12H, Doutova, Anastassia, MD, 3 mL at 11/27/16 1003 .  sodium chloride flush (NS) 0.9 % injection 3 mL, 3 mL, Intravenous, PRN, Doutova, Anastassia, MD .  traMADol (ULTRAM) tablet 50 mg, 50 mg, Oral, Q8H PRN, Roel Cluck, Anastassia, MD .  Derrill Memo ON 11/28/2016] vancomycin (VANCOCIN) 500 mg in sodium chloride 0.9 % 100 mL IVPB, 500 mg, Intravenous, Q48H, Doutova, Anastassia, MD  ALLERGIES:   Allergies  Allergen Reactions  . Codeine Nausea And Vomiting    REVIEW OF SYSTEMS: REVIEWED IN DETAIL IN CHART  FAMILY HISTORY:   Family History  Problem Relation Age of Onset  . Cancer Mother   . CAD Father   . Cancer Other     SOCIAL HISTORY:   reports that  has never smoked. she has never used smokeless tobacco. She reports that she does not drink alcohol or use drugs.   EXAMINATION: Vital signs in last 24 hours: Temp:  [97.8 F (36.6 C)-99.7 F (37.6 C)] 97.8 F (36.6 C) (11/06 1255) Pulse Rate:  [85-109] 85 (11/06 1255) Resp:  [16-24] 20 (11/06 0955) BP:  (105-145)/(39-82) 105/61 (11/06 1255) SpO2:  [96 %-100 %] 98 % (11/06 1255) Weight:  [98 lb 15.8 oz (44.9 kg)] 98 lb 15.8 oz (44.9 kg) (11/06 0102)    Musculoskeletal Exam  :right knee with effusion/hemarthrosis related to fracture. Minimal pain to palpation. No instability. Superficial skin abrasion anteriorly-clean. Foot in sterile dressing and not removed. Full extension and 50 degrees of flexion. Motor exam appears intact but exam limited   DIAGNOSTIC STUDIES: Recent laboratory studies: Recent Labs    11/26/16 1529 11/27/16 0527  WBC 8.1 7.4  HGB 13.3 12.9  HCT 39.4 38.8  PLT 270 276   Recent Labs    11/26/16 1529 11/27/16 0527  NA 134* 137  K 3.9 3.4*  CL 95* 96*  CO2 27 28  BUN 28* 24*  CREATININE 1.41* 1.38*  GLUCOSE 117* 112*  CALCIUM 9.2 9.1   Lab Results  Component Value Date   INR 1.07 09/23/2012   INR 1.20 09/22/2012   INR 2.02 (H) 09/21/2012     Recent Radiographic Studies :  Dg Chest 2 View  Result Date: 11/26/2016 CLINICAL DATA:  UTI, weakness, history breast cancer, hypertension, atrial fibrillation EXAM: CHEST  2 VIEW COMPARISON:  09/13/2014 FINDINGS: Enlargement of cardiac silhouette. Atherosclerotic calcification aorta. Pulmonary vascularity normal. Moderate-sized hiatal hernia again seen. Lungs appear emphysematous but clear. No acute infiltrate, pleural effusion or pneumothorax. Diffuse osseous demineralization with evidence of prior vertebroplasties at the thoracolumbar junction. Scattered costal cartilaginous calcifications. IMPRESSION: Enlargement of cardiac silhouette. Moderate-sized hiatal hernia. Emphysematous changes without definite infiltrate. Electronically Signed   By: Lavonia Dana M.D.   On: 11/26/2016 17:11   Ct Abdomen Pelvis W Contrast  Result Date: 11/26/2016 CLINICAL DATA:  Rash left flank 2 weeks. EXAM: CT ABDOMEN AND PELVIS WITH CONTRAST TECHNIQUE: Multidetector CT imaging of the abdomen and pelvis was performed using the standard  protocol following bolus administration of intravenous contrast. CONTRAST:  14mL ISOVUE-300 IOPAMIDOL (ISOVUE-300) INJECTION 61% COMPARISON:  CT abdomen/pelvis 12/12/2006 and CT chest 04/12/2015 FINDINGS: Lower chest: Minimal bibasilar atelectasis/scarring. Calcification of the mitral valve annulus. Mild cardiomegaly. Moderate size hiatal hernia. Hepatobiliary: Within normal. Pancreas: Within normal. Spleen: Within normal. Adrenals/Urinary Tract: Adrenal glands are normal. Kidneys are within normal in size. No hydronephrosis or nephrolithiasis. 2 cm homogeneously hypodense mass over the upper pole left renal cortex likely hyperdense cysts. Ureters are within normal. There is moderate bladder distention. Stomach/Bowel: Moderate size hiatal hernia as the stomach is otherwise unremarkable. Small bowel is within normal. Appendix is normal. Moderate fecal retention throughout the colon. Vascular/Lymphatic: Moderate calcified plaque over the abdominal aorta and iliac arteries. No adenopathy. Reproductive: Unremarkable. Other: No free fluid or focal inflammatory change. Musculoskeletal: Hardware intact over the left femur. Bilateral degenerative changes of the hips. Severe degenerative change of the spine with curvature of the lumbar spine convex right. Previous kyphoplasties of T11 and T12 compression fractures. Moderate L1 compression fracture unchanged. IMPRESSION: No acute findings in the abdomen/pelvis. Mild to moderate bladder distention. Moderate size hiatal hernia. Aortic Atherosclerosis (ICD10-I70.0). 2 cm hypodensity over the upper pole left renal cortex indeterminate but likely a hyperdense cyst. Recommend followup ultrasound on elective basis. Stable compression fractures. Electronically Signed   By: Marin Olp M.D.   On: 11/26/2016 19:20   Dg Knee Complete 4 Views Right  Result Date: 11/26/2016 CLINICAL DATA:  Knee pain and weakness, UTI EXAM: RIGHT KNEE - COMPLETE 4+ VIEW COMPARISON:  11/07/2016  FINDINGS: Marked osseous demineralization. Advanced tricompartmental osteoarthritic changes with joint space narrowing greatest at lateral compartment. Small spurs at lateral compartment and patellofemoral joint. Mildly depressed fracture of the medial tibial plateau with associated small knee joint effusion. No additional fracture, dislocation, or bone destruction. Atherosclerotic calcifications of the superficial femoral and popliteal arteries. Calcifications at the suprapatellar region question muscular. IMPRESSION: Osseous demineralization with a depressed fracture of the RIGHT medial tibial plateau and small knee joint effusion. Degenerative changes RIGHT knee. Electronically Signed   By: Lavonia Dana M.D.   On: 11/26/2016 17:10   Xr Knee 3 View Right  Result Date: 11/07/2016 AP lateral views of right knee:No acute fracture. Osteopenic appearing bone. Severe tricompartmental arthritis throughout.   ASSESSMENT: minimally depressed medial tibial plateau fracture right knee   PLAN: no weight bearing right lower extremity for 1 month. Bed to chair. [place] for PT to assist with transfers and gentle ROM right knee. Knee immobilizer when not  in PT. Office two weeks for follow up films Garald Balding 11/27/2016, 6:00 PM

## 2016-11-27 NOTE — Progress Notes (Signed)
ABI prelim: bil non compressible, however waveforms indicate adequate arterial flow. Landry Mellow, RDMS, RVT

## 2016-11-27 NOTE — Progress Notes (Signed)
*  PRELIMINARY RESULTS* Echocardiogram 2D Echocardiogram has been performed.  Holly Weaver 11/27/2016, 3:32 PM

## 2016-11-27 NOTE — Evaluation (Signed)
Physical Therapy Evaluation Patient Details Name: Holly Weaver MRN: [id number] DOB: May 15, 1923 Today's Date: 11/27/2016   History of Present Illness  83 y.o. female with a history of AFib, CAD, HLD, HTN, chronic HFpEF, malnutrition, and venous insufficiency with leg wounds who presented to the ED with multiple complaints including diarrhea following laxative use, painful leg wound, and frequent falls. She was found to have symptomatic pyuria, generalized weakness, and lower extremity cellulitis.   Clinical Impression  Pt admitted with above diagnosis. Pt currently with functional limitations due to the deficits listed below (see PT Problem List). Mod assist to transfer bed to recliner, pt limited by R knee pain. Would benefit from ST-SNF. 6 weeks ago she was ambulatory but has had decline in mobility since then, to the point of being bedbound for past 2 weeks.  Pt will benefit from skilled PT to increase their independence and safety with mobility to allow discharge to the venue listed below.       Follow Up Recommendations SNF    Equipment Recommendations  Hospital bed    Recommendations for Other Services       Precautions / Restrictions Precautions Precautions: Fall Precaution Comments: 1 fall in bathroom 2 weeks ago (tripped on shoes that didn't fit well bc of LE swelling) Restrictions Weight Bearing Restrictions: No      Mobility  Bed Mobility Overal bed mobility: Needs Assistance Bed Mobility: Supine to Sit     Supine to sit: Min assist;HOB elevated     General bed mobility comments: min A to raise trunk and scoot to EOB  Transfers Overall transfer level: Needs assistance Equipment used: Rolling walker (2 wheeled) Transfers: Sit to/from [facility] Sit to Stand: Mod assist;+2 safety/equipment;+2 physical assistance Stand pivot transfers: +2 safety/equipment;Min assist       General transfer comment: assist to rise/steady, min A for balance  and to WB thru BUEs to unweight painful R knee  Ambulation/Gait             General Gait Details: deferred 2* R knee pain  Stairs            Wheelchair Mobility    Modified Rankin (Stroke Patients Only)       Balance Overall balance assessment: Needs assistance Sitting-balance support: Feet supported Sitting balance-Leahy Scale: Good     Standing balance support: Bilateral upper extremity supported Standing balance-Leahy Scale: Poor Standing balance comment: relies on BUE support                             Pertinent Vitals/Pain Pain Assessment: Faces Faces Pain Scale: Hurts even more Pain Location: R knee with movement and weight bearing Pain Descriptors / Indicators: Grimacing Pain Intervention(s): Limited activity within patient's tolerance;Monitored during session;Repositioned    Home Living Family/patient expects to be discharged to:: Skilled nursing facility Living Arrangements: Alone Available Help at Discharge: Family;Available 24 hours/day   Home Access: Stairs to enter   Entrance Stairs-Number of Steps: 2 Home Layout: One level Home Equipment: Walker - 4 wheels;Transport chair;Shower seat;Bedside commode Additional Comments: daughter stays with pt most of the time    Prior Function Level of Independence: Needs assistance         Comments: hasn't walked much in about 6 weeks due to R knee pain and LE wounds/swelling, used diapers at home, hasn't been out of bed for 2 weeks; prior to 6 weeks ago she could walk with rollator  Hand Dominance        Extremity/Trunk Assessment   Upper Extremity Assessment Upper Extremity Assessment: Overall WFL for tasks assessed    Lower Extremity Assessment Lower Extremity Assessment: RLE deficits/detail RLE Deficits / Details: knee ext -30* AROM limited by pain (pt reports dx of severe OA R knee)    Cervical / Trunk Assessment Cervical / Trunk Assessment: Kyphotic  Communication    Communication: No difficulties  Cognition Arousal/Alertness: Awake/alert Behavior During Therapy: WFL for tasks assessed/performed Overall Cognitive Status: Within Functional Limits for tasks assessed                                        General Comments      Exercises     Assessment/Plan    PT Assessment Patient needs continued PT services  PT Problem List Decreased strength;Decreased range of motion;Decreased activity tolerance;Decreased balance;Pain;Decreased mobility       PT Treatment Interventions Gait training;DME instruction;Functional mobility training;Therapeutic activities;Balance training;Therapeutic exercise;Patient/family education    PT Goals (Current goals can be found in the Care Plan section)  Acute Rehab PT Goals Patient Stated Goal: get strong enough to walk again PT Goal Formulation: With patient/family Time For Goal Achievement: 12/11/16 Potential to Achieve Goals: Fair    Frequency Min 3X/week   Barriers to discharge        Co-evaluation               AM-PAC PT "6 Clicks" Daily Activity  Outcome Measure Difficulty turning over in bed (including adjusting bedclothes, sheets and blankets)?: A Little Difficulty moving from lying on back to sitting on the side of the bed? : Unable Difficulty sitting down on and standing up from a chair with arms (e.g., wheelchair, bedside commode, etc,.)?: Unable Help needed moving to and from a bed to chair (including a wheelchair)?: A Lot Help needed walking in hospital room?: Total Help needed climbing 3-5 steps with a railing? : Total 6 Click Score: 9    End of Session Equipment Utilized During Treatment: Gait belt Activity Tolerance: Patient limited by fatigue;Patient limited by pain Patient left: in chair;with call bell/phone within reach;with family/visitor present Nurse Communication: Mobility status PT Visit Diagnosis: Muscle weakness (generalized) (M62.81);Difficulty in walking,  not elsewhere classified (R26.2);Pain;Unsteadiness on feet (R26.81) Pain - Right/Left: Right Pain - part of body: Knee    Time: 1914-7829 PT Time Calculation (min) (ACUTE ONLY): 26 min   Charges:   PT Evaluation $PT Eval Moderate Complexity: 1 Mod PT Treatments $Therapeutic Activity: 8-22 mins   PT G Codes:          Philomena Doheny 11/27/2016, 3:36 PM [phone]

## 2016-11-27 NOTE — Consult Note (Signed)
[facility] Nurse wound consult note Reason for Consult: MASD (IAD) and chronic non-healing LE ulcerations Wound type: full thickness Pressure Injury POA: NA Measurement:Right LE (medial) measures 5cm x 6cm x 0.2cm with 60% of wound bed obscured by the presence of thin white slough. Wound is exquisitely tender. LE is red, without significant edema. Suspect etiology is mixed etiology if not slightly more arterial than venous. LLE anterior aspect with area of full thickness injury obscured by dried black eschar.  2cm x 1cm. Rubor noted, no edema. Wound bed:AS described above Drainage (amount, consistency, odor) None from LLE, scant serous from RLE. Periwound:Inact, rubor, with areas of recently healed injury. Dressing procedure/placement/frequency: I will implement a conservative POC using a betadine swabstick application daily to the LLE until the dried serum/eschar lifts spontaneously. The RLE will be treated with xeroform gauze and secure with roll gauze.  Bilateral Prevalon pressure redistribution heel boots are ordered to provide floatation.  Instructions for timely incontinence care are provided. [facility] nursing team will not follow, but will remain available to this patient, the nursing and medical teams.  Please re-consult if needed. Thanks, Maudie Flakes, MSN, RN, [place], Arther Abbott  Pager# [phone]

## 2016-11-28 DIAGNOSIS — L03116 Cellulitis of left lower limb: Principal | ICD-10-CM

## 2016-11-28 DIAGNOSIS — M1711 Unilateral primary osteoarthritis, right knee: Secondary | ICD-10-CM

## 2016-11-28 DIAGNOSIS — I480 Paroxysmal atrial fibrillation: Secondary | ICD-10-CM

## 2016-11-28 DIAGNOSIS — I10 Essential (primary) hypertension: Secondary | ICD-10-CM

## 2016-11-28 DIAGNOSIS — N3000 Acute cystitis without hematuria: Secondary | ICD-10-CM

## 2016-11-28 LAB — URINALYSIS, ROUTINE W REFLEX MICROSCOPIC
BILIRUBIN URINE: NEGATIVE
Glucose, UA: NEGATIVE mg/dL
Ketones, ur: NEGATIVE mg/dL
Nitrite: NEGATIVE
PROTEIN: NEGATIVE mg/dL
SPECIFIC GRAVITY, URINE: 1.011 (ref 1.005–1.030)
pH: 7 (ref 5.0–8.0)

## 2016-11-28 LAB — BASIC METABOLIC PANEL
Anion gap: 10 (ref 5–15)
BUN: 23 mg/dL — AB (ref 6–20)
CHLORIDE: 99 mmol/L — AB (ref 101–111)
CO2: 28 mmol/L (ref 22–32)
CREATININE: 1.38 mg/dL — AB (ref 0.44–1.00)
Calcium: 8.7 mg/dL — ABNORMAL LOW (ref 8.9–10.3)
GFR calc Af Amer: 37 mL/min — ABNORMAL LOW (ref >60)
GFR, EST NON AFRICAN AMERICAN: 32 mL/min — AB (ref >60)
GLUCOSE: 104 mg/dL — AB (ref 65–99)
POTASSIUM: 3.6 mmol/L (ref 3.5–5.1)
Sodium: 137 mmol/L (ref 135–145)

## 2016-11-28 LAB — T4, FREE: FREE T4: 1.15 ng/dL — AB (ref 0.61–1.12)

## 2016-11-28 LAB — ECHOCARDIOGRAM COMPLETE
HEIGHTINCHES: 64 in
WEIGHTICAEL: 1583.78 [oz_av]

## 2016-11-28 MED ORDER — TRAMADOL HCL 50 MG PO TABS
50.0000 mg | ORAL_TABLET | Freq: Three times a day (TID) | ORAL | Status: DC | PRN
  Administered 2016-11-30: 50 mg via ORAL
  Filled 2016-11-28: qty 1

## 2016-11-28 NOTE — Plan of Care (Signed)
  Education: Knowledge of General Education information will improve 11/28/2016 1040 - Progressing by Kerrin Mo, RN   Health Behavior/Discharge Planning: Ability to manage health-related needs will improve 11/28/2016 1040 - Progressing by Kerrin Mo, RN   Clinical Measurements: Ability to maintain clinical measurements within normal limits will improve 11/28/2016 1040 - Progressing by Kerrin Mo, RN   Clinical Measurements: Will remain free from infection 11/28/2016 1040 - Progressing by Kerrin Mo, RN Note IV abx for UTI.    Clinical Measurements: Diagnostic test results will improve 11/28/2016 1040 - Progressing by Kerrin Mo, RN   Activity: Risk for activity intolerance will decrease 11/28/2016 1040 - Progressing by Kerrin Mo, RN Note Working w/ PT/OT. Requires rt knee immobilizer at all times and NWB on Rt.

## 2016-11-28 NOTE — Progress Notes (Signed)
PROGRESS NOTE    Holly Weaver  RWE:[id number] DOB: [birth year] DOA: 11/26/2016 PCP: [name], MD    Brief Narrative:   Holly Weaver is a 83 y.o. female with a history of AFib, CAD, HLD, HTN, chronic HFpEF, malnutrition, and venous insufficiency with leg wounds who presented to the ED with multiple complaints including diarrhea following laxative use, painful leg wound, and frequent falls. She was found to have symptomatic pyuria, generalized weakness, and lower extremity cellulitis. She was admitted for antibiotics, wound care, and PT evaluation.    Assessment & Plan:   Active Problems:   PAF (paroxysmal atrial fibrillation), maintaining SR   Hypertension   CAD in native artery, non obstructive by cath 2000   Protein-calorie malnutrition, severe (HCC)   Acute lower UTI   Falls   Cellulitis  Mild cellulitis and chronic venous insufficiency:  Improving.  Wound care recommendations on board.  Orthopedics consulted and recommendations given.  ABI'S Ordered.    UTI:  Urine cultures pending.    Chronic diastolic heart failure:  Resume lasix.   COPD:  Stable. No wheezing.    PAF:  Rate controlled on eliquis.    Hypertension;  Controlled.   Stage 4 CKD:  Creatinine at baseline.    Right medial tibial fracture:  No weight bearing.  Appreciate orthopedics recommendations.  PT recommending SNF.        DVT prophylaxis: apixaban. Code Status: (full code.  Family Communication: none at bedside.  Disposition Plan:snf in am    Consultants:   NONE.    Procedures: (NONE   Antimicrobials: on rocephin. For UTI.    Subjective: Comfortable, no new complaints.   Objective: Vitals:   11/28/16 0455 11/28/16 0810 11/28/16 0900 11/28/16 1242  BP: (!) 117/56  (!) 114/59 (!) 117/59  Pulse: 91  94 94  Resp: 20  18   Temp: 98.3 F (36.8 C)   98.1 F (36.7 C)  TempSrc: Oral   Oral  SpO2: 98% 98% 99% 100%  Weight:      Height:         Intake/Output Summary (Last 24 hours) at 11/28/2016 1635 Last data filed at 11/28/2016 1311 Gross per 24 hour  Intake 410 ml  Output 500 ml  Net -90 ml   Filed Weights   11/27/16 0102  Weight: 44.9 kg (98 lb 15.8 oz)    Examination:  General exam: Appears calm and comfortable  Respiratory system: Clear to auscultation. Respiratory effort normal. Cardiovascular system: S1 & S2 heard, RRR. No JVD,, rubs, gallops or clicks. No pedal edema. Gastrointestinal system: Abdomen is nondistended, soft and nontender. No organomegaly or masses felt. Normal bowel sounds heard. Central nervous system: Alert and oriented. No focal neurological deficits. Extremities: no cyanosis.  Skin: ulcer on the RLE, scab wounds on the left lower extremity.  Psychiatry: Judgement and insight appear normal. Mood & affect appropriate.     Data Reviewed: I have personally reviewed following labs and imaging studies  CBC: Recent Labs  Lab 11/26/16 1529 11/27/16 0527  WBC 8.1 7.4  NEUTROABS 6.9  --   HGB 13.3 12.9  HCT 39.4 38.8  MCV 103.1* 102.9*  PLT 270 619   Basic Metabolic Panel: Recent Labs  Lab 11/26/16 1529 11/27/16 0527 11/28/16 0525  NA 134* 137 137  K 3.9 3.4* 3.6  CL 95* 96* 99*  CO2 27 28 28   GLUCOSE 117* 112* 104*  BUN 28* 24* 23*  CREATININE 1.41* 1.38* 1.38*  CALCIUM 9.2  9.1 8.7*  MG  --  1.9  --   PHOS  --  3.5  --    GFR: Estimated Creatinine Clearance: 18.8 mL/min (A) (by C-G formula based on SCr of 1.38 mg/dL (H)). Liver Function Tests: Recent Labs  Lab 11/26/16 1529 11/27/16 0527  AST 35 36  ALT 26 25  ALKPHOS 105 100  BILITOT 1.1 1.3*  PROT 7.0 6.9  ALBUMIN 3.8 3.6   Recent Labs  Lab 11/26/16 1529  LIPASE 25   No results for input(s): AMMONIA in the last 168 hours. Coagulation Profile: No results for input(s): INR, PROTIME in the last 168 hours. Cardiac Enzymes: No results for input(s): CKTOTAL, CKMB, CKMBINDEX, TROPONINI in the last 168  hours. BNP (last 3 results) No results for input(s): PROBNP in the last 8760 hours. HbA1C: Recent Labs    11/27/16 0527  HGBA1C 5.8*   CBG: No results for input(s): GLUCAP in the last 168 hours. Lipid Profile: No results for input(s): CHOL, HDL, LDLCALC, TRIG, CHOLHDL, LDLDIRECT in the last 72 hours. Thyroid Function Tests: Recent Labs    11/27/16 0527 11/28/16 0525  TSH 5.688*  --   FREET4  --  1.15*   Anemia Panel: No results for input(s): VITAMINB12, FOLATE, FERRITIN, TIBC, IRON, RETICCTPCT in the last 72 hours. Sepsis Labs: Recent Labs  Lab 11/26/16 1603  LATICACIDVEN 1.88    Recent Results (from the past 240 hour(s))  Urine Culture     Status: Abnormal (Preliminary result)   Collection Time: 11/26/16 11:22 PM  Result Value Ref Range Status   Specimen Description URINE, CLEAN CATCH  Final   Special Requests NONE  Final   Culture >=100,000 COLONIES/mL GRAM NEGATIVE RODS (A)  Final   Report Status PENDING  Incomplete  MRSA PCR Screening     Status: None   Collection Time: 11/27/16  1:52 AM  Result Value Ref Range Status   MRSA by PCR NEGATIVE NEGATIVE Final    Comment:        The GeneXpert MRSA Assay (FDA approved for NASAL specimens only), is one component of a comprehensive MRSA colonization surveillance program. It is not intended to diagnose MRSA infection nor to guide or monitor treatment for MRSA infections.          Radiology Studies: Dg Chest 2 View  Result Date: 11/26/2016 CLINICAL DATA:  UTI, weakness, history breast cancer, hypertension, atrial fibrillation EXAM: CHEST  2 VIEW COMPARISON:  09/13/2014 FINDINGS: Enlargement of cardiac silhouette. Atherosclerotic calcification aorta. Pulmonary vascularity normal. Moderate-sized hiatal hernia again seen. Lungs appear emphysematous but clear. No acute infiltrate, pleural effusion or pneumothorax. Diffuse osseous demineralization with evidence of prior vertebroplasties at the thoracolumbar  junction. Scattered costal cartilaginous calcifications. IMPRESSION: Enlargement of cardiac silhouette. Moderate-sized hiatal hernia. Emphysematous changes without definite infiltrate. Electronically Signed   By: Lavonia Dana M.D.   On: 11/26/2016 17:11   Ct Abdomen Pelvis W Contrast  Result Date: 11/26/2016 CLINICAL DATA:  Rash left flank 2 weeks. EXAM: CT ABDOMEN AND PELVIS WITH CONTRAST TECHNIQUE: Multidetector CT imaging of the abdomen and pelvis was performed using the standard protocol following bolus administration of intravenous contrast. CONTRAST:  167mL ISOVUE-300 IOPAMIDOL (ISOVUE-300) INJECTION 61% COMPARISON:  CT abdomen/pelvis 12/12/2006 and CT chest 04/12/2015 FINDINGS: Lower chest: Minimal bibasilar atelectasis/scarring. Calcification of the mitral valve annulus. Mild cardiomegaly. Moderate size hiatal hernia. Hepatobiliary: Within normal. Pancreas: Within normal. Spleen: Within normal. Adrenals/Urinary Tract: Adrenal glands are normal. Kidneys are within normal in size. No hydronephrosis or nephrolithiasis.  2 cm homogeneously hypodense mass over the upper pole left renal cortex likely hyperdense cysts. Ureters are within normal. There is moderate bladder distention. Stomach/Bowel: Moderate size hiatal hernia as the stomach is otherwise unremarkable. Small bowel is within normal. Appendix is normal. Moderate fecal retention throughout the colon. Vascular/Lymphatic: Moderate calcified plaque over the abdominal aorta and iliac arteries. No adenopathy. Reproductive: Unremarkable. Other: No free fluid or focal inflammatory change. Musculoskeletal: Hardware intact over the left femur. Bilateral degenerative changes of the hips. Severe degenerative change of the spine with curvature of the lumbar spine convex right. Previous kyphoplasties of T11 and T12 compression fractures. Moderate L1 compression fracture unchanged. IMPRESSION: No acute findings in the abdomen/pelvis. Mild to moderate bladder  distention. Moderate size hiatal hernia. Aortic Atherosclerosis (ICD10-I70.0). 2 cm hypodensity over the upper pole left renal cortex indeterminate but likely a hyperdense cyst. Recommend followup ultrasound on elective basis. Stable compression fractures. Electronically Signed   By: Marin Olp M.D.   On: 11/26/2016 19:20   Dg Knee Complete 4 Views Right  Result Date: 11/26/2016 CLINICAL DATA:  Knee pain and weakness, UTI EXAM: RIGHT KNEE - COMPLETE 4+ VIEW COMPARISON:  11/07/2016 FINDINGS: Marked osseous demineralization. Advanced tricompartmental osteoarthritic changes with joint space narrowing greatest at lateral compartment. Small spurs at lateral compartment and patellofemoral joint. Mildly depressed fracture of the medial tibial plateau with associated small knee joint effusion. No additional fracture, dislocation, or bone destruction. Atherosclerotic calcifications of the superficial femoral and popliteal arteries. Calcifications at the suprapatellar region question muscular. IMPRESSION: Osseous demineralization with a depressed fracture of the RIGHT medial tibial plateau and small knee joint effusion. Degenerative changes RIGHT knee. Electronically Signed   By: Lavonia Dana M.D.   On: 11/26/2016 17:10        Scheduled Meds: . amiodarone  200 mg Oral q morning - 10a  . apixaban  2.5 mg Oral BID  . atorvastatin  40 mg Oral Daily  . feeding supplement (ENSURE ENLIVE)  237 mL Oral BID BM  . fluticasone furoate-vilanterol  1 puff Inhalation Daily  . furosemide  40 mg Oral Daily  . irbesartan  75 mg Oral Daily  . letrozole  2.5 mg Oral Daily  . mouth rinse  15 mL Mouth Rinse BID  . multivitamin with minerals  1 tablet Oral Daily  . potassium chloride  20 mEq Oral Daily  . senna  1 tablet Oral BID  . sodium chloride flush  3 mL Intravenous Q12H   Continuous Infusions: . sodium chloride    . cefTRIAXone (ROCEPHIN)  IV Stopped (11/28/16 0123)  . vancomycin       LOS: 2 days     Time spent: *35 minutes.     Hosie Poisson, MD [facility] Pager [phone]  If 7PM-7AM, please contact night-coverage [email or web address] Password [email or web address] 11/28/2016, 4:35 PM

## 2016-11-28 NOTE — Progress Notes (Signed)
Physical Therapy Treatment Patient Details Name: Holly Weaver MRN: [id number] DOB: 27-Jun-1923 Today's Date: 11/28/2016    History of Present Illness 83 y.o. female with a history of AFib, CAD, HLD, HTN, chronic HFpEF, malnutrition, and venous insufficiency with leg wounds who presented to the ED with multiple complaints including diarrhea following laxative use, painful leg wound, and frequent falls. She was found to have symptomatic pyuria, generalized weakness, and lower extremity cellulitis.     PT Comments    Patient with less ability to mobilize this session due to change in weight bearing R LE since ortho visit.  Able to transfer to [facility] with +2 A and NWB R LE with anterior to posterior technique.  Continued skilled PT needed during acute stay and continue to recommend SNF level rehab at d/c.   Follow Up Recommendations  SNF     Equipment Recommendations  Hospital bed    Recommendations for Other Services       Precautions / Restrictions Precautions Precautions: Fall Required Braces or Orthoses: Knee Immobilizer - Right Knee Immobilizer - Right: Other (comment);On at all times(except with PT ) Restrictions Weight Bearing Restrictions: Yes RLE Weight Bearing: Non weight bearing    Mobility  Bed Mobility Overal bed mobility: Needs Assistance Bed Mobility: Supine to Sit     Supine to sit: Min assist;HOB elevated;Mod assist     General bed mobility comments: assist for R LE and trunk, cues to use rail  Transfers Overall transfer level: Needs assistance   Transfers: Anterior-Posterior Transfer       Anterior-Posterior transfers: Mod assist;+2 physical assistance   General transfer comment: scooting backwards onto BSC due to no drop arm and pt able to use UE's to help, assist for lifting over edge of bed to [facility] and back to bed  Ambulation/Gait                 Stairs            Wheelchair Mobility    Modified Rankin (Stroke Patients  Only)       Balance                                            Cognition Arousal/Alertness: Awake/alert Behavior During Therapy: WFL for tasks assessed/performed Overall Cognitive Status: Within Functional Limits for tasks assessed                                        Exercises General Exercises - Lower Extremity Ankle Circles/Pumps: AROM;Both;5 reps;Supine Quad Sets: AROM;5 reps;Right;Supine Heel Slides: AAROM;Right;5 reps;Supine    General Comments        Pertinent Vitals/Pain Faces Pain Scale: Hurts even more Pain Location: R knee with movement  Pain Descriptors / Indicators: Grimacing;Guarding Pain Intervention(s): Limited activity within patient's tolerance;Repositioned    Home Living                      Prior Function            PT Goals (current goals can now be found in the care plan section) Progress towards PT goals: Goals downgraded-see care plan(due to change in weight bearing status)    Frequency    Min 3X/week      PT Plan Current plan remains  appropriate    Co-evaluation PT/OT/SLP Co-Evaluation/Treatment: Yes Reason for Co-Treatment: Complexity of the patient's impairments (multi-system involvement);For patient/therapist safety PT goals addressed during session: Mobility/safety with mobility        AM-PAC PT "6 Clicks" Daily Activity  Outcome Measure  Difficulty turning over in bed (including adjusting bedclothes, sheets and blankets)?: Unable Difficulty moving from lying on back to sitting on the side of the bed? : Unable Difficulty sitting down on and standing up from a chair with arms (e.g., wheelchair, bedside commode, etc,.)?: Unable Help needed moving to and from a bed to chair (including a wheelchair)?: A Lot Help needed walking in hospital room?: Total Help needed climbing 3-5 steps with a railing? : Total 6 Click Score: 7    End of Session   Activity Tolerance: Patient limited  by pain;Patient limited by fatigue Patient left: in bed;with bed alarm set;with call bell/phone within reach   PT Visit Diagnosis: Muscle weakness (generalized) (M62.81);Difficulty in walking, not elsewhere classified (R26.2);Pain;Unsteadiness on feet (R26.81) Pain - Right/Left: Right Pain - part of body: Knee     Time: 4650-3546 PT Time Calculation (min) (ACUTE ONLY): 36 min  Charges:  $Therapeutic Activity: 8-22 mins                    G CodesMagda Kiel, [place] [phone] 11/28/2016    Reginia Naas 11/28/2016, 12:07 PM

## 2016-11-28 NOTE — Evaluation (Signed)
Occupational Therapy Evaluation Patient Details Name: Holly Weaver MRN: [id number] DOB: [birth year] Today's Date: 11/28/2016    History of Present Illness 83 y.o. female with a history of AFib, CAD, HLD, HTN, chronic HFpEF, malnutrition, and venous insufficiency with leg wounds who presented to the ED with multiple complaints including diarrhea following laxative use, painful leg wound, and frequent falls. She was found to have symptomatic pyuria, generalized weakness, and lower extremity cellulitis.    Clinical Impression   Pt with decline in function and safety with ADLs and ADL mobility with decreased strength, balance and endurance. Pt would benefit from acute OT services to address impairments to maximize level of function and safety    Follow Up Recommendations  SNF;Supervision/Assistance - 24 hour    Equipment Recommendations  None recommended by OT;Other (comment)(TBD at next venue of care)    Recommendations for Other Services       Precautions / Restrictions Precautions Precautions: Fall Precaution Comments: 1 fall in bathroom 2 weeks ago (tripped on shoes that didn't fit well bc of LE swelling) Required Braces or Orthoses: Knee Immobilizer - Right Knee Immobilizer - Right: Other (comment);On at all times(except with PT) Restrictions Weight Bearing Restrictions: Yes RLE Weight Bearing: Non weight bearing      Mobility Bed Mobility Overal bed mobility: Needs Assistance Bed Mobility: Supine to Sit     Supine to sit: Min assist;HOB elevated;Mod assist     General bed mobility comments: assist for R LE and trunk, cues to use rail  Transfers Overall transfer level: Needs assistance Equipment used: Rolling walker (2 wheeled) Transfers: Anterior-Posterior Transfer Sit to Stand: (NWB R LE) Stand pivot transfers: +2 safety/equipment;Min assist   Anterior-Posterior transfers: Mod assist;+2 physical assistance   General transfer comment: scooting backwards  onto BSC due to no drop arm and pt able to use UE's to help, assist for lifting over edge of bed to [facility] and back to bed    Balance Overall balance assessment: Needs assistance Sitting-balance support: Feet supported Sitting balance-Leahy Scale: Good     Standing balance support: Bilateral upper extremity supported Standing balance-Leahy Scale: Poor                             ADL either performed or assessed with clinical judgement   ADL Overall ADL's : Needs assistance/impaired     Grooming: Wash/dry hands;Wash/dry face;Min guard;Sitting   Upper Body Bathing: Minimal assistance;Sitting   Lower Body Bathing: Maximal assistance   Upper Body Dressing : Min guard;Sitting   Lower Body Dressing: Total assistance   Toilet Transfer: +2 for physical assistance;+2 for safety/equipment;Moderate assistance;Anterior/posterior;BSC   Toileting- Water quality scientist and Hygiene: Maximal assistance       Functional mobility during ADLs: +2 for physical assistance;Moderate assistance       Vision Baseline Vision/History: Wears glasses Wears Glasses: Reading only Patient Visual Report: No change from baseline       Perception     Praxis      Pertinent Vitals/Pain Pain Assessment: Faces Faces Pain Scale: Hurts even more Pain Location: R LE with movement  Pain Descriptors / Indicators: Grimacing;Guarding Pain Intervention(s): Limited activity within patient's tolerance;Monitored during session;Repositioned     Hand Dominance Right   Extremity/Trunk Assessment Upper Extremity Assessment Upper Extremity Assessment: Generalized weakness   Lower Extremity Assessment Lower Extremity Assessment: Defer to PT evaluation   Cervical / Trunk Assessment Cervical / Trunk Assessment: Kyphotic   Communication Communication Communication: No  difficulties   Cognition Arousal/Alertness: Awake/alert Behavior During Therapy: WFL for tasks assessed/performed Overall  Cognitive Status: Within Functional Limits for tasks assessed                                     General Comments       Exercises General Exercises - Lower Extremity Ankle Circles/Pumps: AROM;Both;5 reps;Supine Quad Sets: AROM;5 reps;Right;Supine Heel Slides: AAROM;Right;5 reps;Supine   Shoulder Instructions      Home Living Family/patient expects to be discharged to:: Skilled nursing facility Living Arrangements: Alone Available Help at Discharge: Family;Available 24 hours/day   Home Access: Stairs to enter Entrance Stairs-Number of Steps: 2   Home Layout: One level               Home Equipment: Walker - 4 wheels;Transport chair;Shower seat;Bedside commode   Additional Comments: daughter stays with pt most of the time      Prior Functioning/Environment Level of Independence: Needs assistance        Comments: hasn't walked much in about 6 weeks due to R knee pain and LE wounds/swelling, used diapers at home, hasn't been out of bed for 2 weeks; prior to 6 weeks ago she could walk with rollator        OT Problem List: Decreased strength;Decreased activity tolerance;Decreased knowledge of use of DME or AE;Pain;Decreased coordination;Impaired balance (sitting and/or standing)      OT Treatment/Interventions: Self-care/ADL training;DME and/or AE instruction;Therapeutic activities;Therapeutic exercise;Patient/family education    OT Goals(Current goals can be found in the care plan section) Acute Rehab OT Goals Patient Stated Goal: get strong enough to walk again OT Goal Formulation: With patient Time For Goal Achievement: 12/05/16 Potential to Achieve Goals: Good ADL Goals Pt Will Perform Grooming: sitting;with set-up;with supervision Pt Will Perform Upper Body Bathing: with min guard assist;sitting Pt Will Perform Lower Body Bathing: with mod assist;sitting/lateral leans Pt Will Perform Upper Body Dressing: with min guard assist Pt Will Transfer  to Toilet: with min assist;anterior/posterior transfer;stand pivot transfer;bedside commode Pt Will Perform Toileting - Clothing Manipulation and hygiene: with mod assist;sitting/lateral leans  OT Frequency: Min 2X/week   Barriers to D/C: Decreased caregiver support          Co-evaluation PT/OT/SLP Co-Evaluation/Treatment: Yes Reason for Co-Treatment: Complexity of the patient's impairments (multi-system involvement);For patient/therapist safety PT goals addressed during session: Mobility/safety with mobility OT goals addressed during session: ADL's and self-care;Proper use of Adaptive equipment and DME      AM-PAC PT "6 Clicks" Daily Activity     Outcome Measure Help from another person eating meals?: None Help from another person taking care of personal grooming?: A Little Help from another person toileting, which includes using toliet, bedpan, or urinal?: A Lot Help from another person bathing (including washing, rinsing, drying)?: A Lot Help from another person to put on and taking off regular upper body clothing?: A Little Help from another person to put on and taking off regular lower body clothing?: Total 6 Click Score: 15   End of Session Equipment Utilized During Treatment: Other (comment)(BSC)  Activity Tolerance: Patient limited by pain Patient left: in bed;with call bell/phone within reach;with bed alarm set  OT Visit Diagnosis: Unsteadiness on feet (R26.81);History of falling (Z91.81);Muscle weakness (generalized) (M62.81);Pain Pain - Right/Left: Right Pain - part of body: Knee;Leg                Time: 1478-2956 OT Time  Calculation (min): 39 min Charges:  OT General Charges $OT Visit: 1 Visit OT Evaluation $OT Eval Moderate Complexity: 1 Mod OT Treatments $Therapeutic Activity: 8-22 mins G-Codes: OT G-codes **NOT FOR INPATIENT CLASS** Functional Assessment Tool Used: AM-PAC 6 Clicks Daily Activity     Britt Bottom 11/28/2016, 1:19 PM

## 2016-11-28 NOTE — Clinical Social Work Note (Signed)
Clinical Social Work Assessment  Patient Details  Name: Holly Weaver MRN: [id number] Date of Birth: Apr 22, 1923  Date of referral:  11/28/16               Reason for consult:  Facility Placement, Discharge Planning                Permission sought to share information with:  Family Supports Permission granted to share information::  Yes, Verbal Permission Granted  Name::     Holly Weaver  Agency::     Relationship::  Daughter   Contact Information:  [phone];; [phone]  Housing/Transportation Living arrangements for the past 2 months:  Single Family Home Source of Information:  Patient Patient Interpreter Needed:  None Criminal Activity/Legal Involvement Pertinent to Current Situation/Hospitalization:  No - Comment as needed Significant Relationships:  Adult Children Lives with:  Adult Children Do you feel safe going back to the place where you live?  (PT recommending SNF) Need for family participation in patient care:  Yes (Comment)  Care giving concerns:  Patient from home with daughter.Patient reports that at baseline she uses a rolling walker to get around and that her daughter helps her around the home. PT recommending SNF for ST rehab.    Social Worker assessment / plan:  CSW spoke with patient at bedside regarding PT recommendation for SNF for ST rehab. Patient reported that she has been to SNF in the past approx. 5 years ago, noting she stayed for 3 months after breaking her hip. CSW inquired if patient was interested in going to SNF for ST rehab, patient reported that CSW would need to speak with her daughter. Patient reported that her daughter would be at the hospital later today, CSW agreed to follow up with patient and patient's daughter about discharge plans.  CSW called patient's daughter, no answer. CSW left voicemail requesting return phone call.   Employment status:  Retired Forensic scientist:  Medicare PT Recommendations:  [place] / Referral to community resources:  [place]  Patient/Family's Response to care:  Patient currently unaware about discharge plans, waiting for patient's daughter's input. Patient appreciative of CSW assistance with discharge planning.  Patient/Family's Understanding of and Emotional Response to Diagnosis, Current Treatment, and Prognosis:  Patient presented calm and verbalized understanding of diagnosis. Patient reported that she had started feeling better but she had a "bad night" last night. CSW acknowledged patient's current condition and inquired about patient's support system. Patient reported that she is very close with her daughter and that she is very helpful. Patient reported that she has a son that she speaks with frequently but does not see as often because he is in a wheelchair and has issues getting around. CSW acknowledged and positively affirmed patient's support system.    Emotional Assessment Appearance:  Appears stated age Attitude/Demeanor/Rapport:  Other(Cooperative) Affect (typically observed):  Calm, Appropriate Orientation:  Oriented to Self, Oriented to Place, Oriented to Situation Alcohol / Substance use:  Not Applicable Psych involvement (Current and /or in the community):  No (Comment)  Discharge Needs  Concerns to be addressed:  Care Coordination Readmission within the last 30 days:  No Current discharge risk:  Physical Impairment Barriers to Discharge:  Continued Medical Work up   [employer], LCSW 11/28/2016, 11:38 AM

## 2016-11-29 DIAGNOSIS — L03119 Cellulitis of unspecified part of limb: Secondary | ICD-10-CM

## 2016-11-29 DIAGNOSIS — E43 Unspecified severe protein-calorie malnutrition: Secondary | ICD-10-CM

## 2016-11-29 LAB — T3, FREE: T3 FREE: 1.6 pg/mL — AB (ref 2.0–4.4)

## 2016-11-29 MED ORDER — TRAMADOL HCL 50 MG PO TABS: 50.0000 mg | ORAL_TABLET | Freq: Three times a day (TID) | ORAL | 0 refills | Status: AC | PRN

## 2016-11-29 MED ORDER — ALPRAZOLAM 0.25 MG PO TABS: 0.2500 mg | ORAL_TABLET | Freq: Two times a day (BID) | ORAL | 0 refills | Status: DC | PRN

## 2016-11-29 MED ORDER — HYDROCODONE-ACETAMINOPHEN 5-325 MG PO TABS: 1.0000 | ORAL_TABLET | ORAL | 0 refills | Status: DC | PRN

## 2016-11-29 NOTE — [place] FL2 (Signed)
[place] LEVEL OF CARE SCREENING TOOL     IDENTIFICATION  Patient Name: Holly Weaver Birthdate: 12-24-1923 Sex: female Admission Date (Current Location): 11/26/2016  [place] [facility] and [place] Number:  Herbalist and Address:  [facility],  [place] [address], [place]      Provider Number: [id number]  Attending Physician Name and Address:  Hosie Poisson, MD  Relative Name and Phone Number:       Current Level of Care: Hospital Recommended Level of Care: [place] Prior Approval Number:    Date Approved/Denied:   PASRR Number: [id number]  Discharge Plan: SNF    Current Diagnoses: Patient Active Problem List   Diagnosis Date Noted  . Acute lower UTI 11/26/2016  . Falls 11/26/2016  . Cellulitis 11/26/2016  . Osteopenia 12/30/2015  . Atelectasis 08/30/2015  . Dyspnea on exertion 04/04/2015  . Chronic pulmonary embolism ([place]) 04/04/2015  . Acute respiratory failure with hypoxia ([place]) 09/14/2014  . Lobar pneumonia due to unspecified organism 09/14/2014  . Protein-calorie malnutrition, severe ([place]) 09/14/2014  . Pressure ulcer 09/11/2014  . Breast cancer of upper-outer quadrant of left female breast ([place]) 05/26/2013  . CAD in native artery, non obstructive by cath 2000 09/23/2012  . Dyslipidemia 09/21/2012  . Hypertension 09/21/2012  . PAF (paroxysmal atrial fibrillation), maintaining SR 05/10/2012  . Lilianah Buffin term current use of anticoagulant therapy 05/10/2012    Orientation RESPIRATION BLADDER Height & Weight     Self, Situation, Place  Normal Incontinent Weight: 98 lb 15.8 oz (44.9 kg) Height:  5\' 4"  (162.6 cm)  BEHAVIORAL SYMPTOMS/MOOD NEUROLOGICAL BOWEL NUTRITION STATUS      Continent Diet(Heart)  AMBULATORY STATUS COMMUNICATION OF NEEDS Skin   Total Care Verbally PU Stage and Appropriate Care, Other  (Comment)( Wound/Incision(OpenorDehisced)11/06/18Other(Comment)LegAnterior;Lower;Right;Mediallargeyellowcoveredshallowulcer   Impregnated Gauze Changed Daily)   PU Stage 2 Dressing: TID( PressureUlcer08/20/16StageII-Partialthicknesslossofdermispresentingasashallowopenulcerwithared,pinkwoundbedwithoutslough.openskinnodrainage  Location: Sacrum Location Orientation: Mid    Foam Dressing)                   Personal Care Assistance Level of Assistance  Bathing, Feeding, Dressing Bathing Assistance: Maximum assistance Feeding assistance: Independent Dressing Assistance: Maximum assistance     Functional Limitations Info  Sight, Hearing, Speech Sight Info: Adequate Hearing Info: Adequate Speech Info: Adequate    SPECIAL CARE FACTORS FREQUENCY  PT (By licensed PT), OT (By licensed OT)     PT Frequency: Min 3x/week OT Frequency: Min 2x/week            Contractures Contractures Info: Not present    Additional Factors Info  Code Status, Allergies Code Status Info: Full Code Allergies Info: Codeine           Current Medications (11/29/2016):  This is the current hospital active medication list Current Facility-Administered Medications  Medication Dose Route Frequency Provider Last Rate Last Dose  . 0.9 %  sodium chloride infusion  250 mL Intravenous PRN Toy Baker, MD      . acetaminophen (TYLENOL) tablet 650 mg  650 mg Oral Q6H PRN Toy Baker, MD   650 mg at 11/28/16 2200   Or  . acetaminophen (TYLENOL) suppository 650 mg  650 mg Rectal Q6H PRN Doutova, Anastassia, MD      . ALPRAZolam Duanne Moron) tablet 0.25 mg  0.25 mg Oral BID PRN Doutova, Anastassia, MD      . amiodarone (PACERONE) tablet 200 mg  200 mg Oral q morning - 10a Doutova, Anastassia, MD   200 mg at 11/28/16  0034  . apixaban (ELIQUIS) tablet 2.5 mg  2.5 mg Oral BID Doutova, Anastassia, MD   2.5 mg at 11/28/16 2200  . atorvastatin (LIPITOR) tablet 40 mg   40 mg Oral Daily Doutova, Nyoka Lint, MD   40 mg at 11/28/16 0905  . bisacodyl (DULCOLAX) suppository 10 mg  10 mg Rectal Daily PRN Doutova, Anastassia, MD      . cefTRIAXone (ROCEPHIN) 2 g in dextrose 5 % 50 mL IVPB  2 g Intravenous Q24H Toy Baker, MD   Stopped at 11/29/16 0151  . feeding supplement (ENSURE ENLIVE) (ENSURE ENLIVE) liquid 237 mL  237 mL Oral BID BM Doutova, Anastassia, MD   237 mL at 11/28/16 1341  . fluticasone furoate-vilanterol (BREO ELLIPTA) 200-25 MCG/INH 1 puff  1 puff Inhalation Daily Doutova, Anastassia, MD   1 puff at 11/28/16 0810  . furosemide (LASIX) tablet 40 mg  40 mg Oral Daily Doutova, Nyoka Lint, MD   40 mg at 11/28/16 0905  . HYDROcodone-acetaminophen (NORCO/VICODIN) 5-325 MG per tablet 1 tablet  1 tablet Oral Q4H PRN Toy Baker, MD   1 tablet at 11/28/16 0914  . irbesartan (AVAPRO) tablet 75 mg  75 mg Oral Daily Patrecia Pour, MD   75 mg at 11/28/16 0904  . letrozole [facility]) tablet 2.5 mg  2.5 mg Oral Daily Doutova, Anastassia, MD   2.5 mg at 11/28/16 0905  . MEDLINE mouth rinse  15 mL Mouth Rinse BID Toy Baker, MD   15 mL at 11/28/16 2200  . multivitamin with minerals tablet 1 tablet  1 tablet Oral Daily Patrecia Pour, MD   1 tablet at 11/28/16 0906  . ondansetron (ZOFRAN) tablet 4 mg  4 mg Oral Q6H PRN Doutova, Anastassia, MD       Or  . ondansetron (ZOFRAN) injection 4 mg  4 mg Intravenous Q6H PRN Doutova, Anastassia, MD   4 mg at 11/27/16 1047  . polyethylene glycol (MIRALAX / GLYCOLAX) packet 17 g  17 g Oral Daily PRN Doutova, Anastassia, MD      . potassium chloride SA (K-DUR,KLOR-CON) CR tablet 20 mEq  20 mEq Oral Daily Patrecia Pour, MD   20 mEq at 11/28/16 0906  . senna (SENOKOT) tablet 8.6 mg  1 tablet Oral BID Doutova, Anastassia, MD   8.6 mg at 11/28/16 2200  . sodium chloride flush (NS) 0.9 % injection 3 mL  3 mL Intravenous Q12H Doutova, Anastassia, MD   3 mL at 11/28/16 2200  . sodium chloride flush (NS) 0.9 %  injection 3 mL  3 mL Intravenous PRN Doutova, Anastassia, MD      . traMADol (ULTRAM) tablet 50 mg  50 mg Oral Q8H PRN Hosie Poisson, MD      . vancomycin (VANCOCIN) 500 mg in sodium chloride 0.9 % 100 mL IVPB  500 mg Intravenous Q48H Toy Baker, MD   Stopped at 11/29/16 0151     Discharge Medications: Please see discharge summary for a list of discharge medications.  Relevant Imaging Results:  Relevant Lab Results:   Additional Information SSN [id number]  Burnis Medin, LCSW

## 2016-11-29 NOTE — Progress Notes (Signed)
CSW followed up with patient/patient's daughter and discussed discharge planning. Patient's daughter reported that they are interested in SNF for ST rehab. Patient's daughter reported that they are interested in [facility] SNF or [facility] SNF preferably.  CSW will complete FL2 and follow up with desired SNFs.  Abundio Miu, [place] Social Worker [facility] Cell#: [phone]

## 2016-11-29 NOTE — Care Management Note (Signed)
Case Management Note  Patient Details  Name: MIKHAILA ROH MRN: [id number] Date of Birth: 17-Jan-1924  Subjective/Objective: Spoke to patient/dtr in rm about d/c plans-they both choose to d/c to SNF(prefer [facility])-CSW already following.                   Action/Plan:d/c SNF.   Expected Discharge Date:  (unknown)               Expected Discharge Plan:  Skilled Nursing Facility  In-House Referral:  Clinical Social Work  Discharge planning Services  CM Consult  Post Acute Care Choice:    Choice offered to:     DME Arranged:    DME Agency:     HH Arranged:    [place] Agency:     Status of Service:  Completed, signed off  If discussed at [employer], dates discussed:    Additional Comments:  Dessa Phi, RN 11/29/2016, 1:38 PM

## 2016-11-29 NOTE — Care Management Important Message (Signed)
Important Message  Patient Details  Name: Holly Weaver MRN: [id number] Date of Birth: 01-14-1924   Medicare Important Message Given:  Yes    Kerin Salen 11/29/2016, 10:12 AMImportant Message  Patient Details  Name: Holly Weaver MRN: [id number] Date of Birth: [birth year]   Medicare Important Message Given:  Yes    Kerin Salen 11/29/2016, 10:12 AM

## 2016-11-29 NOTE — Progress Notes (Signed)
PROGRESS NOTE    Holly Weaver  IRJ:[id number] DOB: [birth year] DOA: 11/26/2016 PCP: [name], MD    Brief Narrative:   Holly Weaver is a 83 y.o. female with a history of AFib, CAD, HLD, HTN, chronic HFpEF, malnutrition, and venous insufficiency with leg wounds who presented to the ED with multiple complaints including diarrhea following laxative use, painful leg wound, and frequent falls. She was found to have symptomatic pyuria, generalized weakness, and lower extremity cellulitis. She was admitted for antibiotics, wound care, and PT evaluation.    Assessment & Plan:   Active Problems:   PAF (paroxysmal atrial fibrillation), maintaining SR   Hypertension   CAD in native artery, non obstructive by cath 2000   Protein-calorie malnutrition, severe (HCC)   Acute lower UTI   Falls   Cellulitis  Mild cellulitis and chronic venous insufficiency:  Improving.  Wound care recommendations given.  Orthopedics consulted and recommendations given.  ABI'S Ordered. Reports Bilateral ABI non compressible, however waveforms indicates adequate arterial flow.   UTI:  Urine cultures pending,, show 1,00,000 gram neg rods and identification and sensitivities are pending.    Chronic diastolic heart failure: she appears compensated,  Resume lasix.   COPD:  Stable. No wheezing.    PAF:  Rate controlled and is  on eliquis.    Hypertension;  Controlled.   Stage 4 CKD:  Creatinine at baseline.    Right medial tibial fracture:  No weight bearing.  Appreciate orthopedics recommendations.  PT recommending SNF.    Abnormal thyroid panel Elevated TSH and free t4, probably from the amiodarone use,  Currently asymptomatic.      DVT prophylaxis: apixaban. Code Status: (full code.  Family Communication: none at bedside.  Disposition Plan:snf in am    Consultants:   NONE.    Procedures: (NONE   Antimicrobials: on rocephin. For UTI.     Subjective: Comfortable, no new complaints.  Plan to SNF IN AM.   Objective: Vitals:   11/29/16 0509 11/29/16 0800 11/29/16 0913 11/29/16 1332  BP: (!) 112/51 (!) 134/96  (!) 105/42  Pulse: 71 91 85 88  Resp: 16 16 16 16   Temp: 98.3 F (36.8 C)   99 F (37.2 C)  TempSrc: Oral   Oral  SpO2: 98% 98% 98% 99%  Weight:      Height:        Intake/Output Summary (Last 24 hours) at 11/29/2016 1447 Last data filed at 11/29/2016 1400 Gross per 24 hour  Intake 510 ml  Output -  Net 510 ml   Filed Weights   11/27/16 0102  Weight: 44.9 kg (98 lb 15.8 oz)    Examination:  General exam: Appears calm and comfortable no distress.  Respiratory system: Clear to auscultation. Respiratory effort normal. No wheezing or rhonchi.  Cardiovascular system: S1 & S2 heard, RRR. No JVD,, rubs, gallops or clicks. No pedal edema. Gastrointestinal system: Abdomen is soft non tender non distended bowel sounds heard.  Central nervous system: Alert and oriented. No focal neurological deficits.non focal.  Extremities: no cyanosis. Or clubbing.  Skin: ulcer on the RLE, scab wounds on the left lower extremity.  Psychiatry: Judgement and insight appear normal. Mood & affect appropriate.     Data Reviewed: I have personally reviewed following labs and imaging studies  CBC: Recent Labs  Lab 11/26/16 1529 11/27/16 0527  WBC 8.1 7.4  NEUTROABS 6.9  --   HGB 13.3 12.9  HCT 39.4 38.8  MCV 103.1* 102.9*  PLT  270 387   Basic Metabolic Panel: Recent Labs  Lab 11/26/16 1529 11/27/16 0527 11/28/16 0525  NA 134* 137 137  K 3.9 3.4* 3.6  CL 95* 96* 99*  CO2 27 28 28   GLUCOSE 117* 112* 104*  BUN 28* 24* 23*  CREATININE 1.41* 1.38* 1.38*  CALCIUM 9.2 9.1 8.7*  MG  --  1.9  --   PHOS  --  3.5  --    GFR: Estimated Creatinine Clearance: 18.8 mL/min (A) (by C-G formula based on SCr of 1.38 mg/dL (H)). Liver Function Tests: Recent Labs  Lab 11/26/16 1529 11/27/16 0527  AST 35 36  ALT 26  25  ALKPHOS 105 100  BILITOT 1.1 1.3*  PROT 7.0 6.9  ALBUMIN 3.8 3.6   Recent Labs  Lab 11/26/16 1529  LIPASE 25   No results for input(s): AMMONIA in the last 168 hours. Coagulation Profile: No results for input(s): INR, PROTIME in the last 168 hours. Cardiac Enzymes: No results for input(s): CKTOTAL, CKMB, CKMBINDEX, TROPONINI in the last 168 hours. BNP (last 3 results) No results for input(s): PROBNP in the last 8760 hours. HbA1C: Recent Labs    11/27/16 0527  HGBA1C 5.8*   CBG: No results for input(s): GLUCAP in the last 168 hours. Lipid Profile: No results for input(s): CHOL, HDL, LDLCALC, TRIG, CHOLHDL, LDLDIRECT in the last 72 hours. Thyroid Function Tests: Recent Labs    11/27/16 0527 11/28/16 0525  TSH 5.688*  --   FREET4  --  1.15*  T3FREE  --  1.6*   Anemia Panel: No results for input(s): VITAMINB12, FOLATE, FERRITIN, TIBC, IRON, RETICCTPCT in the last 72 hours. Sepsis Labs: Recent Labs  Lab 11/26/16 1603  LATICACIDVEN 1.88    Recent Results (from the past 240 hour(s))  Urine Culture     Status: Abnormal (Preliminary result)   Collection Time: 11/26/16 11:22 PM  Result Value Ref Range Status   Specimen Description URINE, CLEAN CATCH  Final   Special Requests NONE  Final   Culture (A)  Final    >=100,000 COLONIES/mL GRAM NEGATIVE RODS IDENTIFICATION AND SUSCEPTIBILITIES TO FOLLOW Performed at [facility], [address] [place], [place] [address]    Report Status PENDING  Incomplete  MRSA PCR Screening     Status: None   Collection Time: 11/27/16  1:52 AM  Result Value Ref Range Status   MRSA by PCR NEGATIVE NEGATIVE Final    Comment:        The GeneXpert MRSA Assay (FDA approved for NASAL specimens only), is one component of a comprehensive MRSA colonization surveillance program. It is not intended to diagnose MRSA infection nor to guide or monitor treatment for MRSA infections.          Radiology Studies: No results  found.      Scheduled Meds: . amiodarone  200 mg Oral q morning - 10a  . apixaban  2.5 mg Oral BID  . atorvastatin  40 mg Oral Daily  . feeding supplement (ENSURE ENLIVE)  237 mL Oral BID BM  . fluticasone furoate-vilanterol  1 puff Inhalation Daily  . furosemide  40 mg Oral Daily  . irbesartan  75 mg Oral Daily  . letrozole  2.5 mg Oral Daily  . mouth rinse  15 mL Mouth Rinse BID  . multivitamin with minerals  1 tablet Oral Daily  . potassium chloride  20 mEq Oral Daily  . senna  1 tablet Oral BID  . sodium chloride  flush  3 mL Intravenous Q12H   Continuous Infusions: . sodium chloride    . cefTRIAXone (ROCEPHIN)  IV Stopped (11/29/16 0151)  . vancomycin Stopped (11/29/16 0151)     LOS: 3 days    Time spent: *35 minutes.     Hosie Poisson, MD [facility] Pager [phone]  If 7PM-7AM, please contact night-coverage [email or web address] Password [facility] 11/29/2016, 2:47 PM

## 2016-11-29 NOTE — Clinical Social Work Placement (Signed)
   CLINICAL SOCIAL WORK PLACEMENT  NOTE  Date:  11/29/2016  Patient Details  Name: Holly Weaver MRN: [id number] Date of Birth: 1923-09-15  Clinical Social Work is seeking post-discharge placement for this patient at the [place] level of care (*CSW will initial, date and re-position this form in  chart as items are completed):  Yes   Patient/family provided with [place] Work Department's list of facilities offering this level of care within the geographic area requested by the patient (or if unable, by the patient's family).  Yes   Patient/family informed of their freedom to choose among providers that offer the needed level of care, that participate in Medicare, Medicaid or managed care program needed by the patient, have an available bed and are willing to accept the patient.  Yes   Patient/family informed of [facility]'s ownership interest in [facility] and [facility], as well as of the fact that they are under no obligation to receive care at these facilities.  PASRR submitted to EDS on       PASRR number received on       Existing PASRR number confirmed on 11/29/16     FL2 transmitted to all facilities in geographic area requested by pt/family on 11/29/16     FL2 transmitted to all facilities within larger geographic area on       Patient informed that his/her managed care company has contracts with or will negotiate with certain facilities, including the following:        Yes   Patient/family informed of bed offers received.  Patient chooses bed at       Physician recommends and patient chooses bed at      Patient to be transferred to   on  .  Patient to be transferred to facility by       Patient family notified on   of transfer.  Name of family member notified:        PHYSICIAN       Additional Comment:    _______________________________________________ Burnis Medin, LCSW 11/29/2016, 2:07 PM

## 2016-11-29 NOTE — Discharge Summary (Addendum)
Physician Discharge Summary  NONIE LOCHNER QMG:[id number] DOB: [birth year] DOA: 11/26/2016  PCP: [name], MD  Admit date: 11/26/2016 Discharge date: 11/30/2016  Admitted From: ALF Disposition:  SNF  Recommendations for Outpatient Follow-up:  1. Follow up with PCP in 1-2 weeks 2. Please obtain BMP/CBC in one week    Equipment/Devices: WOUND CARE   Discharge Condition:STABLE.  CODE STATUS: FULL CODE.  Diet recommendation: REGULAR.   Brief/Interim Summary: Robby Pirani South[place] a89 y.o.femalewith a history of AFib, CAD, HLD, HTN, chronic HFpEF, malnutrition, and venous insufficiency with leg wounds who presented to the ED with multiple complaints including diarrhea following laxative use, painful leg wound, and frequent falls. She was found to have symptomatic pyuria, generalized weakness, and lower extremity cellulitis. She was admitted for antibiotics, wound care, and PT evaluation.PT eval recommending SNF.    Discharge Diagnoses:  Active Problems:   PAF (paroxysmal atrial fibrillation), maintaining SR   Hypertension   CAD in native artery, non obstructive by cath 2000   Protein-calorie malnutrition, severe (HCC)   Acute lower UTI   Falls   Cellulitis  Mild cellulitis and chronic venous insufficiency:  Improving. Complete the course with bactrim.  Wound care recommendations given.  Orthopedics consulted and recommendations given.  ABI'S Ordered. Reports Bilateral ABI non compressible, however waveforms indicates adequate arterial flow.   UTI:  Urine cultures pending,, show klebsiella and citrobacter sensitive to bactrim.   Chronic diastolic heart failure: she appears compensated,  Resume lasix.   COPD:  Stable. No wheezing.    PAF:  Rate controlled and is  on eliquis.    Hypertension;  Controlled.   Stage 4 CKD:  Creatinine at baseline.    Right medial tibial fracture:  No weight bearing.  Appreciate orthopedics  recommendations.  PT recommending SNF.    Abnormal thyroid panel Elevated TSH and free t4, probably from the amiodarone use,  Currently asymptomatic. Repeat thyroid panel in 4 weeks.    Stage 2 sacral ulcer:  Present on admission.    Discharge Instructions  Discharge Instructions    Diet - low sodium heart healthy   Complete by:  As directed    Discharge instructions   Complete by:  As directed    Please follow up with PCP in one week.     Allergies as of 11/30/2016      Reactions   Codeine Nausea And Vomiting      Medication List    STOP taking these medications   cephALEXin 500 MG capsule Commonly known as:  KEFLEX   ondansetron 4 MG disintegrating tablet Commonly known as:  ZOFRAN ODT   valACYclovir 1000 MG tablet Commonly known as:  VALTREX     TAKE these medications   ALPRAZolam 0.25 MG tablet Commonly known as:  XANAX Take 1 tablet (0.25 mg total) 2 (two) times daily as needed by mouth for anxiety. Take one tablet by mouth every 12 hours as needed for severe anxiety What changed:    how much to take  how to take this  when to take this  reasons to take this   amiodarone 200 MG tablet Commonly known as:  PACERONE Take 200 mg by mouth every morning.   atorvastatin 40 MG tablet Commonly known as:  LIPITOR Take 40 mg daily by mouth.   BREO ELLIPTA 200-25 MCG/INH Aepb Generic drug:  fluticasone furoate-vilanterol Inhale 1 puff into the lungs daily.   calcium-vitamin D 500-200 MG-UNIT tablet Commonly known as:  OSCAL WITH D Take 1  tablet by mouth every morning.   denosumab 60 MG/ML Soln injection Commonly known as:  PROLIA Inject 60 mg into the skin every 6 (six) months. Administer in upper arm, thigh, or abdomen   ELIQUIS 2.5 MG Tabs tablet Generic drug:  apixaban Take 2.5 mg by mouth 2 (two) times daily.   feeding supplement (ENSURE ENLIVE) Liqd Take 237 mLs by mouth 2 (two) times daily between meals.   furosemide 40 MG  tablet Commonly known as:  LASIX Take 1 tablet (40 mg total) by mouth daily.   HYDROcodone-acetaminophen 5-325 MG tablet Commonly known as:  NORCO/VICODIN Take 1 tablet every 4 (four) hours as needed by mouth.   letrozole 2.5 MG tablet Commonly known as:  FEMARA Take 1 tablet (2.5 mg total) by mouth daily.   lidocaine 5 % Commonly known as:  LIDODERM Place 1 patch onto the skin. UNWRAP AND APPLY 1 PATCH TO SKIN DAILY FOR 12 HOURS AND 12 HOURS OFF   multivitamin with minerals Tabs tablet Take 1 tablet by mouth every morning. Centrum 50+   olmesartan 20 MG tablet Commonly known as:  BENICAR Take 20 mg by mouth daily.   ondansetron 4 MG tablet Commonly known as:  ZOFRAN Take 4 mg by mouth every 8 (eight) hours as needed for nausea or vomiting.   polyethylene glycol packet Commonly known as:  MIRALAX / GLYCOLAX Take 17 g by mouth daily as needed for mild constipation or moderate constipation.   potassium chloride SA 20 MEQ tablet Commonly known as:  K-DUR,KLOR-CON Take 20 mEq by mouth daily.   sulfamethoxazole-trimethoprim 400-80 MG tablet Commonly known as:  BACTRIM,SEPTRA Take 1 tablet every 12 (twelve) hours for 3 days by mouth.   traMADol 50 MG tablet Commonly known as:  ULTRAM Take 1 tablet (50 mg total) every 8 (eight) hours as needed by mouth for moderate pain or severe pain.   Vitamin D (Ergocalciferol) 50000 units Caps capsule Commonly known as:  DRISDOL Take 50,000 Units by mouth every 7 (seven) days.       Contact information for follow-up providers    [name], MD. Schedule an appointment as soon as possible for a visit in 1 week(s).   Specialty:  Internal Medicine Contact information: [place] [address] [phone]            Contact information for after-discharge care    Destination    [facility] .   Service:  Skilled Nursing Contact information: [place] [phone]                 Allergies  Allergen Reactions  . Codeine Nausea And Vomiting    Consultations:  Orthopedics.    Procedures/Studies: Dg Chest 2 View  Result Date: 11/26/2016 CLINICAL DATA:  UTI, weakness, history breast cancer, hypertension, atrial fibrillation EXAM: CHEST  2 VIEW COMPARISON:  09/13/2014 FINDINGS: Enlargement of cardiac silhouette. Atherosclerotic calcification aorta. Pulmonary vascularity normal. Moderate-sized hiatal hernia again seen. Lungs appear emphysematous but clear. No acute infiltrate, pleural effusion or pneumothorax. Diffuse osseous demineralization with evidence of prior vertebroplasties at the thoracolumbar junction. Scattered costal cartilaginous calcifications. IMPRESSION: Enlargement of cardiac silhouette. Moderate-sized hiatal hernia. Emphysematous changes without definite infiltrate. Electronically Signed   By: Lavonia Dana M.D.   On: 11/26/2016 17:11   Ct Abdomen Pelvis W Contrast  Result Date: 11/26/2016 CLINICAL DATA:  Rash left flank 2 weeks. EXAM: CT ABDOMEN AND PELVIS WITH CONTRAST TECHNIQUE: Multidetector CT  imaging of the abdomen and pelvis was performed using the standard protocol following bolus administration of intravenous contrast. CONTRAST:  185mL ISOVUE-300 IOPAMIDOL (ISOVUE-300) INJECTION 61% COMPARISON:  CT abdomen/pelvis 12/12/2006 and CT chest 04/12/2015 FINDINGS: Lower chest: Minimal bibasilar atelectasis/scarring. Calcification of the mitral valve annulus. Mild cardiomegaly. Moderate size hiatal hernia. Hepatobiliary: Within normal. Pancreas: Within normal. Spleen: Within normal. Adrenals/Urinary Tract: Adrenal glands are normal. Kidneys are within normal in size. No hydronephrosis or nephrolithiasis. 2 cm homogeneously hypodense mass over the upper pole left renal cortex likely hyperdense cysts. Ureters are within normal. There is moderate bladder distention. Stomach/Bowel: Moderate size hiatal hernia as  the stomach is otherwise unremarkable. Small bowel is within normal. Appendix is normal. Moderate fecal retention throughout the colon. Vascular/Lymphatic: Moderate calcified plaque over the abdominal aorta and iliac arteries. No adenopathy. Reproductive: Unremarkable. Other: No free fluid or focal inflammatory change. Musculoskeletal: Hardware intact over the left femur. Bilateral degenerative changes of the hips. Severe degenerative change of the spine with curvature of the lumbar spine convex right. Previous kyphoplasties of T11 and T12 compression fractures. Moderate L1 compression fracture unchanged. IMPRESSION: No acute findings in the abdomen/pelvis. Mild to moderate bladder distention. Moderate size hiatal hernia. Aortic Atherosclerosis (ICD10-I70.0). 2 cm hypodensity over the upper pole left renal cortex indeterminate but likely a hyperdense cyst. Recommend followup ultrasound on elective basis. Stable compression fractures. Electronically Signed   By: Marin Olp M.D.   On: 11/26/2016 19:20   Dg Knee Complete 4 Views Right  Result Date: 11/26/2016 CLINICAL DATA:  Knee pain and weakness, UTI EXAM: RIGHT KNEE - COMPLETE 4+ VIEW COMPARISON:  11/07/2016 FINDINGS: Marked osseous demineralization. Advanced tricompartmental osteoarthritic changes with joint space narrowing greatest at lateral compartment. Small spurs at lateral compartment and patellofemoral joint. Mildly depressed fracture of the medial tibial plateau with associated small knee joint effusion. No additional fracture, dislocation, or bone destruction. Atherosclerotic calcifications of the superficial femoral and popliteal arteries. Calcifications at the suprapatellar region question muscular. IMPRESSION: Osseous demineralization with a depressed fracture of the RIGHT medial tibial plateau and small knee joint effusion. Degenerative changes RIGHT knee. Electronically Signed   By: Lavonia Dana M.D.   On: 11/26/2016 17:10   Xr Knee 3 View  Right  Result Date: 11/07/2016 AP lateral views of right knee:No acute fracture. Osteopenic appearing bone. Severe tricompartmental arthritis throughout.     Subjective: No new complaints.   Discharge Exam: Vitals:   11/30/16 0910 11/30/16 0957  BP:  (!) 112/53  Pulse:  83  Resp:  18  Temp:    SpO2: 98%    Vitals:   11/29/16 2133 11/30/16 0410 11/30/16 0910 11/30/16 0957  BP: (!) 131/52 128/74  (!) 112/53  Pulse: 81 85  83  Resp: 18 18  18   Temp: 98.5 F (36.9 C) 98.2 F (36.8 C)    TempSrc: Oral Oral    SpO2: 99% 96% 98%   Weight:      Height:        General: Pt is alert, awake, not in acute distress Cardiovascular: RRR, S1/S2 +, no rubs, no gallops Respiratory: CTA bilaterally, no wheezing, no rhonchi Abdominal: Soft, NT, ND, bowel sounds + Extremities: no edema, no cyanosis    The results of significant diagnostics from this hospitalization (including imaging, microbiology, ancillary and laboratory) are listed below for reference.     Microbiology: Recent Results (from the past 240 hour(s))  Urine Culture     Status: Abnormal   Collection Time: 11/26/16 11:22  PM  Result Value Ref Range Status   Specimen Description URINE, CLEAN CATCH  Final   Special Requests NONE  Final   Culture (A)  Final    >=100,000 COLONIES/mL KLEBSIELLA PNEUMONIAE >=100,000 COLONIES/mL CITROBACTER YOUNGAE    Report Status 11/30/2016 FINAL  Final   Organism ID, Bacteria KLEBSIELLA PNEUMONIAE (A)  Final   Organism ID, Bacteria CITROBACTER YOUNGAE (A)  Final      Susceptibility   Citrobacter youngae - MIC*    CEFAZOLIN >=64 RESISTANT Resistant     CEFTRIAXONE <=1 SENSITIVE Sensitive     CIPROFLOXACIN <=0.25 SENSITIVE Sensitive     GENTAMICIN <=1 SENSITIVE Sensitive     IMIPENEM 0.5 SENSITIVE Sensitive     NITROFURANTOIN <=16 SENSITIVE Sensitive     TRIMETH/SULFA <=20 SENSITIVE Sensitive     PIP/TAZO <=4 SENSITIVE Sensitive     * >=100,000 COLONIES/mL CITROBACTER YOUNGAE    Klebsiella pneumoniae - MIC*    AMPICILLIN >=32 RESISTANT Resistant     CEFAZOLIN <=4 SENSITIVE Sensitive     CEFTRIAXONE <=1 SENSITIVE Sensitive     CIPROFLOXACIN <=0.25 SENSITIVE Sensitive     GENTAMICIN <=1 SENSITIVE Sensitive     IMIPENEM <=0.25 SENSITIVE Sensitive     NITROFURANTOIN 128 RESISTANT Resistant     TRIMETH/SULFA <=20 SENSITIVE Sensitive     AMPICILLIN/SULBACTAM 16 INTERMEDIATE Intermediate     PIP/TAZO 16 SENSITIVE Sensitive     Extended ESBL NEGATIVE Sensitive     * >=100,000 COLONIES/mL KLEBSIELLA PNEUMONIAE  MRSA PCR Screening     Status: None   Collection Time: 11/27/16  1:52 AM  Result Value Ref Range Status   MRSA by PCR NEGATIVE NEGATIVE Final    Comment:        The GeneXpert MRSA Assay (FDA approved for NASAL specimens only), is one component of a comprehensive MRSA colonization surveillance program. It is not intended to diagnose MRSA infection nor to guide or monitor treatment for MRSA infections.      Labs: BNP (last 3 results) No results for input(s): BNP in the last 8760 hours. Basic Metabolic Panel: Recent Labs  Lab 11/26/16 1529 11/27/16 0527 11/28/16 0525 11/30/16 0535  NA 134* 137 137  --   K 3.9 3.4* 3.6  --   CL 95* 96* 99*  --   CO2 27 28 28   --   GLUCOSE 117* 112* 104*  --   BUN 28* 24* 23*  --   CREATININE 1.41* 1.38* 1.38* 1.31*  CALCIUM 9.2 9.1 8.7*  --   MG  --  1.9  --   --   PHOS  --  3.5  --   --    Liver Function Tests: Recent Labs  Lab 11/26/16 1529 11/27/16 0527  AST 35 36  ALT 26 25  ALKPHOS 105 100  BILITOT 1.1 1.3*  PROT 7.0 6.9  ALBUMIN 3.8 3.6   Recent Labs  Lab 11/26/16 1529  LIPASE 25   No results for input(s): AMMONIA in the last 168 hours. CBC: Recent Labs  Lab 11/26/16 1529 11/27/16 0527  WBC 8.1 7.4  NEUTROABS 6.9  --   HGB 13.3 12.9  HCT 39.4 38.8  MCV 103.1* 102.9*  PLT 270 276   Cardiac Enzymes: No results for input(s): CKTOTAL, CKMB, CKMBINDEX, TROPONINI in the last 168  hours. BNP: Invalid input(s): POCBNP CBG: No results for input(s): GLUCAP in the last 168 hours. D-Dimer No results for input(s): DDIMER in the last 72 hours. Hgb A1c No results  for input(s): HGBA1C in the last 72 hours. Lipid Profile No results for input(s): CHOL, HDL, LDLCALC, TRIG, CHOLHDL, LDLDIRECT in the last 72 hours. Thyroid function studies Recent Labs    11/28/16 0525  T3FREE 1.6*   Anemia work up No results for input(s): VITAMINB12, FOLATE, FERRITIN, TIBC, IRON, RETICCTPCT in the last 72 hours. Urinalysis    Component Value Date/Time   COLORURINE YELLOW 11/28/2016 2045   APPEARANCEUR HAZY (A) 11/28/2016 2045   LABSPEC 1.011 11/28/2016 2045   PHURINE 7.0 11/28/2016 2045   GLUCOSEU NEGATIVE 11/28/2016 2045   HGBUR SMALL (A) 11/28/2016 2045   BILIRUBINUR NEGATIVE 11/28/2016 2045   KETONESUR NEGATIVE 11/28/2016 2045   PROTEINUR NEGATIVE 11/28/2016 2045   UROBILINOGEN 1.0 09/11/2014 0111   NITRITE NEGATIVE 11/28/2016 2045   LEUKOCYTESUR LARGE (A) 11/28/2016 2045   Sepsis Labs Invalid input(s): PROCALCITONIN,  WBC,  LACTICIDVEN Microbiology Recent Results (from the past 240 hour(s))  Urine Culture     Status: Abnormal   Collection Time: 11/26/16 11:22 PM  Result Value Ref Range Status   Specimen Description URINE, CLEAN CATCH  Final   Special Requests NONE  Final   Culture (A)  Final    >=100,000 COLONIES/mL KLEBSIELLA PNEUMONIAE >=100,000 COLONIES/mL CITROBACTER YOUNGAE    Report Status 11/30/2016 FINAL  Final   Organism ID, Bacteria KLEBSIELLA PNEUMONIAE (A)  Final   Organism ID, Bacteria CITROBACTER YOUNGAE (A)  Final      Susceptibility   Citrobacter youngae - MIC*    CEFAZOLIN >=64 RESISTANT Resistant     CEFTRIAXONE <=1 SENSITIVE Sensitive     CIPROFLOXACIN <=0.25 SENSITIVE Sensitive     GENTAMICIN <=1 SENSITIVE Sensitive     IMIPENEM 0.5 SENSITIVE Sensitive     NITROFURANTOIN <=16 SENSITIVE Sensitive     TRIMETH/SULFA <=20 SENSITIVE Sensitive      PIP/TAZO <=4 SENSITIVE Sensitive     * >=100,000 COLONIES/mL CITROBACTER YOUNGAE   Klebsiella pneumoniae - MIC*    AMPICILLIN >=32 RESISTANT Resistant     CEFAZOLIN <=4 SENSITIVE Sensitive     CEFTRIAXONE <=1 SENSITIVE Sensitive     CIPROFLOXACIN <=0.25 SENSITIVE Sensitive     GENTAMICIN <=1 SENSITIVE Sensitive     IMIPENEM <=0.25 SENSITIVE Sensitive     NITROFURANTOIN 128 RESISTANT Resistant     TRIMETH/SULFA <=20 SENSITIVE Sensitive     AMPICILLIN/SULBACTAM 16 INTERMEDIATE Intermediate     PIP/TAZO 16 SENSITIVE Sensitive     Extended ESBL NEGATIVE Sensitive     * >=100,000 COLONIES/mL KLEBSIELLA PNEUMONIAE  MRSA PCR Screening     Status: None   Collection Time: 11/27/16  1:52 AM  Result Value Ref Range Status   MRSA by PCR NEGATIVE NEGATIVE Final    Comment:        The GeneXpert MRSA Assay (FDA approved for NASAL specimens only), is one component of a comprehensive MRSA colonization surveillance program. It is not intended to diagnose MRSA infection nor to guide or monitor treatment for MRSA infections.      Time coordinating discharge: Over 30 minutes  SIGNED:   Hosie Poisson, MD  [facility] 11/30/2016, 12:34 PM Pager   If 7PM-7AM, please contact night-coverage [email or web address] Password [email or web address]

## 2016-11-30 DIAGNOSIS — Z79899 Other long term (current) drug therapy: Secondary | ICD-10-CM | POA: Diagnosis not present

## 2016-11-30 DIAGNOSIS — Y95 Nosocomial condition: Secondary | ICD-10-CM | POA: Diagnosis present

## 2016-11-30 DIAGNOSIS — R05 Cough: Secondary | ICD-10-CM | POA: Diagnosis not present

## 2016-11-30 DIAGNOSIS — R600 Localized edema: Secondary | ICD-10-CM | POA: Diagnosis not present

## 2016-11-30 DIAGNOSIS — S82134D Nondisplaced fracture of medial condyle of right tibia, subsequent encounter for closed fracture with routine healing: Secondary | ICD-10-CM | POA: Diagnosis not present

## 2016-11-30 DIAGNOSIS — N390 Urinary tract infection, site not specified: Secondary | ICD-10-CM | POA: Diagnosis not present

## 2016-11-30 DIAGNOSIS — L0390 Cellulitis, unspecified: Secondary | ICD-10-CM | POA: Diagnosis not present

## 2016-11-30 DIAGNOSIS — J440 Chronic obstructive pulmonary disease with acute lower respiratory infection: Secondary | ICD-10-CM | POA: Diagnosis present

## 2016-11-30 DIAGNOSIS — S92909A Unspecified fracture of unspecified foot, initial encounter for closed fracture: Secondary | ICD-10-CM | POA: Diagnosis not present

## 2016-11-30 DIAGNOSIS — M6281 Muscle weakness (generalized): Secondary | ICD-10-CM | POA: Diagnosis not present

## 2016-11-30 DIAGNOSIS — C50412 Malignant neoplasm of upper-outer quadrant of left female breast: Secondary | ICD-10-CM | POA: Diagnosis not present

## 2016-11-30 DIAGNOSIS — R110 Nausea: Secondary | ICD-10-CM | POA: Diagnosis present

## 2016-11-30 DIAGNOSIS — S82201D Unspecified fracture of shaft of right tibia, subsequent encounter for closed fracture with routine healing: Secondary | ICD-10-CM | POA: Diagnosis not present

## 2016-11-30 DIAGNOSIS — I2510 Atherosclerotic heart disease of native coronary artery without angina pectoris: Secondary | ICD-10-CM | POA: Diagnosis not present

## 2016-11-30 DIAGNOSIS — L89611 Pressure ulcer of right heel, stage 1: Secondary | ICD-10-CM | POA: Diagnosis not present

## 2016-11-30 DIAGNOSIS — M8580 Other specified disorders of bone density and structure, unspecified site: Secondary | ICD-10-CM | POA: Diagnosis not present

## 2016-11-30 DIAGNOSIS — N183 Chronic kidney disease, stage 3 (moderate): Secondary | ICD-10-CM | POA: Diagnosis not present

## 2016-11-30 DIAGNOSIS — R2689 Other abnormalities of gait and mobility: Secondary | ICD-10-CM | POA: Diagnosis not present

## 2016-11-30 DIAGNOSIS — G8911 Acute pain due to trauma: Secondary | ICD-10-CM | POA: Diagnosis not present

## 2016-11-30 DIAGNOSIS — L89151 Pressure ulcer of sacral region, stage 1: Secondary | ICD-10-CM | POA: Diagnosis not present

## 2016-11-30 DIAGNOSIS — J189 Pneumonia, unspecified organism: Secondary | ICD-10-CM | POA: Diagnosis not present

## 2016-11-30 DIAGNOSIS — Z7901 Long term (current) use of anticoagulants: Secondary | ICD-10-CM | POA: Diagnosis not present

## 2016-11-30 DIAGNOSIS — R0682 Tachypnea, not elsewhere classified: Secondary | ICD-10-CM | POA: Diagnosis not present

## 2016-11-30 DIAGNOSIS — E785 Hyperlipidemia, unspecified: Secondary | ICD-10-CM | POA: Diagnosis present

## 2016-11-30 DIAGNOSIS — I10 Essential (primary) hypertension: Secondary | ICD-10-CM | POA: Diagnosis not present

## 2016-11-30 DIAGNOSIS — I5032 Chronic diastolic (congestive) heart failure: Secondary | ICD-10-CM | POA: Diagnosis not present

## 2016-11-30 DIAGNOSIS — I480 Paroxysmal atrial fibrillation: Secondary | ICD-10-CM | POA: Diagnosis not present

## 2016-11-30 DIAGNOSIS — R0602 Shortness of breath: Secondary | ICD-10-CM | POA: Diagnosis not present

## 2016-11-30 DIAGNOSIS — I509 Heart failure, unspecified: Secondary | ICD-10-CM | POA: Diagnosis not present

## 2016-11-30 DIAGNOSIS — G8929 Other chronic pain: Secondary | ICD-10-CM | POA: Diagnosis not present

## 2016-11-30 DIAGNOSIS — J449 Chronic obstructive pulmonary disease, unspecified: Secondary | ICD-10-CM | POA: Diagnosis not present

## 2016-11-30 DIAGNOSIS — M25561 Pain in right knee: Secondary | ICD-10-CM | POA: Diagnosis not present

## 2016-11-30 DIAGNOSIS — N184 Chronic kidney disease, stage 4 (severe): Secondary | ICD-10-CM | POA: Diagnosis not present

## 2016-11-30 DIAGNOSIS — R069 Unspecified abnormalities of breathing: Secondary | ICD-10-CM | POA: Diagnosis not present

## 2016-11-30 DIAGNOSIS — M545 Low back pain: Secondary | ICD-10-CM | POA: Diagnosis not present

## 2016-11-30 DIAGNOSIS — Z853 Personal history of malignant neoplasm of breast: Secondary | ICD-10-CM | POA: Diagnosis not present

## 2016-11-30 DIAGNOSIS — M810 Age-related osteoporosis without current pathological fracture: Secondary | ICD-10-CM | POA: Diagnosis present

## 2016-11-30 DIAGNOSIS — Z9842 Cataract extraction status, left eye: Secondary | ICD-10-CM | POA: Diagnosis not present

## 2016-11-30 DIAGNOSIS — I872 Venous insufficiency (chronic) (peripheral): Secondary | ICD-10-CM | POA: Diagnosis not present

## 2016-11-30 DIAGNOSIS — L03115 Cellulitis of right lower limb: Secondary | ICD-10-CM | POA: Diagnosis not present

## 2016-11-30 DIAGNOSIS — I87313 Chronic venous hypertension (idiopathic) with ulcer of bilateral lower extremity: Secondary | ICD-10-CM | POA: Diagnosis not present

## 2016-11-30 DIAGNOSIS — I130 Hypertensive heart and chronic kidney disease with heart failure and stage 1 through stage 4 chronic kidney disease, or unspecified chronic kidney disease: Secondary | ICD-10-CM | POA: Diagnosis present

## 2016-11-30 DIAGNOSIS — Z961 Presence of intraocular lens: Secondary | ICD-10-CM | POA: Diagnosis present

## 2016-11-30 DIAGNOSIS — S82143A Displaced bicondylar fracture of unspecified tibia, initial encounter for closed fracture: Secondary | ICD-10-CM | POA: Diagnosis not present

## 2016-11-30 LAB — URINE CULTURE

## 2016-11-30 LAB — CREATININE, SERUM
CREATININE: 1.31 mg/dL — AB (ref 0.44–1.00)
GFR calc Af Amer: 40 mL/min — ABNORMAL LOW (ref >60)
GFR calc non Af Amer: 34 mL/min — ABNORMAL LOW (ref >60)

## 2016-11-30 MED ORDER — SULFAMETHOXAZOLE-TRIMETHOPRIM 400-80 MG PO TABS: 1.0000 | ORAL_TABLET | Freq: Two times a day (BID) | ORAL | 0 refills | Status: AC

## 2016-11-30 MED ORDER — CIPROFLOXACIN HCL 250 MG PO TABS: 250.0000 mg | ORAL_TABLET | Freq: Every day | ORAL | Status: DC

## 2016-11-30 MED ORDER — SULFAMETHOXAZOLE-TRIMETHOPRIM 400-80 MG PO TABS
1.0000 | ORAL_TABLET | Freq: Two times a day (BID) | ORAL | Status: DC
  Administered 2016-11-30: 1 via ORAL
  Filled 2016-11-30: qty 1

## 2016-11-30 NOTE — Clinical Social Work Placement (Signed)
Patient received and accepted bed offer at [facility]. Facility aware of patient's discharge and confirmed bed offer.PTAR contacted, patient's family notified. Patient's RN can call report to [phone], packet complete. CSW signing off, no other needs identified at this time.  CLINICAL SOCIAL WORK PLACEMENT  NOTE  Date:  11/30/2016  Patient Details  Name: Holly Weaver MRN: [id number] Date of Birth: [birth year]  Clinical Social Work is seeking post-discharge placement for this patient at the [place] level of care (*CSW will initial, date and re-position this form in  chart as items are completed):  Yes   Patient/family provided with [facility] Work Department's list of facilities offering this level of care within the geographic area requested by the patient (or if unable, by the patient's family).  Yes   Patient/family informed of their freedom to choose among providers that offer the needed level of care, that participate in Medicare, Medicaid or managed care program needed by the patient, have an available bed and are willing to accept the patient.  Yes   Patient/family informed of [facility]'s ownership interest in [facility] and [facility], as well as of the fact that they are under no obligation to receive care at these facilities.  PASRR submitted to EDS on       PASRR number received on       Existing PASRR number confirmed on 11/29/16     FL2 transmitted to all facilities in geographic area requested by pt/family on 11/29/16     FL2 transmitted to all facilities within larger geographic area on       Patient informed that his/her managed care company has contracts with or will negotiate with certain facilities, including the following:        Yes   Patient/family informed of bed offers received.  Patient chooses bed at [facility]     Physician recommends and patient chooses bed at      Patient to  be transferred to [facility] on 11/30/16.  Patient to be transferred to facility by PTAR     Patient family notified on 11/30/16 of transfer.  Name of family member notified:  Cora Collum     PHYSICIAN       Additional Comment:    _______________________________________________ Burnis Medin, LCSW 11/30/2016, 2:31 PM

## 2016-11-30 NOTE — Progress Notes (Signed)
Pt transferred to [facility] at this time via PTAR. Pt aware and agreeable to transfer. Dtr has taken all of pt personal belongings. Pt stable at time of d/c.

## 2016-11-30 NOTE — Progress Notes (Addendum)
Attempted to call report to [facility] x 2.  On hold for greater than 5 minutes each time. All necessary information in d/c summary and AVS.

## 2016-12-03 DIAGNOSIS — N390 Urinary tract infection, site not specified: Secondary | ICD-10-CM | POA: Diagnosis not present

## 2016-12-03 DIAGNOSIS — L03115 Cellulitis of right lower limb: Secondary | ICD-10-CM | POA: Diagnosis not present

## 2016-12-03 DIAGNOSIS — I87313 Chronic venous hypertension (idiopathic) with ulcer of bilateral lower extremity: Secondary | ICD-10-CM | POA: Diagnosis not present

## 2016-12-03 DIAGNOSIS — S82134D Nondisplaced fracture of medial condyle of right tibia, subsequent encounter for closed fracture with routine healing: Secondary | ICD-10-CM | POA: Diagnosis not present

## 2016-12-04 ENCOUNTER — Other Ambulatory Visit: Payer: Self-pay | Admitting: *Deleted

## 2016-12-04 NOTE — Patient Outreach (Signed)
[facility]) Care Management  12/04/2016  [place] [birth year] [id number]   Met with Vickii Chafe, SW at facility. She reports patient lives alone but has a supportive daughter who "stays" with patient except overnight.   Met with patient, she reports her daughter [name] is her caregiver. She takes care of her "business", transportation, and medications.  RNCM reviewed [facility] care management program.  Gave brochure and RNCM contact   Will follow up as indicated at next facility visit. Royetta Crochet. Laymond Purser, RN, BSN, [place] [phone]) Business Cell  [phone]) Toll Free Office

## 2016-12-05 DIAGNOSIS — S82143A Displaced bicondylar fracture of unspecified tibia, initial encounter for closed fracture: Secondary | ICD-10-CM | POA: Diagnosis not present

## 2016-12-05 DIAGNOSIS — L89151 Pressure ulcer of sacral region, stage 1: Secondary | ICD-10-CM | POA: Diagnosis not present

## 2016-12-05 DIAGNOSIS — L89611 Pressure ulcer of right heel, stage 1: Secondary | ICD-10-CM | POA: Diagnosis not present

## 2016-12-05 DIAGNOSIS — I10 Essential (primary) hypertension: Secondary | ICD-10-CM | POA: Diagnosis not present

## 2016-12-05 DIAGNOSIS — N184 Chronic kidney disease, stage 4 (severe): Secondary | ICD-10-CM | POA: Diagnosis not present

## 2016-12-05 DIAGNOSIS — L0390 Cellulitis, unspecified: Secondary | ICD-10-CM | POA: Diagnosis not present

## 2016-12-10 ENCOUNTER — Ambulatory Visit (INDEPENDENT_AMBULATORY_CARE_PROVIDER_SITE_OTHER): Payer: Medicare Other

## 2016-12-10 ENCOUNTER — Other Ambulatory Visit: Payer: Self-pay | Admitting: *Deleted

## 2016-12-10 ENCOUNTER — Ambulatory Visit (INDEPENDENT_AMBULATORY_CARE_PROVIDER_SITE_OTHER): Payer: Medicare Other | Admitting: Orthopaedic Surgery

## 2016-12-10 ENCOUNTER — Encounter (INDEPENDENT_AMBULATORY_CARE_PROVIDER_SITE_OTHER): Payer: Self-pay | Admitting: Orthopaedic Surgery

## 2016-12-10 VITALS — BP 112/54 | HR 92 | Resp 14 | Ht 63.0 in | Wt 105.0 lb

## 2016-12-10 DIAGNOSIS — I5032 Chronic diastolic (congestive) heart failure: Secondary | ICD-10-CM | POA: Diagnosis not present

## 2016-12-10 DIAGNOSIS — L03115 Cellulitis of right lower limb: Secondary | ICD-10-CM | POA: Diagnosis not present

## 2016-12-10 DIAGNOSIS — M25561 Pain in right knee: Secondary | ICD-10-CM

## 2016-12-10 DIAGNOSIS — G8929 Other chronic pain: Secondary | ICD-10-CM | POA: Diagnosis not present

## 2016-12-10 DIAGNOSIS — S82134D Nondisplaced fracture of medial condyle of right tibia, subsequent encounter for closed fracture with routine healing: Secondary | ICD-10-CM | POA: Diagnosis not present

## 2016-12-10 DIAGNOSIS — I10 Essential (primary) hypertension: Secondary | ICD-10-CM | POA: Diagnosis not present

## 2016-12-10 NOTE — Patient Outreach (Signed)
[facility]) Care Management  12/10/2016  Ximenna Fonseca [facility] August 19, 1923 [id number]   Met with patient and daughter at facility.  Patient heading out to a follow up MD appointment.   RNCM reviewed [facility] care management, daughter confirms they have brochure and RNCM contact.  Declines services at this time.   Plan to sign off, patient daughter has [facility] care management information for future reference.  Royetta Crochet. Laymond Purser, RN, BSN, [place] [phone]) Business Cell  [phone]) Toll Free Office

## 2016-12-10 NOTE — Progress Notes (Signed)
Office Visit Note   Patient: Holly Weaver           Date of Birth: [birth year]           MRN: [id number] Visit Date: 12/10/2016              Requested by: [name], MD [address] [place], [place] [address] PCP: [name], MD   Assessment & Plan: Visit Diagnoses:  1. Chronic pain of right knee     Plan: Approximately 3 weeks status post diagnosis of minimally displaced medial tibial plateau fracture right knee. Has been at [facility] rehabilitation nonweightbearing right lower extremity. Knee is becoming a little stiff. Will discontinue the knee immobilizer and have PT be more aggressive with her range of motion exercises. Return 3 weeks and repeat films. Continue nonweightbearing Follow-Up Instructions: Return in about 3 weeks (around 12/31/2016).   Orders:  Orders Placed This Encounter  Procedures  . XR KNEE 3 VIEW RIGHT   No orders of the defined types were placed in this encounter.     Procedures: No procedures performed   Clinical Data: No additional findings.   Subjective: Chief Complaint  Patient presents with  . Right Knee - Pain, Edema, Weakness    Holly Weaver is a 83 y o here for Right knee pain. She has been taking PT. Cannot bear any weight, twisted it in her bathroom. Ambulates with wheelchair  Multiple medical issues. Seen as a consult at [facility] with a minimally disc placed or impacted medial tibial plateau toe fracture. Have been treating her in a knee immobilizer and no weightbearing. Presently in inpatient rehabilitation. Has been comfortable.  HPI  Review of Systems  Constitutional: Negative for chills, fatigue and fever.  Eyes: Negative for itching.  Respiratory: Negative for chest tightness and shortness of breath.   Cardiovascular: Negative for chest pain, palpitations and leg swelling.  Gastrointestinal: Negative for blood in stool, constipation and diarrhea.  Endocrine: Negative for polyuria.  Genitourinary: Negative  for dysuria.  Musculoskeletal: Positive for back pain and neck stiffness. Negative for joint swelling and neck pain.  Allergic/Immunologic: Negative for immunocompromised state.  Neurological: Negative for dizziness and numbness.  Hematological: Does not bruise/bleed easily.  Psychiatric/Behavioral: The patient is not nervous/anxious.      Objective: Vital Signs: BP (!) 112/54   Pulse 92   Resp 14   Ht 5\' 3"  (1.6 m)   Wt 105 lb (47.6 kg)   BMI 18.60 kg/m   Physical Exam  Ortho Exam right knee has a smaller area of skin excoriation the very midportion of the patella could be related to the knee immobilizer. It's stable and without evidence of drainage or erythema. No knee effusion. Mild medial joint and proximal tibial pain from her fracture. No malalignment. Neurovascular exam intact.  Specialty Comments:  No specialty comments available.  Imaging: Xr Knee 3 View Right  Result Date: 12/10/2016 Films of the right knee were obtained in several projections. There is a slightly depressed medial tibial plateau fracture that does not appear to be symmetrically change from her films of several weeks ago. Diffuse bony demineralization    PMFS History: Patient Active Problem List   Diagnosis Date Noted  . Acute lower UTI 11/26/2016  . Falls 11/26/2016  . Cellulitis 11/26/2016  . Osteopenia 12/30/2015  . Atelectasis 08/30/2015  . Dyspnea on exertion 04/04/2015  . Chronic pulmonary embolism ([place]) 04/04/2015  . Acute respiratory failure with hypoxia ([place]) 09/14/2014  . Lobar pneumonia  due to unspecified organism 09/14/2014  . Protein-calorie malnutrition, severe ([place]) 09/14/2014  . Pressure ulcer 09/11/2014  . Breast cancer of upper-outer quadrant of left female breast ([place]) 05/26/2013  . CAD in native artery, non obstructive by cath 2000 09/23/2012  . Dyslipidemia 09/21/2012  . Hypertension 09/21/2012  . PAF (paroxysmal atrial fibrillation), maintaining SR 05/10/2012  . Long  term current use of anticoagulant therapy 05/10/2012   Past Medical History:  Diagnosis Date  . Arthritis    "hands and legs" (10/09/2012)  . Atrial fibrillation ([place])   . Breast cancer ([place]) ~ 2006   "left" (10/09/2012)  . Cervical spondylolysis 09/21/2012  . Chronic lower back pain   . Compression fracture 09/21/2012  . Dyslipidemia 09/21/2012  . Heart murmur   . History of blood transfusion    "years and years ago; I was anemic" (10/09/2012)  . Hypertension   . MVP (mitral valve prolapse) 09/23/2012  . Osteoarthritis 09/21/2012  . Osteoporosis   . Scoliosis   . Shortness of breath    "just now; nose is dry" (10/09/2012)    Family History  Problem Relation Age of Onset  . Cancer Mother   . CAD Father   . Cancer Other     Past Surgical History:  Procedure Laterality Date  . BREAST BIOPSY Left ~ 2006  . CARDIAC CATHETERIZATION  11/1998  . CATARACT EXTRACTION W/ INTRAOCULAR LENS IMPLANT Left 2000's  . HERNIA REPAIR  1950's?  . INTRAMEDULLARY (IM) NAIL FEMORAL Left 09/21/2012   Performed by Newt Minion, MD at [facility] ORS   Social History   Occupational History  . Not on file  Tobacco Use  . Smoking status: Never Smoker  . Smokeless tobacco: Never Used  Substance and Sexual Activity  . Alcohol use: No  . Drug use: No  . Sexual activity: No

## 2016-12-12 DIAGNOSIS — L89611 Pressure ulcer of right heel, stage 1: Secondary | ICD-10-CM | POA: Diagnosis not present

## 2016-12-12 DIAGNOSIS — L89151 Pressure ulcer of sacral region, stage 1: Secondary | ICD-10-CM | POA: Diagnosis not present

## 2016-12-19 DIAGNOSIS — L89151 Pressure ulcer of sacral region, stage 1: Secondary | ICD-10-CM | POA: Diagnosis not present

## 2016-12-19 DIAGNOSIS — L89611 Pressure ulcer of right heel, stage 1: Secondary | ICD-10-CM | POA: Diagnosis not present

## 2016-12-20 DIAGNOSIS — I5032 Chronic diastolic (congestive) heart failure: Secondary | ICD-10-CM | POA: Diagnosis not present

## 2016-12-20 DIAGNOSIS — I480 Paroxysmal atrial fibrillation: Secondary | ICD-10-CM | POA: Diagnosis not present

## 2016-12-20 DIAGNOSIS — M545 Low back pain: Secondary | ICD-10-CM | POA: Diagnosis not present

## 2016-12-20 DIAGNOSIS — S82134D Nondisplaced fracture of medial condyle of right tibia, subsequent encounter for closed fracture with routine healing: Secondary | ICD-10-CM | POA: Diagnosis not present

## 2016-12-26 DIAGNOSIS — L89151 Pressure ulcer of sacral region, stage 1: Secondary | ICD-10-CM | POA: Diagnosis not present

## 2016-12-26 DIAGNOSIS — L89611 Pressure ulcer of right heel, stage 1: Secondary | ICD-10-CM | POA: Diagnosis not present

## 2016-12-27 DIAGNOSIS — I480 Paroxysmal atrial fibrillation: Secondary | ICD-10-CM | POA: Diagnosis not present

## 2016-12-27 DIAGNOSIS — J449 Chronic obstructive pulmonary disease, unspecified: Secondary | ICD-10-CM | POA: Diagnosis not present

## 2016-12-27 DIAGNOSIS — S82134D Nondisplaced fracture of medial condyle of right tibia, subsequent encounter for closed fracture with routine healing: Secondary | ICD-10-CM | POA: Diagnosis not present

## 2016-12-27 DIAGNOSIS — M545 Low back pain: Secondary | ICD-10-CM | POA: Diagnosis not present

## 2016-12-31 ENCOUNTER — Ambulatory Visit (INDEPENDENT_AMBULATORY_CARE_PROVIDER_SITE_OTHER): Payer: Medicare Other | Admitting: Orthopaedic Surgery

## 2017-01-01 ENCOUNTER — Ambulatory Visit (INDEPENDENT_AMBULATORY_CARE_PROVIDER_SITE_OTHER): Payer: Medicare Other | Admitting: Orthopaedic Surgery

## 2017-01-02 DIAGNOSIS — L89151 Pressure ulcer of sacral region, stage 1: Secondary | ICD-10-CM | POA: Diagnosis not present

## 2017-01-02 DIAGNOSIS — L89611 Pressure ulcer of right heel, stage 1: Secondary | ICD-10-CM | POA: Diagnosis not present

## 2017-01-04 DIAGNOSIS — J449 Chronic obstructive pulmonary disease, unspecified: Secondary | ICD-10-CM | POA: Diagnosis not present

## 2017-01-04 DIAGNOSIS — I480 Paroxysmal atrial fibrillation: Secondary | ICD-10-CM | POA: Diagnosis not present

## 2017-01-04 DIAGNOSIS — I5032 Chronic diastolic (congestive) heart failure: Secondary | ICD-10-CM | POA: Diagnosis not present

## 2017-01-04 DIAGNOSIS — S82134D Nondisplaced fracture of medial condyle of right tibia, subsequent encounter for closed fracture with routine healing: Secondary | ICD-10-CM | POA: Diagnosis not present

## 2017-01-07 ENCOUNTER — Ambulatory Visit (INDEPENDENT_AMBULATORY_CARE_PROVIDER_SITE_OTHER): Payer: Medicare Other

## 2017-01-07 ENCOUNTER — Encounter (INDEPENDENT_AMBULATORY_CARE_PROVIDER_SITE_OTHER): Payer: Self-pay | Admitting: Orthopaedic Surgery

## 2017-01-07 ENCOUNTER — Ambulatory Visit (INDEPENDENT_AMBULATORY_CARE_PROVIDER_SITE_OTHER): Payer: Medicare Other | Admitting: Orthopaedic Surgery

## 2017-01-07 VITALS — Resp 12 | Ht 60.0 in | Wt 100.0 lb

## 2017-01-07 DIAGNOSIS — M25561 Pain in right knee: Secondary | ICD-10-CM

## 2017-01-07 NOTE — Progress Notes (Signed)
Office Visit Note   Patient: Holly Weaver           Date of Birth: 1923-12-05           MRN: [id number] Visit Date: 01/07/2017              Requested by: [name], MD [address] [place],  [address] PCP: [name], MD   Assessment & Plan: Visit Diagnoses:  1. Acute pain of right knee     Plan: 2 months status post diagnosis of a minimally displaced medial tibial plateau fracture right knee. Has been at [facility] rehabilitation nonweightbearing but receiving physical therapy. No significant pain. Films reveal no change in position. I think at this point she can weight-bear as tolerated with a walker and wear a knee support we'll plan to see her back in a month  Follow-Up Instructions: No Follow-up on file.   Orders:  Orders Placed This Encounter  Procedures  . XR KNEE 3 VIEW RIGHT   No orders of the defined types were placed in this encounter.     Procedures: No procedures performed   Clinical Data: No additional findings.   Subjective: Chief Complaint  Patient presents with  . Right Knee - Pain, Weakness    Holly Weaver is a 83 y o S/P 6 weeks status post diagnosis of minimally displaced medial tibial plateau fracture right knee. Has been at [facility] rehabilitation nonweightbearing right lower extremity.. Her PT is slow going due to non weightbearing, per    HPI  Review of Systems  Constitutional: Negative for chills, fatigue and fever.  Eyes: Negative for itching.  Respiratory: Negative for chest tightness and shortness of breath.   Cardiovascular: Negative for chest pain, palpitations and leg swelling.  Gastrointestinal: Negative for blood in stool, constipation and diarrhea.  Endocrine: Negative for polyuria.  Genitourinary: Negative for dysuria.  Musculoskeletal: Positive for back pain, neck pain and neck stiffness. Negative for joint swelling.  Allergic/Immunologic: Negative for immunocompromised state.  Neurological:  Negative for dizziness and numbness.  Hematological: Does not bruise/bleed easily.  Psychiatric/Behavioral: Positive for sleep disturbance. The patient is not nervous/anxious.      Objective: Vital Signs: Resp 12   Ht 5' (1.524 m)   Wt 100 lb (45.4 kg)   BMI 19.53 kg/m   Physical Exam  Ortho Exam full extension right knee flexion over 105 no instability. No localized areas of tenderness along the medial lateral joint line. Skin intact. No swelling distally. We can quads and hamstrings placed on length of. Nonweightbearing.Kermit Balo pulses distally  Specialty Comments:  No specialty comments available.  Imaging: Xr Knee 3 View Right  Result Date: 01/07/2017 films of the right knee were obtained in several projections and compared to prior films. Medial tibial plateau fracture is unchanged from previous films. It's it is slightly depressed but appears to be stable. Diffuse bony demineralization consistent with osteopenia or osteoporosis    PMFS History: Patient Active Problem List   Diagnosis Date Noted  . Acute lower UTI 11/26/2016  . Falls 11/26/2016  . Cellulitis 11/26/2016  . Osteopenia 12/30/2015  . Atelectasis 08/30/2015  . Dyspnea on exertion 04/04/2015  . Chronic pulmonary embolism ([place]) 04/04/2015  . Acute respiratory failure with hypoxia ([place]) 09/14/2014  . Lobar pneumonia due to unspecified organism 09/14/2014  . Protein-calorie malnutrition, severe ([place]) 09/14/2014  . Pressure ulcer 09/11/2014  . Breast cancer of upper-outer quadrant of left female breast ([place]) 05/26/2013  . CAD in native artery, non  obstructive by cath 2000 09/23/2012  . Dyslipidemia 09/21/2012  . Hypertension 09/21/2012  . PAF (paroxysmal atrial fibrillation), maintaining SR 05/10/2012  . Long term current use of anticoagulant therapy 05/10/2012   Past Medical History:  Diagnosis Date  . Arthritis    "hands and legs" (10/09/2012)  . Atrial fibrillation ([place])   . Breast cancer ([place]) ~  2006   "left" (10/09/2012)  . Cervical spondylolysis 09/21/2012  . Chronic lower back pain   . Compression fracture 09/21/2012  . Dyslipidemia 09/21/2012  . Heart murmur   . History of blood transfusion    "years and years ago; I was anemic" (10/09/2012)  . Hypertension   . MVP (mitral valve prolapse) 09/23/2012  . Osteoarthritis 09/21/2012  . Osteoporosis   . Scoliosis   . Shortness of breath    "just now; nose is dry" (10/09/2012)    Family History  Problem Relation Age of Onset  . Cancer Mother   . CAD Father   . Cancer Other     Past Surgical History:  Procedure Laterality Date  . BREAST BIOPSY Left ~ 2006  . CARDIAC CATHETERIZATION  11/1998  . CATARACT EXTRACTION W/ INTRAOCULAR LENS IMPLANT Left 2000's  . FEMUR IM NAIL Left 09/21/2012   Procedure: INTRAMEDULLARY (IM) NAIL FEMORAL;  Surgeon: Newt Minion, MD;  Location: [facility] ORS;  Service: Orthopedics;  Laterality: Left;  . HERNIA REPAIR  1950's?   Social History   Occupational History  . Not on file  Tobacco Use  . Smoking status: Never Smoker  . Smokeless tobacco: Never Used  Substance and Sexual Activity  . Alcohol use: No  . Drug use: No  . Sexual activity: No

## 2017-01-10 DIAGNOSIS — I5032 Chronic diastolic (congestive) heart failure: Secondary | ICD-10-CM | POA: Diagnosis not present

## 2017-01-10 DIAGNOSIS — N184 Chronic kidney disease, stage 4 (severe): Secondary | ICD-10-CM | POA: Diagnosis not present

## 2017-01-10 DIAGNOSIS — I10 Essential (primary) hypertension: Secondary | ICD-10-CM | POA: Diagnosis not present

## 2017-01-10 DIAGNOSIS — S82134D Nondisplaced fracture of medial condyle of right tibia, subsequent encounter for closed fracture with routine healing: Secondary | ICD-10-CM | POA: Diagnosis not present

## 2017-01-17 DIAGNOSIS — M810 Age-related osteoporosis without current pathological fracture: Secondary | ICD-10-CM | POA: Diagnosis not present

## 2017-01-17 DIAGNOSIS — R05 Cough: Secondary | ICD-10-CM | POA: Diagnosis not present

## 2017-01-17 DIAGNOSIS — I5032 Chronic diastolic (congestive) heart failure: Secondary | ICD-10-CM | POA: Diagnosis not present

## 2017-01-17 DIAGNOSIS — S82134D Nondisplaced fracture of medial condyle of right tibia, subsequent encounter for closed fracture with routine healing: Secondary | ICD-10-CM | POA: Diagnosis not present

## 2017-01-24 DIAGNOSIS — R05 Cough: Secondary | ICD-10-CM | POA: Diagnosis not present

## 2017-01-24 DIAGNOSIS — S82134D Nondisplaced fracture of medial condyle of right tibia, subsequent encounter for closed fracture with routine healing: Secondary | ICD-10-CM | POA: Diagnosis not present

## 2017-01-24 DIAGNOSIS — I480 Paroxysmal atrial fibrillation: Secondary | ICD-10-CM | POA: Diagnosis not present

## 2017-01-24 DIAGNOSIS — J449 Chronic obstructive pulmonary disease, unspecified: Secondary | ICD-10-CM | POA: Diagnosis not present

## 2017-01-27 ENCOUNTER — Inpatient Hospital Stay (HOSPITAL_COMMUNITY)
Admission: EM | Admit: 2017-01-27 | Discharge: 2017-01-29 | DRG: 194 | Disposition: A | Discharge status: Skilled Nursing Facility | Payer: Medicare Other | Attending: Nephrology | Admitting: Nephrology

## 2017-01-27 ENCOUNTER — Emergency Department (HOSPITAL_COMMUNITY): Payer: Medicare Other

## 2017-01-27 ENCOUNTER — Encounter (HOSPITAL_COMMUNITY): Payer: Self-pay

## 2017-01-27 ENCOUNTER — Other Ambulatory Visit: Payer: Self-pay

## 2017-01-27 DIAGNOSIS — C50412 Malignant neoplasm of upper-outer quadrant of left female breast: Secondary | ICD-10-CM

## 2017-01-27 DIAGNOSIS — I2510 Atherosclerotic heart disease of native coronary artery without angina pectoris: Secondary | ICD-10-CM | POA: Diagnosis present

## 2017-01-27 DIAGNOSIS — Z9842 Cataract extraction status, left eye: Secondary | ICD-10-CM

## 2017-01-27 DIAGNOSIS — Z79899 Other long term (current) drug therapy: Secondary | ICD-10-CM

## 2017-01-27 DIAGNOSIS — R600 Localized edema: Secondary | ICD-10-CM | POA: Diagnosis not present

## 2017-01-27 DIAGNOSIS — N183 Chronic kidney disease, stage 3 unspecified: Secondary | ICD-10-CM | POA: Diagnosis present

## 2017-01-27 DIAGNOSIS — I480 Paroxysmal atrial fibrillation: Secondary | ICD-10-CM | POA: Diagnosis not present

## 2017-01-27 DIAGNOSIS — Z853 Personal history of malignant neoplasm of breast: Secondary | ICD-10-CM

## 2017-01-27 DIAGNOSIS — I5032 Chronic diastolic (congestive) heart failure: Secondary | ICD-10-CM | POA: Diagnosis present

## 2017-01-27 DIAGNOSIS — R0602 Shortness of breath: Secondary | ICD-10-CM | POA: Diagnosis not present

## 2017-01-27 DIAGNOSIS — I130 Hypertensive heart and chronic kidney disease with heart failure and stage 1 through stage 4 chronic kidney disease, or unspecified chronic kidney disease: Secondary | ICD-10-CM | POA: Diagnosis not present

## 2017-01-27 DIAGNOSIS — M810 Age-related osteoporosis without current pathological fracture: Secondary | ICD-10-CM | POA: Diagnosis present

## 2017-01-27 DIAGNOSIS — J189 Pneumonia, unspecified organism: Secondary | ICD-10-CM | POA: Diagnosis not present

## 2017-01-27 DIAGNOSIS — J440 Chronic obstructive pulmonary disease with acute lower respiratory infection: Secondary | ICD-10-CM | POA: Diagnosis not present

## 2017-01-27 DIAGNOSIS — Z961 Presence of intraocular lens: Secondary | ICD-10-CM | POA: Diagnosis present

## 2017-01-27 DIAGNOSIS — R110 Nausea: Secondary | ICD-10-CM | POA: Diagnosis present

## 2017-01-27 DIAGNOSIS — E785 Hyperlipidemia, unspecified: Secondary | ICD-10-CM | POA: Diagnosis present

## 2017-01-27 DIAGNOSIS — J441 Chronic obstructive pulmonary disease with (acute) exacerbation: Secondary | ICD-10-CM | POA: Insufficient documentation

## 2017-01-27 DIAGNOSIS — Z7901 Long term (current) use of anticoagulants: Secondary | ICD-10-CM

## 2017-01-27 DIAGNOSIS — R0682 Tachypnea, not elsewhere classified: Secondary | ICD-10-CM | POA: Diagnosis not present

## 2017-01-27 DIAGNOSIS — J449 Chronic obstructive pulmonary disease, unspecified: Secondary | ICD-10-CM | POA: Diagnosis not present

## 2017-01-27 DIAGNOSIS — Y95 Nosocomial condition: Secondary | ICD-10-CM | POA: Diagnosis present

## 2017-01-27 DIAGNOSIS — I10 Essential (primary) hypertension: Secondary | ICD-10-CM | POA: Diagnosis present

## 2017-01-27 HISTORY — DX: Chronic obstructive pulmonary disease, unspecified: J44.9

## 2017-01-27 LAB — COMPREHENSIVE METABOLIC PANEL
ALT: 33 U/L (ref 14–54)
AST: 54 U/L — AB (ref 15–41)
Albumin: 3.5 g/dL (ref 3.5–5.0)
Alkaline Phosphatase: 67 U/L (ref 38–126)
Anion gap: 7 (ref 5–15)
BILIRUBIN TOTAL: 0.9 mg/dL (ref 0.3–1.2)
BUN: 44 mg/dL — AB (ref 6–20)
CHLORIDE: 100 mmol/L — AB (ref 101–111)
CO2: 28 mmol/L (ref 22–32)
CREATININE: 1.35 mg/dL — AB (ref 0.44–1.00)
Calcium: 8.7 mg/dL — ABNORMAL LOW (ref 8.9–10.3)
GFR, EST AFRICAN AMERICAN: 39 mL/min — AB (ref >60)
GFR, EST NON AFRICAN AMERICAN: 33 mL/min — AB (ref >60)
Glucose, Bld: 133 mg/dL — ABNORMAL HIGH (ref 65–99)
Potassium: 4.6 mmol/L (ref 3.5–5.1)
Sodium: 135 mmol/L (ref 135–145)
TOTAL PROTEIN: 6.8 g/dL (ref 6.5–8.1)

## 2017-01-27 LAB — CBC WITH DIFFERENTIAL/PLATELET
BASOS ABS: 0 10*3/uL (ref 0.0–0.1)
Basophils Relative: 0 %
EOS PCT: 1 %
Eosinophils Absolute: 0.1 10*3/uL (ref 0.0–0.7)
HEMATOCRIT: 37 % (ref 36.0–46.0)
Hemoglobin: 12.2 g/dL (ref 12.0–15.0)
LYMPHS ABS: 0.5 10*3/uL — AB (ref 0.7–4.0)
LYMPHS PCT: 7 %
MCH: 33.9 pg (ref 26.0–34.0)
MCHC: 33 g/dL (ref 30.0–36.0)
MCV: 102.8 fL — AB (ref 78.0–100.0)
Monocytes Absolute: 0.7 10*3/uL (ref 0.1–1.0)
Monocytes Relative: 11 %
NEUTROS ABS: 5.8 10*3/uL (ref 1.7–7.7)
Neutrophils Relative %: 81 %
PLATELETS: 236 10*3/uL (ref 150–400)
RBC: 3.6 MIL/uL — AB (ref 3.87–5.11)
RDW: 15 % (ref 11.5–15.5)
WBC: 7.1 10*3/uL (ref 4.0–10.5)

## 2017-01-27 LAB — I-STAT TROPONIN, ED: TROPONIN I, POC: 0.01 ng/mL (ref 0.00–0.08)

## 2017-01-27 LAB — I-STAT CHEM 8, ED
BUN: 60 mg/dL — ABNORMAL HIGH (ref 6–20)
CALCIUM ION: 1.11 mmol/L — AB (ref 1.15–1.40)
CREATININE: 1.4 mg/dL — AB (ref 0.44–1.00)
Chloride: 97 mmol/L — ABNORMAL LOW (ref 101–111)
GLUCOSE: 128 mg/dL — AB (ref 65–99)
HEMATOCRIT: 36 % (ref 36.0–46.0)
HEMOGLOBIN: 12.2 g/dL (ref 12.0–15.0)
Potassium: 5.2 mmol/L — ABNORMAL HIGH (ref 3.5–5.1)
Sodium: 137 mmol/L (ref 135–145)
TCO2: 32 mmol/L (ref 22–32)

## 2017-01-27 LAB — BRAIN NATRIURETIC PEPTIDE: B Natriuretic Peptide: 429 pg/mL — ABNORMAL HIGH (ref 0.0–100.0)

## 2017-01-27 MED ORDER — APIXABAN 2.5 MG PO TABS
2.5000 mg | ORAL_TABLET | Freq: Two times a day (BID) | ORAL | Status: DC
  Administered 2017-01-28 – 2017-01-29 (×3): 2.5 mg via ORAL
  Filled 2017-01-27 (×3): qty 1

## 2017-01-27 MED ORDER — ADULT MULTIVITAMIN W/MINERALS CH
1.0000 | ORAL_TABLET | Freq: Every morning | ORAL | Status: DC
  Administered 2017-01-28 – 2017-01-29 (×2): 1 via ORAL
  Filled 2017-01-27 (×2): qty 1

## 2017-01-27 MED ORDER — CEFEPIME HCL 1 G IJ SOLR
1.0000 g | Freq: Once | INTRAMUSCULAR | Status: AC
  Administered 2017-01-27: 1 g via INTRAVENOUS
  Filled 2017-01-27: qty 1

## 2017-01-27 MED ORDER — AMIODARONE HCL 200 MG PO TABS
200.0000 mg | ORAL_TABLET | Freq: Every morning | ORAL | Status: DC
  Administered 2017-01-28 – 2017-01-29 (×2): 200 mg via ORAL
  Filled 2017-01-27 (×2): qty 1

## 2017-01-27 MED ORDER — CALCIUM CARBONATE-VITAMIN D 500-200 MG-UNIT PO TABS
1.0000 | ORAL_TABLET | Freq: Every morning | ORAL | Status: DC
  Administered 2017-01-28 – 2017-01-29 (×2): 1 via ORAL
  Filled 2017-01-27 (×2): qty 1

## 2017-01-27 MED ORDER — POLYETHYLENE GLYCOL 3350 17 G PO PACK
17.0000 g | PACK | Freq: Every day | ORAL | Status: DC | PRN
Start: 2017-01-27 — End: 2017-01-29

## 2017-01-27 MED ORDER — IOPAMIDOL (ISOVUE-370) INJECTION 76%
INTRAVENOUS | Status: AC
  Administered 2017-01-27: 80 mL via INTRAVENOUS
  Filled 2017-01-27: qty 100

## 2017-01-27 MED ORDER — CEFEPIME HCL 1 G IJ SOLR
500.0000 mg | INTRAMUSCULAR | Status: DC
  Administered 2017-01-28: 500 mg via INTRAVENOUS
  Filled 2017-01-27 (×2): qty 0.5

## 2017-01-27 MED ORDER — ENSURE ENLIVE PO LIQD: 237.0000 mL | Freq: Two times a day (BID) | ORAL | Status: DC

## 2017-01-27 MED ORDER — POTASSIUM CHLORIDE CRYS ER 20 MEQ PO TBCR
20.0000 meq | EXTENDED_RELEASE_TABLET | Freq: Every day | ORAL | Status: DC
  Administered 2017-01-28 – 2017-01-29 (×2): 20 meq via ORAL
  Filled 2017-01-27 (×2): qty 1

## 2017-01-27 MED ORDER — HYDROCODONE-ACETAMINOPHEN 5-325 MG PO TABS
1.0000 | ORAL_TABLET | Freq: Four times a day (QID) | ORAL | Status: DC | PRN
  Administered 2017-01-28 (×2): 1 via ORAL
  Filled 2017-01-27 (×2): qty 1

## 2017-01-27 MED ORDER — DEXTROSE 5 % IV SOLN: 1.0000 g | Freq: Once | INTRAVENOUS | Status: DC

## 2017-01-27 MED ORDER — IRBESARTAN 150 MG PO TABS
150.0000 mg | ORAL_TABLET | Freq: Every day | ORAL | Status: DC
  Administered 2017-01-28 – 2017-01-29 (×2): 150 mg via ORAL
  Filled 2017-01-27 (×2): qty 1

## 2017-01-27 MED ORDER — VANCOMYCIN HCL 500 MG IV SOLR: 500.0000 mg | INTRAVENOUS | Status: DC

## 2017-01-27 MED ORDER — FUROSEMIDE 40 MG PO TABS
40.0000 mg | ORAL_TABLET | Freq: Every day | ORAL | Status: DC
  Administered 2017-01-28 – 2017-01-29 (×2): 40 mg via ORAL
  Filled 2017-01-27 (×2): qty 1

## 2017-01-27 MED ORDER — LETROZOLE 2.5 MG PO TABS
2.5000 mg | ORAL_TABLET | Freq: Every day | ORAL | Status: DC
  Administered 2017-01-28 – 2017-01-29 (×2): 2.5 mg via ORAL
  Filled 2017-01-27 (×2): qty 1

## 2017-01-27 MED ORDER — FLUTICASONE FUROATE-VILANTEROL 200-25 MCG/INH IN AEPB
1.0000 | INHALATION_SPRAY | Freq: Every day | RESPIRATORY_TRACT | Status: DC
  Administered 2017-01-29: 1 via RESPIRATORY_TRACT
  Filled 2017-01-27: qty 28

## 2017-01-27 MED ORDER — ALPRAZOLAM 0.25 MG PO TABS: 0.2500 mg | ORAL_TABLET | Freq: Two times a day (BID) | ORAL | Status: DC | PRN

## 2017-01-27 MED ORDER — LIDOCAINE 5 % EX PTCH
1.0000 | MEDICATED_PATCH | CUTANEOUS | Status: DC
  Administered 2017-01-28: 1 via TRANSDERMAL
  Filled 2017-01-27 (×2): qty 1

## 2017-01-27 MED ORDER — ATORVASTATIN CALCIUM 40 MG PO TABS
40.0000 mg | ORAL_TABLET | Freq: Every day | ORAL | Status: DC
  Administered 2017-01-28 – 2017-01-29 (×2): 40 mg via ORAL
  Filled 2017-01-27 (×2): qty 1

## 2017-01-27 MED ORDER — ALBUTEROL SULFATE (2.5 MG/3ML) 0.083% IN NEBU
2.5000 mg | INHALATION_SOLUTION | RESPIRATORY_TRACT | Status: DC | PRN
Start: 2017-01-27 — End: 2017-01-29

## 2017-01-27 MED ORDER — VANCOMYCIN HCL IN DEXTROSE 1-5 GM/200ML-% IV SOLN
1000.0000 mg | Freq: Once | INTRAVENOUS | Status: AC
  Administered 2017-01-27: 1000 mg via INTRAVENOUS
  Filled 2017-01-27: qty 200

## 2017-01-27 MED ORDER — SODIUM CHLORIDE 0.9 % IJ SOLN
INTRAMUSCULAR | Status: AC
  Filled 2017-01-27: qty 50

## 2017-01-27 NOTE — ED Triage Notes (Signed)
EMS reports from [facility] for right knee fx 9 weeks ago, here for increasing SOB x 1 day, recent DX of Brochitis and given zithromax x 3 doses. Hx of COPD CHF and A-fib  BP 142/78 HR 100 a-fib sp02 95 RA CBG 292 No hx of diabetes.  18ga L forearm

## 2017-01-27 NOTE — Progress Notes (Signed)
A consult was received from an ED physician for Cefepime per pharmacy dosing. The patient's profile has been reviewed for ht/wt/allergies/indication/available labs. A one time order has been placed for the above antibiotics.  Further antibiotics/pharmacy consults should be ordered by admitting physician if indicated.                       Thank you, Reuel Boom, PharmD, BCPS Pager: [phone] 01/27/2017, 7:23 PM

## 2017-01-27 NOTE — ED Provider Notes (Signed)
[place] DEPT Provider Note   CSN: [id number] Arrival date & time: 01/27/17  1553     History   Chief Complaint Chief Complaint  Patient presents with  . Shortness of Breath    HPI Holly Weaver is a 84 y.o. female history of COPD, hypertension, diastolic heart failure, A. fib on Eliquis here presenting with shortness of breath.  Patient has some cough for the last several days.  She was started on Z-Pak 3 days ago as well as nebulizer treatment with no relief.  Today she has worsening shortness of breath and states that it is hard for her to talk in full sentences.  She denies any cough or fevers.  She states that she had a right tibial plateau fracture about 9 weeks ago and has been seeing orthopedic doctor and has been managed conservatively.  She states that she was recently told that she can bear weight on the leg now has been starting physical therapy.  Denies worsening leg swelling or calf pain.  Patient is on Eliquis chronically but daughter states that she had a blood clot in her lungs despite being on Eliquis several years ago so was concerned for possible blood clot.  The history is provided by the patient, the nursing home and a relative.    Past Medical History:  Diagnosis Date  . Arthritis    "hands and legs" (10/09/2012)  . Atrial fibrillation ([place])   . Breast cancer ([place]) ~ 2006   "left" (10/09/2012)  . Cervical spondylolysis 09/21/2012  . Chronic lower back pain   . Compression fracture 09/21/2012  . COPD (chronic obstructive pulmonary disease) ([place])   . Dyslipidemia 09/21/2012  . Heart murmur   . History of blood transfusion    "years and years ago; I was anemic" (10/09/2012)  . Hypertension   . MVP (mitral valve prolapse) 09/23/2012  . Osteoarthritis 09/21/2012  . Osteoporosis   . Scoliosis   . Shortness of breath    "just now; nose is dry" (10/09/2012)    Patient Active Problem List   Diagnosis Date Noted  . COPD with  acute exacerbation ([place]) 01/27/2017  . HCAP (healthcare-associated pneumonia) 01/27/2017  . Chronic diastolic CHF (congestive heart failure) ([place]) 01/27/2017  . CKD (chronic kidney disease), stage III ([place]) 01/27/2017  . Acute lower UTI 11/26/2016  . Falls 11/26/2016  . Cellulitis 11/26/2016  . Osteopenia 12/30/2015  . Atelectasis 08/30/2015  . Dyspnea on exertion 04/04/2015  . Chronic pulmonary embolism ([place]) 04/04/2015  . Acute respiratory failure with hypoxia ([place]) 09/14/2014  . Lobar pneumonia due to unspecified organism 09/14/2014  . Protein-calorie malnutrition, severe ([place]) 09/14/2014  . Pressure ulcer 09/11/2014  . Breast cancer of upper-outer quadrant of left female breast ([place]) 05/26/2013  . CAD in native artery, non obstructive by cath 2000 09/23/2012  . Dyslipidemia 09/21/2012  . Hypertension 09/21/2012  . PAF (paroxysmal atrial fibrillation), maintaining SR 05/10/2012  . Long term current use of anticoagulant therapy 05/10/2012    Past Surgical History:  Procedure Laterality Date  . BREAST BIOPSY Left ~ 2006  . CARDIAC CATHETERIZATION  11/1998  . CATARACT EXTRACTION W/ INTRAOCULAR LENS IMPLANT Left 2000's  . FEMUR IM NAIL Left 09/21/2012   Procedure: INTRAMEDULLARY (IM) NAIL FEMORAL;  Surgeon: Newt Minion, MD;  Location: [facility];  Service: Orthopedics;  Laterality: Left;  . HERNIA REPAIR  1950's?    OB History    No data available       Home  Medications    Prior to Admission medications   Medication Sig Start Date End Date Taking? Authorizing Provider  ALPRAZolam (XANAX) 0.25 MG tablet Take 1 tablet (0.25 mg total) 2 (two) times daily as needed by mouth for anxiety. Take one tablet by mouth every 12 hours as needed for severe anxiety 11/29/16   Hosie Poisson, MD  amiodarone (PACERONE) 200 MG tablet Take 200 mg by mouth every morning.  12/06/11   Alvstad, Erasmo Downer L, RPH-CPP  atorvastatin (LIPITOR) 40 MG tablet Take 40 mg daily by mouth.    [provider]  calcium-vitamin D (OSCAL WITH D) 500-200 MG-UNIT per tablet Take 1 tablet by mouth every morning.     [provider]  denosumab (PROLIA) 60 MG/ML SOLN injection Inject 60 mg into the skin every 6 (six) months. Administer in upper arm, thigh, or abdomen    [provider]  ELIQUIS 2.5 MG TABS tablet Take 2.5 mg by mouth 2 (two) times daily.  03/10/13   [provider]  feeding supplement, ENSURE ENLIVE, (ENSURE ENLIVE) LIQD Take 237 mLs by mouth 2 (two) times daily between meals. 09/15/14   Hongalgi, Lenis Dickinson, MD  fluticasone furoate-vilanterol (BREO ELLIPTA) 200-25 MCG/INH AEPB Inhale 1 puff into the lungs daily.    [provider]  furosemide (LASIX) 40 MG tablet Take 1 tablet (40 mg total) by mouth daily. 10/14/12   [name], MD  HYDROcodone-acetaminophen (NORCO/VICODIN) 5-325 MG tablet Take 1 tablet every 4 (four) hours as needed by mouth. Patient taking differently: Take 1 tablet by mouth as needed for severe pain.  11/29/16   Hosie Poisson, MD  letrozole [facility]) 2.5 MG tablet Take 1 tablet (2.5 mg total) by mouth daily. 05/31/15   Magrinat, Virgie Dad, MD  lidocaine (LIDODERM) 5 % Place 1 patch onto the skin. UNWRAP AND APPLY 1 PATCH TO SKIN DAILY FOR 12 HOURS AND 12 HOURS OFF 11/22/16   [provider]  Multiple Vitamin (MULTIVITAMIN WITH MINERALS) TABS Take 1 tablet by mouth every morning. Centrum 50+    [provider]  olmesartan (BENICAR) 20 MG tablet Take 20 mg by mouth daily.    [provider]  ondansetron (ZOFRAN) 4 MG tablet Take 4 mg by mouth every 8 (eight) hours as needed for nausea or vomiting.    [provider]  polyethylene glycol (MIRALAX / GLYCOLAX) packet Take 17 g by mouth daily as needed for mild constipation or moderate constipation.     [provider]  potassium chloride SA (K-DUR,KLOR-CON) 20 MEQ tablet Take 20 mEq by mouth daily.  04/13/13   [provider]  traMADol (ULTRAM) 50  MG tablet Take 1 tablet (50 mg total) every 8 (eight) hours as needed by mouth for moderate pain or severe pain. 11/29/16   Hosie Poisson, MD  Vitamin D, Ergocalciferol, (DRISDOL) 50000 UNITS CAPS Take 50,000 Units by mouth every 7 (seven) days.    [provider]    Family History Family History  Problem Relation Age of Onset  . Cancer Mother   . CAD Father   . Cancer Other     Social History Social History   Tobacco Use  . Smoking status: Never Smoker  . Smokeless tobacco: Never Used  Substance Use Topics  . Alcohol use: No  . Drug use: No     Allergies   Codeine   Review of Systems Review of Systems  Respiratory: Positive for shortness of breath.   All other systems  reviewed and are negative.    Physical Exam Updated Vital Signs BP (!) 141/48   Pulse 66   Temp 97.7 F (36.5 C) (Oral)   Resp 15   Ht 5' (1.524 m)   Wt 45.4 kg (100 lb)   SpO2 93%   BMI 19.53 kg/m   Physical Exam  Constitutional: She is oriented to person, place, and time. She appears well-developed and well-nourished.  HENT:  Head: Normocephalic.  Mouth/Throat: Oropharynx is clear and moist.  Eyes: Pupils are equal, round, and reactive to light.  Neck: Normal range of motion.  Cardiovascular: Normal rate.  Pulmonary/Chest:  Slightly tachypneic, diminished bilateral bases   Abdominal: Soft. Bowel sounds are normal.  Musculoskeletal: Normal range of motion.       Right lower leg: Normal.       Left lower leg: Normal.  Trace edema bilateral legs, dec ROM R knee (chronic from recent tibial plateau fracture)   Neurological: She is alert and oriented to person, place, and time.  Skin: Skin is warm.  Psychiatric: She has a normal mood and affect.  Nursing note and vitals reviewed.    ED Treatments / Results  Labs (all labs ordered are listed, but only abnormal results are displayed) Labs Reviewed  CBC WITH DIFFERENTIAL/PLATELET - Abnormal; Notable for the following  components:      Result Value   RBC 3.60 (*)    MCV 102.8 (*)    Lymphs Abs 0.5 (*)    All other components within normal limits  COMPREHENSIVE METABOLIC PANEL - Abnormal; Notable for the following components:   Chloride 100 (*)    Glucose, Bld 133 (*)    BUN 44 (*)    Creatinine, Ser 1.35 (*)    Calcium 8.7 (*)    AST 54 (*)    GFR calc non Af Amer 33 (*)    GFR calc Af Amer 39 (*)    All other components within normal limits  BRAIN NATRIURETIC PEPTIDE - Abnormal; Notable for the following components:   B Natriuretic Peptide 429.0 (*)    All other components within normal limits  I-STAT CHEM 8, ED - Abnormal; Notable for the following components:   Potassium 5.2 (*)    Chloride 97 (*)    BUN 60 (*)    Creatinine, Ser 1.40 (*)    Glucose, Bld 128 (*)    Calcium, Ion 1.11 (*)    All other components within normal limits  CULTURE, BLOOD (ROUTINE X 2)  CULTURE, BLOOD (ROUTINE X 2)  CULTURE, EXPECTORATED SPUTUM-ASSESSMENT  GRAM STAIN  STREP PNEUMONIAE URINARY ANTIGEN  PROCALCITONIN  BASIC METABOLIC PANEL  CBC WITH DIFFERENTIAL/PLATELET  PROCALCITONIN  I-STAT TROPONIN, ED    EKG  EKG Interpretation  Date/Time:  Sunday January 27 2017 17:01:09 EST Ventricular Rate:  87 PR Interval:    QRS Duration: 90 QT Interval:  394 QTC Calculation: 474 R Axis:   107 Text Interpretation:  Atrial flutter with variable A-V block Lateral infarct , age undetermined Abnormal ECG No significant change since last tracing Confirmed by Wandra Arthurs [phone]) on 01/27/2017 5:23:48 PM       Radiology Dg Chest 2 View  Result Date: 01/27/2017 CLINICAL DATA:  Chest and BILATERAL rib discomfort with deep inspiration for 1 day, recent URI, history atrial fibrillation, breast cancer, COPD, hypertension EXAM: CHEST  2 VIEW COMPARISON:  11/26/2016 FINDINGS: Enlargement of cardiac silhouette. Atherosclerotic calcification aorta. Mediastinal contours and pulmonary vascularity normal. Lungs emphysematous  but  clear. No pulmonary infiltrate, pleural effusion or pneumothorax. Diffuse osseous demineralization with dextroconvex thoracolumbar scoliosis and multiple thoracolumbar compression fractures. Two prior spinal augmentation procedures, at adjacent levels of the lower thoracic spine. IMPRESSION: Enlargement of cardiac silhouette. COPD changes without acute infiltrate. No definite acute bony findings. Osseous demineralization with thoracolumbar scoliosis, multiple vertebral compression fractures and two prior vertebroplasties. Electronically Signed   By: Lavonia Dana M.D.   On: 01/27/2017 17:00   Ct Angio Chest Pe W And/or Wo Contrast  Result Date: 01/27/2017 CLINICAL DATA:  Shortness of breath.  Recent knee fracture. EXAM: CT ANGIOGRAPHY CHEST WITH CONTRAST TECHNIQUE: Multidetector CT imaging of the chest was performed using the standard protocol during bolus administration of intravenous contrast. Multiplanar CT image reconstructions and MIPs were obtained to evaluate the vascular anatomy. CONTRAST:  <See Chart> ISOVUE-370 IOPAMIDOL (ISOVUE-370) INJECTION 76% COMPARISON:  04/12/2015 FINDINGS: Cardiovascular: The heart is enlarged. No thoracic aortic aneurysm. No filling defect in the opacified pulmonary arteries to suggest the presence of an acute pulmonary embolus. Mediastinum/Nodes: No mediastinal lymphadenopathy. There is no hilar lymphadenopathy. There is no axillary lymphadenopathy. Moderate hiatal hernia. Lungs/Pleura: 10 mm ground-glass nodule in the right upper lobe is more prominent than on the prior study. Calcified granuloma identified posterior right upper lobe. 3 mm right upper lobe nodule seen on image 37 is new in the interval. There is a new 15 mm ground-glass nodule in the anterior right upper lobe on image 53. Bilateral lower lobe collapse/consolidation has progressed slightly in the interval. No pulmonary edema. Upper Abdomen: 17 mm mildly hyperattenuating lesion in the upper pole the left  kidney is stable Musculoskeletal: Bones are diffusely demineralized. Patient is status post multilevel thoracic vertebral augmentation. Chronic compression deformity is seen at T10, T11, and L2. T6 compression deformity is also stable since prior study. Review of the MIP images confirms the above findings. IMPRESSION: 1. No CT evidence for acute pulmonary embolus. 2. Bilateral lower lobe collapse/consolidation, slightly progressed in the interval. 3. Two ground-glass nodules in the right lung. 15 mm ground-glass nodule is new in the interval. Non-contrast chest CT at 3-6 months is recommended. If the nodules are stable at time of repeat CT, then future CT at 18-24 months (from today's scan) is considered optional for low-risk patients, but is recommended for high-risk patients. This recommendation follows the consensus statement: Guidelines for Management of Incidental Pulmonary Nodules Detected on CT Images: From the Fleischner Society 2017; Radiology 2017; 284:228-243. 4. New 3 mm right upper lobe pulmonary nodule. This could be reassessed at the time of followup CT. Electronically Signed   By: Misty Stanley M.D.   On: 01/27/2017 18:40    Procedures Procedures (including critical care time)  Medications Ordered in ED Medications  sodium chloride 0.9 % injection (  Not Given 01/27/17 1810)  vancomycin (VANCOCIN) IVPB 1000 mg/200 mL premix (not administered)  ceFEPIme (MAXIPIME) 1 g in dextrose 5 % 50 mL IVPB (not administered)  ALPRAZolam (XANAX) tablet 0.25 mg (not administered)  amiodarone (PACERONE) tablet 200 mg (not administered)  atorvastatin (LIPITOR) tablet 40 mg (not administered)  calcium-vitamin D (OSCAL WITH D) 500-200 MG-UNIT per tablet 1 tablet (not administered)  apixaban (ELIQUIS) tablet 2.5 mg (not administered)  feeding supplement (ENSURE ENLIVE) (ENSURE ENLIVE) liquid 237 mL (not administered)  fluticasone furoate-vilanterol (BREO ELLIPTA) 200-25 MCG/INH 1 puff (not administered)    furosemide (LASIX) tablet 40 mg (not administered)  HYDROcodone-acetaminophen (NORCO/VICODIN) 5-325 MG per tablet 1-2 tablet (not administered)  letrozole (FEMARA) tablet 2.5  mg (not administered)  lidocaine (LIDODERM) 5 % 1 patch (not administered)  multivitamin with minerals tablet 1 tablet (not administered)  irbesartan (AVAPRO) tablet 150 mg (not administered)  polyethylene glycol (MIRALAX / GLYCOLAX) packet 17 g (not administered)  potassium chloride SA (K-DUR,KLOR-CON) CR tablet 20 mEq (not administered)  iopamidol (ISOVUE-370) 76 % injection (80 mLs Intravenous Contrast Given 01/27/17 1749)     Initial Impression / Assessment and Plan / ED Course  I have reviewed the triage vital signs and the nursing notes.  Pertinent labs & imaging results that were available during my care of the patient were reviewed by me and considered in my medical decision making (see chart for details).     Holly Weaver is a 84 y.o. female here with SOB. Well appearing for age. She is slightly tachypneic but has no wheezing. Consider CHF exacerbation. Daughter concerned about PE but patient on eliquis. She states that she had PE on eliquis before so will get CTA to r/o PE. Will check BNP and labs as well.   7:33 PM WBC nl. CXR clear. CTA showed bilateral consolidations. Patient states that she is still short of breath and unable to go back to rehab. She was recently admitted so will add on lactate, culture. Ordered vanc/cefepime per HCAP protocol. Will admit for observation   Final Clinical Impressions(s) / ED Diagnoses   Final diagnoses:  None    ED Discharge Orders    None       Drenda Freeze, MD 01/27/17 [phone]

## 2017-01-27 NOTE — H&P (Signed)
History and Physical    Holly Weaver MBW:[id number] DOB: 1923/02/05 DOA: 01/27/2017  PCP: [name], MD   Patient coming from: SNF   Chief Complaint: SOB, cough   HPI: Holly Weaver is a 84 y.o. female with medical history significant for atrial fibrillation on Eliquis, chronic diastolic CHF, and breast cancer, now presenting to the emergency department for evaluation of worsening cough and shortness of breath despite treatment with azithromycin and nebulizers.  She is currently convalescing at a SNF after right tibial plateau fracture, developed dyspnea and cough a few days ago, was diagnosed with bronchitis and started on azithromycin.  She has taken 3 doses, also using nebulized albuterol, but continued to worsen, prompting her presentation to the ED tonight.  ED Course: Upon arrival to the ED, patient is found to be afebrile, saturating well on room air, and with vitals otherwise normal. EKG with rate-controlled a fib. Labs with stable creatinine at 1.35, elevated BNP to 429, normal troponin. CTA negative for PE, but notable for basilar opacities concerning for pneumonia, as well as incidental lung nodules. She was started on empiric vancomycin and cefepime in the ED, remained hemodynamically stable, not in acute distress, and will be observed on med-surg for ongoing evaluation and management of HCAP.   Review of Systems:  All other systems reviewed and apart from HPI, are negative.  Past Medical History:  Diagnosis Date  . Arthritis    "hands and legs" (10/09/2012)  . Atrial fibrillation ([place])   . Breast cancer ([place]) ~ 2006   "left" (10/09/2012)  . Cervical spondylolysis 09/21/2012  . Chronic lower back pain   . Compression fracture 09/21/2012  . COPD (chronic obstructive pulmonary disease) ([place])   . Dyslipidemia 09/21/2012  . Heart murmur   . History of blood transfusion    "years and years ago; I was anemic" (10/09/2012)  . Hypertension   . MVP (mitral valve  prolapse) 09/23/2012  . Osteoarthritis 09/21/2012  . Osteoporosis   . Scoliosis   . Shortness of breath    "just now; nose is dry" (10/09/2012)    Past Surgical History:  Procedure Laterality Date  . BREAST BIOPSY Left ~ 2006  . CARDIAC CATHETERIZATION  11/1998  . CATARACT EXTRACTION W/ INTRAOCULAR LENS IMPLANT Left 2000's  . FEMUR IM NAIL Left 09/21/2012   Procedure: INTRAMEDULLARY (IM) NAIL FEMORAL;  Surgeon: Newt Minion, MD;  Location: [facility];  Service: Orthopedics;  Laterality: Left;  . HERNIA REPAIR  1950's?     reports that  has never smoked. she has never used smokeless tobacco. She reports that she does not drink alcohol or use drugs.  Allergies  Allergen Reactions  . Codeine Nausea And Vomiting    Family History  Problem Relation Age of Onset  . Cancer Mother   . CAD Father   . Cancer Other      Prior to Admission medications   Medication Sig Start Date End Date Taking? Authorizing Provider  ALPRAZolam (XANAX) 0.25 MG tablet Take 1 tablet (0.25 mg total) 2 (two) times daily as needed by mouth for anxiety. Take one tablet by mouth every 12 hours as needed for severe anxiety 11/29/16   Hosie Poisson, MD  amiodarone (PACERONE) 200 MG tablet Take 200 mg by mouth every morning.  12/06/11   Alvstad, Erasmo Downer L, RPH-CPP  atorvastatin (LIPITOR) 40 MG tablet Take 40 mg daily by mouth.    [provider]  calcium-vitamin D (OSCAL WITH D) 500-200 MG-UNIT per tablet  Take 1 tablet by mouth every morning.     [provider]  denosumab (PROLIA) 60 MG/ML SOLN injection Inject 60 mg into the skin every 6 (six) months. Administer in upper arm, thigh, or abdomen    [provider]  ELIQUIS 2.5 MG TABS tablet Take 2.5 mg by mouth 2 (two) times daily.  03/10/13   [provider]  feeding supplement, ENSURE ENLIVE, (ENSURE ENLIVE) LIQD Take 237 mLs by mouth 2 (two) times daily between meals. 09/15/14   Hongalgi, Lenis Dickinson, MD  fluticasone furoate-vilanterol  (BREO ELLIPTA) 200-25 MCG/INH AEPB Inhale 1 puff into the lungs daily.    [provider]  furosemide (LASIX) 40 MG tablet Take 1 tablet (40 mg total) by mouth daily. 10/14/12   [name], MD  HYDROcodone-acetaminophen (NORCO/VICODIN) 5-325 MG tablet Take 1 tablet every 4 (four) hours as needed by mouth. Patient taking differently: Take 1 tablet by mouth as needed for severe pain.  11/29/16   Hosie Poisson, MD  letrozole [facility]) 2.5 MG tablet Take 1 tablet (2.5 mg total) by mouth daily. 05/31/15   Magrinat, Virgie Dad, MD  lidocaine (LIDODERM) 5 % Place 1 patch onto the skin. UNWRAP AND APPLY 1 PATCH TO SKIN DAILY FOR 12 HOURS AND 12 HOURS OFF 11/22/16   [provider]  Multiple Vitamin (MULTIVITAMIN WITH MINERALS) TABS Take 1 tablet by mouth every morning. Centrum 50+    [provider]  olmesartan (BENICAR) 20 MG tablet Take 20 mg by mouth daily.    [provider]  ondansetron (ZOFRAN) 4 MG tablet Take 4 mg by mouth every 8 (eight) hours as needed for nausea or vomiting.    [provider]  polyethylene glycol (MIRALAX / GLYCOLAX) packet Take 17 g by mouth daily as needed for mild constipation or moderate constipation.     [provider]  potassium chloride SA (K-DUR,KLOR-CON) 20 MEQ tablet Take 20 mEq by mouth daily.  04/13/13   [provider]  traMADol (ULTRAM) 50 MG tablet Take 1 tablet (50 mg total) every 8 (eight) hours as needed by mouth for moderate pain or severe pain. 11/29/16   Hosie Poisson, MD  Vitamin D, Ergocalciferol, (DRISDOL) 50000 UNITS CAPS Take 50,000 Units by mouth every 7 (seven) days.    [provider]    Physical Exam: Vitals:   01/27/17 1624 01/27/17 1625 01/27/17 1740 01/27/17 1800  BP: (!) 110/55  115/63 (!) 141/48  Pulse: (!) 104  92 66  Resp: 16  20 15   Temp: 97.7 F (36.5 C)     TempSrc: Oral     SpO2: 100% 100% 98% 93%  Weight:  45.4 kg (100 lb)    Height:  5' (1.524 m)         Constitutional: NAD, calm  Eyes: PERTLA, lids and conjunctivae normal ENMT: Mucous membranes are moist. Posterior pharynx clear of any exudate or lesions.   Neck: normal, supple, no masses, no thyromegaly Respiratory: Rhonchi at bases, R>L, no wheezing, no crackles. No accessory muscle use.  Cardiovascular: Rate ~80 and irregularly irregular. No significant JVD. Abdomen: No distension, no tenderness, no masses palpated. Bowel sounds normal.  Musculoskeletal: no clubbing / cyanosis. No joint deformity upper and lower extremities.   Skin: no significant rashes, lesions, ulcers. Warm, dry, well-perfused. Neurologic: CN 2-12 grossly intact. Sensation intact. Strength 5/5 in all 4 limbs.  Psychiatric: Alert and oriented x 3. Pleasant and cooperative.     Labs on Admission: I have  personally reviewed following labs and imaging studies  CBC: Recent Labs  Lab 01/27/17 1655 01/27/17 1705  WBC 7.1  --   NEUTROABS 5.8  --   HGB 12.2 12.2  HCT 37.0 36.0  MCV 102.8*  --   PLT 236  --    Basic Metabolic Panel: Recent Labs  Lab 01/27/17 1655 01/27/17 1705  NA 135 137  K 4.6 5.2*  CL 100* 97*  CO2 28  --   GLUCOSE 133* 128*  BUN 44* 60*  CREATININE 1.35* 1.40*  CALCIUM 8.7*  --    GFR: Estimated Creatinine Clearance: 18.8 mL/min (A) (by C-G formula based on SCr of 1.4 mg/dL (H)). Liver Function Tests: Recent Labs  Lab 01/27/17 1655  AST 54*  ALT 33  ALKPHOS 67  BILITOT 0.9  PROT 6.8  ALBUMIN 3.5   No results for input(s): LIPASE, AMYLASE in the last 168 hours. No results for input(s): AMMONIA in the last 168 hours. Coagulation Profile: No results for input(s): INR, PROTIME in the last 168 hours. Cardiac Enzymes: No results for input(s): CKTOTAL, CKMB, CKMBINDEX, TROPONINI in the last 168 hours. BNP (last 3 results) No results for input(s): PROBNP in the last 8760 hours. HbA1C: No results for input(s): HGBA1C in the last 72 hours. CBG: No results for  input(s): GLUCAP in the last 168 hours. Lipid Profile: No results for input(s): CHOL, HDL, LDLCALC, TRIG, CHOLHDL, LDLDIRECT in the last 72 hours. Thyroid Function Tests: No results for input(s): TSH, T4TOTAL, FREET4, T3FREE, THYROIDAB in the last 72 hours. Anemia Panel: No results for input(s): VITAMINB12, FOLATE, FERRITIN, TIBC, IRON, RETICCTPCT in the last 72 hours. Urine analysis:    Component Value Date/Time   COLORURINE YELLOW 11/28/2016 2045   APPEARANCEUR HAZY (A) 11/28/2016 2045   LABSPEC 1.011 11/28/2016 2045   PHURINE 7.0 11/28/2016 2045   GLUCOSEU NEGATIVE 11/28/2016 2045   HGBUR SMALL (A) 11/28/2016 2045   BILIRUBINUR NEGATIVE 11/28/2016 2045   KETONESUR NEGATIVE 11/28/2016 2045   PROTEINUR NEGATIVE 11/28/2016 2045   UROBILINOGEN 1.0 09/11/2014 0111   NITRITE NEGATIVE 11/28/2016 2045   LEUKOCYTESUR LARGE (A) 11/28/2016 2045   Sepsis Labs: @LABRCNTIP (procalcitonin:4,lacticidven:4) )No results found for this or any previous visit (from the past 240 hour(s)).   Radiological Exams on Admission: Dg Chest 2 View  Result Date: 01/27/2017 CLINICAL DATA:  Chest and BILATERAL rib discomfort with deep inspiration for 1 day, recent URI, history atrial fibrillation, breast cancer, COPD, hypertension EXAM: CHEST  2 VIEW COMPARISON:  11/26/2016 FINDINGS: Enlargement of cardiac silhouette. Atherosclerotic calcification aorta. Mediastinal contours and pulmonary vascularity normal. Lungs emphysematous but clear. No pulmonary infiltrate, pleural effusion or pneumothorax. Diffuse osseous demineralization with dextroconvex thoracolumbar scoliosis and multiple thoracolumbar compression fractures. Two prior spinal augmentation procedures, at adjacent levels of the lower thoracic spine. IMPRESSION: Enlargement of cardiac silhouette. COPD changes without acute infiltrate. No definite acute bony findings. Osseous demineralization with thoracolumbar scoliosis, multiple vertebral compression fractures  and two prior vertebroplasties. Electronically Signed   By: Lavonia Dana M.D.   On: 01/27/2017 17:00   Ct Angio Chest Pe W And/or Wo Contrast  Result Date: 01/27/2017 CLINICAL DATA:  Shortness of breath.  Recent knee fracture. EXAM: CT ANGIOGRAPHY CHEST WITH CONTRAST TECHNIQUE: Multidetector CT imaging of the chest was performed using the standard protocol during bolus administration of intravenous contrast. Multiplanar CT image reconstructions and MIPs were obtained to evaluate the vascular anatomy. CONTRAST:  <See Chart> ISOVUE-370 IOPAMIDOL (ISOVUE-370) INJECTION 76% COMPARISON:  04/12/2015 FINDINGS: Cardiovascular: The  heart is enlarged. No thoracic aortic aneurysm. No filling defect in the opacified pulmonary arteries to suggest the presence of an acute pulmonary embolus. Mediastinum/Nodes: No mediastinal lymphadenopathy. There is no hilar lymphadenopathy. There is no axillary lymphadenopathy. Moderate hiatal hernia. Lungs/Pleura: 10 mm ground-glass nodule in the right upper lobe is more prominent than on the prior study. Calcified granuloma identified posterior right upper lobe. 3 mm right upper lobe nodule seen on image 37 is new in the interval. There is a new 15 mm ground-glass nodule in the anterior right upper lobe on image 53. Bilateral lower lobe collapse/consolidation has progressed slightly in the interval. No pulmonary edema. Upper Abdomen: 17 mm mildly hyperattenuating lesion in the upper pole the left kidney is stable Musculoskeletal: Bones are diffusely demineralized. Patient is status post multilevel thoracic vertebral augmentation. Chronic compression deformity is seen at T10, T11, and L2. T6 compression deformity is also stable since prior study. Review of the MIP images confirms the above findings. IMPRESSION: 1. No CT evidence for acute pulmonary embolus. 2. Bilateral lower lobe collapse/consolidation, slightly progressed in the interval. 3. Two ground-glass nodules in the right lung. 15 mm  ground-glass nodule is new in the interval. Non-contrast chest CT at 3-6 months is recommended. If the nodules are stable at time of repeat CT, then future CT at 18-24 months (from today's scan) is considered optional for low-risk patients, but is recommended for high-risk patients. This recommendation follows the consensus statement: Guidelines for Management of Incidental Pulmonary Nodules Detected on CT Images: From the Fleischner Society 2017; Radiology 2017; 284:228-243. 4. New 3 mm right upper lobe pulmonary nodule. This could be reassessed at the time of followup CT. Electronically Signed   By: Misty Stanley M.D.   On: 01/27/2017 18:40    EKG: Independently reviewed. Atrial fibrillation.   Assessment/Plan:  1. HCAP  - Presents with worsening cough and SOB despite treatment with azithromycin at her SNF  - Imaging consistent with PNA  - Started on vancomycin and cefepime in ED  - Check sputum culture, strep pneumo antigen, and procalcitonin  - Continue current abx while following cultures and clinical course    2. Chronic diastolic CHF  - Euvolemic on admission  - Follow daily wts  - Continue Lasix and ARB   3. Paroxysmal atrial fibrillation  - In rate-controlled atrial fibrillation on admission  - CHADS-VASc is 35 (age x2, gender, CHF, CAD)  - Continue Eliquis and amiodarone    4. CAD - No anginal complaints  - Continue Lipitor and ARB    5. CKD stage III  - SCr is 1.35 on admission, consistent with her apparent baseline  - Renally-dose medications    6. Breast cancer - Follows with oncology and managed with letrozole, will continue    DVT prophylaxis: Eliquis  Code Status: Full  Family Communication: Daughter updated at bedside Disposition Plan: Observe on med-surg Consults called: None  Admission status: Observation    Vianne Bulls, MD [facility] Pager [phone]  If 7PM-7AM, please contact night-coverage [email or web address] Password [facility]  01/27/2017,  7:32 PM

## 2017-01-27 NOTE — Progress Notes (Signed)
Pharmacy Antibiotic Note  Holly Weaver is a 84 y.o. female admitted on 01/27/2017 with pneumonia.  Pharmacy has been consulted for vancomycin and cefepime dosing.  Plan: Cefepime 1g IV x 1, then 500mg  IV q24h Vancomycin 1g IV x 1, then 500mg  IV q48h (estimated AUC 424) Check vancomycin peak and trough at steady state, goal 400-500 Follow up renal function & cultures  Height: 5' (152.4 cm) Weight: 100 lb (45.4 kg) IBW/kg (Calculated) : 45.5  Temp (24hrs), Avg:97.7 F (36.5 C), Min:97.7 F (36.5 C), Max:97.7 F (36.5 C)  Recent Labs  Lab 01/27/17 1655 01/27/17 1705  WBC 7.1  --   CREATININE 1.35* 1.40*    Estimated Creatinine Clearance: 18.8 mL/min (A) (by C-G formula based on SCr of 1.4 mg/dL (H)).    Allergies  Allergen Reactions  . Codeine Nausea And Vomiting    Antimicrobials this admission:  Zpack PTA 1/6 Vanc >> 1/6 Cefepime >>  Dose adjustments this admission:    Microbiology results:  1/6 BCx: 1/6 Sputum: 1/6 MRSA PCR:  Thank you for allowing pharmacy to be a part of this patient's care.  Peggyann Juba, PharmD, BCPS Pager: [phone] 01/27/2017 7:44 PM

## 2017-01-28 DIAGNOSIS — I5032 Chronic diastolic (congestive) heart failure: Secondary | ICD-10-CM

## 2017-01-28 DIAGNOSIS — I480 Paroxysmal atrial fibrillation: Secondary | ICD-10-CM | POA: Diagnosis not present

## 2017-01-28 DIAGNOSIS — Z9842 Cataract extraction status, left eye: Secondary | ICD-10-CM | POA: Diagnosis not present

## 2017-01-28 DIAGNOSIS — M6281 Muscle weakness (generalized): Secondary | ICD-10-CM | POA: Diagnosis not present

## 2017-01-28 DIAGNOSIS — N183 Chronic kidney disease, stage 3 (moderate): Secondary | ICD-10-CM | POA: Diagnosis not present

## 2017-01-28 DIAGNOSIS — J440 Chronic obstructive pulmonary disease with acute lower respiratory infection: Secondary | ICD-10-CM | POA: Diagnosis present

## 2017-01-28 DIAGNOSIS — Z961 Presence of intraocular lens: Secondary | ICD-10-CM | POA: Diagnosis present

## 2017-01-28 DIAGNOSIS — J189 Pneumonia, unspecified organism: Secondary | ICD-10-CM | POA: Diagnosis not present

## 2017-01-28 DIAGNOSIS — J80 Acute respiratory distress syndrome: Secondary | ICD-10-CM | POA: Diagnosis not present

## 2017-01-28 DIAGNOSIS — Z79899 Other long term (current) drug therapy: Secondary | ICD-10-CM | POA: Diagnosis not present

## 2017-01-28 DIAGNOSIS — I130 Hypertensive heart and chronic kidney disease with heart failure and stage 1 through stage 4 chronic kidney disease, or unspecified chronic kidney disease: Secondary | ICD-10-CM | POA: Diagnosis present

## 2017-01-28 DIAGNOSIS — R0602 Shortness of breath: Secondary | ICD-10-CM | POA: Diagnosis not present

## 2017-01-28 DIAGNOSIS — R110 Nausea: Secondary | ICD-10-CM | POA: Diagnosis present

## 2017-01-28 DIAGNOSIS — Z7901 Long term (current) use of anticoagulants: Secondary | ICD-10-CM | POA: Diagnosis not present

## 2017-01-28 DIAGNOSIS — Z853 Personal history of malignant neoplasm of breast: Secondary | ICD-10-CM | POA: Diagnosis not present

## 2017-01-28 DIAGNOSIS — R488 Other symbolic dysfunctions: Secondary | ICD-10-CM | POA: Diagnosis not present

## 2017-01-28 DIAGNOSIS — Y95 Nosocomial condition: Secondary | ICD-10-CM | POA: Diagnosis present

## 2017-01-28 DIAGNOSIS — I2510 Atherosclerotic heart disease of native coronary artery without angina pectoris: Secondary | ICD-10-CM

## 2017-01-28 DIAGNOSIS — I10 Essential (primary) hypertension: Secondary | ICD-10-CM | POA: Diagnosis not present

## 2017-01-28 DIAGNOSIS — R2689 Other abnormalities of gait and mobility: Secondary | ICD-10-CM | POA: Diagnosis not present

## 2017-01-28 DIAGNOSIS — E785 Hyperlipidemia, unspecified: Secondary | ICD-10-CM | POA: Diagnosis present

## 2017-01-28 DIAGNOSIS — M810 Age-related osteoporosis without current pathological fracture: Secondary | ICD-10-CM | POA: Diagnosis present

## 2017-01-28 DIAGNOSIS — R278 Other lack of coordination: Secondary | ICD-10-CM | POA: Diagnosis not present

## 2017-01-28 LAB — CBC WITH DIFFERENTIAL/PLATELET
BASOS ABS: 0 10*3/uL (ref 0.0–0.1)
Basophils Relative: 0 %
Eosinophils Absolute: 0.1 10*3/uL (ref 0.0–0.7)
Eosinophils Relative: 2 %
HEMATOCRIT: 32.7 % — AB (ref 36.0–46.0)
Hemoglobin: 10.9 g/dL — ABNORMAL LOW (ref 12.0–15.0)
LYMPHS ABS: 0.6 10*3/uL — AB (ref 0.7–4.0)
LYMPHS PCT: 11 %
MCH: 34.4 pg — AB (ref 26.0–34.0)
MCHC: 33.3 g/dL (ref 30.0–36.0)
MCV: 103.2 fL — AB (ref 78.0–100.0)
MONO ABS: 0.7 10*3/uL (ref 0.1–1.0)
Monocytes Relative: 13 %
NEUTROS ABS: 4.1 10*3/uL (ref 1.7–7.7)
Neutrophils Relative %: 74 %
Platelets: 216 10*3/uL (ref 150–400)
RBC: 3.17 MIL/uL — AB (ref 3.87–5.11)
RDW: 15.3 % (ref 11.5–15.5)
WBC: 5.5 10*3/uL (ref 4.0–10.5)

## 2017-01-28 LAB — RESPIRATORY PANEL BY PCR
Adenovirus: NOT DETECTED
BORDETELLA PERTUSSIS-RVPCR: NOT DETECTED
CHLAMYDOPHILA PNEUMONIAE-RVPPCR: NOT DETECTED
CORONAVIRUS NL63-RVPPCR: NOT DETECTED
Coronavirus 229E: NOT DETECTED
Coronavirus HKU1: NOT DETECTED
Coronavirus OC43: NOT DETECTED
INFLUENZA A-RVPPCR: NOT DETECTED
Influenza B: NOT DETECTED
METAPNEUMOVIRUS-RVPPCR: NOT DETECTED
Mycoplasma pneumoniae: NOT DETECTED
PARAINFLUENZA VIRUS 3-RVPPCR: NOT DETECTED
Parainfluenza Virus 1: NOT DETECTED
Parainfluenza Virus 2: NOT DETECTED
Parainfluenza Virus 4: NOT DETECTED
Respiratory Syncytial Virus: NOT DETECTED
Rhinovirus / Enterovirus: NOT DETECTED

## 2017-01-28 LAB — BASIC METABOLIC PANEL
Anion gap: 7 (ref 5–15)
BUN: 37 mg/dL — ABNORMAL HIGH (ref 6–20)
CO2: 27 mmol/L (ref 22–32)
Calcium: 8.6 mg/dL — ABNORMAL LOW (ref 8.9–10.3)
Chloride: 102 mmol/L (ref 101–111)
Creatinine, Ser: 1.21 mg/dL — ABNORMAL HIGH (ref 0.44–1.00)
GFR calc Af Amer: 44 mL/min — ABNORMAL LOW (ref >60)
GFR, EST NON AFRICAN AMERICAN: 38 mL/min — AB (ref >60)
GLUCOSE: 93 mg/dL (ref 65–99)
POTASSIUM: 4.1 mmol/L (ref 3.5–5.1)
Sodium: 136 mmol/L (ref 135–145)

## 2017-01-28 LAB — PROCALCITONIN: Procalcitonin: <0.1 ng/mL

## 2017-01-28 LAB — STREP PNEUMONIAE URINARY ANTIGEN: STREP PNEUMO URINARY ANTIGEN: NEGATIVE

## 2017-01-28 LAB — MRSA PCR SCREENING: MRSA by PCR: NEGATIVE

## 2017-01-28 NOTE — Progress Notes (Signed)
PROGRESS NOTE    Holly Weaver  IRJ:[id number] DOB: 12/31/1923 DOA: 01/27/2017 PCP: [name], MD   Brief Narrative: 84 year old female with history of  atrial fibrillation on Eliquis, chronic diastolic CHF, breast cancer presented to the ER with worsening cough, shortness of breath despite treatment with azithromycin and nebulizer at a skilled nursing home.  In the ER CT noted to have bibasal opacity concerning for pneumonia.  Assessment & Plan:   #Healthcare associated pneumonia: Patient presented with shortness of breath and dyspnea.  CT chest with bibasilar opacity.  MRSA negative.  Discontinue vancomycin continue cefepime for now.  Continue to follow-up culture results.  Respiratory virus negative.  Pro-calcitonin level low doubt patient has bacterial infection.  Continue to monitor for now.  Patient is still coughing, not at baseline.  #Chronic diastolic congestive heart failure: Euvolemic on exam.  Continue oral Lasix R plan cardiac medication.  Patient does not have chest pain.  #Paroxysmal atrial fibrillation: Continue Eliquis, amiodarone.  Monitor heart rate.  #History of coronary artery disease: No chest pain.  #Chronic kidney disease stage III: Serum creatinine level around baseline.  Monitor BMP.  Avoid nephrotoxins.  #History of breast cancer follow-up with oncology.  Patient is from a skilled nursing home.  PT OT ordered.  Consulted Education officer, museum for [facility] discharge plan.  DVT prophylaxis: Eliquis Code Status: Full code Family Communication: No family at bedside Disposition Plan: Likely discharge to skilled nursing home in 1-2 days    Consultants:   None  Procedures: None Antimicrobials: Cefepime  Subjective: Seen and examined at bedside.  Still having some cough, weakness and not feeling well.  Reported not at baseline.  Denies chest pain, nausea vomiting or headache.  Objective: Vitals:   01/27/17 2248 01/28/17 0610 01/28/17 1024 01/28/17 1259  BP:  (!) 129/54 (!) 115/41 130/68 127/82  Pulse: 86 78 82 91  Resp: 18 18  16   Temp: 99.1 F (37.3 C) 98.3 F (36.8 C)  (!) 97.2 F (36.2 C)  TempSrc: Oral Oral  Oral  SpO2: 100% 97%  100%  Weight: 47.9 kg (105 lb 9.6 oz) 45.9 kg (101 lb 3.1 oz)    Height: 5\' 4"  (1.626 m)       Intake/Output Summary (Last 24 hours) at 01/28/2017 1438 Last data filed at 01/28/2017 1259 Gross per 24 hour  Intake 490 ml  Output 200 ml  Net 290 ml   Filed Weights   01/27/17 1625 01/27/17 2248 01/28/17 0610  Weight: 45.4 kg (100 lb) 47.9 kg (105 lb 9.6 oz) 45.9 kg (101 lb 3.1 oz)    Examination:  General exam: Appears calm and comfortable  Respiratory system: Bibasilar decreased breath sound, respiratory effort normal, no wheezing appreciated. Cardiovascular system: S1 & S2 heard, RRR.  No pedal edema. Gastrointestinal system: Abdomen is nondistended, soft and nontender. Normal bowel sounds heard. Central nervous system: Alert awake and following commands. Skin: No rashes, lesions or ulcers Psychiatry: Judgement and insight appear age-appropriate. Mood & affect appropriate.     Data Reviewed: I have personally reviewed following labs and imaging studies  CBC: Recent Labs  Lab 01/27/17 1655 01/27/17 1705 01/28/17 0523  WBC 7.1  --  5.5  NEUTROABS 5.8  --  4.1  HGB 12.2 12.2 10.9*  HCT 37.0 36.0 32.7*  MCV 102.8*  --  103.2*  PLT 236  --  301   Basic Metabolic Panel: Recent Labs  Lab 01/27/17 1655 01/27/17 1705 01/28/17 0523  NA 135 137 136  K 4.6 5.2* 4.1  CL 100* 97* 102  CO2 28  --  27  GLUCOSE 133* 128* 93  BUN 44* 60* 37*  CREATININE 1.35* 1.40* 1.21*  CALCIUM 8.7*  --  8.6*   GFR: Estimated Creatinine Clearance: 21.9 mL/min (A) (by C-G formula based on SCr of 1.21 mg/dL (H)). Liver Function Tests: Recent Labs  Lab 01/27/17 1655  AST 54*  ALT 33  ALKPHOS 67  BILITOT 0.9  PROT 6.8  ALBUMIN 3.5   No results for input(s): LIPASE, AMYLASE in the last 168 hours. No  results for input(s): AMMONIA in the last 168 hours. Coagulation Profile: No results for input(s): INR, PROTIME in the last 168 hours. Cardiac Enzymes: No results for input(s): CKTOTAL, CKMB, CKMBINDEX, TROPONINI in the last 168 hours. BNP (last 3 results) No results for input(s): PROBNP in the last 8760 hours. HbA1C: No results for input(s): HGBA1C in the last 72 hours. CBG: No results for input(s): GLUCAP in the last 168 hours. Lipid Profile: No results for input(s): CHOL, HDL, LDLCALC, TRIG, CHOLHDL, LDLDIRECT in the last 72 hours. Thyroid Function Tests: No results for input(s): TSH, T4TOTAL, FREET4, T3FREE, THYROIDAB in the last 72 hours. Anemia Panel: No results for input(s): VITAMINB12, FOLATE, FERRITIN, TIBC, IRON, RETICCTPCT in the last 72 hours. Sepsis Labs: Recent Labs  Lab 01/28/17 0523  PROCALCITON <0.10    Recent Results (from the past 240 hour(s))  MRSA PCR Screening     Status: None   Collection Time: 01/27/17  7:47 PM  Result Value Ref Range Status   MRSA by PCR NEGATIVE NEGATIVE Final    Comment:        The GeneXpert MRSA Assay (FDA approved for NASAL specimens only), is one component of a comprehensive MRSA colonization surveillance program. It is not intended to diagnose MRSA infection nor to guide or monitor treatment for MRSA infections.   Respiratory Panel by PCR     Status: None   Collection Time: 01/28/17  8:24 AM  Result Value Ref Range Status   Adenovirus NOT DETECTED NOT DETECTED Final   Coronavirus 229E NOT DETECTED NOT DETECTED Final   Coronavirus HKU1 NOT DETECTED NOT DETECTED Final   Coronavirus NL63 NOT DETECTED NOT DETECTED Final   Coronavirus OC43 NOT DETECTED NOT DETECTED Final   Metapneumovirus NOT DETECTED NOT DETECTED Final   Rhinovirus / Enterovirus NOT DETECTED NOT DETECTED Final   Influenza A NOT DETECTED NOT DETECTED Final   Influenza B NOT DETECTED NOT DETECTED Final   Parainfluenza Virus 1 NOT DETECTED NOT DETECTED Final    Parainfluenza Virus 2 NOT DETECTED NOT DETECTED Final   Parainfluenza Virus 3 NOT DETECTED NOT DETECTED Final   Parainfluenza Virus 4 NOT DETECTED NOT DETECTED Final   Respiratory Syncytial Virus NOT DETECTED NOT DETECTED Final   Bordetella pertussis NOT DETECTED NOT DETECTED Final   Chlamydophila pneumoniae NOT DETECTED NOT DETECTED Final   Mycoplasma pneumoniae NOT DETECTED NOT DETECTED Final    Comment: Performed at [facility], [place] [address], [place], [place] [address]         Radiology Studies: Dg Chest 2 View  Result Date: 01/27/2017 CLINICAL DATA:  Chest and BILATERAL rib discomfort with deep inspiration for 1 day, recent URI, history atrial fibrillation, breast cancer, COPD, hypertension EXAM: CHEST  2 VIEW COMPARISON:  11/26/2016 FINDINGS: Enlargement of cardiac silhouette. Atherosclerotic calcification aorta. Mediastinal contours and pulmonary vascularity normal. Lungs emphysematous but clear. No pulmonary infiltrate, pleural effusion or pneumothorax. Diffuse  osseous demineralization with dextroconvex thoracolumbar scoliosis and multiple thoracolumbar compression fractures. Two prior spinal augmentation procedures, at adjacent levels of the lower thoracic spine. IMPRESSION: Enlargement of cardiac silhouette. COPD changes without acute infiltrate. No definite acute bony findings. Osseous demineralization with thoracolumbar scoliosis, multiple vertebral compression fractures and two prior vertebroplasties. Electronically Signed   By: Lavonia Dana M.D.   On: 01/27/2017 17:00   Ct Angio Chest Pe W And/or Wo Contrast  Result Date: 01/27/2017 CLINICAL DATA:  Shortness of breath.  Recent knee fracture. EXAM: CT ANGIOGRAPHY CHEST WITH CONTRAST TECHNIQUE: Multidetector CT imaging of the chest was performed using the standard protocol during bolus administration of intravenous contrast. Multiplanar CT image reconstructions and MIPs were obtained to evaluate the vascular anatomy.  CONTRAST:  <See Chart> ISOVUE-370 IOPAMIDOL (ISOVUE-370) INJECTION 76% COMPARISON:  04/12/2015 FINDINGS: Cardiovascular: The heart is enlarged. No thoracic aortic aneurysm. No filling defect in the opacified pulmonary arteries to suggest the presence of an acute pulmonary embolus. Mediastinum/Nodes: No mediastinal lymphadenopathy. There is no hilar lymphadenopathy. There is no axillary lymphadenopathy. Moderate hiatal hernia. Lungs/Pleura: 10 mm ground-glass nodule in the right upper lobe is more prominent than on the prior study. Calcified granuloma identified posterior right upper lobe. 3 mm right upper lobe nodule seen on image 37 is new in the interval. There is a new 15 mm ground-glass nodule in the anterior right upper lobe on image 53. Bilateral lower lobe collapse/consolidation has progressed slightly in the interval. No pulmonary edema. Upper Abdomen: 17 mm mildly hyperattenuating lesion in the upper pole the left kidney is stable Musculoskeletal: Bones are diffusely demineralized. Patient is status post multilevel thoracic vertebral augmentation. Chronic compression deformity is seen at T10, T11, and L2. T6 compression deformity is also stable since prior study. Review of the MIP images confirms the above findings. IMPRESSION: 1. No CT evidence for acute pulmonary embolus. 2. Bilateral lower lobe collapse/consolidation, slightly progressed in the interval. 3. Two ground-glass nodules in the right lung. 15 mm ground-glass nodule is new in the interval. Non-contrast chest CT at 3-6 months is recommended. If the nodules are stable at time of repeat CT, then future CT at 18-24 months (from today's scan) is considered optional for low-risk patients, but is recommended for high-risk patients. This recommendation follows the consensus statement: Guidelines for Management of Incidental Pulmonary Nodules Detected on CT Images: From the Fleischner Society 2017; Radiology 2017; 284:228-243. 4. New 3 mm right upper  lobe pulmonary nodule. This could be reassessed at the time of followup CT. Electronically Signed   By: Misty Stanley M.D.   On: 01/27/2017 18:40        Scheduled Meds: . amiodarone  200 mg Oral q morning - 10a  . apixaban  2.5 mg Oral BID  . atorvastatin  40 mg Oral q1800  . calcium-vitamin D  1 tablet Oral q morning - 10a  . ceFEPime (MAXIPIME) IV  500 mg Intravenous Q24H  . feeding supplement (ENSURE ENLIVE)  237 mL Oral BID BM  . fluticasone furoate-vilanterol  1 puff Inhalation Daily  . furosemide  40 mg Oral Daily  . irbesartan  150 mg Oral Daily  . letrozole  2.5 mg Oral Daily  . lidocaine  1 patch Transdermal Q24H  . multivitamin with minerals  1 tablet Oral q morning - 10a  . potassium chloride SA  20 mEq Oral Daily   Continuous Infusions:   LOS: 0 days    Dron Tanna Furry, MD [facility] Pager [phone]  If 7PM-7AM, please contact night-coverage [email or web address] Password [facility] 01/28/2017, 2:38 PM

## 2017-01-28 NOTE — Clinical Social Work Note (Addendum)
Clinical Social Work Assessment  Patient Details  Name: Holly Weaver MRN: [id number] Date of Birth: 1923-11-05  Date of referral:  01/28/17               Reason for consult:  Facility Placement                Permission sought to share information with:  Family Supports Permission granted to share information::  Yes, Verbal Permission Granted  Name::        Agency::     Relationship::  Daughter  Contact Information:     Housing/Transportation Living arrangements for the past 2 months:  [place] of Information:  Patient, Adult Children Patient Interpreter Needed:  None Criminal Activity/Legal Involvement Pertinent to Current Situation/Hospitalization:  No - Comment as needed Significant Relationships:  Adult Children Lives with:  Self Do you feel safe going back to the place where you live?  Yes Need for family participation in patient care:  Yes (Dependent w/mobility)  Care giving concerns:   Patient admitted for health care acquired pneumonia. Patient daughter is concern about her being discharged before she is "completely healed." Patient daughter reports concern about patient being too weak to rehab due to the pneumonia. She reports she informed SNF weeks ago about her mother coughing systems. Patient daughter express she is happy the facility sent her to the hospital to be checked.   She reports the patient was sent to rehab about eight weeks ago for rehab after having a knee replacement.  Patient daughter prefers another facility but is willing to send the patient back to previous facility if beds are not available.    Social Worker assessment / plan: CSW discussed discharge planning to SNF. Patient and daughter prefer to go to a different facility at discharge if possible. Patient and daughter both understands SNF process and bed search. CSW will clarify the number of rehab days medicare will continue to insure. CSW will complete FL2 and send  clinical information to facilities in [place] area. CSW will follow up w/ bed offers.   Plan: Assist with discharge to SNF.    Employment status:  Retired Forensic scientist:  Medicare PT Recommendations:  [place] / Referral to community resources:  [place]  Patient/Family's Response to care:  Agreeable and Responding well to care. Patient and daughter appreciative of CSW services.   Patient/Family's Understanding of and Emotional Response to Diagnosis, Current Treatment, and Prognosis:  Patient daughter knowledgeable of patient current diagnosis and treatment. She request to be updates about patient care.   Emotional Assessment Appearance:  Appears stated age Attitude/Demeanor/Rapport:    Affect (typically observed):  Accepting, Pleasant Orientation:  Oriented to Self, Oriented to Place, Oriented to  Time, Oriented to Situation Alcohol / Substance use:  Not Applicable Psych involvement (Current and /or in the community):  No (Comment)  Discharge Needs  Concerns to be addressed:  Discharge Planning Concerns Readmission within the last 30 days:  No Current discharge risk:  Dependent with Mobility Barriers to Discharge:  No Barriers Identified   Lia Hopping, LCSW 01/28/2017, 8:30 PM

## 2017-01-29 DIAGNOSIS — J80 Acute respiratory distress syndrome: Secondary | ICD-10-CM | POA: Diagnosis not present

## 2017-01-29 DIAGNOSIS — R2689 Other abnormalities of gait and mobility: Secondary | ICD-10-CM | POA: Diagnosis not present

## 2017-01-29 DIAGNOSIS — R488 Other symbolic dysfunctions: Secondary | ICD-10-CM | POA: Diagnosis not present

## 2017-01-29 DIAGNOSIS — I509 Heart failure, unspecified: Secondary | ICD-10-CM | POA: Diagnosis not present

## 2017-01-29 DIAGNOSIS — R278 Other lack of coordination: Secondary | ICD-10-CM | POA: Diagnosis not present

## 2017-01-29 DIAGNOSIS — I480 Paroxysmal atrial fibrillation: Secondary | ICD-10-CM | POA: Diagnosis not present

## 2017-01-29 DIAGNOSIS — F419 Anxiety disorder, unspecified: Secondary | ICD-10-CM | POA: Diagnosis not present

## 2017-01-29 DIAGNOSIS — I5032 Chronic diastolic (congestive) heart failure: Secondary | ICD-10-CM | POA: Diagnosis not present

## 2017-01-29 DIAGNOSIS — I2510 Atherosclerotic heart disease of native coronary artery without angina pectoris: Secondary | ICD-10-CM | POA: Diagnosis not present

## 2017-01-29 DIAGNOSIS — I10 Essential (primary) hypertension: Secondary | ICD-10-CM

## 2017-01-29 DIAGNOSIS — M6281 Muscle weakness (generalized): Secondary | ICD-10-CM | POA: Diagnosis not present

## 2017-01-29 DIAGNOSIS — N183 Chronic kidney disease, stage 3 (moderate): Secondary | ICD-10-CM | POA: Diagnosis not present

## 2017-01-29 DIAGNOSIS — J189 Pneumonia, unspecified organism: Secondary | ICD-10-CM | POA: Diagnosis not present

## 2017-01-29 LAB — PROCALCITONIN

## 2017-01-29 MED ORDER — AMOXICILLIN-POT CLAVULANATE 500-125 MG PO TABS: 1.0000 | ORAL_TABLET | Freq: Two times a day (BID) | ORAL | 0 refills | Status: AC

## 2017-01-29 MED ORDER — ONDANSETRON 4 MG PO TBDP
4.0000 mg | ORAL_TABLET | Freq: Three times a day (TID) | ORAL | Status: DC | PRN
  Administered 2017-01-29: 4 mg via ORAL
  Filled 2017-01-29: qty 1

## 2017-01-29 MED ORDER — HYDROCODONE-ACETAMINOPHEN 5-325 MG PO TABS: 1.0000 | ORAL_TABLET | Freq: Four times a day (QID) | ORAL | 0 refills | Status: AC | PRN

## 2017-01-29 MED ORDER — ALPRAZOLAM 0.25 MG PO TABS: 0.2500 mg | ORAL_TABLET | Freq: Two times a day (BID) | ORAL | 0 refills | Status: AC | PRN

## 2017-01-29 MED ORDER — ONDANSETRON HCL 4 MG/2ML IJ SOLN
4.0000 mg | Freq: Four times a day (QID) | INTRAMUSCULAR | Status: DC | PRN
  Filled 2017-01-29: qty 2

## 2017-01-29 NOTE — Progress Notes (Signed)
Discharge instructions reviewed with patient and daughter. All questions answered. Awaiting PTAR transport to [facility]

## 2017-01-29 NOTE — Progress Notes (Addendum)
Received a message from nursing staff that patient's daughter was concerned about discharging patient today.  I called patient's daughter and discussed patient's condition and plan of care in detail.  Patient with possible basal pneumonia.  MRSA is negative.  Pro-calcitonin level low, doubt if it is bacterial infection.  Patient is clinically improved.  Patient has no fever, no leukocytosis and does not require oxygen.  She is on room air.  Denied chest pain, shortness of breath or coughing. Patient reported mild nausea but no vomiting or abdominal pain.  Tolerating diet well and had bowel movement yesterday.  Cultures has been negative so far. I believe patient is medically stable to transfer her care to a skilled nursing facility with oral antibiotics and continuing rehab and therapy.  I explained all this to patient's daughter.  She was upset that her mother is being discharged before 3 midnight stays as per Medicare rule.  I explained to her that there is no medical necessity for inpatient hospitalization at this time although she is 84 years old.  I explained to patient's daughter that patient will need oral medication and close follow-up along with supportive care.    The plan is to provide Zofran for nausea.  If she continue to tolerate diet well, I will change antibiotics to oral and transfer her care to outpatient.  Discussed with the Education officer, museum.  I also explained to patient's daughter that there is risk of infection which could be multiple drug-resistant and risk of developing delirium per being in the hospital longer.

## 2017-01-29 NOTE — Evaluation (Addendum)
Physical Therapy Evaluation Patient Details Name: Holly Weaver MRN: [id number] DOB: March 04, 1923 Today's Date: 01/29/2017   History of Present Illness  84 yr old admitted with Pneumonia from SNF where she was working with therapy , Post right tibial plateau fracture in 10/18, cleared for WBAT  By MD 01/07/17.  Clinical Impression  The patient is very motivated to mobilize and return home. Pt admitted with above diagnosis. Pt currently with functional limitations due to the deficits listed below (see PT Problem List).  Pt will benefit from skilled PT to increase their independence and safety with mobility to allow discharge to the venue listed below.       Follow Up Recommendations SNF    Equipment Recommendations  None recommended by PT    Recommendations for Other Services       Precautions / Restrictions Precautions Precautions: Fall   recent tib plat fracture on Right, now WBAT. Unsure about need for brace. Patient states trhat she has one.     Mobility  Bed Mobility Overal bed mobility: Needs Assistance Bed Mobility: Supine to Sit     Supine to sit: Min guard        Transfers Overall transfer level: Needs assistance Equipment used: Rolling walker (2 wheeled) Transfers: Sit to/from [facility] Sit to Stand: Min assist Stand pivot transfers: Min assist       General transfer comment: VC for hand placement  Ambulation/Gait Ambulation/Gait assistance: Min assist Ambulation Distance (Feet): 50 Feet Assistive device: Rolling walker (2 wheeled) Gait Pattern/deviations: Step-through pattern;Step-to pattern;Trunk flexed     General Gait Details: decreased speed, sats 100% on RA, HR 103  Stairs            Wheelchair Mobility    Modified Rankin (Stroke Patients Only)       Balance Overall balance assessment: Needs assistance;History of Falls Sitting-balance support: No upper extremity supported;Feet supported Sitting balance-Leahy  Scale: Good     Standing balance support: Bilateral upper extremity supported;During functional activity Standing balance-Leahy Scale: Poor Standing balance comment: relies on UEs                             Pertinent Vitals/Pain Pain Assessment: No/denies pain    Home Living Family/patient expects to be discharged to:: Skilled nursing facility                 Additional Comments: came from [facility]    Prior Function                 Hand Dominance        Extremity/Trunk Assessment   Upper Extremity Assessment Upper Extremity Assessment: Defer to OT evaluation    Lower Extremity Assessment Lower Extremity Assessment: Generalized weakness    Cervical / Trunk Assessment Cervical / Trunk Assessment: Kyphotic  Communication   Communication: No difficulties  Cognition Arousal/Alertness: Awake/alert Behavior During Therapy: WFL for tasks assessed/performed Overall Cognitive Status: Within Functional Limits for tasks assessed                                        General Comments      Exercises     Assessment/Plan    PT Assessment Patient needs continued PT services  PT Problem List Decreased strength;Decreased activity tolerance;Decreased balance;Decreased mobility;Decreased knowledge of use of DME  PT Treatment Interventions DME instruction;Gait training;Functional mobility training;Therapeutic activities;Therapeutic exercise;Patient/family education    PT Goals (Current goals can be found in the Care Plan section)  Acute Rehab PT Goals Patient Stated Goal: go back to rehab and get well PT Goal Formulation: With patient Time For Goal Achievement: 02/12/17 Potential to Achieve Goals: Good    Frequency Min 2X/week   Barriers to discharge        Co-evaluation PT/OT/SLP Co-Evaluation/Treatment: Yes Reason for Co-Treatment: For patient/therapist safety PT goals addressed during session: Mobility/safety  with mobility OT goals addressed during session: ADL's and self-care       AM-PAC PT "6 Clicks" Daily Activity  Outcome Measure Difficulty turning over in bed (including adjusting bedclothes, sheets and blankets)?: A Little Difficulty moving from lying on back to sitting on the side of the bed? : A Little Difficulty sitting down on and standing up from a chair with arms (e.g., wheelchair, bedside commode, etc,.)?: A Little Help needed moving to and from a bed to chair (including a wheelchair)?: A Lot Help needed walking in hospital room?: A Lot Help needed climbing 3-5 steps with a railing? : Total 6 Click Score: 14    End of Session Equipment Utilized During Treatment: Gait belt Activity Tolerance: Patient tolerated treatment well Patient left: in chair;with call bell/phone within reach;with chair alarm set Nurse Communication: Mobility status PT Visit Diagnosis: Difficulty in walking, not elsewhere classified (R26.2)    Time: 7290-2111 PT Time Calculation (min) (ACUTE ONLY): 22 min   Charges:   PT Evaluation $PT Eval Low Complexity: 1 Low     PT G CodesTresa Endo PT [phone]   Claretha Cooper 01/29/2017, 11:08 AM

## 2017-01-29 NOTE — Clinical Social Work Placement (Addendum)
Patient accepted to [facility]  5:03pm PTAR called for transport.   CLINICAL SOCIAL WORK PLACEMENT  NOTE  Date:  01/29/2017  Patient Details  Name: Holly Weaver MRN: [id number] Date of Birth: [birth year]  Clinical Social Work is seeking post-discharge placement for this patient at the [place] level of care (*CSW will initial, date and re-position this form in  chart as items are completed):      Patient/family provided with [place] Work Department's list of facilities offering this level of care within the geographic area requested by the patient (or if unable, by the patient's family).      Patient/family informed of their freedom to choose among providers that offer the needed level of care, that participate in Medicare, Medicaid or managed care program needed by the patient, have an available bed and are willing to accept the patient.      Patient/family informed of [facility]'s ownership interest in [facility] and [facility], as well as of the fact that they are under no obligation to receive care at these facilities.  PASRR submitted to EDS on       PASRR number received on       Existing PASRR number confirmed on 01/29/17     FL2 transmitted to all facilities in geographic area requested by pt/family on       FL2 transmitted to all facilities within larger geographic area on 01/29/17     Patient informed that his/her managed care company has contracts with or will negotiate with certain facilities, including the following:            Patient/family informed of bed offers received.  Patient chooses bed at [facility]     Physician recommends and patient chooses bed at      Patient to be transferred to [facility] on 01/29/17.  Patient to be transferred to facility by PTAR      Patient family notified on 01/29/17 of transfer.  Name of family member notified:  Duahgter at [facility].      PHYSICIAN Please sign FL2      Additional Comment:    _______________________________________________ Lia Hopping, LCSW 01/29/2017, 4:06 PM

## 2017-01-29 NOTE — Discharge Summary (Signed)
Physician Discharge Summary  MIA MILAN LTJ:[id number] DOB: Jul 12, 1923 DOA: 01/27/2017  PCP: [name], MD  Admit date: 01/27/2017 Discharge date: 01/29/2017  Admitted From:SNF Disposition: SNF  Recommendations for Outpatient Follow-up:  1. Follow up with PCP in 1-2 weeks 2. Please obtain BMP/CBC in one week  Home Health: SNF Equipment/Devices:none Discharge Condition:stable CODE STATUS:full code Diet recommendation:heart healthy  Brief/Interim Summary: 84 year old female with history of  atrial fibrillation on Eliquis, chronic diastolic CHF, breast cancer presented to the ER with worsening cough, shortness of breath despite treatment with azithromycin and nebulizer at a skilled nursing home.  In the ER CT noted to have bibasal opacity concerning for pneumonia.  #Bilateral lower lobe consolidation, presumed healthcare associated pneumonia on admission: Treated with broad-spectrum antibiotics.  Patient with no fever, no leukocytosis not hypoxic.  Denies shortness of breath or chest pain.  Pro-calcitonin level is low.  Doubt if it is bacterial pneumonia.  Treated with bronchodilators.  Patient feels good.  Discharging with 5 more days of oral Augmentin to complete the course.  Recommend outpatient chest x-ray follow-up.  #Chronic diastolic congestive heart failure: Euvolemic on exam.  Continue oral Lasix.  She does not have chest pain.  #Paroxysmal atrial fibrillation: Continue Eliquis, amiodarone.  Monitor heart rate.  #History of coronary artery disease: Patient has no chest pain.  #Chronic kidney disease stage III: Serum creatinine level stable.  Monitor BMP.  Avoid nephrotoxins.  #History of breast cancer recommended outpatient follow-up with oncology.  #Mild non-intractable nausea: Patient received Zofran with clinical improvement.  Tolerated diet well today.  Discussed with the nursing staff.  Abdomen exam benign.  Patient is clinically improved.  Discussed with her  nurse this morning.  She needs close follow-up and continue her medication.  Needs PT OT and support.  Recommend outpatient follow-up with PCP in a week.   Discharge Diagnoses:  Principal Problem:   HCAP (healthcare-associated pneumonia) Active Problems:   PAF (paroxysmal atrial fibrillation), maintaining SR   Hypertension   CAD in native artery, non obstructive by cath 2000   Breast cancer of upper-outer quadrant of left female breast (HCC)   Chronic diastolic CHF (congestive heart failure) (HCC)   CKD (chronic kidney disease), stage III [facility])    Discharge Instructions  Discharge Instructions    Call MD for:  difficulty breathing, headache or visual disturbances   Complete by:  As directed    Call MD for:  extreme fatigue   Complete by:  As directed    Call MD for:  hives   Complete by:  As directed    Call MD for:  persistant dizziness or light-headedness   Complete by:  As directed    Call MD for:  persistant nausea and vomiting   Complete by:  As directed    Call MD for:  severe uncontrolled pain   Complete by:  As directed    Call MD for:  temperature >100.4   Complete by:  As directed    Diet - low sodium heart healthy   Complete by:  As directed    Increase activity slowly   Complete by:  As directed      Allergies as of 01/29/2017      Reactions   Codeine Nausea And Vomiting      Medication List    STOP taking these medications   azithromycin 250 MG tablet Commonly known as:  ZITHROMAX     TAKE these medications   ALPRAZolam 0.25 MG tablet Commonly known  as:  XANAX Take 1 tablet (0.25 mg total) by mouth 2 (two) times daily as needed for anxiety. Take one tablet by mouth every 12 hours as needed for severe anxiety   amiodarone 200 MG tablet Commonly known as:  PACERONE Take 200 mg by mouth every morning.   amoxicillin-clavulanate 500-125 MG tablet Commonly known as:  AUGMENTIN Take 1 tablet (500 mg total) by mouth 2 (two) times daily for 5 days.    atorvastatin 40 MG tablet Commonly known as:  LIPITOR Take 40 mg daily by mouth.   benzonatate 100 MG capsule Commonly known as:  TESSALON Take 100 mg by mouth 4 (four) times daily as needed for cough.   BREO ELLIPTA 200-25 MCG/INH Aepb Generic drug:  fluticasone furoate-vilanterol Inhale 1 puff into the lungs daily.   calcium-vitamin D 500-200 MG-UNIT tablet Commonly known as:  OSCAL WITH D Take 1 tablet by mouth every morning.   denosumab 60 MG/ML Soln injection Commonly known as:  PROLIA Inject 60 mg into the skin every 6 (six) months. Administer in upper arm, thigh, or abdomen   ELIQUIS 2.5 MG Tabs tablet Generic drug:  apixaban Take 2.5 mg by mouth 2 (two) times daily.   feeding supplement (ENSURE ENLIVE) Liqd Take 237 mLs by mouth 2 (two) times daily between meals.   furosemide 40 MG tablet Commonly known as:  LASIX Take 1 tablet (40 mg total) by mouth daily.   HYDROcodone-acetaminophen 5-325 MG tablet Commonly known as:  NORCO/VICODIN Take 1 tablet by mouth every 6 (six) hours as needed for severe pain. What changed:    when to take this  reasons to take this   letrozole 2.5 MG tablet Commonly known as:  FEMARA Take 1 tablet (2.5 mg total) by mouth daily.   levalbuterol 0.63 MG/3ML nebulizer solution Commonly known as:  XOPENEX Take 0.63 mg by nebulization every 8 (eight) hours as needed for wheezing.   lidocaine 5 % Commonly known as:  LIDODERM Place 1 patch onto the skin. UNWRAP AND APPLY 1 PATCH TO SKIN DAILY FOR 12 HOURS AND 12 HOURS OFF   multivitamin with minerals Tabs tablet Take 1 tablet by mouth every morning. Centrum 50+   olmesartan 20 MG tablet Commonly known as:  BENICAR Take 20 mg by mouth daily.   ondansetron 4 MG tablet Commonly known as:  ZOFRAN Take 4 mg by mouth every 8 (eight) hours as needed for nausea or vomiting.   polyethylene glycol packet Commonly known as:  MIRALAX / GLYCOLAX Take 17 g by mouth daily as needed for  mild constipation or moderate constipation.   potassium chloride SA 20 MEQ tablet Commonly known as:  K-DUR,KLOR-CON Take 20 mEq by mouth daily.   PRESCRIPTION MEDICATION Take 120 mLs by mouth 2 (two) times daily. MeddPass 2.0 Nutritional Supplement between meals   senna 8.6 MG tablet Commonly known as:  SENOKOT Take 1 tablet by mouth daily.   traMADol 50 MG tablet Commonly known as:  ULTRAM Take 1 tablet (50 mg total) every 8 (eight) hours as needed by mouth for moderate pain or severe pain.   Vitamin D (Ergocalciferol) 50000 units Caps capsule Commonly known as:  DRISDOL Take 50,000 Units by mouth every 7 (seven) days.      Follow-up Information    [name], MD. Schedule an appointment as soon as possible for a visit in 1 week(s).   Specialty:  Internal Medicine Contact information: [address] [place] [address] [phone]  Allergies  Allergen Reactions  . Codeine Nausea And Vomiting    Consultations: None  Procedures/Studies: None  Subjective: Seen and examined at bedside.  Reported mild nausea in the morning.  Denied headache, dizziness, chest pain, shortness of breath.  Has chronic underlying weakness.  Discharge Exam: Vitals:   01/29/17 1016 01/29/17 1331  BP:  102/66  Pulse: 97 99  Resp: 16 18  Temp:  98.6 F (37 C)  SpO2: 96% 99%   Vitals:   01/28/17 2134 01/29/17 0532 01/29/17 1016 01/29/17 1331  BP: 123/65 (!) 149/63  102/66  Pulse: 71 79 97 99  Resp: 18 18 16 18   Temp: 99.2 F (37.3 C) 98.9 F (37.2 C)  98.6 F (37 C)  TempSrc: Oral Oral  Oral  SpO2: 98% 97% 96% 99%  Weight:  45.3 kg (99 lb 13.9 oz)    Height:        General: Pt is alert, awake, not in acute distress Cardiovascular: RRR, S1/S2 +, no rubs, no gallops Respiratory: CTA bilaterally, no wheezing, no rhonchi Abdominal: Soft, NT, ND, bowel sounds + Extremities: no edema, no cyanosis    The results of significant diagnostics from this  hospitalization (including imaging, microbiology, ancillary and laboratory) are listed below for reference.     Microbiology: Recent Results (from the past 240 hour(s))  MRSA PCR Screening     Status: None   Collection Time: 01/27/17  7:47 PM  Result Value Ref Range Status   MRSA by PCR NEGATIVE NEGATIVE Final    Comment:        The GeneXpert MRSA Assay (FDA approved for NASAL specimens only), is one component of a comprehensive MRSA colonization surveillance program. It is not intended to diagnose MRSA infection nor to guide or monitor treatment for MRSA infections.   Blood culture (routine x 2)     Status: None (Preliminary result)   Collection Time: 01/27/17  7:55 PM  Result Value Ref Range Status   Specimen Description BLOOD LEFT FOREARM  Final   Special Requests   Final    BOTTLES DRAWN AEROBIC AND ANAEROBIC Blood Culture adequate volume   Culture   Final    NO GROWTH 1 DAY Performed at [facility], [place] [address] [place], [place] [address]    Report Status PENDING  Incomplete  Blood culture (routine x 2)     Status: None (Preliminary result)   Collection Time: 01/27/17  8:02 PM  Result Value Ref Range Status   Specimen Description BLOOD LEFT HAND  Final   Special Requests IN PEDIATRIC BOTTLE Blood Culture adequate volume  Final   Culture   Final    NO GROWTH 1 DAY Performed at [facility], [place] [address], [place], [place] [address]    Report Status PENDING  Incomplete  Respiratory Panel by PCR     Status: None   Collection Time: 01/28/17  8:24 AM  Result Value Ref Range Status   Adenovirus NOT DETECTED NOT DETECTED Final   Coronavirus 229E NOT DETECTED NOT DETECTED Final   Coronavirus HKU1 NOT DETECTED NOT DETECTED Final   Coronavirus NL63 NOT DETECTED NOT DETECTED Final   Coronavirus OC43 NOT DETECTED NOT DETECTED Final   Metapneumovirus NOT DETECTED NOT DETECTED Final   Rhinovirus / Enterovirus NOT DETECTED NOT DETECTED Final   Influenza A NOT  DETECTED NOT DETECTED Final   Influenza B NOT DETECTED NOT DETECTED Final   Parainfluenza Virus 1 NOT DETECTED NOT DETECTED Final  Parainfluenza Virus 2 NOT DETECTED NOT DETECTED Final   Parainfluenza Virus 3 NOT DETECTED NOT DETECTED Final   Parainfluenza Virus 4 NOT DETECTED NOT DETECTED Final   Respiratory Syncytial Virus NOT DETECTED NOT DETECTED Final   Bordetella pertussis NOT DETECTED NOT DETECTED Final   Chlamydophila pneumoniae NOT DETECTED NOT DETECTED Final   Mycoplasma pneumoniae NOT DETECTED NOT DETECTED Final    Comment: Performed at [facility], [place] [address], [place], [place] [address]     Labs: BNP (last 3 results) Recent Labs    01/27/17 1655  BNP 676.7*   Basic Metabolic Panel: Recent Labs  Lab 01/27/17 1655 01/27/17 1705 01/28/17 0523  NA 135 137 136  K 4.6 5.2* 4.1  CL 100* 97* 102  CO2 28  --  27  GLUCOSE 133* 128* 93  BUN 44* 60* 37*  CREATININE 1.35* 1.40* 1.21*  CALCIUM 8.7*  --  8.6*   Liver Function Tests: Recent Labs  Lab 01/27/17 1655  AST 54*  ALT 33  ALKPHOS 67  BILITOT 0.9  PROT 6.8  ALBUMIN 3.5   No results for input(s): LIPASE, AMYLASE in the last 168 hours. No results for input(s): AMMONIA in the last 168 hours. CBC: Recent Labs  Lab 01/27/17 1655 01/27/17 1705 01/28/17 0523  WBC 7.1  --  5.5  NEUTROABS 5.8  --  4.1  HGB 12.2 12.2 10.9*  HCT 37.0 36.0 32.7*  MCV 102.8*  --  103.2*  PLT 236  --  216   Cardiac Enzymes: No results for input(s): CKTOTAL, CKMB, CKMBINDEX, TROPONINI in the last 168 hours. BNP: Invalid input(s): POCBNP CBG: No results for input(s): GLUCAP in the last 168 hours. D-Dimer No results for input(s): DDIMER in the last 72 hours. Hgb A1c No results for input(s): HGBA1C in the last 72 hours. Lipid Profile No results for input(s): CHOL, HDL, LDLCALC, TRIG, CHOLHDL, LDLDIRECT in the last 72 hours. Thyroid function studies No results for input(s): TSH, T4TOTAL, T3FREE, THYROIDAB in the  last 72 hours.  Invalid input(s): FREET3 Anemia work up No results for input(s): VITAMINB12, FOLATE, FERRITIN, TIBC, IRON, RETICCTPCT in the last 72 hours. Urinalysis    Component Value Date/Time   COLORURINE YELLOW 11/28/2016 2045   APPEARANCEUR HAZY (A) 11/28/2016 2045   LABSPEC 1.011 11/28/2016 2045   PHURINE 7.0 11/28/2016 2045   GLUCOSEU NEGATIVE 11/28/2016 2045   HGBUR SMALL (A) 11/28/2016 2045   BILIRUBINUR NEGATIVE 11/28/2016 2045   KETONESUR NEGATIVE 11/28/2016 2045   PROTEINUR NEGATIVE 11/28/2016 2045   UROBILINOGEN 1.0 09/11/2014 0111   NITRITE NEGATIVE 11/28/2016 2045   LEUKOCYTESUR LARGE (A) 11/28/2016 2045   Sepsis Labs Invalid input(s): PROCALCITONIN,  WBC,  LACTICIDVEN Microbiology Recent Results (from the past 240 hour(s))  MRSA PCR Screening     Status: None   Collection Time: 01/27/17  7:47 PM  Result Value Ref Range Status   MRSA by PCR NEGATIVE NEGATIVE Final    Comment:        The GeneXpert MRSA Assay (FDA approved for NASAL specimens only), is one component of a comprehensive MRSA colonization surveillance program. It is not intended to diagnose MRSA infection nor to guide or monitor treatment for MRSA infections.   Blood culture (routine x 2)     Status: None (Preliminary result)   Collection Time: 01/27/17  7:55 PM  Result Value Ref Range Status   Specimen Description BLOOD LEFT FOREARM  Final   Special Requests   Final  BOTTLES DRAWN AEROBIC AND ANAEROBIC Blood Culture adequate volume   Culture   Final    NO GROWTH 1 DAY Performed at [facility], [place] [address], [place], [place] [address]    Report Status PENDING  Incomplete  Blood culture (routine x 2)     Status: None (Preliminary result)   Collection Time: 01/27/17  8:02 PM  Result Value Ref Range Status   Specimen Description BLOOD LEFT HAND  Final   Special Requests IN PEDIATRIC BOTTLE Blood Culture adequate volume  Final   Culture   Final    NO GROWTH 1 DAY Performed  at [facility], [place] [address], [place], [place] [address]    Report Status PENDING  Incomplete  Respiratory Panel by PCR     Status: None   Collection Time: 01/28/17  8:24 AM  Result Value Ref Range Status   Adenovirus NOT DETECTED NOT DETECTED Final   Coronavirus 229E NOT DETECTED NOT DETECTED Final   Coronavirus HKU1 NOT DETECTED NOT DETECTED Final   Coronavirus NL63 NOT DETECTED NOT DETECTED Final   Coronavirus OC43 NOT DETECTED NOT DETECTED Final   Metapneumovirus NOT DETECTED NOT DETECTED Final   Rhinovirus / Enterovirus NOT DETECTED NOT DETECTED Final   Influenza A NOT DETECTED NOT DETECTED Final   Influenza B NOT DETECTED NOT DETECTED Final   Parainfluenza Virus 1 NOT DETECTED NOT DETECTED Final   Parainfluenza Virus 2 NOT DETECTED NOT DETECTED Final   Parainfluenza Virus 3 NOT DETECTED NOT DETECTED Final   Parainfluenza Virus 4 NOT DETECTED NOT DETECTED Final   Respiratory Syncytial Virus NOT DETECTED NOT DETECTED Final   Bordetella pertussis NOT DETECTED NOT DETECTED Final   Chlamydophila pneumoniae NOT DETECTED NOT DETECTED Final   Mycoplasma pneumoniae NOT DETECTED NOT DETECTED Final    Comment: Performed at [facility], [place] [address], [place],  [address]     Time coordinating discharge: 32 minutes  SIGNED:   Rosita Fire, MD  [facility] 01/29/2017, 2:15 PM  If 7PM-7AM, please contact night-coverage [email or web address] Password [email or web address]

## 2017-01-29 NOTE — NC FL2 (Addendum)
[place] LEVEL OF CARE SCREENING TOOL     IDENTIFICATION  Patient Name: Holly Weaver Birthdate: 1923-02-19 Sex: female Admission Date (Current Location): 01/27/2017  [facility] and [place] Number:  Herbalist and Address:  [facility],  [place] [address] [place]      Provider Number: [id number]  Attending Physician Name and Address:  Rosita Fire, MD  Relative Name and Phone Number:       Current Level of Care: Hospital Recommended Level of Care: [place] Prior Approval Number:    Date Approved/Denied:   PASRR Number:   [id number]  Discharge Plan: SNF    Current Diagnoses: Patient Active Problem List   Diagnosis Date Noted  . COPD with acute exacerbation ([place]) 01/27/2017  . HCAP (healthcare-associated pneumonia) 01/27/2017  . Chronic diastolic CHF (congestive heart failure) ([place]) 01/27/2017  . CKD (chronic kidney disease), stage III ([place]) 01/27/2017  . Acute lower UTI 11/26/2016  . Falls 11/26/2016  . Cellulitis 11/26/2016  . Osteopenia 12/30/2015  . Atelectasis 08/30/2015  . Dyspnea on exertion 04/04/2015  . Chronic pulmonary embolism ([place]) 04/04/2015  . Acute respiratory failure with hypoxia ([place]) 09/14/2014  . Lobar pneumonia due to unspecified organism 09/14/2014  . Protein-calorie malnutrition, severe ([place]) 09/14/2014  . Pressure ulcer 09/11/2014  . Breast cancer of upper-outer quadrant of left female breast ([place]) 05/26/2013  . CAD in native artery, non obstructive by cath 2000 09/23/2012  . Dyslipidemia 09/21/2012  . Hypertension 09/21/2012  . PAF (paroxysmal atrial fibrillation), maintaining SR 05/10/2012  . Long term current use of anticoagulant therapy 05/10/2012    Orientation RESPIRATION BLADDER Height & Weight     Self, Place, Situation  Normal Continent Weight: 99 lb 13.9 oz (45.3 kg) Height:  5\' 4"  (162.6 cm)  BEHAVIORAL SYMPTOMS/MOOD NEUROLOGICAL BOWEL NUTRITION  STATUS      Continent Diet(Low Sodium Heart Healthy)  AMBULATORY STATUS COMMUNICATION OF NEEDS Skin   Limited Assist Verbally Normal                       Personal Care Assistance Level of Assistance  Bathing, Feeding, Dressing Bathing Assistance: Limited assistance Feeding assistance: Independent Dressing Assistance: Limited assistance     Functional Limitations Info  Sight, Hearing, Speech Sight Info: Adequate Hearing Info: Impaired Speech Info: Adequate    SPECIAL CARE FACTORS FREQUENCY  PT (By licensed PT), OT (By licensed OT)     PT Frequency: 5X/WEEK OT Frequency: 5X/WEEK            Contractures Contractures Info: Not present    Additional Factors Info  Code Status, Allergies, Psychotropic Code Status Info: FULLCODE Allergies Info: Allergies: Codeine           Current Medications (01/29/2017):  This is the current hospital active medication list Current Facility-Administered Medications  Medication Dose Route Frequency Provider Last Rate Last Dose  . albuterol (PROVENTIL) (2.5 MG/3ML) 0.083% nebulizer solution 2.5 mg  2.5 mg Nebulization Q4H PRN Opyd, Ilene Qua, MD      . ALPRAZolam Duanne Moron) tablet 0.25 mg  0.25 mg Oral BID PRN Opyd, Ilene Qua, MD      . amiodarone (PACERONE) tablet 200 mg  200 mg Oral q morning - 10a Opyd, Ilene Qua, MD   200 mg at 01/29/17 1421  . apixaban (ELIQUIS) tablet 2.5 mg  2.5 mg Oral BID Opyd, Ilene Qua, MD   2.5 mg at 01/29/17 1421  . atorvastatin (LIPITOR)  tablet 40 mg  40 mg Oral q1800 Opyd, Ilene Qua, MD   40 mg at 01/28/17 1740  . calcium-vitamin D (OSCAL WITH D) 500-200 MG-UNIT per tablet 1 tablet  1 tablet Oral q morning - 10a Opyd, Ilene Qua, MD   1 tablet at 01/29/17 1420  . ceFEPIme (MAXIPIME) 500 mg in dextrose 5 % 50 mL IVPB  500 mg Intravenous Q24H Emiliano Dyer, RPH   500 mg at 01/28/17 2141  . feeding supplement (ENSURE ENLIVE) (ENSURE ENLIVE) liquid 237 mL  237 mL Oral BID BM Opyd, Timothy S, MD      .  fluticasone furoate-vilanterol (BREO ELLIPTA) 200-25 MCG/INH 1 puff  1 puff Inhalation Daily Opyd, Ilene Qua, MD   1 puff at 01/29/17 1016  . furosemide (LASIX) tablet 40 mg  40 mg Oral Daily Opyd, Ilene Qua, MD   40 mg at 01/29/17 1422  . HYDROcodone-acetaminophen (NORCO/VICODIN) 5-325 MG per tablet 1-2 tablet  1-2 tablet Oral Q6H PRN Opyd, Ilene Qua, MD   1 tablet at 01/28/17 2137  . irbesartan (AVAPRO) tablet 150 mg  150 mg Oral Daily Opyd, Ilene Qua, MD   150 mg at 01/29/17 1422  . letrozole [facility]) tablet 2.5 mg  2.5 mg Oral Daily Opyd, Ilene Qua, MD   2.5 mg at 01/29/17 1424  . lidocaine (LIDODERM) 5 % 1 patch  1 patch Transdermal Q24H Opyd, Ilene Qua, MD   1 patch at 01/28/17 2138  . multivitamin with minerals tablet 1 tablet  1 tablet Oral q morning - 10a Opyd, Ilene Qua, MD   1 tablet at 01/29/17 1422  . ondansetron (ZOFRAN-ODT) disintegrating tablet 4 mg  4 mg Oral Q8H PRN Rosita Fire, MD   4 mg at 01/29/17 1107  . polyethylene glycol (MIRALAX / GLYCOLAX) packet 17 g  17 g Oral Daily PRN Opyd, Ilene Qua, MD      . potassium chloride SA (K-DUR,KLOR-CON) CR tablet 20 mEq  20 mEq Oral Daily Opyd, Ilene Qua, MD   20 mEq at 01/29/17 1421     Discharge Medications: Please see discharge summary for a list of discharge medications.  Relevant Imaging Results:  Relevant Lab Results:   Additional Information SSN [id number]  Lia Hopping, LCSW

## 2017-01-29 NOTE — Progress Notes (Signed)
Report called and given to RN at [facility]. All questions answered.

## 2017-01-29 NOTE — Progress Notes (Signed)
Patient is discharged to [facility] via [place].  [place] signature sheet signed and handed to the travel nurse; iIncluding the discharge package in the white envelope given.Patient's vital signs are within normal range. Patient was alert and oriented at the time of discharge.

## 2017-01-29 NOTE — Evaluation (Signed)
Occupational Therapy Evaluation Patient Details Name: MUSKAN BOLLA MRN: [id number] DOB: [birth year] Today's Date: 01/29/2017    History of Present Illness 84 yr old admitted with Pneumonia from SNF where she was working with therapy    Clinical Impression   Pt admitted with Pneumonia. Pt currently with functional limitations due to the deficits listed below (see OT Problem List).  Pt will benefit from skilled OT to increase their safety and independence with ADL and functional mobility for ADL to facilitate discharge to venue listed below.      Follow Up Recommendations  SNF    Equipment Recommendations  None recommended by OT    Recommendations for Other Services       Precautions / Restrictions Precautions Precautions: Fall      Mobility Bed Mobility Overal bed mobility: Needs Assistance Bed Mobility: Supine to Sit     Supine to sit: Min guard        Transfers Overall transfer level: Needs assistance Equipment used: Rolling walker (2 wheeled) Transfers: Sit to/from [facility] Sit to Stand: Min assist Stand pivot transfers: Min assist       General transfer comment: VC for hand placement        ADL either performed or assessed with clinical judgement   ADL Overall ADL's : Needs assistance/impaired Eating/Feeding: Set up;Sitting   Grooming: Set up;Sitting   Upper Body Bathing: Set up;Sitting   Lower Body Bathing: Minimal assistance;Sit to/from stand;Cueing for safety   Upper Body Dressing : Set up;Sitting   Lower Body Dressing: Minimal assistance;Sit to/from stand;Cueing for safety;Cueing for sequencing   Toilet Transfer: Minimal assistance;RW;Comfort height toilet   Toileting- Clothing Manipulation and Hygiene: Minimal assistance;Sit to/from stand               Vision Patient Visual Report: No change from baseline              Pertinent Vitals/Pain Pain Assessment: No/denies pain     Hand Dominance      Extremity/Trunk Assessment Upper Extremity Assessment Upper Extremity Assessment: Generalized weakness           Communication     Cognition Arousal/Alertness: Awake/alert Behavior During Therapy: WFL for tasks assessed/performed Overall Cognitive Status: Within Functional Limits for tasks assessed                                                Home Living Family/patient expects to be discharged to:: Skilled nursing facility                                                 OT Problem List: Decreased strength;Impaired balance (sitting and/or standing);Decreased safety awareness;Decreased knowledge of use of DME or AE      OT Treatment/Interventions: Self-care/ADL training;Patient/family education;DME and/or AE instruction;Therapeutic activities    OT Goals(Current goals can be found in the care plan section) Acute Rehab OT Goals Patient Stated Goal: go back to rehab and get well OT Goal Formulation: With patient Time For Goal Achievement: 02/12/17 Potential to Achieve Goals: Good  OT Frequency: Min 2X/week   Barriers to D/C: Decreased caregiver support          Co-evaluation PT/OT/SLP Co-Evaluation/Treatment: Yes   PT goals  addressed during session: Mobility/safety with mobility OT goals addressed during session: ADL's and self-care      AM-PAC PT "6 Clicks" Daily Activity     Outcome Measure Help from another person eating meals?: None Help from another person taking care of personal grooming?: A Little Help from another person toileting, which includes using toliet, bedpan, or urinal?: A Little Help from another person bathing (including washing, rinsing, drying)?: A Little Help from another person to put on and taking off regular upper body clothing?: A Little Help from another person to put on and taking off regular lower body clothing?: A Little 6 Click Score: 19   End of Session Equipment Utilized During Treatment:  Rolling walker;Gait belt Nurse Communication: Mobility status  Activity Tolerance: Patient tolerated treatment well Patient left: in chair;with call bell/phone within reach;with chair alarm set  OT Visit Diagnosis: Unsteadiness on feet (R26.81);History of falling (Z91.81)                Time: 1505-6979 OT Time Calculation (min): 21 min Charges:  OT Evaluation $OT Eval Moderate Complexity: 1 Mod G-Codes:    Kari Baars, OT [phone]  Payton Mccallum D 01/29/2017, 11:03 AM

## 2017-01-30 DIAGNOSIS — J189 Pneumonia, unspecified organism: Secondary | ICD-10-CM | POA: Diagnosis not present

## 2017-01-30 DIAGNOSIS — I480 Paroxysmal atrial fibrillation: Secondary | ICD-10-CM | POA: Diagnosis not present

## 2017-01-30 DIAGNOSIS — F419 Anxiety disorder, unspecified: Secondary | ICD-10-CM | POA: Diagnosis not present

## 2017-01-30 DIAGNOSIS — I10 Essential (primary) hypertension: Secondary | ICD-10-CM | POA: Diagnosis not present

## 2017-02-01 DIAGNOSIS — J189 Pneumonia, unspecified organism: Secondary | ICD-10-CM | POA: Diagnosis not present

## 2017-02-01 DIAGNOSIS — F419 Anxiety disorder, unspecified: Secondary | ICD-10-CM | POA: Diagnosis not present

## 2017-02-01 DIAGNOSIS — I10 Essential (primary) hypertension: Secondary | ICD-10-CM | POA: Diagnosis not present

## 2017-02-01 DIAGNOSIS — I480 Paroxysmal atrial fibrillation: Secondary | ICD-10-CM | POA: Diagnosis not present

## 2017-02-02 LAB — CULTURE, BLOOD (ROUTINE X 2)
CULTURE: NO GROWTH
Culture: NO GROWTH
Special Requests: ADEQUATE
Special Requests: ADEQUATE

## 2017-02-04 ENCOUNTER — Ambulatory Visit (INDEPENDENT_AMBULATORY_CARE_PROVIDER_SITE_OTHER): Payer: Medicare Other | Admitting: Orthopaedic Surgery

## 2017-02-06 DIAGNOSIS — F419 Anxiety disorder, unspecified: Secondary | ICD-10-CM | POA: Diagnosis not present

## 2017-02-06 DIAGNOSIS — I509 Heart failure, unspecified: Secondary | ICD-10-CM | POA: Diagnosis not present

## 2017-02-06 DIAGNOSIS — I480 Paroxysmal atrial fibrillation: Secondary | ICD-10-CM | POA: Diagnosis not present

## 2017-03-28 DIAGNOSIS — M810 Age-related osteoporosis without current pathological fracture: Secondary | ICD-10-CM | POA: Diagnosis not present

## 2017-03-28 DIAGNOSIS — E7849 Other hyperlipidemia: Secondary | ICD-10-CM | POA: Diagnosis not present

## 2017-03-28 DIAGNOSIS — S82009A Unspecified fracture of unspecified patella, initial encounter for closed fracture: Secondary | ICD-10-CM | POA: Diagnosis not present

## 2017-03-28 DIAGNOSIS — Z1389 Encounter for screening for other disorder: Secondary | ICD-10-CM | POA: Diagnosis not present

## 2017-03-28 DIAGNOSIS — Z7189 Other specified counseling: Secondary | ICD-10-CM | POA: Diagnosis not present

## 2017-03-28 DIAGNOSIS — C50912 Malignant neoplasm of unspecified site of left female breast: Secondary | ICD-10-CM | POA: Diagnosis not present

## 2017-03-28 DIAGNOSIS — I480 Paroxysmal atrial fibrillation: Secondary | ICD-10-CM | POA: Diagnosis not present

## 2017-03-28 DIAGNOSIS — N184 Chronic kidney disease, stage 4 (severe): Secondary | ICD-10-CM | POA: Diagnosis not present

## 2017-03-28 DIAGNOSIS — I2510 Atherosclerotic heart disease of native coronary artery without angina pectoris: Secondary | ICD-10-CM | POA: Diagnosis not present

## 2017-03-28 DIAGNOSIS — Z681 Body mass index (BMI) 19 or less, adult: Secondary | ICD-10-CM | POA: Diagnosis not present

## 2017-03-28 DIAGNOSIS — Z7901 Long term (current) use of anticoagulants: Secondary | ICD-10-CM | POA: Diagnosis not present

## 2017-03-28 DIAGNOSIS — I10 Essential (primary) hypertension: Secondary | ICD-10-CM | POA: Diagnosis not present

## 2017-04-22 ENCOUNTER — Other Ambulatory Visit: Payer: Self-pay

## 2017-04-22 DIAGNOSIS — C50412 Malignant neoplasm of upper-outer quadrant of left female breast: Secondary | ICD-10-CM

## 2017-04-22 MED ORDER — LETROZOLE 2.5 MG PO TABS: 2.5000 mg | ORAL_TABLET | Freq: Every day | ORAL | 4 refills | Status: DC

## 2017-04-22 NOTE — Telephone Encounter (Signed)
Received VM from pt's daughter regarding needing refill of Letrozole.  Returned her call, she said [facility] and the does not have any more of Letrozole left.  Notified Wilber Bihari NP- refill per her completed. No other needs per pt's daughter at this time.

## 2017-05-02 ENCOUNTER — Ambulatory Visit (HOSPITAL_COMMUNITY): Payer: Medicare Other

## 2017-05-07 ENCOUNTER — Emergency Department (HOSPITAL_COMMUNITY)
Admission: EM | Admit: 2017-05-07 | Discharge: 2017-05-08 | Disposition: A | Discharge status: Home / Self Care | Payer: Medicare Other | Attending: Emergency Medicine | Admitting: Emergency Medicine

## 2017-05-07 ENCOUNTER — Encounter (HOSPITAL_COMMUNITY): Payer: Self-pay

## 2017-05-07 ENCOUNTER — Emergency Department (HOSPITAL_COMMUNITY): Payer: Medicare Other

## 2017-05-07 ENCOUNTER — Other Ambulatory Visit: Payer: Self-pay

## 2017-05-07 DIAGNOSIS — I2510 Atherosclerotic heart disease of native coronary artery without angina pectoris: Secondary | ICD-10-CM | POA: Diagnosis not present

## 2017-05-07 DIAGNOSIS — I4891 Unspecified atrial fibrillation: Secondary | ICD-10-CM | POA: Diagnosis not present

## 2017-05-07 DIAGNOSIS — M25462 Effusion, left knee: Secondary | ICD-10-CM | POA: Insufficient documentation

## 2017-05-07 DIAGNOSIS — M1712 Unilateral primary osteoarthritis, left knee: Secondary | ICD-10-CM | POA: Insufficient documentation

## 2017-05-07 DIAGNOSIS — M25562 Pain in left knee: Secondary | ICD-10-CM | POA: Diagnosis not present

## 2017-05-07 DIAGNOSIS — M25569 Pain in unspecified knee: Secondary | ICD-10-CM | POA: Diagnosis not present

## 2017-05-07 DIAGNOSIS — Z79899 Other long term (current) drug therapy: Secondary | ICD-10-CM | POA: Diagnosis not present

## 2017-05-07 DIAGNOSIS — I130 Hypertensive heart and chronic kidney disease with heart failure and stage 1 through stage 4 chronic kidney disease, or unspecified chronic kidney disease: Secondary | ICD-10-CM | POA: Diagnosis not present

## 2017-05-07 DIAGNOSIS — I5032 Chronic diastolic (congestive) heart failure: Secondary | ICD-10-CM | POA: Insufficient documentation

## 2017-05-07 DIAGNOSIS — N183 Chronic kidney disease, stage 3 (moderate): Secondary | ICD-10-CM | POA: Insufficient documentation

## 2017-05-07 DIAGNOSIS — J449 Chronic obstructive pulmonary disease, unspecified: Secondary | ICD-10-CM | POA: Diagnosis not present

## 2017-05-07 DIAGNOSIS — Z853 Personal history of malignant neoplasm of breast: Secondary | ICD-10-CM | POA: Insufficient documentation

## 2017-05-07 DIAGNOSIS — Z7901 Long term (current) use of anticoagulants: Secondary | ICD-10-CM | POA: Insufficient documentation

## 2017-05-07 DIAGNOSIS — M1990 Unspecified osteoarthritis, unspecified site: Secondary | ICD-10-CM | POA: Diagnosis not present

## 2017-05-07 MED ORDER — SODIUM CHLORIDE 0.9 % IV BOLUS
1000.0000 mL | Freq: Once | INTRAVENOUS | Status: AC
  Administered 2017-05-07: 500 mL via INTRAVENOUS

## 2017-05-07 MED ORDER — LIDOCAINE-EPINEPHRINE (PF) 2 %-1:200000 IJ SOLN
10.0000 mL | Freq: Once | INTRAMUSCULAR | Status: DC
  Filled 2017-05-07: qty 20

## 2017-05-07 MED ORDER — ONDANSETRON HCL 4 MG/2ML IJ SOLN
4.0000 mg | Freq: Once | INTRAMUSCULAR | Status: AC
  Administered 2017-05-07: 4 mg via INTRAVENOUS
  Filled 2017-05-07: qty 2

## 2017-05-07 MED ORDER — FENTANYL CITRATE (PF) 100 MCG/2ML IJ SOLN
25.0000 ug | Freq: Once | INTRAMUSCULAR | Status: AC
  Administered 2017-05-07: 25 ug via INTRAVENOUS
  Filled 2017-05-07: qty 2

## 2017-05-07 NOTE — ED Provider Notes (Signed)
[facility] DEPT Provider Note   CSN: [id number] Arrival date & time: 05/07/17  2019     History   Chief Complaint Chief Complaint  Patient presents with  . Knee Pain    HPI Holly Weaver is a 84 y.o. female.  HPI   Woke up today with left knee pain, no specific injuries. Had similar presentation in November with right knee pain and found to have a fracture at that time without specific injury.  Had also previously fractured a foot by walking. Daughter gave her norco, then she had nausea, low appetite.  Pain level right now 5/10, is increased with attempting to weightbear or bending knees.  Was 8/10 before.  In November let previous right sided fx heal on its own.  Was using walker, can't bend, straighten or bear weight.  No fevers.  Taking eliquis still.   Past Medical History:  Diagnosis Date  . Arthritis    "hands and legs" (10/09/2012)  . Atrial fibrillation ([place])   . Breast cancer ([place]) ~ 2006   "left" (10/09/2012)  . Cervical spondylolysis 09/21/2012  . Chronic lower back pain   . Compression fracture 09/21/2012  . COPD (chronic obstructive pulmonary disease) ([place])   . Dyslipidemia 09/21/2012  . Heart murmur   . History of blood transfusion    "years and years ago; I was anemic" (10/09/2012)  . Hypertension   . MVP (mitral valve prolapse) 09/23/2012  . Osteoarthritis 09/21/2012  . Osteoporosis   . Scoliosis   . Shortness of breath    "just now; nose is dry" (10/09/2012)    Patient Active Problem List   Diagnosis Date Noted  . COPD with acute exacerbation ([place]) 01/27/2017  . HCAP (healthcare-associated pneumonia) 01/27/2017  . Chronic diastolic CHF (congestive heart failure) ([place]) 01/27/2017  . CKD (chronic kidney disease), stage III ([place]) 01/27/2017  . Acute lower UTI 11/26/2016  . Falls 11/26/2016  . Cellulitis 11/26/2016  . Osteopenia 12/30/2015  . Atelectasis 08/30/2015  . Dyspnea on exertion 04/04/2015  . Chronic  pulmonary embolism ([place]) 04/04/2015  . Acute respiratory failure with hypoxia ([place]) 09/14/2014  . Lobar pneumonia due to unspecified organism 09/14/2014  . Protein-calorie malnutrition, severe ([place]) 09/14/2014  . Pressure ulcer 09/11/2014  . Breast cancer of upper-outer quadrant of left female breast ([place]) 05/26/2013  . CAD in native artery, non obstructive by cath 2000 09/23/2012  . Dyslipidemia 09/21/2012  . Hypertension 09/21/2012  . PAF (paroxysmal atrial fibrillation), maintaining SR 05/10/2012  . Long term current use of anticoagulant therapy 05/10/2012    Past Surgical History:  Procedure Laterality Date  . BREAST BIOPSY Left ~ 2006  . CARDIAC CATHETERIZATION  11/1998  . CATARACT EXTRACTION W/ INTRAOCULAR LENS IMPLANT Left 2000's  . FEMUR IM NAIL Left 09/21/2012   Procedure: INTRAMEDULLARY (IM) NAIL FEMORAL;  Surgeon: Newt Minion, MD;  Location: [facility];  Service: Orthopedics;  Laterality: Left;  . HERNIA REPAIR  1950's?     OB History   None      Home Medications    Prior to Admission medications   Medication Sig Start Date End Date Taking? Authorizing Provider  ALPRAZolam (XANAX) 0.25 MG tablet Take 1 tablet (0.25 mg total) by mouth 2 (two) times daily as needed for anxiety. Take one tablet by mouth every 12 hours as needed for severe anxiety 01/29/17  Yes Rosita Fire, MD  amiodarone (PACERONE) 200 MG tablet Take 200 mg by mouth every morning.  12/06/11  Yes Alvstad, Kristin L, RPH-CPP  atorvastatin (LIPITOR) 40 MG tablet Take 40 mg daily by mouth.   Yes [provider]  calcium-vitamin D (OSCAL WITH D) 500-200 MG-UNIT per tablet Take 1 tablet by mouth every morning.    Yes [provider]  denosumab (PROLIA) 60 MG/ML SOLN injection Inject 60 mg into the skin every 6 (six) months. Administer in upper arm, thigh, or abdomen   Yes [provider]  ELIQUIS 2.5 MG TABS tablet Take 2.5 mg by mouth 2 (two) times daily.  03/10/13  Yes  [provider]  feeding supplement, ENSURE ENLIVE, (ENSURE ENLIVE) LIQD Take 237 mLs by mouth 2 (two) times daily between meals. 09/15/14  Yes Hongalgi, Lenis Dickinson, MD  fluticasone furoate-vilanterol (BREO ELLIPTA) 200-25 MCG/INH AEPB Inhale 1 puff into the lungs as needed (sob).    Yes [provider]  furosemide (LASIX) 40 MG tablet Take 1 tablet (40 mg total) by mouth daily. 10/14/12  Yes [name], MD  HYDROcodone-acetaminophen (NORCO/VICODIN) 5-325 MG tablet Take 1 tablet by mouth every 6 (six) hours as needed for severe pain. 01/29/17  Yes Rosita Fire, MD  letrozole [facility]) 2.5 MG tablet Take 1 tablet (2.5 mg total) by mouth daily. 04/22/17  Yes Causey, Charlestine Massed, NP  lidocaine (LIDODERM) 5 % Place 1 patch onto the skin. UNWRAP AND APPLY 1 PATCH TO SKIN DAILY FOR 12 HOURS AND 12 HOURS OFF 11/22/16  Yes [provider]  Multiple Vitamin (MULTIVITAMIN WITH MINERALS) TABS Take 1 tablet by mouth every morning. Centrum 50+   Yes [provider]  olmesartan (BENICAR) 20 MG tablet Take 20 mg by mouth daily.   Yes [provider]  ondansetron (ZOFRAN) 4 MG tablet Take 4 mg by mouth every 8 (eight) hours as needed for nausea or vomiting.   Yes [provider]  polyethylene glycol (MIRALAX / GLYCOLAX) packet Take 17 g by mouth daily as needed for mild constipation or moderate constipation.    Yes [provider]  potassium chloride SA (K-DUR,KLOR-CON) 20 MEQ tablet Take 20 mEq by mouth daily.  04/13/13  Yes [provider]  senna (SENOKOT) 8.6 MG tablet Take 1 tablet by mouth daily.   Yes [provider]  traMADol (ULTRAM) 50 MG tablet Take 1 tablet (50 mg total) every 8 (eight) hours as needed by mouth for moderate pain or severe pain. 11/29/16  Yes Hosie Poisson, MD  Vitamin D, Ergocalciferol, (DRISDOL) 50000 UNITS CAPS Take 50,000 Units by mouth every 7 (seven) days.   Yes [provider]    Family  History Family History  Problem Relation Age of Onset  . Cancer Mother   . CAD Father   . Cancer Other     Social History Social History   Tobacco Use  . Smoking status: Never Smoker  . Smokeless tobacco: Never Used  Substance Use Topics  . Alcohol use: No  . Drug use: No     Allergies   Codeine   Review of Systems Review of Systems  Constitutional: Positive for appetite change and fatigue. Negative for fever.  HENT: Negative for sore throat.   Eyes: Negative for visual disturbance.  Respiratory: Negative for cough and shortness of breath.   Cardiovascular: Negative for chest pain.  Gastrointestinal: Positive for nausea. Negative for abdominal pain and vomiting.  Genitourinary: Negative for difficulty urinating.  Musculoskeletal: Positive for arthralgias and gait problem. Negative for back pain and neck pain.  Skin: Negative for rash.  Neurological: Negative for syncope and headaches.     Physical Exam Updated Vital Signs BP (!) 101/56 (BP Location: Right Arm)   Pulse 94   Temp 98.3 F (36.8 C) (Oral)   Resp 18   Ht 5\' 4"  (1.626 m)   Wt 45.4 kg (100 lb)   SpO2 100%   BMI 17.16 kg/m   Physical Exam  Constitutional: She is oriented to person, place, and time. She appears well-developed and well-nourished. No distress.  HENT:  Head: Normocephalic and atraumatic.  Eyes: Conjunctivae and EOM are normal.  Neck: Normal range of motion.  Cardiovascular: Normal rate, regular rhythm, normal heart sounds and intact distal pulses. Exam reveals no gallop and no friction rub.  No murmur heard. Pulmonary/Chest: Effort normal and breath sounds normal. No respiratory distress. She has no wheezes. She has no rales.  Abdominal: Soft. She exhibits no distension. There is no tenderness. There is no guarding.  Musculoskeletal: She exhibits no edema.       Left knee: She exhibits decreased range of motion, swelling, effusion and bony tenderness. She exhibits no ecchymosis, no  deformity, no laceration and no erythema. Tenderness found.  Neurological: She is alert and oriented to person, place, and time.  Skin: Skin is warm and dry. No rash noted. She is not diaphoretic. No erythema.  Nursing note and vitals reviewed.    ED Treatments / Results  Labs (all labs ordered are listed, but only abnormal results are displayed) Labs Reviewed - No data to display  EKG None  Radiology Ct Knee Left Wo Contrast  Result Date: 05/07/2017 CLINICAL DATA:  Acute onset of left knee pain. Unable to put weight on left knee. Initial encounter. EXAM: CT OF THE LEFT KNEE WITHOUT CONTRAST TECHNIQUE: Multidetector CT imaging of the left knee was performed according to the standard protocol. Multiplanar CT image reconstructions were also generated. COMPARISON:  Left knee radiographs performed earlier today at 9:34 p.m. FINDINGS: Bones/Joint/Cartilage There is no evidence of fracture or dislocation. There is diffuse osteopenia of visualized osseous structures. Mild marginal osteophyte formation is noted at the medial and lateral compartments, and mild cortical irregularity is noted along the anterior surface of the patella. A small to moderate knee joint effusion is noted. Ligaments Suboptimally assessed by CT. Calcification is noted at the lateral collateral ligament complex. Muscles and Tendons The visualized musculature is unremarkable in appearance. The quadriceps and patellar tendons are grossly unremarkable. Soft tissues There is calcification of the menisci, and chondrocalcinosis is noted. Scattered vascular calcifications are seen. A small Baker's cyst is noted. IMPRESSION: 1. No evidence of fracture or dislocation. 2. Small to moderate knee joint effusion. 3. Diffuse osteopenia of visualized osseous structures. Mild osteoarthritis at the left knee. 4. Scattered vascular calcifications. 5. Small Baker's cyst noted. Electronically Signed   By: Garald Balding M.D.   On: 05/07/2017 22:52    Dg Knee Complete 4 Views Left  Result Date: 05/07/2017 CLINICAL DATA:  Acute onset of left knee pain.  Initial encounter. EXAM: LEFT KNEE - COMPLETE 4+ VIEW COMPARISON:  None. FINDINGS: There is no evidence of fracture or dislocation. There is diffuse osteopenia of visualized osseous structures. Chondrocalcinosis is noted. Mild marginal osteophytes are noted. No significant joint effusion is seen. Diffuse vascular calcifications are seen. IMPRESSION: 1. No evidence of fracture or dislocation. 2. Mild osteoarthritis noted. Diffuse osteopenia of visualized osseous structures. Chondrocalcinosis noted. 3. Diffuse vascular calcifications seen. Electronically Signed   By: Garald Balding M.D.   On: 05/07/2017  21:58    Procedures Procedures (including critical care time)  Medications Ordered in ED Medications  fentaNYL (SUBLIMAZE) injection 25 mcg (25 mcg Intravenous Given 05/07/17 2230)  ondansetron (ZOFRAN) injection 4 mg (4 mg Intravenous Given 05/07/17 2230)  sodium chloride 0.9 % bolus 1,000 mL (0 mLs Intravenous Stopped 05/08/17 0035)     Initial Impression / Assessment and Plan / ED Course  I have reviewed the triage vital signs and the nursing notes.  Pertinent labs & imaging results that were available during my care of the patient were reviewed by me and considered in my medical decision making (see chart for details).     84yo female with history above presents with concern for left knee pain. XR shows no acute fracture.  Pt has had history of tibial plateau fracture that happened with regular activity/sponaneously and given concern for similar presentation, ordered CT to further evaluate for occult fracture. CT with effusion, no clear fracture or dislocation.  Possible ligamentous injury, osteoarthritis, septic joint or gout.  Discussed recommendation to do arthrocentesis to rule out septic joint given severe pain and swelling, including risks and benefits. Daughter and patient decline  arthrocentesis, understanding we are unable to diagnose septic joint at this time, and knowing risk of septic joint including joint destruction, disability, development of sepsis.  They will follow up closely with Orthopedics and understand reasons to return.  Pt did not feel well after taking pain medications this AM and has had decreased appetite and nausea, likely side effect of medication-given IV fluid.  Discharged with understanding of risks and reasons to return.   Final Clinical Impressions(s) / ED Diagnoses   Final diagnoses:  Acute pain of left knee  Arthritis of left knee  Effusion of left knee joint    ED Discharge Orders    None       Gareth Morgan, MD 05/08/17 [phone]

## 2017-05-07 NOTE — ED Triage Notes (Addendum)
Per EMS pt woke up this morning c/o left knee pain. No trauma. Unable to put weight on knee. Hx of osteoarthritis. Recent fracture to left knee. Pt A/Ox4. Pt has lidocaine patch on left knee.

## 2017-05-07 NOTE — ED Notes (Signed)
Called lab and they have blood to run CBC with Differential, BMP, Sedimentation rate, c-reactive protein. They will run results at this time.

## 2017-05-07 NOTE — ED Notes (Signed)
Dr. Schlossman at bedside. 

## 2017-05-08 DIAGNOSIS — R4182 Altered mental status, unspecified: Secondary | ICD-10-CM | POA: Diagnosis not present

## 2017-05-08 DIAGNOSIS — S8990XA Unspecified injury of unspecified lower leg, initial encounter: Secondary | ICD-10-CM | POA: Diagnosis not present

## 2017-05-08 NOTE — ED Notes (Signed)
[facility] called for transport.  

## 2017-06-26 ENCOUNTER — Other Ambulatory Visit: Payer: Self-pay | Admitting: *Deleted

## 2017-06-26 DIAGNOSIS — C50412 Malignant neoplasm of upper-outer quadrant of left female breast: Secondary | ICD-10-CM

## 2017-06-26 NOTE — Progress Notes (Signed)
[place]  [phone] [phone]     ID: Holly Weaver OB: 08/22/1923  MR#: [id number]  XLK#:[id number]  PCP: [name], MD GYN:   SU:  OTHER MD: Meridee Score  CHIEF COMPLAINT:Estrogen receptor positive breast cancer  CURRENT TREATMENT: Letrozole  BREAST CANCER HISTORY: From Dr. Collier [name]'s intake note dated 03/28/2005  Ms. Stocking only had two mammograms in her lifetime.  She noted some change in her left breast, but only about a year ago.  She did undergo a mammogram on 02/16/2005, which showed a possible mass in the left breast.  Left diagnostic mammogram and ultrasound was performed on 03/07/2005, this showed a spiculated mass in the left breast.  Core biopsy was recommended.  Sonographically, this was a hypoechoic mass measuring 1.9 x 1.9 cm at the 2 o'clock position approximately 3.0 cm from the nipple.  Biopsy took place on 03/07/2005, which showed invasive mammary carcinoma intermediate grade.  This is strongly ER and PR positive 95% and 13% respectively.  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  The patient's subsequent history is as detailed below  INTERVAL HISTORY: Holly Weaver returns today for follow-up of her estrogen receptor positive breast cancer accompanied by her daughter.  Holly Weaver continues on letrozole, generally with good tolerance. She denies issues with hot flashes or vaginal dryness.   She says she missed her Prolia shot due to having shingles in 2017, and in 2018 she missed her shot because she wasn't feeling well. She is scheduled to have a shot in August 2019 at Dr. Raul Del office.   Her daughter is concerned about the patient breaking her ribs while getting mammograms. She is wondering if the patient could receive ultrasounds instead. The patient is agreeable to this, however  Holly Weaver notes that she stopped getting ultrasounds because the results would always be negative.   Since her last visit, she completed a chest CT angiography on 01/27/2017 showing: No CT evidence for acute pulmonary embolus.  Bilateral lower lobe collapse/consolidation, slightly progressed in the interval. Two ground-glass nodules in the right lung. 15 mm ground-glass nodule is new in the interval. Non-contrast chest CT at 3-6 months is recommended. If the nodules are stable at time of repeat CT, then future CT at 18-24 months (from today's scan) is considered optional for low-risk patients, but is recommended for high-risk patients. New 3 mm right upper lobe pulmonary nodule. This could be reassessed at the time of followup CT   REVIEW OF SYSTEMS: Holly Weaver reports that she had a fractured left knee. She had surgery under Dr. Ninfa Linden. She went to [facility]  fac[facility].  She then developed pneumonia in January 2019. She is now at home. She does chair exercises everyday. She sits in her recliner chair for most of the day, and she has a rolling walker. She denies having any other falls. She denies unusual headaches, visual changes, nausea, vomiting, or dizziness. There has been no unusual cough, phlegm production, or pleurisy. This been no change in bowel or bladder habits. She denies unexplained fatigue or unexplained weight loss, bleeding, rash, or fever. A detailed review of systems was otherwise stable.    PAST MEDICAL HISTORY: Past Medical History:  Diagnosis Date  . Arthritis    "hands and legs" (10/09/2012)  . Atrial fibrillation ([place])   . Breast cancer (  [place]) ~ 2006   "left" (10/09/2012)  . Cervical spondylolysis 09/21/2012  . Chronic lower back pain   . Compression fracture 09/21/2012  . COPD (chronic obstructive pulmonary disease) ([place])   . Dyslipidemia 09/21/2012  . Heart murmur   . History of blood transfusion    "years and years ago; I was  anemic" (10/09/2012)  . Hypertension   . MVP (mitral valve prolapse) 09/23/2012  . Osteoarthritis 09/21/2012  . Osteoporosis   . Scoliosis   . Shortness of breath    "just now; nose is dry" (10/09/2012)    PAST SURGICAL HISTORY: Past Surgical History:  Procedure Laterality Date  . BREAST BIOPSY Left ~ 2006  . CARDIAC CATHETERIZATION  11/1998  . CATARACT EXTRACTION W/ INTRAOCULAR LENS IMPLANT Left 2000's  . FEMUR IM NAIL Left 09/21/2012   Procedure: INTRAMEDULLARY (IM) NAIL FEMORAL;  Surgeon: Newt Minion, MD;  Location: [facility];  Service: Orthopedics;  Laterality: Left;  . HERNIA REPAIR  1950's?    FAMILY HISTORY Family History  Problem Relation Age of Onset  . Cancer Mother   . CAD Father   . Cancer Other    the patient's father died at the age of 40 from postoperative complications. The patient's mother died at the age of 53 with what sounds like lung cancer although the patient stated sinus cancer. The patient had 2 brothers, no sisters. There is no history of breast or ovarian cancer in the immediate family.  GYNECOLOGIC HISTORY:  Menarche age 42, first live birth age 54, the patient is [place] P2. She went to the change of life in her 64s. Gender took hormone replacement.  SOCIAL HISTORY:  Holly Weaver used to works for [employer]. She is now retired. She was separated from her husband who subsequently died. The patient lives alone, with no pets, but her daughter Holly Weaver is there just about every day. 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 The patient's son had a brain stem injury from a motorcycle accident and cannot walk. He is married and lives independently. The patient has 4 grandchildren and 5 great-grandchildren. She is a Tourist information centre manager    ADVANCED DIRECTIVES: In place; the patient's daughter Holly Weaver is healthcare power of attorney   HEALTH MAINTENANCE: Social History   Tobacco Use  . Smoking status: Never Smoker  . Smokeless tobacco:  Never Used  Substance Use Topics  . Alcohol use: No  . Drug use: No     Colonoscopy:  PAP:  Bone density:  Lipid panel:  Allergies  Allergen Reactions  . Codeine Nausea And Vomiting    Current Outpatient Medications  Medication Sig Dispense Refill  . ALPRAZolam (XANAX) 0.25 MG tablet Take 1 tablet (0.25 mg total) by mouth 2 (two) times daily as needed for anxiety. Take one tablet by mouth every 12 hours as needed for severe anxiety 8 tablet 0  . amiodarone (PACERONE) 200 MG tablet Take 200 mg by mouth every morning.     Marland Kitchen atorvastatin (LIPITOR) 40 MG tablet Take 40 mg daily by mouth.    . calcium-vitamin D (OSCAL WITH D) 500-200 MG-UNIT per tablet Take 1 tablet by mouth every morning.     . denosumab (PROLIA) 60 MG/ML SOLN injection Inject 60 mg into the skin every 6 (six) months. Administer in upper arm, thigh, or abdomen    . ELIQUIS 2.5 MG TABS tablet Take 2.5 mg by mouth 2 (two) times daily.     . feeding  supplement, ENSURE ENLIVE, (ENSURE ENLIVE) LIQD Take 237 mLs by mouth 2 (two) times daily between meals.    . fluticasone furoate-vilanterol (BREO ELLIPTA) 200-25 MCG/INH AEPB Inhale 1 puff into the lungs as needed (sob).     . furosemide (LASIX) 40 MG tablet Take 1 tablet (40 mg total) by mouth daily. 30 tablet 0  . HYDROcodone-acetaminophen (NORCO/VICODIN) 5-325 MG tablet Take 1 tablet by mouth every 6 (six) hours as needed for severe pain. 5 tablet 0  . letrozole (FEMARA) 2.5 MG tablet Take 1 tablet (2.5 mg total) by mouth daily. 90 tablet 4  . lidocaine (LIDODERM) 5 % Place 1 patch onto the skin. UNWRAP AND APPLY 1 PATCH TO SKIN DAILY FOR 12 HOURS AND 12 HOURS OFF  0  . Multiple Vitamin (MULTIVITAMIN WITH MINERALS) TABS Take 1 tablet by mouth every morning. Centrum 50+    . olmesartan (BENICAR) 20 MG tablet Take 20 mg by mouth daily.    . ondansetron (ZOFRAN) 4 MG tablet Take 4 mg by mouth every 8 (eight) hours as needed for nausea or vomiting.    . polyethylene glycol  (MIRALAX / GLYCOLAX) packet Take 17 g by mouth daily as needed for mild constipation or moderate constipation.     . potassium chloride SA (K-DUR,KLOR-CON) 20 MEQ tablet Take 20 mEq by mouth daily.     Marland Kitchen senna (SENOKOT) 8.6 MG tablet Take 1 tablet by mouth daily.    . traMADol (ULTRAM) 50 MG tablet Take 1 tablet (50 mg total) every 8 (eight) hours as needed by mouth for moderate pain or severe pain. 4 tablet 0  . Vitamin D, Ergocalciferol, (DRISDOL) 50000 UNITS CAPS Take 50,000 Units by mouth every 7 (seven) days.     No current facility-administered medications for this visit.     OBJECTIVE: Older white woman examined in a wheelchair  Vitals:   06/27/17 1354  BP: (!) 141/63  Pulse: 89  Resp: 18  Temp: 97.8 F (36.6 C)  SpO2: 99%     Body mass index is 17.16 kg/m.    ECOG FS:2 - Symptomatic, <50% confined to bed  Sclerae unicteric, pupils round and equal Oropharynx moderately dry No cervical or supraclavicular adenopathy Lungs no rales or rhonchi Heart regular rate and rhythm Abd soft, nontender, positive bowel sounds MSK moderate kyphosis but no focal spinal tenderness, no upper extremity lymphedema Neuro: nonfocal, well oriented, feisty affect Breasts: Both breasts are atrophic.  There are changes in the left breast slightly altering the contour.  There is not a well-defined mass for the left breast is slightly lumpier than the right.  Both axillae are benign.  LAB RESULTS:  CMP     Component Value Date/Time   NA 138 06/27/2017 1324   NA 141 06/28/2016 1306   K 3.4 (L) 06/27/2017 1324   K 4.6 06/28/2016 1306   CL 99 06/27/2017 1324   CO2 30 (H) 06/27/2017 1324   CO2 31 (H) 06/28/2016 1306   GLUCOSE 94 06/27/2017 1324   GLUCOSE 91 06/28/2016 1306   BUN 17 06/27/2017 1324   BUN 24.7 06/28/2016 1306   CREATININE 1.08 06/27/2017 1324   CREATININE 1.5 (H) 06/28/2016 1306   CALCIUM 9.4 06/27/2017 1324   CALCIUM 10.1 06/28/2016 1306   PROT 6.4 06/27/2017 1324   PROT  6.8 06/28/2016 1306   ALBUMIN 3.5 06/27/2017 1324   ALBUMIN 4.1 06/28/2016 1306   AST 27 06/27/2017 1324   AST 23 06/28/2016 1306   ALT 17  06/27/2017 1324   ALT 15 06/28/2016 1306   ALKPHOS 90 06/27/2017 1324   ALKPHOS 61 06/28/2016 1306   BILITOT 1.1 06/27/2017 1324   BILITOT 0.92 06/28/2016 1306   GFRNONAA 44 (L) 06/27/2017 1324   GFRAA 50 (L) 06/27/2017 1324    I No results found for: SPEP  Lab Results  Component Value Date   WBC 5.4 06/27/2017   NEUTROABS 3.8 06/27/2017   HGB 11.8 06/27/2017   HCT 36.7 06/27/2017   MCV 100.8 06/27/2017   PLT 202 06/27/2017      Chemistry      Component Value Date/Time   NA 138 06/27/2017 1324   NA 141 06/28/2016 1306   K 3.4 (L) 06/27/2017 1324   K 4.6 06/28/2016 1306   CL 99 06/27/2017 1324   CO2 30 (H) 06/27/2017 1324   CO2 31 (H) 06/28/2016 1306   BUN 17 06/27/2017 1324   BUN 24.7 06/28/2016 1306   CREATININE 1.08 06/27/2017 1324   CREATININE 1.5 (H) 06/28/2016 1306      Component Value Date/Time   CALCIUM 9.4 06/27/2017 1324   CALCIUM 10.1 06/28/2016 1306   ALKPHOS 90 06/27/2017 1324   ALKPHOS 61 06/28/2016 1306   AST 27 06/27/2017 1324   AST 23 06/28/2016 1306   ALT 17 06/27/2017 1324   ALT 15 06/28/2016 1306   BILITOT 1.1 06/27/2017 1324   BILITOT 0.92 06/28/2016 1306       Lab Results  Component Value Date   LABCA2 16 04/14/2010    No components found for: SFKCL275  No results for input(s): INR in the last 168 hours.  Urinalysis    Component Value Date/Time   COLORURINE YELLOW 11/28/2016 2045   APPEARANCEUR HAZY (A) 11/28/2016 2045   LABSPEC 1.011 11/28/2016 2045   PHURINE 7.0 11/28/2016 2045   GLUCOSEU NEGATIVE 11/28/2016 2045   HGBUR SMALL (A) 11/28/2016 2045   BILIRUBINUR NEGATIVE 11/28/2016 2045   KETONESUR NEGATIVE 11/28/2016 2045   PROTEINUR NEGATIVE 11/28/2016 2045   UROBILINOGEN 1.0 09/11/2014 0111   NITRITE NEGATIVE 11/28/2016 2045   LEUKOCYTESUR LARGE (A) 11/28/2016 2045     STUDIES: No results found.   ASSESSMENT: 84 y.o. s/p left breast upper outer quadrant biopsy 03/07/2005 for a clinical T2 N0, stage IIA invasive ductal carcinoma, grade 2, estrogen receptor 95% positive, progesterone receptor 13% positive, HER-2 nonamplified  (a) the patient opted to forgo surgery and radiation   (b) on letrozole since March of 2007.  (1) osteoporosis: on Prolia every 6 months  PLAN:  Joeanne is now 12 years out from initial diagnosis of her breast cancer.  She continues on anastrozole chiefly because she never did have surgery or radiation.  Of course we are concerned that anastrozole promotes bone density loss, but this is usually corrected readily with denosumab/Prolia, which she had been receiving until a year ago.  Megon is thinking that she does not want to receive Prolia but I explained if she does not I would not be able to give her any further anastrozole.  Her daughter is worried that the squeezing from mammography might be too much for her.  I am going to get her an ultrasound of the left breast which is much better tolerated.  At this point accordingly the plan is for her to return in 1 year and to continue anastrozole indefinitely so long as she continues the denosumab/Prolia through Dr. Raul Del office  She knows to call me for any issues that may develop before  the next visit.  Chaya Dehaan, Virgie Dad, MD  06/27/17 2:17 PM Medical Oncology and Hematology [facility] [address] [place], [place] [address] Tel. [phone]    Fax. [phone]  Alice Rieger, am acting as scribe for [name] MD.  I, Lurline Del MD, have reviewed the above documentation for accuracy and completeness, and I agree with the above.

## 2017-06-27 ENCOUNTER — Inpatient Hospital Stay: Payer: Medicare Other

## 2017-06-27 ENCOUNTER — Inpatient Hospital Stay: Payer: Medicare Other | Attending: Oncology | Admitting: Oncology

## 2017-06-27 VITALS — BP 141/63 | HR 89 | Temp 97.8°F | Resp 18 | Ht 64.0 in

## 2017-06-27 DIAGNOSIS — Z79811 Long term (current) use of aromatase inhibitors: Secondary | ICD-10-CM

## 2017-06-27 DIAGNOSIS — C50412 Malignant neoplasm of upper-outer quadrant of left female breast: Secondary | ICD-10-CM

## 2017-06-27 DIAGNOSIS — Z170 Estrogen receptor positive status [ER+]: Secondary | ICD-10-CM | POA: Insufficient documentation

## 2017-06-27 DIAGNOSIS — M810 Age-related osteoporosis without current pathological fracture: Secondary | ICD-10-CM | POA: Diagnosis not present

## 2017-06-27 LAB — CBC WITH DIFFERENTIAL (CANCER CENTER ONLY)
BASOS ABS: 0 10*3/uL (ref 0.0–0.1)
BASOS PCT: 0 %
Eosinophils Absolute: 0.1 10*3/uL (ref 0.0–0.5)
Eosinophils Relative: 2 %
HEMATOCRIT: 36.7 % (ref 34.8–46.6)
HEMOGLOBIN: 11.8 g/dL (ref 11.6–15.9)
LYMPHS PCT: 18 %
Lymphs Abs: 1 10*3/uL (ref 0.9–3.3)
MCH: 32.4 pg (ref 25.1–34.0)
MCHC: 32.2 g/dL (ref 31.5–36.0)
MCV: 100.8 fL (ref 79.5–101.0)
Monocytes Absolute: 0.5 10*3/uL (ref 0.1–0.9)
Monocytes Relative: 8 %
NEUTROS ABS: 3.8 10*3/uL (ref 1.5–6.5)
NEUTROS PCT: 72 %
Platelet Count: 202 10*3/uL (ref 145–400)
RBC: 3.64 MIL/uL — AB (ref 3.70–5.45)
RDW: 15.9 % — ABNORMAL HIGH (ref 11.2–14.5)
WBC Count: 5.4 10*3/uL (ref 3.9–10.3)

## 2017-06-27 LAB — CMP (CANCER CENTER ONLY)
ALBUMIN: 3.5 g/dL (ref 3.5–5.0)
ALK PHOS: 90 U/L (ref 40–150)
ALT: 17 U/L (ref 0–55)
ANION GAP: 9 (ref 3–11)
AST: 27 U/L (ref 5–34)
BILIRUBIN TOTAL: 1.1 mg/dL (ref 0.2–1.2)
BUN: 17 mg/dL (ref 7–26)
CO2: 30 mmol/L — ABNORMAL HIGH (ref 22–29)
Calcium: 9.4 mg/dL (ref 8.4–10.4)
Chloride: 99 mmol/L (ref 98–109)
Creatinine: 1.08 mg/dL (ref 0.60–1.10)
GFR, EST AFRICAN AMERICAN: 50 mL/min — AB (ref >60)
GFR, Estimated: 44 mL/min — ABNORMAL LOW (ref >60)
Glucose, Bld: 94 mg/dL (ref 70–140)
POTASSIUM: 3.4 mmol/L — AB (ref 3.5–5.1)
SODIUM: 138 mmol/L (ref 136–145)
TOTAL PROTEIN: 6.4 g/dL (ref 6.4–8.3)

## 2017-06-27 MED ORDER — LETROZOLE 2.5 MG PO TABS: 2.5000 mg | ORAL_TABLET | Freq: Every day | ORAL | 4 refills | Status: DC

## 2017-07-15 ENCOUNTER — Other Ambulatory Visit: Payer: Self-pay | Admitting: Oncology

## 2017-07-15 DIAGNOSIS — C50412 Malignant neoplasm of upper-outer quadrant of left female breast: Secondary | ICD-10-CM

## 2017-09-27 DIAGNOSIS — N184 Chronic kidney disease, stage 4 (severe): Secondary | ICD-10-CM | POA: Diagnosis not present

## 2017-09-27 DIAGNOSIS — I480 Paroxysmal atrial fibrillation: Secondary | ICD-10-CM | POA: Diagnosis not present

## 2017-09-27 DIAGNOSIS — C50919 Malignant neoplasm of unspecified site of unspecified female breast: Secondary | ICD-10-CM | POA: Diagnosis not present

## 2017-09-27 DIAGNOSIS — I10 Essential (primary) hypertension: Secondary | ICD-10-CM | POA: Diagnosis not present

## 2017-09-27 DIAGNOSIS — R7301 Impaired fasting glucose: Secondary | ICD-10-CM | POA: Diagnosis not present

## 2017-09-27 DIAGNOSIS — Z23 Encounter for immunization: Secondary | ICD-10-CM | POA: Diagnosis not present

## 2017-09-27 DIAGNOSIS — M810 Age-related osteoporosis without current pathological fracture: Secondary | ICD-10-CM | POA: Diagnosis not present

## 2017-09-27 DIAGNOSIS — M5489 Other dorsalgia: Secondary | ICD-10-CM | POA: Diagnosis not present

## 2017-09-27 DIAGNOSIS — E785 Hyperlipidemia, unspecified: Secondary | ICD-10-CM | POA: Diagnosis not present

## 2017-09-27 DIAGNOSIS — Z7901 Long term (current) use of anticoagulants: Secondary | ICD-10-CM | POA: Diagnosis not present

## 2017-10-22 ENCOUNTER — Ambulatory Visit (HOSPITAL_COMMUNITY): Payer: Medicare Other

## 2018-05-30 DIAGNOSIS — Z0000 Encounter for general adult medical examination without abnormal findings: Secondary | ICD-10-CM | POA: Diagnosis not present

## 2018-05-30 DIAGNOSIS — Z7901 Long term (current) use of anticoagulants: Secondary | ICD-10-CM | POA: Diagnosis not present

## 2018-05-30 DIAGNOSIS — Z86711 Personal history of pulmonary embolism: Secondary | ICD-10-CM | POA: Diagnosis not present

## 2018-05-30 DIAGNOSIS — I480 Paroxysmal atrial fibrillation: Secondary | ICD-10-CM | POA: Diagnosis not present

## 2018-05-30 DIAGNOSIS — I129 Hypertensive chronic kidney disease with stage 1 through stage 4 chronic kidney disease, or unspecified chronic kidney disease: Secondary | ICD-10-CM | POA: Diagnosis not present

## 2018-05-30 DIAGNOSIS — Z1331 Encounter for screening for depression: Secondary | ICD-10-CM | POA: Diagnosis not present

## 2018-05-30 DIAGNOSIS — E039 Hypothyroidism, unspecified: Secondary | ICD-10-CM | POA: Diagnosis not present

## 2018-05-30 DIAGNOSIS — R7301 Impaired fasting glucose: Secondary | ICD-10-CM | POA: Diagnosis not present

## 2018-05-30 DIAGNOSIS — M810 Age-related osteoporosis without current pathological fracture: Secondary | ICD-10-CM | POA: Diagnosis not present

## 2018-05-30 DIAGNOSIS — E785 Hyperlipidemia, unspecified: Secondary | ICD-10-CM | POA: Diagnosis not present

## 2018-05-30 DIAGNOSIS — N184 Chronic kidney disease, stage 4 (severe): Secondary | ICD-10-CM | POA: Diagnosis not present

## 2018-05-30 DIAGNOSIS — J479 Bronchiectasis, uncomplicated: Secondary | ICD-10-CM | POA: Diagnosis not present

## 2018-05-30 DIAGNOSIS — E7849 Other hyperlipidemia: Secondary | ICD-10-CM | POA: Diagnosis not present

## 2018-05-30 DIAGNOSIS — I10 Essential (primary) hypertension: Secondary | ICD-10-CM | POA: Diagnosis not present

## 2018-05-30 DIAGNOSIS — I2510 Atherosclerotic heart disease of native coronary artery without angina pectoris: Secondary | ICD-10-CM | POA: Diagnosis not present

## 2018-06-11 ENCOUNTER — Encounter (HOSPITAL_COMMUNITY): Payer: Medicare Other

## 2018-07-03 DIAGNOSIS — Z85828 Personal history of other malignant neoplasm of skin: Secondary | ICD-10-CM | POA: Diagnosis not present

## 2018-07-03 DIAGNOSIS — L309 Dermatitis, unspecified: Secondary | ICD-10-CM | POA: Diagnosis not present

## 2018-07-11 ENCOUNTER — Other Ambulatory Visit: Payer: Self-pay | Admitting: *Deleted

## 2018-08-04 ENCOUNTER — Other Ambulatory Visit: Payer: Self-pay | Admitting: *Deleted

## 2018-08-04 ENCOUNTER — Other Ambulatory Visit: Payer: Self-pay | Admitting: Oncology

## 2018-08-04 DIAGNOSIS — C50412 Malignant neoplasm of upper-outer quadrant of left female breast: Secondary | ICD-10-CM

## 2018-08-04 MED ORDER — LETROZOLE 2.5 MG PO TABS: 2.5000 mg | ORAL_TABLET | Freq: Every day | ORAL | 4 refills | Status: DC

## 2018-08-04 MED ORDER — LETROZOLE 2.5 MG PO TABS: 2.5000 mg | ORAL_TABLET | Freq: Every day | ORAL | 4 refills | Status: AC

## 2018-08-05 ENCOUNTER — Other Ambulatory Visit: Payer: Medicare Other

## 2018-08-05 ENCOUNTER — Ambulatory Visit: Payer: Medicare Other | Admitting: Oncology

## 2018-10-03 ENCOUNTER — Telehealth: Payer: Self-pay | Admitting: Oncology

## 2018-10-03 NOTE — Telephone Encounter (Signed)
Returned patient's phone call regarding 09/24 appointment, spoke with patient's daughter and due to no visitation rule she does not feel comfortable sending her mother alone to these appointments. Per request appointment has been cancelled and patient will call to reschedule when comfortable to do so.   Message to provider.

## 2018-10-11 ENCOUNTER — Emergency Department (HOSPITAL_COMMUNITY): Payer: Medicare Other

## 2018-10-11 ENCOUNTER — Other Ambulatory Visit: Payer: Self-pay

## 2018-10-11 ENCOUNTER — Other Ambulatory Visit (HOSPITAL_COMMUNITY): Payer: Self-pay

## 2018-10-11 ENCOUNTER — Encounter (HOSPITAL_COMMUNITY): Payer: Self-pay | Admitting: *Deleted

## 2018-10-11 ENCOUNTER — Inpatient Hospital Stay (HOSPITAL_COMMUNITY)
Admission: EM | Admit: 2018-10-11 | Discharge: 2018-10-23 | DRG: 871 | Disposition: E | Payer: Medicare Other | Attending: Internal Medicine | Admitting: Internal Medicine

## 2018-10-11 DIAGNOSIS — Z7189 Other specified counseling: Secondary | ICD-10-CM | POA: Diagnosis not present

## 2018-10-11 DIAGNOSIS — S72009A Fracture of unspecified part of neck of unspecified femur, initial encounter for closed fracture: Secondary | ICD-10-CM | POA: Diagnosis not present

## 2018-10-11 DIAGNOSIS — M4302 Spondylolysis, cervical region: Secondary | ICD-10-CM | POA: Diagnosis present

## 2018-10-11 DIAGNOSIS — S72402A Unspecified fracture of lower end of left femur, initial encounter for closed fracture: Secondary | ICD-10-CM | POA: Diagnosis present

## 2018-10-11 DIAGNOSIS — S199XXA Unspecified injury of neck, initial encounter: Secondary | ICD-10-CM | POA: Diagnosis not present

## 2018-10-11 DIAGNOSIS — I10 Essential (primary) hypertension: Secondary | ICD-10-CM | POA: Diagnosis present

## 2018-10-11 DIAGNOSIS — M542 Cervicalgia: Secondary | ICD-10-CM | POA: Diagnosis not present

## 2018-10-11 DIAGNOSIS — G8929 Other chronic pain: Secondary | ICD-10-CM | POA: Diagnosis present

## 2018-10-11 DIAGNOSIS — M419 Scoliosis, unspecified: Secondary | ICD-10-CM | POA: Diagnosis present

## 2018-10-11 DIAGNOSIS — N183 Chronic kidney disease, stage 3 (moderate): Secondary | ICD-10-CM | POA: Diagnosis present

## 2018-10-11 DIAGNOSIS — I341 Nonrheumatic mitral (valve) prolapse: Secondary | ICD-10-CM | POA: Diagnosis present

## 2018-10-11 DIAGNOSIS — S299XXA Unspecified injury of thorax, initial encounter: Secondary | ICD-10-CM | POA: Diagnosis not present

## 2018-10-11 DIAGNOSIS — R6521 Severe sepsis with septic shock: Secondary | ICD-10-CM | POA: Diagnosis present

## 2018-10-11 DIAGNOSIS — I959 Hypotension, unspecified: Secondary | ICD-10-CM | POA: Diagnosis present

## 2018-10-11 DIAGNOSIS — E861 Hypovolemia: Secondary | ICD-10-CM | POA: Diagnosis present

## 2018-10-11 DIAGNOSIS — R680 Hypothermia, not associated with low environmental temperature: Secondary | ICD-10-CM | POA: Diagnosis present

## 2018-10-11 DIAGNOSIS — Z9842 Cataract extraction status, left eye: Secondary | ICD-10-CM

## 2018-10-11 DIAGNOSIS — I361 Nonrheumatic tricuspid (valve) insufficiency: Secondary | ICD-10-CM | POA: Diagnosis not present

## 2018-10-11 DIAGNOSIS — S72351A Displaced comminuted fracture of shaft of right femur, initial encounter for closed fracture: Secondary | ICD-10-CM | POA: Diagnosis not present

## 2018-10-11 DIAGNOSIS — H5704 Mydriasis: Secondary | ICD-10-CM | POA: Diagnosis not present

## 2018-10-11 DIAGNOSIS — J449 Chronic obstructive pulmonary disease, unspecified: Secondary | ICD-10-CM | POA: Diagnosis present

## 2018-10-11 DIAGNOSIS — W19XXXA Unspecified fall, initial encounter: Secondary | ICD-10-CM | POA: Diagnosis present

## 2018-10-11 DIAGNOSIS — E785 Hyperlipidemia, unspecified: Secondary | ICD-10-CM | POA: Diagnosis present

## 2018-10-11 DIAGNOSIS — R51 Headache: Secondary | ICD-10-CM | POA: Diagnosis not present

## 2018-10-11 DIAGNOSIS — Z8249 Family history of ischemic heart disease and other diseases of the circulatory system: Secondary | ICD-10-CM

## 2018-10-11 DIAGNOSIS — Z20828 Contact with and (suspected) exposure to other viral communicable diseases: Secondary | ICD-10-CM | POA: Diagnosis present

## 2018-10-11 DIAGNOSIS — E43 Unspecified severe protein-calorie malnutrition: Secondary | ICD-10-CM | POA: Diagnosis present

## 2018-10-11 DIAGNOSIS — Z79899 Other long term (current) drug therapy: Secondary | ICD-10-CM

## 2018-10-11 DIAGNOSIS — M25552 Pain in left hip: Secondary | ICD-10-CM | POA: Diagnosis not present

## 2018-10-11 DIAGNOSIS — R109 Unspecified abdominal pain: Secondary | ICD-10-CM | POA: Diagnosis not present

## 2018-10-11 DIAGNOSIS — D500 Iron deficiency anemia secondary to blood loss (chronic): Secondary | ICD-10-CM | POA: Diagnosis present

## 2018-10-11 DIAGNOSIS — E876 Hypokalemia: Secondary | ICD-10-CM | POA: Diagnosis not present

## 2018-10-11 DIAGNOSIS — A419 Sepsis, unspecified organism: Principal | ICD-10-CM | POA: Diagnosis present

## 2018-10-11 DIAGNOSIS — I5032 Chronic diastolic (congestive) heart failure: Secondary | ICD-10-CM | POA: Diagnosis present

## 2018-10-11 DIAGNOSIS — S72401A Unspecified fracture of lower end of right femur, initial encounter for closed fracture: Secondary | ICD-10-CM

## 2018-10-11 DIAGNOSIS — Z515 Encounter for palliative care: Secondary | ICD-10-CM | POA: Diagnosis not present

## 2018-10-11 DIAGNOSIS — Z79890 Hormone replacement therapy (postmenopausal): Secondary | ICD-10-CM

## 2018-10-11 DIAGNOSIS — N179 Acute kidney failure, unspecified: Secondary | ICD-10-CM

## 2018-10-11 DIAGNOSIS — Y92009 Unspecified place in unspecified non-institutional (private) residence as the place of occurrence of the external cause: Secondary | ICD-10-CM | POA: Diagnosis not present

## 2018-10-11 DIAGNOSIS — Z66 Do not resuscitate: Secondary | ICD-10-CM | POA: Diagnosis present

## 2018-10-11 DIAGNOSIS — N390 Urinary tract infection, site not specified: Secondary | ICD-10-CM

## 2018-10-11 DIAGNOSIS — R52 Pain, unspecified: Secondary | ICD-10-CM | POA: Diagnosis not present

## 2018-10-11 DIAGNOSIS — Z853 Personal history of malignant neoplasm of breast: Secondary | ICD-10-CM

## 2018-10-11 DIAGNOSIS — M810 Age-related osteoporosis without current pathological fracture: Secondary | ICD-10-CM | POA: Diagnosis present

## 2018-10-11 DIAGNOSIS — Z7401 Bed confinement status: Secondary | ICD-10-CM

## 2018-10-11 DIAGNOSIS — Z885 Allergy status to narcotic agent status: Secondary | ICD-10-CM

## 2018-10-11 DIAGNOSIS — Z7901 Long term (current) use of anticoagulants: Secondary | ICD-10-CM

## 2018-10-11 DIAGNOSIS — Z961 Presence of intraocular lens: Secondary | ICD-10-CM | POA: Diagnosis present

## 2018-10-11 DIAGNOSIS — I480 Paroxysmal atrial fibrillation: Secondary | ICD-10-CM | POA: Diagnosis present

## 2018-10-11 DIAGNOSIS — S0990XA Unspecified injury of head, initial encounter: Secondary | ICD-10-CM | POA: Diagnosis not present

## 2018-10-11 DIAGNOSIS — S72352A Displaced comminuted fracture of shaft of left femur, initial encounter for closed fracture: Secondary | ICD-10-CM | POA: Diagnosis not present

## 2018-10-11 DIAGNOSIS — M25562 Pain in left knee: Secondary | ICD-10-CM | POA: Diagnosis not present

## 2018-10-11 DIAGNOSIS — Z8781 Personal history of (healed) traumatic fracture: Secondary | ICD-10-CM

## 2018-10-11 DIAGNOSIS — R6520 Severe sepsis without septic shock: Secondary | ICD-10-CM | POA: Diagnosis not present

## 2018-10-11 DIAGNOSIS — M25551 Pain in right hip: Secondary | ICD-10-CM | POA: Diagnosis not present

## 2018-10-11 DIAGNOSIS — I340 Nonrheumatic mitral (valve) insufficiency: Secondary | ICD-10-CM | POA: Diagnosis not present

## 2018-10-11 DIAGNOSIS — Z79891 Long term (current) use of opiate analgesic: Secondary | ICD-10-CM

## 2018-10-11 LAB — SARS CORONAVIRUS 2 BY RT PCR (HOSPITAL ORDER, PERFORMED IN ~~LOC~~ HOSPITAL LAB): SARS Coronavirus 2: NEGATIVE

## 2018-10-11 LAB — TROPONIN I (HIGH SENSITIVITY)
Troponin I (High Sensitivity): 22 ng/L — ABNORMAL HIGH (ref <18)
Troponin I (High Sensitivity): 22 ng/L — ABNORMAL HIGH (ref <18)

## 2018-10-11 LAB — CBG MONITORING, ED: Glucose-Capillary: 189 mg/dL — ABNORMAL HIGH (ref 70–99)

## 2018-10-11 LAB — URINALYSIS, ROUTINE W REFLEX MICROSCOPIC
Bilirubin Urine: NEGATIVE
Glucose, UA: NEGATIVE mg/dL
Ketones, ur: NEGATIVE mg/dL
Nitrite: NEGATIVE
Protein, ur: 30 mg/dL — AB
Specific Gravity, Urine: 1.009 (ref 1.005–1.030)
WBC, UA: >50 WBC/hpf — ABNORMAL HIGH (ref 0–5)
pH: 7 (ref 5.0–8.0)

## 2018-10-11 LAB — COMPREHENSIVE METABOLIC PANEL
ALT: 29 U/L (ref 0–44)
AST: 43 U/L — ABNORMAL HIGH (ref 15–41)
Albumin: 3.5 g/dL (ref 3.5–5.0)
Alkaline Phosphatase: 82 U/L (ref 38–126)
Anion gap: 19 — ABNORMAL HIGH (ref 5–15)
BUN: 22 mg/dL (ref 8–23)
CO2: 22 mmol/L (ref 22–32)
Calcium: 9.1 mg/dL (ref 8.9–10.3)
Chloride: 95 mmol/L — ABNORMAL LOW (ref 98–111)
Creatinine, Ser: 1.56 mg/dL — ABNORMAL HIGH (ref 0.44–1.00)
GFR calc Af Amer: 33 mL/min — ABNORMAL LOW (ref >60)
GFR calc non Af Amer: 28 mL/min — ABNORMAL LOW (ref >60)
Glucose, Bld: 198 mg/dL — ABNORMAL HIGH (ref 70–99)
Potassium: 3.2 mmol/L — ABNORMAL LOW (ref 3.5–5.1)
Sodium: 136 mmol/L (ref 135–145)
Total Bilirubin: 1.1 mg/dL (ref 0.3–1.2)
Total Protein: 6.4 g/dL — ABNORMAL LOW (ref 6.5–8.1)

## 2018-10-11 LAB — CBC WITH DIFFERENTIAL/PLATELET
Abs Immature Granulocytes: 0.05 10*3/uL (ref 0.00–0.07)
Basophils Absolute: 0 10*3/uL (ref 0.0–0.1)
Basophils Relative: 0 %
Eosinophils Absolute: 0 10*3/uL (ref 0.0–0.5)
Eosinophils Relative: 0 %
HCT: 35.9 % — ABNORMAL LOW (ref 36.0–46.0)
Hemoglobin: 11.3 g/dL — ABNORMAL LOW (ref 12.0–15.0)
Immature Granulocytes: 0 %
Lymphocytes Relative: 6 %
Lymphs Abs: 0.8 10*3/uL (ref 0.7–4.0)
MCH: 34 pg (ref 26.0–34.0)
MCHC: 31.5 g/dL (ref 30.0–36.0)
MCV: 108.1 fL — ABNORMAL HIGH (ref 80.0–100.0)
Monocytes Absolute: 0.7 10*3/uL (ref 0.1–1.0)
Monocytes Relative: 5 %
Neutro Abs: 12.4 10*3/uL — ABNORMAL HIGH (ref 1.7–7.7)
Neutrophils Relative %: 89 %
Platelets: 171 10*3/uL (ref 150–400)
RBC: 3.32 MIL/uL — ABNORMAL LOW (ref 3.87–5.11)
RDW: 15.9 % — ABNORMAL HIGH (ref 11.5–15.5)
WBC: 14 10*3/uL — ABNORMAL HIGH (ref 4.0–10.5)
nRBC: 0 % (ref 0.0–0.2)

## 2018-10-11 LAB — PROTIME-INR
INR: 2.2 — ABNORMAL HIGH (ref 0.8–1.2)
Prothrombin Time: 24.1 seconds — ABNORMAL HIGH (ref 11.4–15.2)

## 2018-10-11 LAB — APTT: aPTT: 32 seconds (ref 24–36)

## 2018-10-11 LAB — LACTIC ACID, PLASMA
Lactic Acid, Venous: 5.6 mmol/L (ref 0.5–1.9)
Lactic Acid, Venous: 4 mmol/L (ref 0.5–1.9)

## 2018-10-11 LAB — CK: Total CK: 135 U/L (ref 38–234)

## 2018-10-11 LAB — MAGNESIUM: Magnesium: 1.9 mg/dL (ref 1.7–2.4)

## 2018-10-11 MED ORDER — SODIUM CHLORIDE 0.9 % IV SOLN
1.0000 g | INTRAVENOUS | Status: DC
  Administered 2018-10-12: 18:00:00 1 g via INTRAVENOUS
  Filled 2018-10-11: qty 1

## 2018-10-11 MED ORDER — SODIUM CHLORIDE 0.9 % IV BOLUS
1000.0000 mL | Freq: Once | INTRAVENOUS | Status: AC
  Administered 2018-10-11: 21:00:00 1000 mL via INTRAVENOUS

## 2018-10-11 MED ORDER — CALCIUM CARBONATE-VITAMIN D 500-200 MG-UNIT PO TABS
1.0000 | ORAL_TABLET | Freq: Every day | ORAL | Status: DC
  Filled 2018-10-11: qty 1

## 2018-10-11 MED ORDER — PANTOPRAZOLE SODIUM 40 MG IV SOLR
40.0000 mg | Freq: Two times a day (BID) | INTRAVENOUS | Status: DC
  Administered 2018-10-12: 02:00:00 40 mg via INTRAVENOUS
  Filled 2018-10-11: qty 40

## 2018-10-11 MED ORDER — AMIODARONE HCL 200 MG PO TABS
200.0000 mg | ORAL_TABLET | Freq: Every morning | ORAL | Status: DC
  Filled 2018-10-11: qty 1

## 2018-10-11 MED ORDER — MORPHINE SULFATE (PF) 2 MG/ML IV SOLN
2.0000 mg | INTRAVENOUS | Status: DC | PRN
  Administered 2018-10-11 – 2018-10-12 (×2): 2 mg via INTRAVENOUS
  Filled 2018-10-11 (×2): qty 1

## 2018-10-11 MED ORDER — SODIUM CHLORIDE 0.9 % IV BOLUS
500.0000 mL | Freq: Once | INTRAVENOUS | Status: AC
  Administered 2018-10-11: 20:00:00 500 mL via INTRAVENOUS

## 2018-10-11 MED ORDER — METRONIDAZOLE IN NACL 5-0.79 MG/ML-% IV SOLN
500.0000 mg | Freq: Once | INTRAVENOUS | Status: AC
  Administered 2018-10-11: 20:00:00 500 mg via INTRAVENOUS
  Filled 2018-10-11: qty 100

## 2018-10-11 MED ORDER — ACETAMINOPHEN 325 MG PO TABS
650.0000 mg | ORAL_TABLET | Freq: Four times a day (QID) | ORAL | Status: DC | PRN
  Administered 2018-10-11: 650 mg via ORAL
  Filled 2018-10-11: qty 2

## 2018-10-11 MED ORDER — SODIUM CHLORIDE 0.9 % IV SOLN
INTRAVENOUS | Status: DC
  Administered 2018-10-11: 21:00:00 via INTRAVENOUS

## 2018-10-11 MED ORDER — SODIUM CHLORIDE 0.9 % IV SOLN
1000.0000 mL | INTRAVENOUS | Status: DC
  Administered 2018-10-11: 18:00:00 1000 mL via INTRAVENOUS

## 2018-10-11 MED ORDER — POLYETHYLENE GLYCOL 3350 17 G PO PACK: 17.0000 g | PACK | Freq: Every day | ORAL | Status: DC | PRN

## 2018-10-11 MED ORDER — VANCOMYCIN VARIABLE DOSE PER UNSTABLE RENAL FUNCTION (PHARMACIST DOSING): Status: DC

## 2018-10-11 MED ORDER — ACETAMINOPHEN 650 MG RE SUPP: 650.0000 mg | Freq: Four times a day (QID) | RECTAL | Status: DC | PRN

## 2018-10-11 MED ORDER — ALPRAZOLAM 0.25 MG PO TABS: 0.2500 mg | ORAL_TABLET | Freq: Two times a day (BID) | ORAL | Status: DC | PRN

## 2018-10-11 MED ORDER — VANCOMYCIN HCL IN DEXTROSE 1-5 GM/200ML-% IV SOLN
1000.0000 mg | Freq: Once | INTRAVENOUS | Status: AC
  Administered 2018-10-11: 20:00:00 1000 mg via INTRAVENOUS
  Filled 2018-10-11: qty 200

## 2018-10-11 MED ORDER — LEVOTHYROXINE SODIUM 25 MCG PO TABS
25.0000 ug | ORAL_TABLET | Freq: Every day | ORAL | Status: DC
  Filled 2018-10-11: qty 1

## 2018-10-11 MED ORDER — SODIUM CHLORIDE 0.9 % IV SOLN
INTRAVENOUS | Status: DC
  Administered 2018-10-11: via INTRAVENOUS
  Administered 2018-10-12: 18:00:00 1000 mL via INTRAVENOUS
  Administered 2018-10-12: 06:00:00 via INTRAVENOUS

## 2018-10-11 MED ORDER — ATORVASTATIN CALCIUM 40 MG PO TABS
40.0000 mg | ORAL_TABLET | Freq: Every day | ORAL | Status: DC
  Filled 2018-10-11: qty 1

## 2018-10-11 MED ORDER — FLUTICASONE FUROATE-VILANTEROL 200-25 MCG/INH IN AEPB
1.0000 | INHALATION_SPRAY | Freq: Every day | RESPIRATORY_TRACT | Status: DC | PRN
  Filled 2018-10-11: qty 28

## 2018-10-11 MED ORDER — DENOSUMAB 60 MG/ML ~~LOC~~ SOLN: 60.0000 mg | SUBCUTANEOUS | Status: DC

## 2018-10-11 MED ORDER — SODIUM CHLORIDE 0.9 % IV BOLUS (SEPSIS)
500.0000 mL | Freq: Once | INTRAVENOUS | Status: AC
  Administered 2018-10-11: 19:00:00 500 mL via INTRAVENOUS

## 2018-10-11 MED ORDER — SODIUM CHLORIDE 0.9 % IV SOLN
2.0000 g | Freq: Once | INTRAVENOUS | Status: AC
  Administered 2018-10-11: 2 g via INTRAVENOUS
  Filled 2018-10-11: qty 2

## 2018-10-11 MED ORDER — SODIUM CHLORIDE 0.9 % IV BOLUS
1000.0000 mL | Freq: Once | INTRAVENOUS | Status: AC
  Administered 2018-10-11: 1000 mL via INTRAVENOUS

## 2018-10-11 MED ORDER — SODIUM CHLORIDE 0.9 % IV BOLUS (SEPSIS)
1000.0000 mL | Freq: Once | INTRAVENOUS | Status: AC
  Administered 2018-10-11: 18:00:00 1000 mL via INTRAVENOUS

## 2018-10-11 MED ORDER — IPRATROPIUM-ALBUTEROL 0.5-2.5 (3) MG/3ML IN SOLN: 3.0000 mL | Freq: Four times a day (QID) | RESPIRATORY_TRACT | Status: DC | PRN

## 2018-10-11 MED ORDER — OXYCODONE HCL 5 MG PO TABS: 5.0000 mg | ORAL_TABLET | ORAL | Status: DC | PRN

## 2018-10-11 MED ORDER — ONDANSETRON HCL 4 MG/2ML IJ SOLN
4.0000 mg | Freq: Once | INTRAMUSCULAR | Status: AC
  Administered 2018-10-11: 20:00:00 4 mg via INTRAVENOUS
  Filled 2018-10-11: qty 2

## 2018-10-11 MED ORDER — ONDANSETRON HCL 4 MG/2ML IJ SOLN: 4.0000 mg | Freq: Four times a day (QID) | INTRAMUSCULAR | Status: DC | PRN

## 2018-10-11 NOTE — ED Notes (Signed)
Pt still in radiology.

## 2018-10-11 NOTE — ED Triage Notes (Signed)
Pt BIB EMS and presents after a fall at home.  The fall was unwitnessed and daughter came over and found pt lying on the floor beside her recliner.  Pt states that she was on the ground for a few hours. Pt reports neck pain and left knee pain.  EMS did not note any deformities. Pt denies any other complaints to EMS.

## 2018-10-11 NOTE — Progress Notes (Signed)
Pharmacy Antibiotic Note  Holly Weaver is a 85 y.o. female admitted on 09/28/2018 with sepsis.  Pharmacy has been consulted for vanc/cefepime dosing.  Plan: 1) Vanc 1g x 1 given at [place]. Due to est CrCl of only 21 and age of 67, will not order a scheduled dose of vanc at this point. Will order a random vanc level tomorrow AM and will repeat vanc doses based on levels at this time 2) Cefepime 2g x 1 given at [place]. Per current renal function as above, will start 1g IV q24 3) Monitor renal function closely  Height: 5' (152.4 cm) Weight: 90 lb (40.8 kg) IBW/kg (Calculated) : 45.5  Temp (24hrs), Avg:96.6 F (35.9 C), Min:94.4 F (34.7 C), Max:97.5 F (36.4 C)  Recent Labs  Lab 10/06/2018 1705 10/17/2018 1810  WBC 14.0*  --   CREATININE 1.56*  --   LATICACIDVEN  --  5.6*    Estimated Creatinine Clearance: 14.5 mL/min (A) (by C-G formula based on SCr of 1.56 mg/dL (H)).    Allergies  Allergen Reactions  . Codeine Nausea And Vomiting    Thank you for allowing pharmacy to be a part of this patient's care.  Kara Mead 10/14/2018 8:22 PM

## 2018-10-11 NOTE — ED Notes (Signed)
admit Provider at bedside. 

## 2018-10-11 NOTE — ED Notes (Signed)
Dr. Thurnell Garbe aware that we are unable to obtain a O2 saturation.

## 2018-10-11 NOTE — Progress Notes (Signed)
A consult was received from an ED physician for vanc/cefepime per pharmacy dosing.  The patient's profile has been reviewed for ht/wt/allergies/indication/available labs.   A one time order has been placed for vanc 1g and cefepime 2g.  Further antibiotics/pharmacy consults should be ordered by admitting physician if indicated.                       Thank you, Kara Mead 10/14/2018  5:08 PM

## 2018-10-11 NOTE — ED Notes (Signed)
admitting Provider at bedside. 

## 2018-10-11 NOTE — ED Provider Notes (Signed)
[facility] DEPT Provider Note   CSN: [id number] Arrival date & time: 10/18/2018  53     History   Chief Complaint Chief Complaint  Patient presents with   Fall    HPI Holly Weaver is a 85 y.o. female.     HPI  Pt was seen at 1710. Per EMS, pt's family and pt: Pt's family states they were away from the house, and when the came back today, daughter found pt on the floor. Pt told daughter she fell while trying to change an adult diaper and hold onto her walker. Pt may have laid on the ground for several hours. EMS states pt c/o bilat knees pain. Pt does not believe she had syncopal episode. Denies CP/SOB, no cough, no fevers, no abd pain, no N/V/D, no focal motor weakness.   Past Medical History:  Diagnosis Date   Arthritis    "hands and legs" (10/09/2012)   Atrial fibrillation ([place])    Breast cancer ([place]) ~ 2006   "left" (10/09/2012)   Cervical spondylolysis 09/21/2012   Chronic lower back pain    Compression fracture 09/21/2012   COPD (chronic obstructive pulmonary disease) ([place])    Dyslipidemia 09/21/2012   Heart murmur    History of blood transfusion    "years and years ago; I was anemic" (10/09/2012)   Hypertension    MVP (mitral valve prolapse) 09/23/2012   Osteoarthritis 09/21/2012   Osteoporosis    Scoliosis    Shortness of breath    "just now; nose is dry" (10/09/2012)    Patient Active Problem List   Diagnosis Date Noted   Sepsis ([place]) 10/07/2018   COPD with acute exacerbation ([place]) 01/27/2017   HCAP (healthcare-associated pneumonia) 01/27/2017   Chronic diastolic CHF (congestive heart failure) ([place]) 01/27/2017   CKD (chronic kidney disease), stage III ([place]) 01/27/2017   Acute lower UTI 11/26/2016   Falls 11/26/2016   Cellulitis 11/26/2016   Osteopenia 12/30/2015   Atelectasis 08/30/2015   Dyspnea on exertion 04/04/2015   Chronic pulmonary embolism ([place]) 04/04/2015   Acute respiratory failure  with hypoxia ([place]) 09/14/2014   Lobar pneumonia due to unspecified organism 09/14/2014   Protein-calorie malnutrition, severe ([place]) 09/14/2014   Pressure ulcer 09/11/2014   Breast cancer of upper-outer quadrant of left female breast ([place]) 05/26/2013   CAD in native artery, non obstructive by cath 2000 09/23/2012   Dyslipidemia 09/21/2012   Hypertension 09/21/2012   PAF (paroxysmal atrial fibrillation), maintaining SR 05/10/2012   Long term current use of anticoagulant therapy 05/10/2012    Past Surgical History:  Procedure Laterality Date   BREAST BIOPSY Left ~ 2006   CARDIAC CATHETERIZATION  11/1998   CATARACT EXTRACTION W/ INTRAOCULAR LENS IMPLANT Left 2000's   FEMUR IM NAIL Left 09/21/2012   Procedure: INTRAMEDULLARY (IM) NAIL FEMORAL;  Surgeon: Newt Minion, MD;  Location: [facility];  Service: Orthopedics;  Laterality: Left;   HERNIA REPAIR  1950's?     OB History   No obstetric history on file.      Home Medications    Prior to Admission medications   Medication Sig Start Date End Date Taking? Authorizing Provider  ALPRAZolam (XANAX) 0.25 MG tablet Take 1 tablet (0.25 mg total) by mouth 2 (two) times daily as needed for anxiety. Take one tablet by mouth every 12 hours as needed for severe anxiety 01/29/17   Rosita Fire, MD  amiodarone (PACERONE) 200 MG tablet Take 200 mg by mouth every morning.  12/06/11  Alvstad, Kristin L, RPH-CPP  atorvastatin (LIPITOR) 40 MG tablet Take 40 mg daily by mouth.    [provider]  calcium-vitamin D (OSCAL WITH D) 500-200 MG-UNIT per tablet Take 1 tablet by mouth every morning.     [provider]  denosumab (PROLIA) 60 MG/ML SOLN injection Inject 60 mg into the skin every 6 (six) months. Administer in upper arm, thigh, or abdomen    [provider]  ELIQUIS 2.5 MG TABS tablet Take 2.5 mg by mouth 2 (two) times daily.  03/10/13   [provider]  feeding supplement, ENSURE ENLIVE,  (ENSURE ENLIVE) LIQD Take 237 mLs by mouth 2 (two) times daily between meals. 09/15/14   Hongalgi, Lenis Dickinson, MD  fluticasone furoate-vilanterol (BREO ELLIPTA) 200-25 MCG/INH AEPB Inhale 1 puff into the lungs as needed (sob).     [provider]  furosemide (LASIX) 40 MG tablet Take 1 tablet (40 mg total) by mouth daily. 10/14/12   [name], MD  HYDROcodone-acetaminophen (NORCO/VICODIN) 5-325 MG tablet Take 1 tablet by mouth every 6 (six) hours as needed for severe pain. 01/29/17   Rosita Fire, MD  letrozole [facility]) 2.5 MG tablet Take 1 tablet (2.5 mg total) by mouth daily. 08/04/18   Magrinat, Virgie Dad, MD  levothyroxine (SYNTHROID) 25 MCG tablet Take 25 mcg by mouth daily before breakfast. 05/30/18   [provider]  lidocaine (LIDODERM) 5 % Place 1 patch onto the skin. UNWRAP AND APPLY 1 PATCH TO SKIN DAILY FOR 12 HOURS AND 12 HOURS OFF 11/22/16   [provider]  Multiple Vitamin (MULTIVITAMIN WITH MINERALS) TABS Take 1 tablet by mouth every morning. Centrum 50+    [provider]  olmesartan (BENICAR) 20 MG tablet Take 20 mg by mouth daily.    [provider]  ondansetron (ZOFRAN) 4 MG tablet Take 4 mg by mouth every 8 (eight) hours as needed for nausea or vomiting.    [provider]  polyethylene glycol (MIRALAX / GLYCOLAX) packet Take 17 g by mouth daily as needed for mild constipation or moderate constipation.     [provider]  potassium chloride SA (K-DUR,KLOR-CON) 20 MEQ tablet Take 20 mEq by mouth daily.  04/13/13   [provider]  senna (SENOKOT) 8.6 MG tablet Take 1 tablet by mouth daily.    [provider]  traMADol (ULTRAM) 50 MG tablet Take 1 tablet (50 mg total) every 8 (eight) hours as needed by mouth for moderate pain or severe pain. 11/29/16   Hosie Poisson, MD  Vitamin D, Ergocalciferol, (DRISDOL) 50000 UNITS CAPS Take 50,000 Units by mouth every 7 (seven) days.    [provider]     Family History Family History  Problem Relation Age of Onset   Cancer Mother    CAD Father    Cancer Other     Social History Social History   Tobacco Use   Smoking status: Never Smoker   Smokeless tobacco: Never Used  Substance Use Topics   Alcohol use: No   Drug use: No     Allergies   Codeine   Review of Systems Review of Systems ROS: Statement: All systems negative except as marked or noted in the HPI; Constitutional: Negative for fever and chills. ; ; Eyes: Negative for eye pain, redness and discharge. ; ; ENMT: Negative for ear pain, hoarseness, nasal congestion, sinus pressure and sore throat. ; ; Cardiovascular: Negative for chest pain, palpitations, diaphoresis, dyspnea and peripheral edema. ; ;  Respiratory: Negative for cough, wheezing and stridor. ; ; Gastrointestinal: Negative for nausea, vomiting, diarrhea, abdominal pain, blood in stool, hematemesis, jaundice and rectal bleeding. . ; ; Genitourinary: Negative for dysuria, flank pain and hematuria. ; ; Musculoskeletal: Negative for back pain and neck pain. +knees pain..; ; Skin: Negative for pruritus, rash, abrasions, blisters, bruising and skin lesion.; ; Neuro: Negative for headache, lightheadedness and neck stiffness. Negative for weakness, altered level of consciousness, altered mental status, extremity weakness, paresthesias, involuntary movement, seizure and syncope.       Physical Exam Updated Vital Signs BP (!) 101/54    Pulse 93    Temp (!) 94.4 F (34.7 C) (Rectal)    Resp (!) 27    SpO2 (!) 79%    Patient Vitals for the past 24 hrs:  BP Temp Temp src Pulse Resp SpO2  10/18/2018 1900 (!) 101/54 -- -- -- (!) 27 --  09/23/2018 1715 (!) 73/46 -- -- 93 (!) 24 (!) 79 %  10/06/2018 1607 -- (!) 94.4 F (34.7 C) Rectal -- -- --  10/02/2018 1556 (!) 89/48 -- -- 95 (!) 21 --  10/10/2018 1539 -- -- -- -- -- 95 %   Sats 95-98% R/A  Physical Exam 1715: Physical examination:  Nursing notes reviewed; Vital  signs and O2 SAT reviewed;  Constitutional: Thin, frail. In no acute distress; Head:  Normocephalic, atraumatic; Eyes: EOMI, PERRL, No scleral icterus; ENMT: Mouth and pharynx normal, Mucous membranes dry; Neck: Supple, Full range of motion, No lymphadenopathy; Cardiovascular: Irregular rate and rhythm, No gallop; Respiratory: Breath sounds clear & equal bilaterally, No wheezes.  Speaking full sentences with ease, Normal respiratory effort/excursion; Chest: Nontender, Movement normal; Abdomen: Soft, Nontender, Nondistended, Normal bowel sounds; Genitourinary: No CVA tenderness; Spine:  No midline CS, TS, LS tenderness. +C-collar in place.;; Extremities: Peripheral pulses normal, Pelvis stable. +TTP bilat hips < bilat knees. NT bilat ankles/feet. +scattered ecchymosis bilat legs. No deformity. No calf tenderness, edema or asymmetry.; Neuro: AA&Ox3, No facial droop.  Speech clear. Grips equal. Grips equal. +decreased ROM bilat knees d/t pain, otherwise pt moves all extremities spontaneously without apparent gross focal motor deficits in extremities.; Skin: Color cool, Warm, Dry.   ED Treatments / Results  Labs (all labs ordered are listed, but only abnormal results are displayed)   EKG None  Radiology   Procedures Procedures (including critical care time)  Medications Ordered in ED Medications  0.9 %  sodium chloride infusion (1,000 mLs Intravenous New Bag/Given 10/08/2018 1821)  metroNIDAZOLE (FLAGYL) IVPB 500 mg (has no administration in time range)  vancomycin (VANCOCIN) IVPB 1000 mg/200 mL premix (has no administration in time range)  sodium chloride 0.9 % bolus 1,000 mL (0 mLs Intravenous Stopped 10/13/2018 1920)    And  sodium chloride 0.9 % bolus 500 mL (500 mLs Intravenous New Bag/Given 09/29/2018 1921)  ceFEPIme (MAXIPIME) 2 g in sodium chloride 0.9 % 100 mL IVPB (0 g Intravenous Stopped 09/28/2018 1921)     Initial Impression / Assessment and Plan / ED Course  I have reviewed the triage  vital signs and the nursing notes.  Pertinent labs & imaging results that were available during my care of the patient were reviewed by me and considered in my medical decision making (see chart for details).    MDM Reviewed: previous chart, nursing note and vitals Reviewed previous: labs and ECG Interpretation: labs, ECG, x-ray and CT scan Total time providing critical care: 30-74 minutes. This excludes time spent performing separately reportable procedures  and services. Consults: orthopedics and admitting MD   CRITICAL CARE Performed by: Francine Graven Total critical care time: 45 minutes Critical care time was exclusive of separately billable procedures and treating other patients. Critical care was necessary to treat or prevent imminent or life-threatening deterioration. Critical care was time spent personally by me on the following activities: development of treatment plan with patient and/or surrogate as well as nursing, discussions with consultants, evaluation of patient's response to treatment, examination of patient, obtaining history from patient or surrogate, ordering and performing treatments and interventions, ordering and review of laboratory studies, ordering and review of radiographic studies, pulse oximetry and re-evaluation of patient's condition.  ED ECG REPORT   Date: 10/18/2018  Rate: 102  Rhythm: atrial flutter  QRS Axis: normal  Intervals: QT prolonged  ST/T Wave abnormalities: nonspecific ST/T changes  Conduction Disutrbances:none  Narrative Interpretation: baseline wander, artifact  Old EKG Reviewed: changes noted; NS STTW Changes now present compared to EKG dated 01/27/2017. I have personally reviewed the EKG tracing and agree with the computerized printout as noted.   Results for orders placed or performed during the hospital encounter of 10/14/2018  SARS Coronavirus 2 [facility] order, Performed in [facility] hospital lab) Nasopharyngeal Nasopharyngeal  Swab   Specimen: Nasopharyngeal Swab  Result Value Ref Range   SARS Coronavirus 2 NEGATIVE NEGATIVE  Lactic acid, plasma  Result Value Ref Range   Lactic Acid, Venous 5.6 (HH) 0.5 - 1.9 mmol/L  Comprehensive metabolic panel  Result Value Ref Range   Sodium 136 135 - 145 mmol/L   Potassium 3.2 (L) 3.5 - 5.1 mmol/L   Chloride 95 (L) 98 - 111 mmol/L   CO2 22 22 - 32 mmol/L   Glucose, Bld 198 (H) 70 - 99 mg/dL   BUN 22 8 - 23 mg/dL   Creatinine, Ser 1.56 (H) 0.44 - 1.00 mg/dL   Calcium 9.1 8.9 - 10.3 mg/dL   Total Protein 6.4 (L) 6.5 - 8.1 g/dL   Albumin 3.5 3.5 - 5.0 g/dL   AST 43 (H) 15 - 41 U/L   ALT 29 0 - 44 U/L   Alkaline Phosphatase 82 38 - 126 U/L   Total Bilirubin 1.1 0.3 - 1.2 mg/dL   GFR calc non Af Amer 28 (L) >60 mL/min   GFR calc Af Amer 33 (L) >60 mL/min   Anion gap 19 (H) 5 - 15  CBC WITH DIFFERENTIAL  Result Value Ref Range   WBC 14.0 (H) 4.0 - 10.5 K/uL   RBC 3.32 (L) 3.87 - 5.11 MIL/uL   Hemoglobin 11.3 (L) 12.0 - 15.0 g/dL   HCT 35.9 (L) 36.0 - 46.0 %   MCV 108.1 (H) 80.0 - 100.0 fL   MCH 34.0 26.0 - 34.0 pg   MCHC 31.5 30.0 - 36.0 g/dL   RDW 15.9 (H) 11.5 - 15.5 %   Platelets 171 150 - 400 K/uL   nRBC 0.0 0.0 - 0.2 %   Neutrophils Relative % 89 %   Neutro Abs 12.4 (H) 1.7 - 7.7 K/uL   Lymphocytes Relative 6 %   Lymphs Abs 0.8 0.7 - 4.0 K/uL   Monocytes Relative 5 %   Monocytes Absolute 0.7 0.1 - 1.0 K/uL   Eosinophils Relative 0 %   Eosinophils Absolute 0.0 0.0 - 0.5 K/uL   Basophils Relative 0 %   Basophils Absolute 0.0 0.0 - 0.1 K/uL   Immature Granulocytes 0 %   Abs Immature Granulocytes 0.05 0.00 - 0.07 K/uL  APTT  Result Value Ref Range   aPTT 32 24 - 36 seconds  Protime-INR  Result Value Ref Range   Prothrombin Time 24.1 (H) 11.4 - 15.2 seconds   INR 2.2 (H) 0.8 - 1.2  Urinalysis, Routine w reflex microscopic  Result Value Ref Range   Color, Urine YELLOW YELLOW   APPearance CLOUDY (A) CLEAR   Specific Gravity, Urine 1.009 1.005 -  1.030   pH 7.0 5.0 - 8.0   Glucose, UA NEGATIVE NEGATIVE mg/dL   Hgb urine dipstick SMALL (A) NEGATIVE   Bilirubin Urine NEGATIVE NEGATIVE   Ketones, ur NEGATIVE NEGATIVE mg/dL   Protein, ur 30 (A) NEGATIVE mg/dL   Nitrite NEGATIVE NEGATIVE   Leukocytes,Ua LARGE (A) NEGATIVE   RBC / HPF 0-5 0 - 5 RBC/hpf   WBC, UA >50 (H) 0 - 5 WBC/hpf   Bacteria, UA MANY (A) NONE SEEN   WBC Clumps PRESENT    Mucus PRESENT    Hyaline Casts, UA PRESENT   CK  Result Value Ref Range   Total CK 135 38 - 234 U/L  Magnesium  Result Value Ref Range   Magnesium 1.9 1.7 - 2.4 mg/dL  CBG monitoring, ED  Result Value Ref Range   Glucose-Capillary 189 (H) 70 - 99 mg/dL  Troponin I (High Sensitivity)  Result Value Ref Range   Troponin I (High Sensitivity) 22 (H) <18 ng/L   Dg Chest 1 View Result Date: 10/04/2018 CLINICAL DATA:  Knee pain after fall EXAM: CHEST  1 VIEW COMPARISON:  01/27/2017 FINDINGS: Heart remains mildly enlarged. Aortic calcified and tortuous. No focal airspace consolidation, pleural effusion, or pneumothorax. Bones are demineralized. Kyphoplasty changes within the lower thoracic spine. IMPRESSION: No acute cardiopulmonary findings. Electronically Signed   By: Davina Poke M.D.   On: 09/25/2018 18:14   Dg Knee 1-2 Views Left Result Date: 10/21/2018 CLINICAL DATA:  Knee pain after fall EXAM: LEFT KNEE - 1-2 VIEW COMPARISON:  05/07/2017 FINDINGS: There is an acute impacted fracture of the distal left femoral metaphysis with mild posterior displacement and minimal medial displacement. Alignment at the knee joint appears maintained. Advanced tricompartmental degenerative changes of the knee. Bones are severely demineralized. Extensive vascular calcifications. IMPRESSION: Acute mildly displaced fracture of the distal left femoral metaphysis. Electronically Signed   By: Davina Poke M.D.   On: 10/10/2018 18:13   Dg Knee 1-2 Views Right Result Date: 10/06/2018 CLINICAL DATA:  Right knee pain  EXAM: RIGHT KNEE - 1-2 VIEW COMPARISON:  11/26/2016 FINDINGS: Acute impacted fracture of the distal femoral metaphysis with 3-4 cm of foreshortening. Valgus angulation at the fracture site. Tibial plateau appears intact. Severe tricompartment degenerative changes of the knee. Moderate joint effusion. Bones are severely demineralized. Extensive vascular calcifications. IMPRESSION: Acute, impacted and angulated fracture of the distal right femoral metaphysis. Electronically Signed   By: Davina Poke M.D.   On: 10/05/2018 18:11   Ct Head Wo Contrast Result Date: 10/18/2018 CLINICAL DATA:  Fall, found down, pain EXAM: CT HEAD WITHOUT CONTRAST CT CERVICAL SPINE WITHOUT CONTRAST TECHNIQUE: Multidetector CT imaging of the head and cervical spine was performed following the standard protocol without intravenous contrast. Multiplanar CT image reconstructions of the cervical spine were also generated. COMPARISON:  None. FINDINGS: CT HEAD FINDINGS Brain: No evidence of acute infarction, hemorrhage, hydrocephalus, extra-axial collection or mass lesion/mass effect. Global cortical atrophy. Subcortical white matter and periventricular small vessel ischemic changes. Vascular: Intracranial atherosclerosis. Skull: Normal. Negative for fracture or focal lesion. Sinuses/Orbits: Partial opacification  of the bilateral ethmoid sinuses. Other: None. CT CERVICAL SPINE FINDINGS Alignment: Reversal of the normal mid cervical lordosis, chronic/degenerative. Skull base and vertebrae: No acute fracture. No primary bone lesion or focal pathologic process. Soft tissues and spinal canal: No prevertebral fluid or swelling. No visible canal hematoma. Disc levels: Moderate multilevel degenerative changes. Mild anterior wedging at C5 and C6. 4 mm anterolisthesis of C4 on C5. 2 mm retrolisthesis of C5 on C6. Spinal canal is patent. Upper chest: Visualized lung apices are clear. Other: Visualized thyroid is mildly heterogeneous/nodular.  IMPRESSION: No evidence of acute intracranial abnormality. Atrophy with small vessel ischemic changes. No evidence of traumatic injury to the cervical spine. Moderate multilevel degenerative changes. Electronically Signed   By: Julian Hy M.D.   On: 10/02/2018 17:52   Ct Cervical Spine Wo Contrast Result Date: 10/05/2018 CLINICAL DATA:  Fall, found down, pain EXAM: CT HEAD WITHOUT CONTRAST CT CERVICAL SPINE WITHOUT CONTRAST TECHNIQUE: Multidetector CT imaging of the head and cervical spine was performed following the standard protocol without intravenous contrast. Multiplanar CT image reconstructions of the cervical spine were also generated. COMPARISON:  None. FINDINGS: CT HEAD FINDINGS Brain: No evidence of acute infarction, hemorrhage, hydrocephalus, extra-axial collection or mass lesion/mass effect. Global cortical atrophy. Subcortical white matter and periventricular small vessel ischemic changes. Vascular: Intracranial atherosclerosis. Skull: Normal. Negative for fracture or focal lesion. Sinuses/Orbits: Partial opacification of the bilateral ethmoid sinuses. Other: None. CT CERVICAL SPINE FINDINGS Alignment: Reversal of the normal mid cervical lordosis, chronic/degenerative. Skull base and vertebrae: No acute fracture. No primary bone lesion or focal pathologic process. Soft tissues and spinal canal: No prevertebral fluid or swelling. No visible canal hematoma. Disc levels: Moderate multilevel degenerative changes. Mild anterior wedging at C5 and C6. 4 mm anterolisthesis of C4 on C5. 2 mm retrolisthesis of C5 on C6. Spinal canal is patent. Upper chest: Visualized lung apices are clear. Other: Visualized thyroid is mildly heterogeneous/nodular. IMPRESSION: No evidence of acute intracranial abnormality. Atrophy with small vessel ischemic changes. No evidence of traumatic injury to the cervical spine. Moderate multilevel degenerative changes. Electronically Signed   By: Julian Hy M.D.   On:  09/26/2018 17:52   Dg Hips Bilat W Or Wo Pelvis 5 Views Result Date: 10/01/2018 CLINICAL DATA:  Bilateral hip pain after fall EXAM: DG HIP (WITH OR WITHOUT PELVIS) 5+V BILAT COMPARISON:  CT 11/26/2016 FINDINGS: Prior ORIF of the proximal left femur. Unchanged alignment of dynamic hip screw with the lag screw component superior laterally oriented relative to the femoral head. No perihardware lucency or fracture is evident. The hip joints remain intact without fracture or dislocation bones are diffusely demineralized. SI joints and pubic symphysis appear intact. IMPRESSION: 1. No acute osseous abnormality. 2. Unchanged alignment of left hip ORIF hardware. Electronically Signed   By: Davina Poke M.D.   On: 10/02/2018 18:09   KHUSHBOO CHUCK was evaluated in Emergency Department on 10/06/2018 for the symptoms described in the history of present illness. She was evaluated in the context of the global COVID-19 pandemic, which necessitated consideration that the patient might be at risk for infection with the SARS-CoV-2 virus that causes COVID-19. Institutional protocols and algorithms that pertain to the evaluation of patients at risk for COVID-19 are in a state of rapid change based on information released by regulatory bodies including the CDC and federal and state organizations. These policies and algorithms were followed during the patient's care in the ED.   1800:  Code Sepsis called on pt's  arrival. Bair hugger placed. IV NS 30mg /kg bolus given. BC and UC obtained, IV abx ordered. Minimal urine output after foley placed and initial IVF given; UA/UC pending. Covid pending.  1900:  BUN/Cr elevated from baseline; IVF continues. H/H per baseline.  T/C returned from [facility] Dr. Ninfa Linden, case discussed, including:  HPI, pertinent PM/SHx, VS/PE, dx testing, ED course and treatment:  He has viewed the XR images, requests to admit to [facility] to [facility] and Ortho service will consult, place  bilat knee immobilizers, pt will not be having immediate surgery.   1920:  Dx and testing, as well as d/w Ortho MD, d/w pt and family.  Questions answered.  Verb understanding, agreeable to admit to [facility]. T/C returned from [facility] Dr. Laren Everts, case discussed, including:  HPI, pertinent PM/SHx, VS/PE, dx testing, ED course and treatment, as well as d/w Ortho MD:  Agreeable to admit/transfer to [facility].   2100:  COVID negative. +UTI, UC pending; IV abx already given.    Final Clinical Impressions(s) / ED Diagnoses   Final diagnoses:  Fall    ED Discharge Orders    None       Francine Graven, DO 2018/10/16 1816

## 2018-10-11 NOTE — Progress Notes (Signed)
Patient ID: Holly Weaver, female   DOB: December 31, 1923, 85 y.o.   MRN: [id number] I have seen the patient's x-rays.  She has complex bilateral distal femur fractures.  She is on Eliquis as well.  Her bone is quite osteopenic.  She will need surgery on both distal femurs with likely plates and screws.  We would prefer that she be transferred to [facility] due to the extensive nature of her injuries and the fact that surgery will likely not be until Monday or Tuesday in order to maximize her medical status.  I also recommend a CT scan without contrast of both knees.  We will see her in consultation when she is at [facility] and then can come up with a plan for surgery in the near future.  For now, knee immobilizers with ice and elevation can be helpful.

## 2018-10-11 NOTE — Progress Notes (Signed)
Notified bedside nurse of need to draw lactic acid and needs 1362 cc fluids.

## 2018-10-11 NOTE — ED Notes (Signed)
Blood cultures collected prior to starting antibiotics.

## 2018-10-11 NOTE — ED Notes (Signed)
Pt drinking water. Pt daughter remains at the bedside.

## 2018-10-11 NOTE — ED Notes (Signed)
Patient transported to CT 

## 2018-10-11 NOTE — ED Notes (Signed)
Date and time results received: 10/05/2018 1905  (use smartphrase ".now" to insert current time)  Test: Lactic Acid Critical Value: 5.6  Name of Provider Notified: Dr.McManus  Orders Received? Or Actions Taken?:

## 2018-10-11 NOTE — H&P (Addendum)
[facility]                                                                                    Patient Demographics  Holly Weaver, is a 85 y.o. female  CSN: [id number]  MRN: [id number]  DOB - October 07, 1923  Admit Date - 10/10/2018  Outpatient Primary MD for the patient is [name], MD   With History of -  Past Medical History:  Diagnosis Date  . Arthritis    "hands and legs" (10/09/2012)  . Atrial fibrillation ([place])   . Breast cancer ([place]) ~ 2006   "left" (10/09/2012)  . Cervical spondylolysis 09/21/2012  . Chronic lower back pain   . Compression fracture 09/21/2012  . COPD (chronic obstructive pulmonary disease) ([place])   . Dyslipidemia 09/21/2012  . Heart murmur   . History of blood transfusion    "years and years ago; I was anemic" (10/09/2012)  . Hypertension   . MVP (mitral valve prolapse) 09/23/2012  . Osteoarthritis 09/21/2012  . Osteoporosis   . Scoliosis   . Shortness of breath    "just now; nose is dry" (10/09/2012)      Past Surgical History:  Procedure Laterality Date  . BREAST BIOPSY Left ~ 2006  . CARDIAC CATHETERIZATION  11/1998  . CATARACT EXTRACTION W/ INTRAOCULAR LENS IMPLANT Left 2000's  . FEMUR IM NAIL Left 09/21/2012   Procedure: INTRAMEDULLARY (IM) NAIL FEMORAL;  Surgeon: Newt Minion, MD;  Location: [facility];  Service: Orthopedics;  Laterality: Left;  . HERNIA REPAIR  1950's?    in for   Chief Complaint  Patient presents with  . Fall     HPI  Holly Weaver  is a 85 y.o. female, with past medical history significant for A. fib on Eliquis, COPD, history of breast cancer, dyslipidemia and hypertension presenting today status post fall.  She lives with her daughter who is the caretaker who found her on the floor.  Patient is confused about the events.  Denies any preceding chest pains or shortness of breath.  Denies any headaches.  Denies any nausea vomiting or diarrhea.  Reported neck pain and left knee pain. Work-up in the  emergency room initially showed elevated white blood cell count and lactic acid and patient was started on empiric IV antibiotics for sepsis.  Urine was not collected yet.  Chest x-ray looked normal, however her knee x-rays showed lateral distal femoral fractures.  Discussed with Dr. Ninfa Linden from orthopedic surgery who requested she get transferred to [facility].  Patient on Eliquis so no surgical procedure will be done soon.  He advised knee immobilizers bilaterally. Discussed with daughter at bedside who does monitor procedures either at this time.  Her mother is DNR.    Review of Systems    Other than the H NP I could not get significant history.  Patient received pain medications and looked confused   Social History Social History   Tobacco Use  . Smoking status: Never Smoker  . Smokeless tobacco: Never Used  Substance Use Topics  . Alcohol use: No     Family History Family History  Problem Relation Age of Onset  .  Cancer Mother   . CAD Father   . Cancer Other      Prior to Admission medications   Medication Sig Start Date End Date Taking? Authorizing Provider  ALPRAZolam (XANAX) 0.25 MG tablet Take 1 tablet (0.25 mg total) by mouth 2 (two) times daily as needed for anxiety. Take one tablet by mouth every 12 hours as needed for severe anxiety 01/29/17   Rosita Fire, MD  amiodarone (PACERONE) 200 MG tablet Take 200 mg by mouth every morning.  12/06/11   Alvstad, Erasmo Downer L, RPH-CPP  atorvastatin (LIPITOR) 40 MG tablet Take 40 mg daily by mouth.    [provider]  calcium-vitamin D (OSCAL WITH D) 500-200 MG-UNIT per tablet Take 1 tablet by mouth every morning.     [provider]  denosumab (PROLIA) 60 MG/ML SOLN injection Inject 60 mg into the skin every 6 (six) months. Administer in upper arm, thigh, or abdomen    [provider]  ELIQUIS 2.5 MG TABS tablet Take 2.5 mg by mouth 2 (two) times daily.  03/10/13   [provider]  feeding supplement, ENSURE ENLIVE, (ENSURE ENLIVE) LIQD Take 237 mLs by mouth 2 (two) times daily between meals. 09/15/14   Hongalgi, Lenis Dickinson, MD  fluticasone furoate-vilanterol (BREO ELLIPTA) 200-25 MCG/INH AEPB Inhale 1 puff into the lungs as needed (sob).     [provider]  furosemide (LASIX) 40 MG tablet Take 1 tablet (40 mg total) by mouth daily. 10/14/12   [name], MD  HYDROcodone-acetaminophen (NORCO/VICODIN) 5-325 MG tablet Take 1 tablet by mouth every 6 (six) hours as needed for severe pain. 01/29/17   Rosita Fire, MD  letrozole [employer] 2.5 MG tablet Take 1 tablet (2.5 mg total) by mouth daily. 08/04/18   Magrinat, Virgie Dad, MD  levothyroxine (SYNTHROID) 25 MCG tablet Take 25 mcg by mouth daily before breakfast. 05/30/18   [provider]  lidocaine (LIDODERM) 5 % Place 1 patch onto the skin. UNWRAP AND APPLY 1 PATCH TO SKIN DAILY FOR 12 HOURS AND 12 HOURS OFF 11/22/16   [provider]  Multiple Vitamin (MULTIVITAMIN WITH MINERALS) TABS Take 1 tablet by mouth every morning. Centrum 50+    [provider]  olmesartan (BENICAR) 20 MG tablet Take 20 mg by mouth daily.    [provider]  ondansetron (ZOFRAN) 4 MG tablet Take 4 mg by mouth every 8 (eight) hours as needed for nausea or vomiting.    [provider]  polyethylene glycol (MIRALAX / GLYCOLAX) packet Take 17 g by mouth daily as needed for mild constipation or moderate constipation.     [provider]  potassium chloride SA (K-DUR,KLOR-CON) 20 MEQ tablet Take 20 mEq by mouth daily.  04/13/13   [provider]  senna (SENOKOT) 8.6 MG tablet Take 1 tablet by mouth daily.    [provider]  traMADol (ULTRAM) 50 MG tablet Take 1 tablet (50 mg total) every 8 (eight) hours as needed by mouth for moderate pain or severe pain. 11/29/16   Hosie Poisson, MD  Vitamin D, Ergocalciferol, (DRISDOL) 50000 UNITS CAPS Take 50,000 Units by mouth every 7  (seven) days.    [provider]    Allergies  Allergen Reactions  . Codeine Nausea And Vomiting    Physical Exam  Vitals  Blood pressure (!) 101/54, pulse 93, temperature (!) 94.4 F (34.7 C), temperature source Rectal, resp. rate (!) 27, SpO2 (!) 79 %.   GA acutely  and chronically ill, confused.  Well-developed HEENT no jaundice positive pallor Neck supple, no neck vein distention noted Chest clear, mild scattered rhonchi Heart irregular irregular positive systolic murmur Abdomen soft, nontender, bowel sounds are present Extremities knee immobilizer is noted with multiple areas of ecchymosis on the lower extremities Skin significant for generalized ecchymosis, scattered Neuro could not be assessed due to patient's condition, patient is drowsy but arousable Psych could not be assessed due to patient's condition  Data Review  CBC Recent Labs  Lab 10/15/2018 1705  WBC 14.0*  HGB 11.3*  HCT 35.9*  PLT 171  MCV 108.1*  MCH 34.0  MCHC 31.5  RDW 15.9*  LYMPHSABS 0.8  MONOABS 0.7  EOSABS 0.0  BASOSABS 0.0   ------------------------------------------------------------------------------------------------------------------  Chemistries  Recent Labs  Lab 10/15/2018 1705  NA 136  K 3.2*  CL 95*  CO2 22  GLUCOSE 198*  BUN 22  CREATININE 1.56*  CALCIUM 9.1  AST 43*  ALT 29  ALKPHOS 82  BILITOT 1.1   ------------------------------------------------------------------------------------------------------------------ CrCl cannot be calculated (Unknown ideal weight.). ------------------------------------------------------------------------------------------------------------------ No results for input(s): TSH, T4TOTAL, T3FREE, THYROIDAB in the last 72 hours.  Invalid input(s): FREET3   Coagulation profile Recent Labs  Lab 09/28/2018 1705  INR 2.2*    ------------------------------------------------------------------------------------------------------------------- No results for input(s): DDIMER in the last 72 hours. -------------------------------------------------------------------------------------------------------------------  Cardiac Enzymes No results for input(s): CKMB, TROPONINI, MYOGLOBIN in the last 168 hours.  Invalid input(s): CK ------------------------------------------------------------------------------------------------------------------ Invalid input(s): POCBNP   ---------------------------------------------------------------------------------------------------------------  Urinalysis    Component Value Date/Time   COLORURINE YELLOW 11/28/2016 2045   APPEARANCEUR HAZY (A) 11/28/2016 2045   LABSPEC 1.011 11/28/2016 2045   PHURINE 7.0 11/28/2016 2045   GLUCOSEU NEGATIVE 11/28/2016 2045   HGBUR SMALL (A) 11/28/2016 2045   BILIRUBINUR NEGATIVE 11/28/2016 2045   KETONESUR NEGATIVE 11/28/2016 2045   PROTEINUR NEGATIVE 11/28/2016 2045   UROBILINOGEN 1.0 09/11/2014 0111   NITRITE NEGATIVE 11/28/2016 2045   LEUKOCYTESUR LARGE (A) 11/28/2016 2045    ----------------------------------------------------------------------------------------------------------------   Imaging results:   Dg Chest 1 View  Result Date: 10/01/2018 CLINICAL DATA:  Knee pain after fall EXAM: CHEST  1 VIEW COMPARISON:  01/27/2017 FINDINGS: Heart remains mildly enlarged. Aortic calcified and tortuous. No focal airspace consolidation, pleural effusion, or pneumothorax. Bones are demineralized. Kyphoplasty changes within the lower thoracic spine. IMPRESSION: No acute cardiopulmonary findings. Electronically Signed   By: Davina Poke M.D.   On: 09/28/2018 18:14   Dg Knee 1-2 Views Left  Result Date: 10/15/2018 CLINICAL DATA:  Knee pain after fall EXAM: LEFT KNEE - 1-2 VIEW COMPARISON:  05/07/2017 FINDINGS: There is an acute impacted  fracture of the distal left femoral metaphysis with mild posterior displacement and minimal medial displacement. Alignment at the knee joint appears maintained. Advanced tricompartmental degenerative changes of the knee. Bones are severely demineralized. Extensive vascular calcifications. IMPRESSION: Acute mildly displaced fracture of the distal left femoral metaphysis. Electronically Signed   By: Davina Poke M.D.   On: 10/10/2018 18:13   Dg Knee 1-2 Views Right  Result Date: 10/11/2018 CLINICAL DATA:  Right knee pain EXAM: RIGHT KNEE - 1-2 VIEW COMPARISON:  11/26/2016 FINDINGS: Acute impacted fracture of the distal femoral metaphysis with 3-4 cm of foreshortening. Valgus angulation at the fracture site. Tibial plateau appears intact. Severe tricompartment degenerative changes of the knee. Moderate joint effusion. Bones are severely demineralized. Extensive vascular calcifications. IMPRESSION: Acute, impacted and angulated fracture of the distal right femoral metaphysis. Electronically Signed   By: Davina Poke  M.D.   On: 10/16/2018 18:11   Ct Head Wo Contrast  Result Date: 10/05/2018 CLINICAL DATA:  Fall, found down, pain EXAM: CT HEAD WITHOUT CONTRAST CT CERVICAL SPINE WITHOUT CONTRAST TECHNIQUE: Multidetector CT imaging of the head and cervical spine was performed following the standard protocol without intravenous contrast. Multiplanar CT image reconstructions of the cervical spine were also generated. COMPARISON:  None. FINDINGS: CT HEAD FINDINGS Brain: No evidence of acute infarction, hemorrhage, hydrocephalus, extra-axial collection or mass lesion/mass effect. Global cortical atrophy. Subcortical white matter and periventricular small vessel ischemic changes. Vascular: Intracranial atherosclerosis. Skull: Normal. Negative for fracture or focal lesion. Sinuses/Orbits: Partial opacification of the bilateral ethmoid sinuses. Other: None. CT CERVICAL SPINE FINDINGS Alignment: Reversal of the  normal mid cervical lordosis, chronic/degenerative. Skull base and vertebrae: No acute fracture. No primary bone lesion or focal pathologic process. Soft tissues and spinal canal: No prevertebral fluid or swelling. No visible canal hematoma. Disc levels: Moderate multilevel degenerative changes. Mild anterior wedging at C5 and C6. 4 mm anterolisthesis of C4 on C5. 2 mm retrolisthesis of C5 on C6. Spinal canal is patent. Upper chest: Visualized lung apices are clear. Other: Visualized thyroid is mildly heterogeneous/nodular. IMPRESSION: No evidence of acute intracranial abnormality. Atrophy with small vessel ischemic changes. No evidence of traumatic injury to the cervical spine. Moderate multilevel degenerative changes. Electronically Signed   By: Julian Hy M.D.   On: 10/10/2018 17:52   Ct Cervical Spine Wo Contrast  Result Date: 10/15/2018 CLINICAL DATA:  Fall, found down, pain EXAM: CT HEAD WITHOUT CONTRAST CT CERVICAL SPINE WITHOUT CONTRAST TECHNIQUE: Multidetector CT imaging of the head and cervical spine was performed following the standard protocol without intravenous contrast. Multiplanar CT image reconstructions of the cervical spine were also generated. COMPARISON:  None. FINDINGS: CT HEAD FINDINGS Brain: No evidence of acute infarction, hemorrhage, hydrocephalus, extra-axial collection or mass lesion/mass effect. Global cortical atrophy. Subcortical white matter and periventricular small vessel ischemic changes. Vascular: Intracranial atherosclerosis. Skull: Normal. Negative for fracture or focal lesion. Sinuses/Orbits: Partial opacification of the bilateral ethmoid sinuses. Other: None. CT CERVICAL SPINE FINDINGS Alignment: Reversal of the normal mid cervical lordosis, chronic/degenerative. Skull base and vertebrae: No acute fracture. No primary bone lesion or focal pathologic process. Soft tissues and spinal canal: No prevertebral fluid or swelling. No visible canal hematoma. Disc levels:  Moderate multilevel degenerative changes. Mild anterior wedging at C5 and C6. 4 mm anterolisthesis of C4 on C5. 2 mm retrolisthesis of C5 on C6. Spinal canal is patent. Upper chest: Visualized lung apices are clear. Other: Visualized thyroid is mildly heterogeneous/nodular. IMPRESSION: No evidence of acute intracranial abnormality. Atrophy with small vessel ischemic changes. No evidence of traumatic injury to the cervical spine. Moderate multilevel degenerative changes. Electronically Signed   By: Julian Hy M.D.   On: 10/18/2018 17:52   Dg Hips Bilat W Or Wo Pelvis 5 Views  Result Date: 10/10/2018 CLINICAL DATA:  Bilateral hip pain after fall EXAM: DG HIP (WITH OR WITHOUT PELVIS) 5+V BILAT COMPARISON:  CT 11/26/2016 FINDINGS: Prior ORIF of the proximal left femur. Unchanged alignment of dynamic hip screw with the lag screw component superior laterally oriented relative to the femoral head. No perihardware lucency or fracture is evident. The hip joints remain intact without fracture or dislocation bones are diffusely demineralized. SI joints and pubic symphysis appear intact. IMPRESSION: 1. No acute osseous abnormality. 2. Unchanged alignment of left hip ORIF hardware. Electronically Signed   By: Davina Poke M.D.   On: 10/10/2018  18:09      Assessment & Plan  Sepsis, unknown source , presented with a systolic blood pressure of 89 and hypothermia Urinalysis is still pending Chest x-ray is negative Start IV vancomycin and cefepime empirically IV fluids  Bilateral distal femoral fractures Discussed with Dr. Ninfa Linden by ER physician Advised knee immobilizers bilaterally Place Eliquis on hold  COPD Continue with nebulizer treatments  Hypertension Hold Benicar and other blood pressure medicines for hypotension  Acute kidney injury IV fluids Hold Benicar, Lasix  Hyperlipidemia continue with Lipitor  A. Fib Continue with amiodarone, hold Eliquis    DVT Prophylaxis  Eliquis  AM Labs Ordered, also please review Full Orders  Family Communication: Discussed with family at bedside.  Code Status DNR  Disposition Plan: To be determined  Time spent in minutes : 46 minutes  Condition critical  Patient's blood pressure has been dropping.  ICU was paged for consult.  Discussed with daughter and she wants her to be on dopamine although she is DNR ,if needed. Blood pressure is improving at this time. Urine analysis came back positive , pointing towards urosepsis.  We will continue with IV antibiotics fluid boluses for now . Discussed with ICU covering physician.  We will continue hydration and observe for now. The idea of pressors in this unfortunate lady may not be the best decision, in the light that her family wants her DNR and they were against surgery. Patient still mentating good and her pulse ox is around 97%. 12:10 AM called daughter again.  I explained to her the dangers of pressors and placement of a central line.  She agreed with hydration only and to hold off on transferring her to ICU. I think that this is the appropriate decision and will treat with IV fluids at this time and monitor patient.    @SIGNATURE @

## 2018-10-12 ENCOUNTER — Inpatient Hospital Stay (HOSPITAL_COMMUNITY): Payer: Medicare Other

## 2018-10-12 DIAGNOSIS — R52 Pain, unspecified: Secondary | ICD-10-CM

## 2018-10-12 DIAGNOSIS — Z515 Encounter for palliative care: Secondary | ICD-10-CM

## 2018-10-12 DIAGNOSIS — S72432G Displaced fracture of medial condyle of left femur, subsequent encounter for closed fracture with delayed healing: Secondary | ICD-10-CM

## 2018-10-12 DIAGNOSIS — S72009A Fracture of unspecified part of neck of unspecified femur, initial encounter for closed fracture: Secondary | ICD-10-CM

## 2018-10-12 DIAGNOSIS — I959 Hypotension, unspecified: Secondary | ICD-10-CM

## 2018-10-12 DIAGNOSIS — I340 Nonrheumatic mitral (valve) insufficiency: Secondary | ICD-10-CM

## 2018-10-12 DIAGNOSIS — S72422G Displaced fracture of lateral condyle of left femur, subsequent encounter for closed fracture with delayed healing: Secondary | ICD-10-CM

## 2018-10-12 DIAGNOSIS — Z7189 Other specified counseling: Secondary | ICD-10-CM

## 2018-10-12 DIAGNOSIS — I361 Nonrheumatic tricuspid (valve) insufficiency: Secondary | ICD-10-CM

## 2018-10-12 DIAGNOSIS — N390 Urinary tract infection, site not specified: Secondary | ICD-10-CM

## 2018-10-12 LAB — BASIC METABOLIC PANEL
Anion gap: 10 (ref 5–15)
BUN: 25 mg/dL — ABNORMAL HIGH (ref 8–23)
CO2: 19 mmol/L — ABNORMAL LOW (ref 22–32)
Calcium: 7.2 mg/dL — ABNORMAL LOW (ref 8.9–10.3)
Chloride: 108 mmol/L (ref 98–111)
Creatinine, Ser: 1.32 mg/dL — ABNORMAL HIGH (ref 0.44–1.00)
GFR calc Af Amer: 40 mL/min — ABNORMAL LOW (ref >60)
GFR calc non Af Amer: 35 mL/min — ABNORMAL LOW (ref >60)
Glucose, Bld: 129 mg/dL — ABNORMAL HIGH (ref 70–99)
Potassium: 4 mmol/L (ref 3.5–5.1)
Sodium: 137 mmol/L (ref 135–145)

## 2018-10-12 LAB — VANCOMYCIN, RANDOM: Vancomycin Rm: 14

## 2018-10-12 LAB — CBC
HCT: 23.2 % — ABNORMAL LOW (ref 36.0–46.0)
Hemoglobin: 7.2 g/dL — ABNORMAL LOW (ref 12.0–15.0)
MCH: 33.3 pg (ref 26.0–34.0)
MCHC: 31 g/dL (ref 30.0–36.0)
MCV: 107.4 fL — ABNORMAL HIGH (ref 80.0–100.0)
Platelets: 109 10*3/uL — ABNORMAL LOW (ref 150–400)
RBC: 2.16 MIL/uL — ABNORMAL LOW (ref 3.87–5.11)
RDW: 15.8 % — ABNORMAL HIGH (ref 11.5–15.5)
WBC: 8.3 10*3/uL (ref 4.0–10.5)
nRBC: 0 % (ref 0.0–0.2)

## 2018-10-12 LAB — CORTISOL: Cortisol, Plasma: 33.1 ug/dL

## 2018-10-12 LAB — HEMOGLOBIN AND HEMATOCRIT, BLOOD
HCT: 36.8 % (ref 36.0–46.0)
Hemoglobin: 11.7 g/dL — ABNORMAL LOW (ref 12.0–15.0)

## 2018-10-12 LAB — ECHOCARDIOGRAM COMPLETE
Height: 60 in
Weight: 1440 oz

## 2018-10-12 LAB — PREPARE RBC (CROSSMATCH)

## 2018-10-12 MED ORDER — HYDROMORPHONE HCL 1 MG/ML IJ SOLN
0.5000 mg | INTRAMUSCULAR | Status: DC | PRN
  Filled 2018-10-12: qty 1

## 2018-10-12 MED ORDER — SODIUM CHLORIDE 0.9 % IV BOLUS
1000.0000 mL | Freq: Once | INTRAVENOUS | Status: AC
  Administered 2018-10-12: 05:00:00 1000 mL via INTRAVENOUS

## 2018-10-12 MED ORDER — DIPHENHYDRAMINE HCL 50 MG/ML IJ SOLN
25.0000 mg | Freq: Once | INTRAMUSCULAR | Status: AC
  Administered 2018-10-12: 06:00:00 25 mg via INTRAVENOUS

## 2018-10-12 MED ORDER — DIPHENHYDRAMINE HCL 50 MG/ML IJ SOLN
INTRAMUSCULAR | Status: AC
  Filled 2018-10-12: qty 1

## 2018-10-12 MED ORDER — SODIUM CHLORIDE 0.9 % IV BOLUS
1000.0000 mL | Freq: Once | INTRAVENOUS | Status: AC
  Administered 2018-10-12: 02:00:00 1000 mL via INTRAVENOUS

## 2018-10-12 MED ORDER — SODIUM CHLORIDE 0.9% IV SOLUTION
Freq: Once | INTRAVENOUS | Status: AC
  Administered 2018-10-12: 06:00:00 via INTRAVENOUS

## 2018-10-12 MED ORDER — VANCOMYCIN HCL IN DEXTROSE 1-5 GM/200ML-% IV SOLN
1000.0000 mg | Freq: Once | INTRAVENOUS | Status: AC
  Administered 2018-10-12: 10:00:00 1000 mg via INTRAVENOUS
  Filled 2018-10-12: qty 200

## 2018-10-12 MED ORDER — PHENYLEPHRINE HCL-NACL 10-0.9 MG/250ML-% IV SOLN
0.0000 ug/min | INTRAVENOUS | Status: DC
  Administered 2018-10-12: 06:00:00 20 ug/min via INTRAVENOUS
  Filled 2018-10-12 (×2): qty 250

## 2018-10-12 MED ORDER — TRAMADOL HCL 50 MG PO TABS
50.0000 mg | ORAL_TABLET | Freq: Once | ORAL | Status: AC
  Administered 2018-10-12: 05:00:00 50 mg via ORAL
  Filled 2018-10-12: qty 1

## 2018-10-13 LAB — TYPE AND SCREEN
ABO/RH(D): A POS
Antibody Screen: NEGATIVE
Unit division: 0
Unit division: 0

## 2018-10-13 LAB — BPAM RBC
Blood Product Expiration Date: 202010172359
Blood Product Expiration Date: 202010172359
ISSUE DATE / TIME: 202009200606
ISSUE DATE / TIME: 202009200910
Unit Type and Rh: 6200
Unit Type and Rh: 6200

## 2018-10-13 NOTE — Discharge Summary (Signed)
Physician Discharge Summary  [name]:[id number] DOB: 10-Jul-1923 DOA: 10/14/2018  PCP: [name], MD  Admit date: 09/25/2018 Discharge date: 11/12/18  1. Admitted From: Home   Patient expired on 11-11-18 at 10 PM      Discharge Diagnoses:  Principal Problem: severe sepsis with septic shock  Active Problems: Urinary tract infection Traumatic bilateral femur fracture Severe protein calorie malnutrition Paroxysmal A. Fib Acute kidney injury Hypokalemia    Brief Narrative   85 year old female with history of A. fib on Eliquis, COPD, dyslipidemia, hypertension and history of breast cancer presented with fall.  Her daughter is her caretaker who found patient on the floor.  Patient does not remember the events.  In the ED she was found to have leukocytosis and elevated lactic acid and started on empiric antibiotic with concern for sepsis.  Imaging done in the ED showed bilateral wrist femoral fractures.  Patient became hypotensive and was started on pressors.  Orthopedic surgery consulted who recommended transfer to [facility] for surgical repair. Patient became hypotensive and was started on pressors in the ED.  Given her advanced age, comorbidities and high risk for surgery PCCM and palliative care discussed with patient's daughter who refused for surgery, wanted to keep patient comfortable off pressors and possibly home with hospice.   Hospital course  Principal problem  Severe sepsis with septic shock, possibly secondary to UTI Patient hypotensive and hypothermic in the ED.  Started on pressors and empiric IV vancomycin and cefepime.  Chest x-ray negative for infection.  UA suggestive of UTI.  Culture sent.    After discussion with daughter pressors discontinued given her advanced age, comorbidities and poor functional status. She wished to continue antibiotics, fluids and blood transfusion as needed and treat the treatable.    She refused patient to  have surgery since patient is usually bedbound and will likely have an eventful perioperative course. Patient made DNR and admitted to medical floor. Palliative care consult appreciated.  Pain control given with low-dose PRN Dilaudid.  Plan for discharging patient with home hospice once she was clinically stable. During the night on 9/20 patient was found nonresponsive in her room by the nurse with dilated pupil absent heart rate and breathing.  Patient was pronounced dead at 10 PM on Nov 12, 2022.  Family informed.   Acute kidney injury Prerenal secondary to hypovolemia and sepsis.  Blood pressure meds held and given IV fluids.  Hypokalemia Replenished   Discharge Instructions   Allergies  Allergen Reactions  . Codeine Nausea And Vomiting       Procedures/Studies: Ct Abdomen Pelvis Wo Contrast  Result Date: 11/11/18 CLINICAL DATA:  Abdominal pain. Clinical suspicion for pyelonephritis. EXAM: CT ABDOMEN AND PELVIS WITHOUT CONTRAST TECHNIQUE: Multidetector CT imaging of the abdomen and pelvis was performed following the standard protocol without IV contrast. COMPARISON:  11/26/2016 FINDINGS: Lower chest: Heart mildly enlarged. Small pericardial effusion. Trace pleural effusions. Dependent lung base opacity most likely atelectasis. Hepatobiliary: Liver relatively small increased attenuation diffusely. No liver mass or focal lesion. Gallbladder unremarkable. No bile duct dilation. Pancreas: Partial fatty replacement. No mass or convincing inflammation. Spleen: Choose 1 Adrenals/Urinary Tract: No adrenal masses. Right renal atrophy. No renal masses stones or hydronephrosis. No perinephric fluid collections. Ureters incompletely visualized, but appear normal in caliber and course with convincing stones. Bladder is decompressed with a Foley catheter. Stomach/Bowel: Moderate hiatal hernia. Stomach otherwise unremarkable. Small bowel and colon are normal caliber. No convincing wall thickening or  inflammation. Vascular/Lymphatic: Diffuse aortic  atherosclerotic calcifications extending into the vessels. No aneurysm. No discrete enlarged lymph node. Reproductive: Uterus and bilateral adnexa are unremarkable. Other: Trace amount of ascites. Is generalized mesenteric vascular congestion increased attenuation in the mesenteric subcutaneous fat consistent diffuse edema. Musculoskeletal: Compression fractures of T11-T12 previously treated with vertebroplasty. Mild depression of the upper endplate L1 previous ORIF a left intertrochanteric hip fracture. No acute fractures. No osteoblastic or osteolytic lesions. No change from the prior CT. IMPRESSION: 1. No evidence of hydronephrosis or of a perinephric abscess. 2. Small pericardial effusion, trace pleural effusions and trace amount of ascites as well as soft tissue edema. Findings may be due to renal failure, cardiac decompensation or a combination. 3. Increased attenuation of the liver. This finding can be seen with iron deposition from transfusions. Hemochromatosis, hemosiderosis, Wilson's disease and long term amiodarone hepatotoxicity are in the differential differential diagnosis for a hyperattenuating liver. Electronically Signed   By: Lajean Manes M.D.   On: 10/18/2018 05:33   Dg Chest 1 View  Result Date: 09/27/2018 CLINICAL DATA:  Knee pain after fall EXAM: CHEST  1 VIEW COMPARISON:  01/27/2017 FINDINGS: Heart remains mildly enlarged. Aortic calcified and tortuous. No focal airspace consolidation, pleural effusion, or pneumothorax. Bones are demineralized. Kyphoplasty changes within the lower thoracic spine. IMPRESSION: No acute cardiopulmonary findings. Electronically Signed   By: Davina Poke M.D.   On: 10/19/2018 18:14   Dg Knee 1-2 Views Left  Result Date: 09/29/2018 CLINICAL DATA:  Knee pain after fall EXAM: LEFT KNEE - 1-2 VIEW COMPARISON:  05/07/2017 FINDINGS: There is an acute impacted fracture of the distal left femoral metaphysis with  mild posterior displacement and minimal medial displacement. Alignment at the knee joint appears maintained. Advanced tricompartmental degenerative changes of the knee. Bones are severely demineralized. Extensive vascular calcifications. IMPRESSION: Acute mildly displaced fracture of the distal left femoral metaphysis. Electronically Signed   By: Davina Poke M.D.   On: 09/23/2018 18:13   Dg Knee 1-2 Views Right  Result Date: 10/20/2018 CLINICAL DATA:  Right knee pain EXAM: RIGHT KNEE - 1-2 VIEW COMPARISON:  11/26/2016 FINDINGS: Acute impacted fracture of the distal femoral metaphysis with 3-4 cm of foreshortening. Valgus angulation at the fracture site. Tibial plateau appears intact. Severe tricompartment degenerative changes of the knee. Moderate joint effusion. Bones are severely demineralized. Extensive vascular calcifications. IMPRESSION: Acute, impacted and angulated fracture of the distal right femoral metaphysis. Electronically Signed   By: Davina Poke M.D.   On: 10/04/2018 18:11   Ct Head Wo Contrast  Result Date: 10/05/2018 CLINICAL DATA:  Fall, found down, pain EXAM: CT HEAD WITHOUT CONTRAST CT CERVICAL SPINE WITHOUT CONTRAST TECHNIQUE: Multidetector CT imaging of the head and cervical spine was performed following the standard protocol without intravenous contrast. Multiplanar CT image reconstructions of the cervical spine were also generated. COMPARISON:  None. FINDINGS: CT HEAD FINDINGS Brain: No evidence of acute infarction, hemorrhage, hydrocephalus, extra-axial collection or mass lesion/mass effect. Global cortical atrophy. Subcortical white matter and periventricular small vessel ischemic changes. Vascular: Intracranial atherosclerosis. Skull: Normal. Negative for fracture or focal lesion. Sinuses/Orbits: Partial opacification of the bilateral ethmoid sinuses. Other: None. CT CERVICAL SPINE FINDINGS Alignment: Reversal of the normal mid cervical lordosis, chronic/degenerative.  Skull base and vertebrae: No acute fracture. No primary bone lesion or focal pathologic process. Soft tissues and spinal canal: No prevertebral fluid or swelling. No visible canal hematoma. Disc levels: Moderate multilevel degenerative changes. Mild anterior wedging at C5 and C6. 4 mm anterolisthesis of C4 on C5.  2 mm retrolisthesis of C5 on C6. Spinal canal is patent. Upper chest: Visualized lung apices are clear. Other: Visualized thyroid is mildly heterogeneous/nodular. IMPRESSION: No evidence of acute intracranial abnormality. Atrophy with small vessel ischemic changes. No evidence of traumatic injury to the cervical spine. Moderate multilevel degenerative changes. Electronically Signed   By: Julian Hy M.D.   On: 09/26/2018 17:52   Ct Cervical Spine Wo Contrast  Result Date: 10/02/2018 CLINICAL DATA:  Fall, found down, pain EXAM: CT HEAD WITHOUT CONTRAST CT CERVICAL SPINE WITHOUT CONTRAST TECHNIQUE: Multidetector CT imaging of the head and cervical spine was performed following the standard protocol without intravenous contrast. Multiplanar CT image reconstructions of the cervical spine were also generated. COMPARISON:  None. FINDINGS: CT HEAD FINDINGS Brain: No evidence of acute infarction, hemorrhage, hydrocephalus, extra-axial collection or mass lesion/mass effect. Global cortical atrophy. Subcortical white matter and periventricular small vessel ischemic changes. Vascular: Intracranial atherosclerosis. Skull: Normal. Negative for fracture or focal lesion. Sinuses/Orbits: Partial opacification of the bilateral ethmoid sinuses. Other: None. CT CERVICAL SPINE FINDINGS Alignment: Reversal of the normal mid cervical lordosis, chronic/degenerative. Skull base and vertebrae: No acute fracture. No primary bone lesion or focal pathologic process. Soft tissues and spinal canal: No prevertebral fluid or swelling. No visible canal hematoma. Disc levels: Moderate multilevel degenerative changes. Mild anterior  wedging at C5 and C6. 4 mm anterolisthesis of C4 on C5. 2 mm retrolisthesis of C5 on C6. Spinal canal is patent. Upper chest: Visualized lung apices are clear. Other: Visualized thyroid is mildly heterogeneous/nodular. IMPRESSION: No evidence of acute intracranial abnormality. Atrophy with small vessel ischemic changes. No evidence of traumatic injury to the cervical spine. Moderate multilevel degenerative changes. Electronically Signed   By: Julian Hy M.D.   On: 09/29/2018 17:52   Dg Hips Bilat W Or Wo Pelvis 5 Views  Result Date: 10/21/2018 CLINICAL DATA:  Bilateral hip pain after fall EXAM: DG HIP (WITH OR WITHOUT PELVIS) 5+V BILAT COMPARISON:  CT 11/26/2016 FINDINGS: Prior ORIF of the proximal left femur. Unchanged alignment of dynamic hip screw with the lag screw component superior laterally oriented relative to the femoral head. No perihardware lucency or fracture is evident. The hip joints remain intact without fracture or dislocation bones are diffusely demineralized. SI joints and pubic symphysis appear intact. IMPRESSION: 1. No acute osseous abnormality. 2. Unchanged alignment of left hip ORIF hardware. Electronically Signed   By: Davina Poke M.D.   On: 10/02/2018 18:09      Discharge Exam: Vitals:   11/07/2018 1300 11-07-2018 1412  BP: (!) 76/44 (!) 84/30  Pulse: 95 80  Resp: 20 18  Temp: 98.8 F (37.1 C) 98.1 F (36.7 C)  SpO2: 92% 94%   Vitals:   11-07-2018 1216 11/07/2018 1300 11/07/18 1412 Nov 07, 2018 2343  BP: (!) 84/38 (!) 76/44 (!) 84/30   Pulse: 83 95 80   Resp: 20 20 18    Temp: 99 F (37.2 C) 98.8 F (37.1 C) 98.1 F (36.7 C)   TempSrc:   Axillary   SpO2: 94% 92% 94%   Weight:    40.8 kg  Height:    5' (1.524 m)     The results of significant diagnostics from this hospitalization (including imaging, microbiology, ancillary and laboratory) are listed below for reference.     Microbiology: Recent Results (from the past 240 hour(s))  Blood Culture (routine  x 2)     Status: None (Preliminary result)   Collection Time: 10/11/18  6:10 PM   Specimen:  BLOOD  Result Value Ref Range Status   Specimen Description   Final    BLOOD RIGHT ANTECUBITAL Performed at [facility] [address], [place], [place] [address]    Special Requests   Final    BOTTLES DRAWN AEROBIC AND ANAEROBIC Blood Culture adequate volume Performed at [facility] [address], [place], [place] [address]    Culture   Final    NO GROWTH 2 DAYS Performed at [facility] [address] [place], [place] [address]    Report Status PENDING  Incomplete  Blood Culture (routine x 2)     Status: None (Preliminary result)   Collection Time: 10/19/2018  6:19 PM   Specimen: BLOOD  Result Value Ref Range Status   Specimen Description   Final    BLOOD RIGHT ARM Performed at [facility] [address], [place], [place] [address]    Special Requests   Final    BOTTLES DRAWN AEROBIC ONLY Blood Culture results may not be optimal due to an inadequate volume of blood received in culture bottles Performed at [facility] [address] [place], [place] [address]    Culture   Final    NO GROWTH 2 DAYS Performed at [facility] [address], [place], [place] [address]    Report Status PENDING  Incomplete  Urine culture     Status: Abnormal (Preliminary result)   Collection Time: 10/18/2018  7:44 PM   Specimen: In/Out Cath Urine  Result Value Ref Range Status   Specimen Description   Final    IN/OUT CATH URINE Performed at [facility] [address], [place], [place] [address]    Special Requests   Final    NONE Performed at [facility], [place] [address], [place], [place] [address]    Culture (A)  Final    50,000 COLONIES/mL KLEBSIELLA OXYTOCA SUSCEPTIBILITIES TO FOLLOW Performed at [facility], [place] [address] [place], [place] [address]     Report Status PENDING  Incomplete  SARS Coronavirus 2 [facility] order, Performed in [facility] hospital lab) Nasopharyngeal Nasopharyngeal Swab     Status: None   Collection Time: 09/26/2018  7:44 PM   Specimen: Nasopharyngeal Swab  Result Value Ref Range Status   SARS Coronavirus 2 NEGATIVE NEGATIVE Final    Comment: (NOTE) If result is NEGATIVE SARS-CoV-2 target nucleic acids are NOT DETECTED. The SARS-CoV-2 RNA is generally detectable in upper and lower  respiratory specimens during the acute phase of infection. The lowest  concentration of SARS-CoV-2 viral copies this assay can detect is 250  copies / mL. A negative result does not preclude SARS-CoV-2 infection  and should not be used as the sole basis for treatment or other  patient management decisions.  A negative result may occur with  improper specimen collection / handling, submission of specimen other  than nasopharyngeal swab, presence of viral mutation(s) within the  areas targeted by this assay, and inadequate number of viral copies  (<250 copies / mL). A negative result must be combined with clinical  observations, patient history, and epidemiological information. If result is POSITIVE SARS-CoV-2 target nucleic acids are DETECTED. The SARS-CoV-2 RNA is generally detectable in upper and lower  respiratory specimens dur ing the acute phase of infection.  Positive  results are indicative of active infection with SARS-CoV-2.  Clinical  correlation with patient history and other diagnostic information is  necessary to determine patient infection status.  Positive results do  not rule out bacterial infection or co-infection with other viruses. If result is PRESUMPTIVE POSTIVE SARS-CoV-2 nucleic acids MAY BE PRESENT.   A presumptive positive result was obtained on the submitted specimen  and confirmed on repeat testing.  While 2019 novel coronavirus  (SARS-CoV-2) nucleic acids may be present in the submitted sample   additional confirmatory testing may be necessary for epidemiological  and / or clinical management purposes  to differentiate between  SARS-CoV-2 and other Sarbecovirus currently known to infect humans.  If clinically indicated additional testing with an alternate test  methodology [phone]) is advised. The SARS-CoV-2 RNA is generally  detectable in upper and lower respiratory sp ecimens during the acute  phase of infection. The expected result is Negative. Fact Sheet for Patients:  StrictlyIdeas.no Fact Sheet for Healthcare Providers: BankingDealers.co.za This test is not yet approved or cleared by the [place] FDA and has been authorized for detection and/or diagnosis of SARS-CoV-2 by FDA under an Emergency Use Authorization (EUA).  This EUA will remain in effect (meaning this test can be used) for the duration of the COVID-19 declaration under Section 564(b)(1) of the Act, 21 U.S.C. section 360bbb-3(b)(1), unless the authorization is terminated or revoked sooner. Performed at [facility], [place] [address] [place], [place] [address]      Labs: BNP (last 3 results) No results for input(s): BNP in the last 8760 hours. Basic Metabolic Panel: Recent Labs  Lab 10/02/2018 1705 2018-10-23 0331  NA 136 137  K 3.2* 4.0  CL 95* 108  CO2 22 19*  GLUCOSE 198* 129*  BUN 22 25*  CREATININE 1.56* 1.32*  CALCIUM 9.1 7.2*  MG 1.9  --    Liver Function Tests: Recent Labs  Lab 09/24/2018 1705  AST 43*  ALT 29  ALKPHOS 82  BILITOT 1.1  PROT 6.4*  ALBUMIN 3.5   No results for input(s): LIPASE, AMYLASE in the last 168 hours. No results for input(s): AMMONIA in the last 168 hours. CBC: Recent Labs  Lab 09/30/2018 1705 October 23, 2018 0331 10/23/2018 1652  WBC 14.0* 8.3  --   NEUTROABS 12.4*  --   --   HGB 11.3* 7.2* 11.7*  HCT 35.9* 23.2* 36.8  MCV 108.1* 107.4*  --   PLT 171 109*  --    Cardiac Enzymes: Recent  Labs  Lab 10/19/2018 1705  CKTOTAL 135   BNP: Invalid input(s): POCBNP CBG: Recent Labs  Lab 10/20/2018 1536  GLUCAP 189*   D-Dimer No results for input(s): DDIMER in the last 72 hours. Hgb A1c No results for input(s): HGBA1C in the last 72 hours. Lipid Profile No results for input(s): CHOL, HDL, LDLCALC, TRIG, CHOLHDL, LDLDIRECT in the last 72 hours. Thyroid function studies No results for input(s): TSH, T4TOTAL, T3FREE, THYROIDAB in the last 72 hours.  Invalid input(s): FREET3 Anemia work up No results for input(s): VITAMINB12, FOLATE, FERRITIN, TIBC, IRON, RETICCTPCT in the last 72 hours. Urinalysis    Component Value Date/Time   COLORURINE YELLOW 10/01/2018 1944   APPEARANCEUR CLOUDY (A) 10/10/2018 1944   LABSPEC 1.009 09/23/2018 1944   PHURINE 7.0 10/08/2018 1944   GLUCOSEU NEGATIVE 09/26/2018 1944   HGBUR SMALL (A) 10/10/2018 1944   BILIRUBINUR NEGATIVE 09/29/2018 1944   KETONESUR NEGATIVE 09/28/2018 1944   PROTEINUR 30 (A) 10/01/2018 1944   UROBILINOGEN 1.0 09/11/2014 0111   NITRITE NEGATIVE 10/16/2018 1944   LEUKOCYTESUR LARGE (A) 10/11/2018 1944   Sepsis  Labs Invalid input(s): PROCALCITONIN,  WBC,  LACTICIDVEN Microbiology Recent Results (from the past 240 hour(s))  Blood Culture (routine x 2)     Status: None (Preliminary result)   Collection Time: 10/09/2018  6:10 PM   Specimen: BLOOD  Result Value Ref Range Status   Specimen Description   Final    BLOOD RIGHT ANTECUBITAL Performed at [facility] [address] [place] [address], [place], [place] [address]    Special Requests   Final    BOTTLES DRAWN AEROBIC AND ANAEROBIC Blood Culture adequate volume Performed at [facility] [address], [place], [place] [address]    Culture   Final    NO GROWTH 2 DAYS Performed at [facility] [address], [place], [place] [address]    Report Status PENDING  Incomplete  Blood Culture (routine x 2)     Status: None (Preliminary  result)   Collection Time: 09/29/2018  6:19 PM   Specimen: BLOOD  Result Value Ref Range Status   Specimen Description   Final    BLOOD RIGHT ARM Performed at [facility] [address] [place], [place] [address]    Special Requests   Final    BOTTLES DRAWN AEROBIC ONLY Blood Culture results may not be optimal due to an inadequate volume of blood received in culture bottles Performed at [facility] [address], [place], [place] [address]    Culture   Final    NO GROWTH 2 DAYS Performed at [facility] [address], [place], [place] [address]    Report Status PENDING  Incomplete  Urine culture     Status: Abnormal (Preliminary result)   Collection Time: 10/20/2018  7:44 PM   Specimen: In/Out Cath Urine  Result Value Ref Range Status   Specimen Description   Final    IN/OUT CATH URINE Performed at [facility] [address], [place], [place] [address]    Special Requests   Final    NONE Performed at [facility], [place] [address], [place], [place] [address]    Culture (A)  Final    50,000 COLONIES/mL KLEBSIELLA OXYTOCA SUSCEPTIBILITIES TO FOLLOW Performed at [facility], [place] [address], [place],  [address]    Report Status PENDING  Incomplete  SARS Coronavirus 2 [facility] order, Performed in [facility] hospital lab) Nasopharyngeal Nasopharyngeal Swab     Status: None   Collection Time: 10/15/2018  7:44 PM   Specimen: Nasopharyngeal Swab  Result Value Ref Range Status   SARS Coronavirus 2 NEGATIVE NEGATIVE Final    Comment: (NOTE) If result is NEGATIVE SARS-CoV-2 target nucleic acids are NOT DETECTED. The SARS-CoV-2 RNA is generally detectable in upper and lower  respiratory specimens during the acute phase of infection. The lowest  concentration of SARS-CoV-2 viral copies this assay can detect is 250  copies / mL. A negative result does not preclude SARS-CoV-2 infection   and should not be used as the sole basis for treatment or other  patient management decisions.  A negative result may occur with  improper specimen collection / handling, submission of specimen other  than nasopharyngeal swab, presence of viral mutation(s) within the  areas targeted by this assay, and inadequate number of viral copies  (<250 copies / mL). A negative result must be combined with clinical  observations, patient history, and epidemiological information. If result is POSITIVE SARS-CoV-2 target nucleic acids are DETECTED. The  SARS-CoV-2 RNA is generally detectable in upper and lower  respiratory specimens dur ing the acute phase of infection.  Positive  results are indicative of active infection with SARS-CoV-2.  Clinical  correlation with patient history and other diagnostic information is  necessary to determine patient infection status.  Positive results do  not rule out bacterial infection or co-infection with other viruses. If result is PRESUMPTIVE POSTIVE SARS-CoV-2 nucleic acids MAY BE PRESENT.   A presumptive positive result was obtained on the submitted specimen  and confirmed on repeat testing.  While 2019 novel coronavirus  (SARS-CoV-2) nucleic acids may be present in the submitted sample  additional confirmatory testing may be necessary for epidemiological  and / or clinical management purposes  to differentiate between  SARS-CoV-2 and other Sarbecovirus currently known to infect humans.  If clinically indicated additional testing with an alternate test  methodology [phone]) is advised. The SARS-CoV-2 RNA is generally  detectable in upper and lower respiratory sp ecimens during the acute  phase of infection. The expected result is Negative. Fact Sheet for Patients:  StrictlyIdeas.no Fact Sheet for Healthcare Providers: BankingDealers.co.za This test is not yet approved or cleared by the [place] FDA  and has been authorized for detection and/or diagnosis of SARS-CoV-2 by FDA under an Emergency Use Authorization (EUA).  This EUA will remain in effect (meaning this test can be used) for the duration of the COVID-19 declaration under Section 564(b)(1) of the Act, 21 U.S.C. section 360bbb-3(b)(1), unless the authorization is terminated or revoked sooner. Performed at [facility], [place] [address] [place], [place] [address]      Time coordinating discharge: <30 minutes  SIGNED:   Louellen Molder, MD  [facility] 10/13/2018, 2:21 PM Pager   If 7PM-7AM, please contact night-coverage [email or web address] Password [email or web address]

## 2018-10-13 NOTE — Progress Notes (Signed)
Patient going to [facility] at this time. Per ME request, Patient is being sent down "As Is" with foley, leg immobilizers, clothes on, IV's, etc.Holly Weaver, Holly Weaver

## 2018-10-14 LAB — URINE CULTURE: Culture: 50000 — AB

## 2018-10-16 ENCOUNTER — Other Ambulatory Visit: Payer: Medicare Other

## 2018-10-16 ENCOUNTER — Ambulatory Visit: Payer: Medicare Other | Admitting: Oncology

## 2018-10-16 LAB — CULTURE, BLOOD (ROUTINE X 2)
Culture: NO GROWTH
Culture: NO GROWTH
Special Requests: ADEQUATE

## 2018-10-23 NOTE — Progress Notes (Addendum)
Xcover Updated daughter that patient's bp low and daughter requesting pressors at this time. She hasn't made up her mind regarding surgery or not for her mother.   Updated Dr. Jimmy Footman.  She is going to start pressors for now.   Please attempt to call the daughter in the am regarding pressors and ultimate goals of care.   T 98.6, P 97 R 25 Bp 78/38 pox 98% on RA  Heent Anicteric, pale conjunctiva Neck:no jvd Heart: rrr s1, s2 Lung: ctab Abd: soft, nt, nd, +bs Ext: no c/c/e  Anemia likely dilutional CT abd/ pelvis r/o retroperitoneal bleeding  Transfuse 2 units prbc Check h/h 1 hour after transfusion  Hypotension 1L Ns iv x1 Check cortisol level  Check cardiac echo Start pressors as above, Dr. Jimmy Footman to start.   Critical care time 30 minutes

## 2018-10-23 NOTE — ED Notes (Signed)
Pt changed to hospital bed for comfort. Sacral foam dressing and bilateral ankle foam dressings applied d/t risk of break down at bony prominences. Pt has area to the sacrum that is red, no open wound noted.

## 2018-10-23 NOTE — ED Notes (Signed)
Last bolus dose complete, pressure maintains in the 70/40's, MAP in the 50's. Mental status appropriate, no respiratory issues. This completed 4L NS, around 245 urine output. Reginal Lutes, spoke with Dr. Jimmy Footman, she advised to call Hospitalist. Spoke with Dr. Maudie Mercury in the ED, please give additional bolus at this time.

## 2018-10-23 NOTE — Progress Notes (Signed)
Palliative Care Telephone Note Re: Consult  Call received from ED RN this AM who expressed concerns and was seeking clarity re: patient's uncontrolled pain and symptoms, ICU bed orders and also comfort care plan.   Patient is 85 yo and has suffered bilateral femoral fractures from a fall. Goals are not entirely clear  from my chart review but I spoke with Dr. Valeta Harms with CCM and we both agree that given expressed desire to not undergo extensive surgery that would leave her bedbound, that a comfort care approach is medically the most appropriate course and aggressive symptom management for her pain should be initiated.  At the time of this note her daughter is on her way to the hospital. Our team will see in consultation this AM.  An ICU bed is not appropriate nor are continuation of pressors given her current condition - as well as based on CCM documentation and recommendations for comfort care/palliative care to be initiated and daughters consent for this approach.  ED RN reports patient is in a significant amount of discomfort and has not gotten relief from morphine administered earlier this AM. She does not have active orders for opioids or an outlines pain management plan.  I gave a verbal order for hydromorphone 0.5mg q1hr prn for pain and scheduled  IV Tylenol once she is admitted-given the extensive injury she has sustained this may require an opioid infusion and bolus dosing.  Complete palliative consultation to follow this AM.  Lane Hacker, DO Palliative Medicine

## 2018-10-23 NOTE — ED Notes (Signed)
Per Dr. Clementeen Graham, phenylephrine drip to remain at current rate until palliative service sees patient.

## 2018-10-23 NOTE — Progress Notes (Signed)
Foam dressing applied

## 2018-10-23 NOTE — Consult Note (Signed)
NAME:  Holly Weaver, MRN:  [id number], DOB:  07/24/1923, LOS: 1 ADMISSION DATE:  10/20/2018, CONSULTATION DATE: 2018-11-10 REFERRING MD: Hospitalists, CHIEF COMPLAINT: Hypotension  Brief History   85 year old with bilateral complex distal femur fractures Persistent hypotension despite volume resuscitation  History of present illness   Patient is a 85 year old that presented after a fall at home.  She was found on the ground after a few hours.  Work-up reveals complex bilateral distal femur fractures.  The patient also is on Eliquis.  Family requests that the patient be made a DNR.  Follow-up hemoglobin shows a drop from an initial 11.3 hemoglobin at 5 PM yesterday to 7.2 at 3 AM today.  Chemistries essentially unchanged and fairly unremarkable a GFR of 35.  Below services ordered a cortisol on echocardiogram.  Patient is on 60 mics per kilogram per minute of Neo-Synephrine.  I had a discussion by phone with her daughter [name], and she clearly does not want the patient to have surgery.  A good bit of this decision is based on a previous knee surgery that she had extreme difficulty recovering from.  This was 6 years ago.  She does not believe that she would recover from bilateral femur surgery and I agree.  Explained even with the surgery I believe that she would be indefinitely bedridden and ultimately would expire from complications postoperatively.  The daughter requests palliative care for an unrecoverable situation due to her current orthopedic injuries.  She understands that without surgery she will succumb to complications of her current injuries.  We had a discussion specifically about current therapy including vasopressors and she has agreed to keep her phenylephrine at current level and attempt to wean it down to maintain adequate blood pressure and not go back up on the dose.  She also clearly does not want her mother to have CPR or be placed on the ventilator.   Past Medical  History   Past Medical History:  Diagnosis Date  . Arthritis    "hands and legs" (10/09/2012)  . Atrial fibrillation ([place])   . Breast cancer ([place]) ~ 2006   "left" (10/09/2012)  . Cervical spondylolysis 09/21/2012  . Chronic lower back pain   . Compression fracture 09/21/2012  . COPD (chronic obstructive pulmonary disease) ([place])   . Dyslipidemia 09/21/2012  . Heart murmur   . History of blood transfusion    "years and years ago; I was anemic" (10/09/2012)  . Hypertension   . MVP (mitral valve prolapse) 09/23/2012  . Osteoarthritis 09/21/2012  . Osteoporosis   . Scoliosis   . Shortness of breath      Significant Hospital Events   NA  Consults:  NA  Procedures:  NA  Significant Diagnostic Tests:  NA  Micro Data:  NA  Antimicrobials:  NA  Interim history/subjective:  NA  Objective   Blood pressure (!) 78/38, pulse 97, temperature 98.6 F (37 C), resp. rate (!) 25, height 5' (1.524 m), weight 40.8 kg, SpO2 98 %.        Intake/Output Summary (Last 24 hours) at 10-Nov-2018 0529 Last data filed at 10-Nov-2018 9798 Gross per 24 hour  Intake 3920 ml  Output 280 ml  Net 3640 ml   Filed Weights   10/14/2018 1959  Weight: 40.8 kg    Examination: General: Frail white female awake alert not fully oriented HENT: WNL Lungs: Clear, decreased bs Cardiovascular: Regular rate rhythm Abdomen: Benign Extremities: Bilateral knee immobilizers in place  Neuro: Non focal,  cn 2-12 grossly intact GU: NA  Resolved Hospital Problem list   NA  Assessment & Plan:  1.  Complex bilateral distal femur fractures: Patient's daughter, [name], requests the patient not have surgery and that palliation is initiated   2.  Anemia:  Probable Combination of dilutional and possible blood loss anemia  3.History Afib on eliquis:  Eliquis held due to anemia and severe bilateral orthopedic injury  4.  Hypotension with cortisol and echocardiogram pending.  She is on low-dose  Neo-Synephrine currently.  We will hold her dose here and try to wean down as discussion with daughter outlined below.  Best practice:  Diet:AS above Pain/Anxiety/Delirium protocol (if indicated): Morphine VAP protocol (if indicated): NA DVT prophylaxis: eliquis held GI prophylaxis:NA Glucose control: Monitor Mobility: Bedrest Code Status: DNR Family Communication: Discussed With daughter as below Disposition: to ICU  Labs   CBC: Recent Labs  Lab 09/30/2018 1705 10-28-18 0331  WBC 14.0* 8.3  NEUTROABS 12.4*  --   HGB 11.3* 7.2*  HCT 35.9* 23.2*  MCV 108.1* 107.4*  PLT 171 109*    Basic Metabolic Panel: Recent Labs  Lab 10/20/2018 1705 10/28/2018 0331  NA 136 137  K 3.2* 4.0  CL 95* 108  CO2 22 19*  GLUCOSE 198* 129*  BUN 22 25*  CREATININE 1.56* 1.32*  CALCIUM 9.1 7.2*  MG 1.9  --    GFR: Estimated Creatinine Clearance: 17.2 mL/min (A) (by C-G formula based on SCr of 1.32 mg/dL (H)). Recent Labs  Lab 10/19/2018 1705 09/27/2018 1810 10/17/2018 1905 10/28/2018 0331  WBC 14.0*  --   --  8.3  LATICACIDVEN  --  5.6* 4.0*  --     Liver Function Tests: Recent Labs  Lab 10/02/2018 1705  AST 43*  ALT 29  ALKPHOS 82  BILITOT 1.1  PROT 6.4*  ALBUMIN 3.5   No results for input(s): LIPASE, AMYLASE in the last 168 hours. No results for input(s): AMMONIA in the last 168 hours.  ABG    Component Value Date/Time   TCO2 32 01/27/2017 1705     Coagulation Profile: Recent Labs  Lab 09/29/2018 1705  INR 2.2*    Cardiac Enzymes: Recent Labs  Lab 09/27/2018 1705  CKTOTAL 135    HbA1C: Hgb A1c MFr Bld  Date/Time Value Ref Range Status  11/27/2016 05:27 AM 5.8 (H) 4.8 - 5.6 % Final    Comment:    (NOTE) Pre diabetes:          5.7%-6.4% Diabetes:              >6.4% Glycemic control for   <7.0% adults with diabetes     CBG: Recent Labs  Lab 10/11/18 1536  GLUCAP 189*    Review of Systems:   Difficult to obtain, patient in obvious pain.  Past Medical  History  She,  has a past medical history of Arthritis, Atrial fibrillation ([place]), Breast cancer ([place]) (~ 2006), Cervical spondylolysis (09/21/2012), Chronic lower back pain, Compression fracture (09/21/2012), COPD (chronic obstructive pulmonary disease) ([place]), Dyslipidemia (09/21/2012), Heart murmur, History of blood transfusion, Hypertension, MVP (mitral valve prolapse) (09/23/2012), Osteoarthritis (09/21/2012), Osteoporosis, Scoliosis, and Shortness of breath.   Surgical History    Past Surgical History:  Procedure Laterality Date  . BREAST BIOPSY Left ~ 2006  . CARDIAC CATHETERIZATION  11/1998  . CATARACT EXTRACTION W/ INTRAOCULAR LENS IMPLANT Left 2000's  . FEMUR IM NAIL Left 09/21/2012   Procedure: INTRAMEDULLARY (IM) NAIL FEMORAL;  Surgeon: Newt Minion,  MD;  Location: [facility];  Service: Orthopedics;  Laterality: Left;  . HERNIA REPAIR  1950's?     Social History   reports that she has never smoked. She has never used smokeless tobacco. She reports that she does not drink alcohol or use drugs.   Family History   Her family history includes CAD in her father; Cancer in her mother and another family member.   Allergies Allergies  Allergen Reactions  . Codeine Nausea And Vomiting     Home Medications  Prior to Admission medications   Medication Sig Start Date End Date Taking? Authorizing Provider  ALPRAZolam (XANAX) 0.25 MG tablet Take 1 tablet (0.25 mg total) by mouth 2 (two) times daily as needed for anxiety. Take one tablet by mouth every 12 hours as needed for severe anxiety Patient taking differently: Take 0.25 mg by mouth 2 (two) times daily as needed for anxiety.  01/29/17  Yes Rosita Fire, MD  amiodarone (PACERONE) 200 MG tablet Take 200 mg by mouth every morning.  12/06/11  Yes Alvstad, Kristin L, RPH-CPP  atorvastatin (LIPITOR) 40 MG tablet Take 40 mg daily by mouth.   Yes [provider]  calcium-vitamin D (OSCAL WITH D) 500-200 MG-UNIT per tablet Take 1  tablet by mouth every morning.    Yes [provider]  ELIQUIS 2.5 MG TABS tablet Take 2.5 mg by mouth 2 (two) times daily.  03/10/13  Yes [provider]  fluticasone furoate-vilanterol (BREO ELLIPTA) 200-25 MCG/INH AEPB Inhale 1 puff into the lungs as needed (sob).    Yes [provider]  furosemide (LASIX) 40 MG tablet Take 1 tablet (40 mg total) by mouth daily. 10/14/12  Yes [name], MD  HYDROcodone-acetaminophen (NORCO/VICODIN) 5-325 MG tablet Take 1 tablet by mouth every 6 (six) hours as needed for severe pain. 01/29/17  Yes Rosita Fire, MD  letrozole [facility]) 2.5 MG tablet Take 1 tablet (2.5 mg total) by mouth daily. 08/04/18  Yes Magrinat, Virgie Dad, MD  levothyroxine (SYNTHROID) 25 MCG tablet Take 25 mcg by mouth daily before breakfast. 05/30/18  Yes [provider]  lidocaine (LIDODERM) 5 % Place 1 patch onto the skin. UNWRAP AND APPLY 1 PATCH TO SKIN DAILY FOR 12 HOURS AND 12 HOURS OFF 11/22/16  Yes [provider]  Multiple Vitamin (MULTIVITAMIN WITH MINERALS) TABS Take 1 tablet by mouth every morning. Centrum 50+   Yes [provider]  olmesartan (BENICAR) 20 MG tablet Take 20 mg by mouth daily.   Yes [provider]  ondansetron (ZOFRAN) 4 MG tablet Take 4 mg by mouth every 8 (eight) hours as needed for nausea or vomiting.   Yes [provider]  polyethylene glycol (MIRALAX / GLYCOLAX) packet Take 17 g by mouth daily as needed for mild constipation or moderate constipation.    Yes [provider]  potassium chloride SA (K-DUR,KLOR-CON) 20 MEQ tablet Take 20 mEq by mouth daily.  04/13/13  Yes [provider]  senna (SENOKOT) 8.6 MG tablet Take 1 tablet by mouth daily.   Yes [provider]  traMADol (ULTRAM) 50 MG tablet Take 1 tablet (50 mg total) every 8 (eight) hours as needed by mouth for moderate pain or severe pain. 11/29/16  Yes Hosie Poisson, MD  Vitamin D, Ergocalciferol,  (DRISDOL) 50000 UNITS CAPS Take 50,000 Units by mouth every 7 (seven) days.   Yes [provider]  feeding supplement, ENSURE ENLIVE, (ENSURE ENLIVE) LIQD Take 237 mLs by mouth 2 (  two) times daily between meals. Patient not taking: Reported on 10/01/2018 09/15/14   Modena Jansky, MD     Critical care time: 35 minutes in bedside evaluation, chart review and critical care planning

## 2018-10-23 NOTE — Progress Notes (Signed)
Patient ID: Holly Weaver, female   DOB: 27-Aug-1923, 85 y.o.   MRN: [id number] I did review the chart on this patient and saw the notes from the hospitalist as well as palliative care.  I agree with their assessment and plan for the patient as well as the family's wishes.  We will not come by for an orthopedic consultation unless there is a request for [place] to do so.  Do not hesitate to call for any questions or concerns.

## 2018-10-23 NOTE — Progress Notes (Signed)
Spoke with family and they do not want to come up to visit patient at this time. She reports there are no belongings that need to be collected.  Family has selected [facility] [employer] [facility]. CDS was called. Medical Examiner was also notified due to the nature of her admission (fall PTA and fracture).  ME to pick up case.  Will be sending patient to morgue for examination.Roderick Pee

## 2018-10-23 NOTE — ED Notes (Signed)
Pt remains hypotensive, hgb 7.2. pt continues to c/o pain. Spoke with dr. Maudie Mercury, order for tramadol for pain, further IV, ct scan and transfusion. CT notified of high priority for scan.

## 2018-10-23 NOTE — ED Notes (Signed)
CCM has spoke with pt daughter, at this time, do not increase pt neosynephrine, it can slowly be titrated down by 52mcg/min to maintain map. New admit order for pt to stay at [facility].

## 2018-10-23 NOTE — Consult Note (Signed)
Consultation Note Date: 21-Oct-2018   Patient Name: Holly Weaver  DOB: 05-Jul-1923  MRN: [id number]  Age / Sex: 85 y.o., female  PCP: [name], MD Referring Physician: Louellen Molder, MD  Reason for Consultation: Establishing goals of care, Pain control and Terminal Care  HPI/Patient Profile: 85 y.o. female  with past medical history of atrial fibrillation for which she is on Eliquis, chronic obstructive pulmonary disease, she sees Dr. Jana Hakim for history of breast cancer and is on medication for that, dyslipidemia and hypertension admitted on 09/23/2018 with fall when she was trying to stand up on her walker and trying to change her depends, found to have bilateral distal femoral fractures.   Clinical Assessment and Goals of Care: 85 year old lady who lives with her daughter.  At baseline, for the most part, she is not very ambulatory.  She had a hip fracture in 03-09-2012 and underwent surgery and rehab for that.  Some part of the rehab course at that time was complicated by MRSA and recurrent hospitalization.  In 03/09/2016 she had a knee fracture that was reportedly managed nonsurgically and she went to rehab at that time as well.  She has had back pain and osteoporosis.  She has other medical conditions as listed above.  She lives with full assistance from her daughter pretty much on a 24/7 basis.  I met with the patient and her daughter Holly Weaver who is now arrived at the bedside.  We discussed in detail about scope of current hospitalization.  I introduced myself and palliative care also defined goals of care as follows:  Palliative medicine is specialized medical care for people living with serious illness. It focuses on providing relief from the symptoms and stress of a serious illness. The goal is to improve quality of life for both the patient and the family.  Goals of care: Broad aims of medical therapy in  relation to the patient's values and preferences. Our aim is to provide medical care aimed at enabling patients to achieve the goals that matter most to them, given the circumstances of their particular medical situation and their constraints.   Brief life review performed.  Goals wishes and values important to the patient and the daughter as a unit were discussed in detail.  Attempted to provide compassionate presents and reflective listening to better understand patient's situation currently.  Patient is not out of bed all that much at home, she has a hospital bed.  She uses Lidoderm patches for her back pain, has compression fractures in thoracic spine.  Daughter describes the patient as " sharp as tack.  I know my mother is 24 but I want her to be alive for as long as possible.  Of course I do not want her to suffer.  I do not want surgery."  Please see additional discussion/recommendations as listed below.  Thank you for the consult.   HCPOA Daughter Holly Weaver is the healthcare power of attorney agent and next of kin. Patient has 2 children, son died in Mar 09, 2016.  SUMMARY OF RECOMMENDATIONS   DO NOT RESUSCITATE/DO NOT INTUBATE. Admit to hospital medicine service, non-ICU bed.  Discussed extensively with patient's daughter about vasopressors.  Patient reportedly has a history of having low blood pressures.  Patient awake alert mentating normally at this time denies chest pain denies shortness of breath. Pain and non-pain symptom management measures to continue. Patient's daughter wishes to keep the patient comfortable, eventually take her home with hospice.  However, her goals are not for comfort measures only.  She does want the patient to get blood transfusions, IV antibiotics, essentially to treat whatever we can reasonably treat and then transition over to home with hospice support.  She wishes to have the patient be as well as she can for as long as she can.  Code Status/Advance Care Planning:   DNR    Symptom Management:     As above Palliative Prophylaxis:   Delirium Protocol  Psycho-social/Spiritual:   Desire for further Chaplaincy support:yes  Additional Recommendations: Education on Hospice  Prognosis:   < 6 weeks Likely Discharge Planning: Home with Hospice  Patient's daughter is aware of the type of care that can be provided and residential hospice, she is aware of [facility] locally.  Desires home with hospice for now with the patient is ready to be discharged.     Primary Diagnoses: Present on Admission: . Sepsis ([place]) . Hypotension   I have reviewed the medical record, interviewed the patient and family, and examined the patient. The following aspects are pertinent.  Past Medical History:  Diagnosis Date  . Arthritis    "hands and legs" (10/09/2012)  . Atrial fibrillation ([place])   . Breast cancer ([place]) ~ 2006   "left" (10/09/2012)  . Cervical spondylolysis 09/21/2012  . Chronic lower back pain   . Compression fracture 09/21/2012  . COPD (chronic obstructive pulmonary disease) ([place])   . Dyslipidemia 09/21/2012  . Heart murmur   . History of blood transfusion    "years and years ago; I was anemic" (10/09/2012)  . Hypertension   . MVP (mitral valve prolapse) 09/23/2012  . Osteoarthritis 09/21/2012  . Osteoporosis   . Scoliosis   . Shortness of breath    "just now; nose is dry" (10/09/2012)   Social History   Socioeconomic History  . Marital status: Divorced    Spouse name: Not on file  . Number of children: Not on file  . Years of education: Not on file  . Highest education level: Not on file  Occupational History  . Not on file  Social Needs  . Financial resource strain: Not on file  . Food insecurity    Worry: Not on file    Inability: Not on file  . Transportation needs    Medical: Not on file    Non-medical: Not on file  Tobacco Use  . Smoking status: Never Smoker  . Smokeless tobacco: Never Used  Substance and Sexual Activity   . Alcohol use: No  . Drug use: No  . Sexual activity: Never  Lifestyle  . Physical activity    Days per week: Not on file    Minutes per session: Not on file  . Stress: Not on file  Relationships  . Social Herbalist on phone: Not on file    Gets together: Not on file    Attends religious service: Not on file    Active member of club or organization: Not on file    Attends meetings of clubs  or organizations: Not on file    Relationship status: Not on file  Other Topics Concern  . Not on file  Social History Narrative  . Not on file   Family History  Problem Relation Age of Onset  . Cancer Mother   . CAD Father   . Cancer Other    Scheduled Meds: . amiodarone  200 mg Oral q morning - 10a  . atorvastatin  40 mg Oral Daily  . calcium-vitamin D  1 tablet Oral Q breakfast  . levothyroxine  25 mcg Oral QAC breakfast  . pantoprazole (PROTONIX) IV  40 mg Intravenous Q12H  . vancomycin variable dose per unstable renal function (pharmacist dosing)   Does not apply See admin instructions   Continuous Infusions: . sodium chloride 100 mL/hr at Oct 29, 2018 0555  . ceFEPime (MAXIPIME) IV     PRN Meds:.acetaminophen **OR** acetaminophen, ALPRAZolam, fluticasone furoate-vilanterol, HYDROmorphone (DILAUDID) injection, ipratropium-albuterol, morphine injection, ondansetron (ZOFRAN) IV, oxyCODONE Medications Prior to Admission:  Prior to Admission medications   Medication Sig Start Date End Date Taking? Authorizing Provider  ALPRAZolam (XANAX) 0.25 MG tablet Take 1 tablet (0.25 mg total) by mouth 2 (two) times daily as needed for anxiety. Take one tablet by mouth every 12 hours as needed for severe anxiety Patient taking differently: Take 0.25 mg by mouth 2 (two) times daily as needed for anxiety.  01/29/17  Yes Rosita Fire, MD  amiodarone (PACERONE) 200 MG tablet Take 200 mg by mouth every morning.  12/06/11  Yes Alvstad, Kristin L, RPH-CPP  atorvastatin (LIPITOR) 40 MG  tablet Take 40 mg daily by mouth.   Yes [provider]  calcium-vitamin D (OSCAL WITH D) 500-200 MG-UNIT per tablet Take 1 tablet by mouth every morning.    Yes [provider]  ELIQUIS 2.5 MG TABS tablet Take 2.5 mg by mouth 2 (two) times daily.  03/10/13  Yes [provider]  fluticasone furoate-vilanterol (BREO ELLIPTA) 200-25 MCG/INH AEPB Inhale 1 puff into the lungs as needed (sob).    Yes [provider]  furosemide (LASIX) 40 MG tablet Take 1 tablet (40 mg total) by mouth daily. 10/14/12  Yes [name], MD  HYDROcodone-acetaminophen (NORCO/VICODIN) 5-325 MG tablet Take 1 tablet by mouth every 6 (six) hours as needed for severe pain. 01/29/17  Yes Rosita Fire, MD  letrozole [facility]) 2.5 MG tablet Take 1 tablet (2.5 mg total) by mouth daily. 08/04/18  Yes Magrinat, Virgie Dad, MD  levothyroxine (SYNTHROID) 25 MCG tablet Take 25 mcg by mouth daily before breakfast. 05/30/18  Yes [provider]  lidocaine (LIDODERM) 5 % Place 1 patch onto the skin. UNWRAP AND APPLY 1 PATCH TO SKIN DAILY FOR 12 HOURS AND 12 HOURS OFF 11/22/16  Yes [provider]  Multiple Vitamin (MULTIVITAMIN WITH MINERALS) TABS Take 1 tablet by mouth every morning. Centrum 50+   Yes [provider]  olmesartan (BENICAR) 20 MG tablet Take 20 mg by mouth daily.   Yes [provider]  ondansetron (ZOFRAN) 4 MG tablet Take 4 mg by mouth every 8 (eight) hours as needed for nausea or vomiting.   Yes [provider]  polyethylene glycol (MIRALAX / GLYCOLAX) packet Take 17 g by mouth daily as needed for mild constipation or moderate constipation.    Yes [provider]  potassium chloride SA (K-DUR,KLOR-CON) 20 MEQ tablet Take 20 mEq by mouth daily.  04/13/13  Yes [provider]  senna (SENOKOT) 8.6 MG tablet Take 1 tablet  by mouth daily.   Yes [provider]  traMADol (ULTRAM) 50 MG tablet Take 1 tablet (50 mg total) every  8 (eight) hours as needed by mouth for moderate pain or severe pain. 11/29/16  Yes Hosie Poisson, MD  Vitamin D, Ergocalciferol, (DRISDOL) 50000 UNITS CAPS Take 50,000 Units by mouth every 7 (seven) days.   Yes [provider]  feeding supplement, ENSURE ENLIVE, (ENSURE ENLIVE) LIQD Take 237 mLs by mouth 2 (two) times daily between meals. Patient not taking: Reported on 10/18/2018 09/15/14   Modena Jansky, MD   Allergies  Allergen Reactions  . Codeine Nausea And Vomiting   Review of Systems A little hard of hearing. Does not experience pain or discomfort in her hips if she does not move  Physical Exam Frail elderly lady resting in bed She currently has a bear hugger on Irregularly irregular Clear breath sounds anterior lung fields Skin has scattered ecchymosis Awake alert oriented, a little hard of hearing but answers all questions appropriately Abdomen is soft nondistended nontender  Vital Signs: BP (!) 90/45   Pulse 89   Temp 98.6 F (37 C) (Bladder)   Resp 18   Ht 5' (1.524 m)   Wt 40.8 kg   SpO2 95%   BMI 17.58 kg/m  Pain Scale: 0-10   Pain Score: Asleep   SpO2: SpO2: 95 % O2 Device:SpO2: 95 % O2 Flow Rate: .   IO: Intake/output summary:   Intake/Output Summary (Last 24 hours) at 2018-10-13 1118 Last data filed at 10-13-18 4401 Gross per 24 hour  Intake 5030.47 ml  Output 280 ml  Net 4750.47 ml    LBM:   Baseline Weight: Weight: 40.8 kg Most recent weight: Weight: 40.8 kg     Palliative Assessment/Data:   Flowsheet Rows     Most Recent Value  Intake Tab  Referral Department  Hospitalist  Unit at Time of Referral  ER  Palliative Care Primary Diagnosis  Pain  Palliative Care Type  New Palliative care  Reason for referral  Pain, Clarify Goals of Care  Date first seen by Palliative Care  10/13/2018  Clinical Assessment  Palliative Performance Scale Score  40%  Psychosocial & Spiritual Assessment  Palliative Care Outcomes      Time In:  10 Time Out: 1110 Time Total:   70 Greater than 50%  of this time was spent counseling and coordinating care related to the above assessment and plan.  Signed by: Loistine Chance, MD [phone] Please contact Palliative Medicine Team phone at [phone] for questions and concerns.  For individual provider: See Shea Evans

## 2018-10-23 NOTE — Progress Notes (Signed)
Lt shin eccyhmotic

## 2018-10-23 NOTE — ED Notes (Signed)
Spoke to Dr. Hilma Favors with palliative care. She advised to give 0.5 dilaudid PRN and call if pain is not improving. Also advised to take pt off phenylephrine drip and change bed to med surgical floor.

## 2018-10-23 NOTE — ED Notes (Signed)
Maudie Mercury, MD Provider at bedside.

## 2018-10-23 NOTE — ED Notes (Signed)
CCM at bedside 

## 2018-10-23 NOTE — Progress Notes (Cosign Needed Addendum)
Upon rounding, patient was found to be non-responsive, pupils dilated, no heart rate, no RR. Patient expired at 2200 pm. 2nd RN, Oletha Cruel verified this death.  MD was notified, [facility] was notified, and daughter cheryl (emergency contact) was called.  Will call [place] donor services at this time.Holly Weaver

## 2018-10-23 NOTE — ED Notes (Signed)
Pt was able to speak with her daughter on the phone, daughter en route to ED now.

## 2018-10-23 NOTE — Progress Notes (Signed)
Pharmacy Antibiotic Note  Holly Weaver is a 85 y.o. female presented to the ED on 10/20/2018 s/p fall at home with noted bilateral distal femur fx.  Current plan is not to proceed with surgical intervention and to consult palliative care for further recommendations.  She was started on vancomycin and cefepime on admission for suspected sepsis.  Today, 11/11/2018: - scr down 1.32 (crcl~17) - afeb, wbc down to wnl - all cultures have been negative thus far - currently on pressor for hypotension - random vancomycin = 14 (about 8 hrs after the 1 gm dose on 9/19)   Plan: - repeat vancomycin 1000 mg IV x1 this morning. Recheck another level tonight at 10p and redose if < 20 - continue cefepime 1 gm IV q24h - monitor renal function closely - if to proceed with the palliative care route, please indicate plan for abx  _______________________________________  Height: 5' (152.4 cm) Weight: 90 lb (40.8 kg) IBW/kg (Calculated) : 45.5  Temp (24hrs), Avg:97.9 F (36.6 C), Min:94.4 F (34.7 C), Max:98.6 F (37 C)  Recent Labs  Lab 10/21/2018 1705 10/10/2018 1810 10/14/2018 1905 2018-11-11 0331  WBC 14.0*  --   --  8.3  CREATININE 1.56*  --   --  1.32*  LATICACIDVEN  --  5.6* 4.0*  --   VANCORANDOM  --   --   --  14    Estimated Creatinine Clearance: 17.2 mL/min (A) (by C-G formula based on SCr of 1.32 mg/dL (H)).    Allergies  Allergen Reactions  . Codeine Nausea And Vomiting     Thank you for allowing pharmacy to be a part of this patient's care.  Lynelle Doctor 11-11-18 7:20 AM

## 2018-10-23 NOTE — Progress Notes (Signed)
PROGRESS NOTE                                                                                                                                                                                                             Patient Demographics:    Holly Weaver, is a 85 y.o. female, DOB - [birth year], IRJ:[id number]  Admit date - 10/08/2018   Admitting Physician No admitting provider for patient encounter.  Outpatient Primary MD for the patient is [name], MD  LOS - 1    Chief Complaint  Patient presents with   Fall       Brief Narrative   85 year old female with history of A. fib on Eliquis, COPD, dyslipidemia, hypertension and history of breast cancer presented with fall.  Her daughter is her caretaker who found patient on the floor.  Patient does not remember the events.  In the ED she was found to have leukocytosis and elevated lactic acid and started on empiric antibiotic with concern for sepsis.  Imaging done in the ED showed bilateral wrist femoral fractures.  Patient became hypotensive and was started on pressors.  Orthopedic surgery consulted who recommended transfer to [facility] for surgical repair. Patient became hypotensive and was started on pressors in the ED.  Given her advanced age, comorbidities and high risk for surgery PCCM and palliative care discussed with patient's daughter who refused for surgery, wanted to keep patient comfortable off pressors and possibly home with hospice.   Subjective:   Patient complains of pain in her legs.  Denies any chest pain or shortness of breath.   Assessment  & Plan :     Principal problem  Severe sepsis with septic shock, possibly secondary to UTI Patient hypotensive and hypothermic in the ED.  Started on pressors and empiric IV vancomycin and cefepime.  Chest x-ray negative for infection.  UA suggestive of UTI.  Culture sent.   After discussion with  daughter pressors discontinued.  She wishes to continue antibiotics, fluids and blood transfusion as needed and treat the treatable.  Refuses patient to have surgery since patient is usually bedbound and will likely have an eventful perioperative course. Patient made DNR and admitted to medical floor. Palliative care consult appreciated.  Pain control with low-dose PRN Dilaudid. Once patient stable plan to discharge her home with home hospice.  Bilateral distal femoral fracture Secondary to fall.  Pain control with low-dose as needed Dilaudid.  Orthopedics consult appreciated.  No plan for surgery.  Add bowel regimen.  Active symptoms Paroxysmal A. fib Continue amiodarone.  Hold Eliquis  COPD Stable.  Continue home inhaler.  Hypertension Hold home meds  Acute kidney injury Suspect renal.  Hold Lasix and Benicar.  IV fluids  Hypokalemia Replenished   Code Status : DNR  Family Communication  : We will update daughter  Disposition Plan  : Possibly home with hospice  Barriers For Discharge : Active symptoms  Consults  : Orthopedics, PCCM, palliative care  Procedures  : CT abdomen  DVT Prophylaxis  : SCDs  Lab Results  Component Value Date   PLT 109 (L) 2018-11-09    Antibiotics  :    Anti-infectives (From admission, onward)   Start     Dose/Rate Route Frequency Ordered Stop   09-Nov-2018 1800  ceFEPIme (MAXIPIME) 1 g in sodium chloride 0.9 % 100 mL IVPB     1 g 200 mL/hr over 30 Minutes Intravenous Every 24 hours 10/21/2018 2032     2018/11/09 0800  vancomycin (VANCOCIN) IVPB 1000 mg/200 mL premix     1,000 mg 200 mL/hr over 60 Minutes Intravenous  Once 11-09-18 0734 Nov 09, 2018 1043   10/10/2018 2031  vancomycin variable dose per unstable renal function (pharmacist dosing)      Does not apply See admin instructions 09/23/2018 2032     10/07/2018 1715  ceFEPIme (MAXIPIME) 2 g in sodium chloride 0.9 % 100 mL IVPB     2 g 200 mL/hr over 30 Minutes Intravenous  Once 09/24/2018 1705  10/16/2018 1921   10/15/2018 1715  metroNIDAZOLE (FLAGYL) IVPB 500 mg     500 mg 100 mL/hr over 60 Minutes Intravenous  Once 10/02/2018 1705 09/29/2018 2105   10/01/2018 1715  vancomycin (VANCOCIN) IVPB 1000 mg/200 mL premix     1,000 mg 200 mL/hr over 60 Minutes Intravenous  Once 09/30/2018 1705 09/25/2018 2105        Objective:   Vitals:   11-09-2018 0840 11/09/18 0915 2018/11/09 0915 11/09/18 0932  BP: (!) 85/56 (!) 86/44 (!) 86/44 (!) 90/45  Pulse:   (!) 102 89  Resp: 19 18 14 18   Temp: 98.4 F (36.9 C) 98.6 F (37 C) 98.6 F (37 C) 98.6 F (37 C)  TempSrc:   Bladder Bladder  SpO2: 95%  95% 95%  Weight:      Height:        Wt Readings from Last 3 Encounters:  10/07/2018 40.8 kg  05/07/17 45.4 kg  01/29/17 45.3 kg     Intake/Output Summary (Last 24 hours) at November 09, 2018 1226 Last data filed at Nov 09, 2018 0713 Gross per 24 hour  Intake 5030.47 ml  Output 280 ml  Net 4750.47 ml     Physical Exam  Gen: not in distress HEENT: no pallor, moist mucosa, supple neck Chest: clear b/l, no added sounds CVS: N S1&S2, no murmurs, rubs or gallop GI: soft, NT, ND, BS+ Musculoskeletal: warm, no edema CNS: AAOX3, non focal    Data Review:    CBC Recent Labs  Lab 10/11/18 1705 11-09-18 0331  WBC 14.0* 8.3  HGB 11.3* 7.2*  HCT 35.9* 23.2*  PLT 171 109*  MCV 108.1* 107.4*  MCH 34.0 33.3  MCHC 31.5 31.0  RDW 15.9* 15.8*  LYMPHSABS 0.8  --   MONOABS 0.7  --   EOSABS 0.0  --  BASOSABS 0.0  --     Chemistries  Recent Labs  Lab 10/15/2018 1705 Nov 11, 2018 0331  NA 136 137  K 3.2* 4.0  CL 95* 108  CO2 22 19*  GLUCOSE 198* 129*  BUN 22 25*  CREATININE 1.56* 1.32*  CALCIUM 9.1 7.2*  MG 1.9  --   AST 43*  --   ALT 29  --   ALKPHOS 82  --   BILITOT 1.1  --    ------------------------------------------------------------------------------------------------------------------ No results for input(s): CHOL, HDL, LDLCALC, TRIG, CHOLHDL, LDLDIRECT in the last 72 hours.  Lab  Results  Component Value Date   HGBA1C 5.8 (H) 11/27/2016   ------------------------------------------------------------------------------------------------------------------ No results for input(s): TSH, T4TOTAL, T3FREE, THYROIDAB in the last 72 hours.  Invalid input(s): FREET3 ------------------------------------------------------------------------------------------------------------------ No results for input(s): VITAMINB12, FOLATE, FERRITIN, TIBC, IRON, RETICCTPCT in the last 72 hours.  Coagulation profile Recent Labs  Lab 10/21/2018 1705  INR 2.2*    No results for input(s): DDIMER in the last 72 hours.  Cardiac Enzymes No results for input(s): CKMB, TROPONINI, MYOGLOBIN in the last 168 hours.  Invalid input(s): CK ------------------------------------------------------------------------------------------------------------------    Component Value Date/Time   BNP 429.0 (H) 01/27/2017 1655    Inpatient Medications  Scheduled Meds:  amiodarone  200 mg Oral q morning - 10a   atorvastatin  40 mg Oral Daily   calcium-vitamin D  1 tablet Oral Q breakfast   levothyroxine  25 mcg Oral QAC breakfast   pantoprazole (PROTONIX) IV  40 mg Intravenous Q12H   vancomycin variable dose per unstable renal function (pharmacist dosing)   Does not apply See admin instructions   Continuous Infusions:  sodium chloride 100 mL/hr at 11-Nov-2018 0555   ceFEPime (MAXIPIME) IV     PRN Meds:.acetaminophen **OR** acetaminophen, ALPRAZolam, fluticasone furoate-vilanterol, HYDROmorphone (DILAUDID) injection, ipratropium-albuterol, morphine injection, ondansetron (ZOFRAN) IV, oxyCODONE  Micro Results Recent Results (from the past 240 hour(s))  SARS Coronavirus 2 [facility] order, Performed in [facility] hospital lab) Nasopharyngeal Nasopharyngeal Swab     Status: None   Collection Time: 10/15/2018  7:44 PM   Specimen: Nasopharyngeal Swab  Result Value Ref Range Status   SARS Coronavirus 2  NEGATIVE NEGATIVE Final    Comment: (NOTE) If result is NEGATIVE SARS-CoV-2 target nucleic acids are NOT DETECTED. The SARS-CoV-2 RNA is generally detectable in upper and lower  respiratory specimens during the acute phase of infection. The lowest  concentration of SARS-CoV-2 viral copies this assay can detect is 250  copies / mL. A negative result does not preclude SARS-CoV-2 infection  and should not be used as the sole basis for treatment or other  patient management decisions.  A negative result may occur with  improper specimen collection / handling, submission of specimen other  than nasopharyngeal swab, presence of viral mutation(s) within the  areas targeted by this assay, and inadequate number of viral copies  (<250 copies / mL). A negative result must be combined with clinical  observations, patient history, and epidemiological information. If result is POSITIVE SARS-CoV-2 target nucleic acids are DETECTED. The SARS-CoV-2 RNA is generally detectable in upper and lower  respiratory specimens dur ing the acute phase of infection.  Positive  results are indicative of active infection with SARS-CoV-2.  Clinical  correlation with patient history and other diagnostic information is  necessary to determine patient infection status.  Positive results do  not rule out bacterial infection or co-infection with other viruses. If result is PRESUMPTIVE POSTIVE SARS-CoV-2 nucleic acids MAY BE  PRESENT.   A presumptive positive result was obtained on the submitted specimen  and confirmed on repeat testing.  While 2019 novel coronavirus  (SARS-CoV-2) nucleic acids may be present in the submitted sample  additional confirmatory testing may be necessary for epidemiological  and / or clinical management purposes  to differentiate between  SARS-CoV-2 and other Sarbecovirus currently known to infect humans.  If clinically indicated additional testing with an alternate test  methodology [phone])  is advised. The SARS-CoV-2 RNA is generally  detectable in upper and lower respiratory sp ecimens during the acute  phase of infection. The expected result is Negative. Fact Sheet for Patients:  StrictlyIdeas.no Fact Sheet for Healthcare Providers: BankingDealers.co.za This test is not yet approved or cleared by the [place] FDA and has been authorized for detection and/or diagnosis of SARS-CoV-2 by FDA under an Emergency Use Authorization (EUA).  This EUA will remain in effect (meaning this test can be used) for the duration of the COVID-19 declaration under Section 564(b)(1) of the Act, 21 U.S.C. section 360bbb-3(b)(1), unless the authorization is terminated or revoked sooner. Performed at [facility], [place] [address], [place], [place] [address]     Radiology Reports Ct Abdomen Pelvis Wo Contrast  Result Date: 10-27-18 CLINICAL DATA:  Abdominal pain. Clinical suspicion for pyelonephritis. EXAM: CT ABDOMEN AND PELVIS WITHOUT CONTRAST TECHNIQUE: Multidetector CT imaging of the abdomen and pelvis was performed following the standard protocol without IV contrast. COMPARISON:  11/26/2016 FINDINGS: Lower chest: Heart mildly enlarged. Small pericardial effusion. Trace pleural effusions. Dependent lung base opacity most likely atelectasis. Hepatobiliary: Liver relatively small increased attenuation diffusely. No liver mass or focal lesion. Gallbladder unremarkable. No bile duct dilation. Pancreas: Partial fatty replacement. No mass or convincing inflammation. Spleen: Choose 1 Adrenals/Urinary Tract: No adrenal masses. Right renal atrophy. No renal masses stones or hydronephrosis. No perinephric fluid collections. Ureters incompletely visualized, but appear normal in caliber and course with convincing stones. Bladder is decompressed with a Foley catheter. Stomach/Bowel: Moderate hiatal hernia. Stomach otherwise unremarkable.  Small bowel and colon are normal caliber. No convincing wall thickening or inflammation. Vascular/Lymphatic: Diffuse aortic atherosclerotic calcifications extending into the vessels. No aneurysm. No discrete enlarged lymph node. Reproductive: Uterus and bilateral adnexa are unremarkable. Other: Trace amount of ascites. Is generalized mesenteric vascular congestion increased attenuation in the mesenteric subcutaneous fat consistent diffuse edema. Musculoskeletal: Compression fractures of T11-T12 previously treated with vertebroplasty. Mild depression of the upper endplate L1 previous ORIF a left intertrochanteric hip fracture. No acute fractures. No osteoblastic or osteolytic lesions. No change from the prior CT. IMPRESSION: 1. No evidence of hydronephrosis or of a perinephric abscess. 2. Small pericardial effusion, trace pleural effusions and trace amount of ascites as well as soft tissue edema. Findings may be due to renal failure, cardiac decompensation or a combination. 3. Increased attenuation of the liver. This finding can be seen with iron deposition from transfusions. Hemochromatosis, hemosiderosis, Wilson's disease and long term amiodarone hepatotoxicity are in the differential differential diagnosis for a hyperattenuating liver. Electronically Signed   By: Lajean Manes M.D.   On: 10/27/18 05:33   Dg Chest 1 View  Result Date: 10/15/2018 CLINICAL DATA:  Knee pain after fall EXAM: CHEST  1 VIEW COMPARISON:  01/27/2017 FINDINGS: Heart remains mildly enlarged. Aortic calcified and tortuous. No focal airspace consolidation, pleural effusion, or pneumothorax. Bones are demineralized. Kyphoplasty changes within the lower thoracic spine. IMPRESSION: No acute cardiopulmonary findings. Electronically Signed   By: Marisue Brooklyn.D.  On: 09/30/2018 18:14   Dg Knee 1-2 Views Left  Result Date: 10/04/2018 CLINICAL DATA:  Knee pain after fall EXAM: LEFT KNEE - 1-2 VIEW COMPARISON:  05/07/2017 FINDINGS:  There is an acute impacted fracture of the distal left femoral metaphysis with mild posterior displacement and minimal medial displacement. Alignment at the knee joint appears maintained. Advanced tricompartmental degenerative changes of the knee. Bones are severely demineralized. Extensive vascular calcifications. IMPRESSION: Acute mildly displaced fracture of the distal left femoral metaphysis. Electronically Signed   By: Davina Poke M.D.   On: 10/18/2018 18:13   Dg Knee 1-2 Views Right  Result Date: 10/06/2018 CLINICAL DATA:  Right knee pain EXAM: RIGHT KNEE - 1-2 VIEW COMPARISON:  11/26/2016 FINDINGS: Acute impacted fracture of the distal femoral metaphysis with 3-4 cm of foreshortening. Valgus angulation at the fracture site. Tibial plateau appears intact. Severe tricompartment degenerative changes of the knee. Moderate joint effusion. Bones are severely demineralized. Extensive vascular calcifications. IMPRESSION: Acute, impacted and angulated fracture of the distal right femoral metaphysis. Electronically Signed   By: Davina Poke M.D.   On: 10/09/2018 18:11   Ct Head Wo Contrast  Result Date: 09/30/2018 CLINICAL DATA:  Fall, found down, pain EXAM: CT HEAD WITHOUT CONTRAST CT CERVICAL SPINE WITHOUT CONTRAST TECHNIQUE: Multidetector CT imaging of the head and cervical spine was performed following the standard protocol without intravenous contrast. Multiplanar CT image reconstructions of the cervical spine were also generated. COMPARISON:  None. FINDINGS: CT HEAD FINDINGS Brain: No evidence of acute infarction, hemorrhage, hydrocephalus, extra-axial collection or mass lesion/mass effect. Global cortical atrophy. Subcortical white matter and periventricular small vessel ischemic changes. Vascular: Intracranial atherosclerosis. Skull: Normal. Negative for fracture or focal lesion. Sinuses/Orbits: Partial opacification of the bilateral ethmoid sinuses. Other: None. CT CERVICAL SPINE FINDINGS  Alignment: Reversal of the normal mid cervical lordosis, chronic/degenerative. Skull base and vertebrae: No acute fracture. No primary bone lesion or focal pathologic process. Soft tissues and spinal canal: No prevertebral fluid or swelling. No visible canal hematoma. Disc levels: Moderate multilevel degenerative changes. Mild anterior wedging at C5 and C6. 4 mm anterolisthesis of C4 on C5. 2 mm retrolisthesis of C5 on C6. Spinal canal is patent. Upper chest: Visualized lung apices are clear. Other: Visualized thyroid is mildly heterogeneous/nodular. IMPRESSION: No evidence of acute intracranial abnormality. Atrophy with small vessel ischemic changes. No evidence of traumatic injury to the cervical spine. Moderate multilevel degenerative changes. Electronically Signed   By: Julian Hy M.D.   On: 10/14/2018 17:52   Ct Cervical Spine Wo Contrast  Result Date: 09/30/2018 CLINICAL DATA:  Fall, found down, pain EXAM: CT HEAD WITHOUT CONTRAST CT CERVICAL SPINE WITHOUT CONTRAST TECHNIQUE: Multidetector CT imaging of the head and cervical spine was performed following the standard protocol without intravenous contrast. Multiplanar CT image reconstructions of the cervical spine were also generated. COMPARISON:  None. FINDINGS: CT HEAD FINDINGS Brain: No evidence of acute infarction, hemorrhage, hydrocephalus, extra-axial collection or mass lesion/mass effect. Global cortical atrophy. Subcortical white matter and periventricular small vessel ischemic changes. Vascular: Intracranial atherosclerosis. Skull: Normal. Negative for fracture or focal lesion. Sinuses/Orbits: Partial opacification of the bilateral ethmoid sinuses. Other: None. CT CERVICAL SPINE FINDINGS Alignment: Reversal of the normal mid cervical lordosis, chronic/degenerative. Skull base and vertebrae: No acute fracture. No primary bone lesion or focal pathologic process. Soft tissues and spinal canal: No prevertebral fluid or swelling. No visible canal  hematoma. Disc levels: Moderate multilevel degenerative changes. Mild anterior wedging at C5 and C6. 4 mm anterolisthesis  of C4 on C5. 2 mm retrolisthesis of C5 on C6. Spinal canal is patent. Upper chest: Visualized lung apices are clear. Other: Visualized thyroid is mildly heterogeneous/nodular. IMPRESSION: No evidence of acute intracranial abnormality. Atrophy with small vessel ischemic changes. No evidence of traumatic injury to the cervical spine. Moderate multilevel degenerative changes. Electronically Signed   By: Julian Hy M.D.   On: 09/25/2018 17:52   Dg Hips Bilat W Or Wo Pelvis 5 Views  Result Date: 10/17/2018 CLINICAL DATA:  Bilateral hip pain after fall EXAM: DG HIP (WITH OR WITHOUT PELVIS) 5+V BILAT COMPARISON:  CT 11/26/2016 FINDINGS: Prior ORIF of the proximal left femur. Unchanged alignment of dynamic hip screw with the lag screw component superior laterally oriented relative to the femoral head. No perihardware lucency or fracture is evident. The hip joints remain intact without fracture or dislocation bones are diffusely demineralized. SI joints and pubic symphysis appear intact. IMPRESSION: 1. No acute osseous abnormality. 2. Unchanged alignment of left hip ORIF hardware. Electronically Signed   By: Davina Poke M.D.   On: 10/17/2018 18:09    Time Spent in minutes 35   Jaylyn Booher M.D on 05-Nov-2018 at 12:26 PM  Between 7am to 7pm - Pager - [phone]  After 7pm go to [email or web address] - password [email or web address]  [facility] -  Office  [phone]

## 2018-10-23 NOTE — ED Notes (Signed)
Patient transported to CT. Dr. Maudie Mercury speaking with the daughter.

## 2018-10-23 NOTE — Progress Notes (Signed)
  Echocardiogram 2D Echocardiogram has been performed.  Randa Lynn Markas [place] Oct 31, 2018, 12:25 PM

## 2018-10-23 DEATH — deceased
